# Patient Record
Sex: Male | Born: 1960 | Race: Black or African American | Hispanic: No | Marital: Single | Smoking: Never smoker
Health system: Southern US, Community
[De-identification: ages and names within clinical notes are randomized; demographics above are authoritative.]

## PROBLEM LIST (undated history)

## (undated) DIAGNOSIS — F209 Schizophrenia, unspecified: Secondary | ICD-10-CM

## (undated) DIAGNOSIS — B20 Human immunodeficiency virus [HIV] disease: Secondary | ICD-10-CM

## (undated) DIAGNOSIS — D649 Anemia, unspecified: Secondary | ICD-10-CM

## (undated) DIAGNOSIS — F79 Unspecified intellectual disabilities: Secondary | ICD-10-CM

## (undated) DIAGNOSIS — R569 Unspecified convulsions: Secondary | ICD-10-CM

## (undated) DIAGNOSIS — F259 Schizoaffective disorder, unspecified: Secondary | ICD-10-CM

## (undated) DIAGNOSIS — E870 Hyperosmolality and hypernatremia: Secondary | ICD-10-CM

## (undated) DIAGNOSIS — Z21 Asymptomatic human immunodeficiency virus [HIV] infection status: Secondary | ICD-10-CM

## (undated) DIAGNOSIS — N19 Unspecified kidney failure: Secondary | ICD-10-CM

## (undated) DIAGNOSIS — R739 Hyperglycemia, unspecified: Secondary | ICD-10-CM

## (undated) DIAGNOSIS — F329 Major depressive disorder, single episode, unspecified: Secondary | ICD-10-CM

## (undated) DIAGNOSIS — F319 Bipolar disorder, unspecified: Secondary | ICD-10-CM

## (undated) DIAGNOSIS — T56891A Toxic effect of other metals, accidental (unintentional), initial encounter: Secondary | ICD-10-CM

## (undated) DIAGNOSIS — F419 Anxiety disorder, unspecified: Secondary | ICD-10-CM

## (undated) HISTORY — PX: SKIN BIOPSY: SHX1

## (undated) HISTORY — DX: Hyperglycemia, unspecified: R73.9

## (undated) HISTORY — DX: Unspecified convulsions: R56.9

## (undated) HISTORY — DX: Schizoaffective disorder, unspecified: F25.9

---
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
## 2005-09-16 ENCOUNTER — Ambulatory Visit: Payer: Self-pay | Admitting: Family Medicine

## 2007-11-17 ENCOUNTER — Emergency Department: Payer: Self-pay | Admitting: Emergency Medicine

## 2007-11-17 ENCOUNTER — Other Ambulatory Visit: Payer: Self-pay

## 2008-04-06 ENCOUNTER — Emergency Department: Payer: Self-pay | Admitting: Emergency Medicine

## 2009-05-04 ENCOUNTER — Inpatient Hospital Stay (HOSPITAL_COMMUNITY): Admission: EM | Admit: 2009-05-04 | Discharge: 2009-05-11 | Payer: Self-pay | Admitting: Emergency Medicine

## 2009-05-27 ENCOUNTER — Emergency Department (HOSPITAL_COMMUNITY): Admission: EM | Admit: 2009-05-27 | Discharge: 2009-05-27 | Payer: Self-pay | Admitting: Emergency Medicine

## 2009-07-04 ENCOUNTER — Ambulatory Visit (HOSPITAL_COMMUNITY): Admission: RE | Admit: 2009-07-04 | Discharge: 2009-07-04 | Payer: Self-pay | Admitting: Family Medicine

## 2009-07-23 ENCOUNTER — Emergency Department (HOSPITAL_COMMUNITY): Admission: EM | Admit: 2009-07-23 | Discharge: 2009-07-23 | Payer: Self-pay | Admitting: Emergency Medicine

## 2010-06-25 LAB — DIFFERENTIAL
Basophils Absolute: 0 10*3/uL (ref 0.0–0.1)
Basophils Relative: 0 % (ref 0–1)
Lymphocytes Relative: 16 % (ref 12–46)
Lymphs Abs: 1.2 10*3/uL (ref 0.7–4.0)
Monocytes Relative: 9 % (ref 3–12)
Neutro Abs: 4.8 10*3/uL (ref 1.7–7.7)
Neutro Abs: 9.2 10*3/uL — ABNORMAL HIGH (ref 1.7–7.7)
Neutrophils Relative %: 69 % (ref 43–77)

## 2010-06-25 LAB — T-HELPER CELLS (CD4) COUNT (NOT AT ARMC)
CD4 % Helper T Cell: 26 % — ABNORMAL LOW (ref 33–55)
CD4 T Cell Abs: 530 uL (ref 400–2700)

## 2010-06-25 LAB — BASIC METABOLIC PANEL
Calcium: 8.8 mg/dL (ref 8.4–10.5)
GFR calc Af Amer: >60 mL/min (ref >60)
GFR calc non Af Amer: 48 mL/min — ABNORMAL LOW (ref >60)
GFR calc non Af Amer: >60 mL/min (ref >60)
GFR calc non Af Amer: >60 mL/min (ref >60)
Glucose, Bld: 167 mg/dL — ABNORMAL HIGH (ref 70–99)
Glucose, Bld: 225 mg/dL — ABNORMAL HIGH (ref 70–99)
Potassium: 3.7 mEq/L (ref 3.5–5.1)
Potassium: 3.8 mEq/L (ref 3.5–5.1)
Sodium: 135 mEq/L (ref 135–145)
Sodium: 135 mEq/L (ref 135–145)
Sodium: 136 mEq/L (ref 135–145)

## 2010-06-25 LAB — HEPATIC FUNCTION PANEL
Bilirubin, Direct: 0.2 mg/dL (ref 0.0–0.3)
Indirect Bilirubin: 1.1 mg/dL — ABNORMAL HIGH (ref 0.3–0.9)

## 2010-06-25 LAB — CBC
Hemoglobin: 13 g/dL (ref 13.0–17.0)
MCHC: 33.2 g/dL (ref 30.0–36.0)
Platelets: 140 10*3/uL — ABNORMAL LOW (ref 150–400)
RBC: 3.9 MIL/uL — ABNORMAL LOW (ref 4.22–5.81)
RDW: 13.7 % (ref 11.5–15.5)
WBC: 7 10*3/uL (ref 4.0–10.5)

## 2010-06-25 LAB — LACTATE DEHYDROGENASE: LDH: 147 U/L (ref 94–250)

## 2010-06-25 LAB — PSA: PSA: 0.59 ng/mL (ref 0.10–4.00)

## 2010-06-26 LAB — COMPREHENSIVE METABOLIC PANEL
ALT: 19 U/L (ref 0–53)
Alkaline Phosphatase: 68 U/L (ref 39–117)
BUN: 7 mg/dL (ref 6–23)
CO2: 28 mEq/L (ref 19–32)
Calcium: 8.9 mg/dL (ref 8.4–10.5)
Chloride: 103 mEq/L (ref 96–112)
GFR calc Af Amer: >60 mL/min (ref >60)
GFR calc non Af Amer: 58 mL/min — ABNORMAL LOW (ref >60)
Glucose, Bld: 120 mg/dL — ABNORMAL HIGH (ref 70–99)
Potassium: 3.8 mEq/L (ref 3.5–5.1)
Total Bilirubin: 1.3 mg/dL — ABNORMAL HIGH (ref 0.3–1.2)
Total Protein: 8 g/dL (ref 6.0–8.3)

## 2010-06-26 LAB — DIFFERENTIAL
Basophils Absolute: 0 10*3/uL (ref 0.0–0.1)
Basophils Relative: 0 % (ref 0–1)
Eosinophils Absolute: 0.3 10*3/uL (ref 0.0–0.7)
Lymphocytes Relative: 17 % (ref 12–46)
Monocytes Relative: 10 % (ref 3–12)
Neutro Abs: 5.4 10*3/uL (ref 1.7–7.7)
Neutrophils Relative %: 69 % (ref 43–77)

## 2010-06-26 LAB — CBC
HCT: 37 % — ABNORMAL LOW (ref 39.0–52.0)
Hemoglobin: 12.7 g/dL — ABNORMAL LOW (ref 13.0–17.0)
RBC: 4.07 MIL/uL — ABNORMAL LOW (ref 4.22–5.81)
RDW: 13.7 % (ref 11.5–15.5)

## 2010-06-28 LAB — DIFFERENTIAL
Basophils Absolute: 0 10*3/uL (ref 0.0–0.1)
Basophils Relative: 0 % (ref 0–1)
Lymphocytes Relative: 20 % (ref 12–46)
Monocytes Absolute: 0.8 10*3/uL (ref 0.1–1.0)
Monocytes Relative: 10 % (ref 3–12)
Neutro Abs: 5.3 10*3/uL (ref 1.7–7.7)
Neutrophils Relative %: 66 % (ref 43–77)

## 2010-06-28 LAB — BASIC METABOLIC PANEL
Calcium: 9.2 mg/dL (ref 8.4–10.5)
Creatinine, Ser: 1.2 mg/dL (ref 0.4–1.5)
GFR calc Af Amer: >60 mL/min (ref >60)
GFR calc non Af Amer: >60 mL/min (ref >60)
Sodium: 136 mEq/L (ref 135–145)

## 2010-06-28 LAB — CBC
Hemoglobin: 12.4 g/dL — ABNORMAL LOW (ref 13.0–17.0)
MCHC: 33.2 g/dL (ref 30.0–36.0)
RBC: 4.08 MIL/uL — ABNORMAL LOW (ref 4.22–5.81)
RDW: 13.4 % (ref 11.5–15.5)

## 2011-04-30 ENCOUNTER — Other Ambulatory Visit (HOSPITAL_COMMUNITY): Payer: Self-pay | Admitting: Family Medicine

## 2011-04-30 DIAGNOSIS — I639 Cerebral infarction, unspecified: Secondary | ICD-10-CM

## 2011-05-01 ENCOUNTER — Ambulatory Visit (HOSPITAL_COMMUNITY)
Admission: RE | Admit: 2011-05-01 | Discharge: 2011-05-01 | Disposition: A | Discharge status: Home / Self Care | Payer: PRIVATE HEALTH INSURANCE | Source: Ambulatory Visit | Attending: Family Medicine | Admitting: Family Medicine

## 2011-05-01 DIAGNOSIS — I6350 Cerebral infarction due to unspecified occlusion or stenosis of unspecified cerebral artery: Secondary | ICD-10-CM | POA: Insufficient documentation

## 2011-05-01 DIAGNOSIS — I639 Cerebral infarction, unspecified: Secondary | ICD-10-CM

## 2011-05-05 ENCOUNTER — Emergency Department (HOSPITAL_COMMUNITY): Payer: Medicare Other

## 2011-05-05 ENCOUNTER — Other Ambulatory Visit: Payer: Self-pay

## 2011-05-05 ENCOUNTER — Encounter (HOSPITAL_COMMUNITY): Payer: Self-pay | Admitting: *Deleted

## 2011-05-05 ENCOUNTER — Inpatient Hospital Stay (HOSPITAL_COMMUNITY)
Admission: EM | Admit: 2011-05-05 | Discharge: 2011-05-15 | DRG: 683 | Disposition: A | Discharge status: Nursing Home (Includes ICF) | Payer: Medicare Other | Attending: Family Medicine | Admitting: Family Medicine

## 2011-05-05 DIAGNOSIS — N179 Acute kidney failure, unspecified: Principal | ICD-10-CM | POA: Diagnosis present

## 2011-05-05 DIAGNOSIS — E875 Hyperkalemia: Secondary | ICD-10-CM | POA: Diagnosis present

## 2011-05-05 DIAGNOSIS — R270 Ataxia, unspecified: Secondary | ICD-10-CM

## 2011-05-05 DIAGNOSIS — E870 Hyperosmolality and hypernatremia: Secondary | ICD-10-CM | POA: Diagnosis not present

## 2011-05-05 DIAGNOSIS — N251 Nephrogenic diabetes insipidus: Secondary | ICD-10-CM | POA: Diagnosis not present

## 2011-05-05 DIAGNOSIS — Z21 Asymptomatic human immunodeficiency virus [HIV] infection status: Secondary | ICD-10-CM | POA: Diagnosis present

## 2011-05-05 DIAGNOSIS — F329 Major depressive disorder, single episode, unspecified: Secondary | ICD-10-CM | POA: Diagnosis present

## 2011-05-05 DIAGNOSIS — Z79899 Other long term (current) drug therapy: Secondary | ICD-10-CM

## 2011-05-05 DIAGNOSIS — R4182 Altered mental status, unspecified: Secondary | ICD-10-CM

## 2011-05-05 DIAGNOSIS — E1129 Type 2 diabetes mellitus with other diabetic kidney complication: Secondary | ICD-10-CM | POA: Diagnosis present

## 2011-05-05 DIAGNOSIS — N2581 Secondary hyperparathyroidism of renal origin: Secondary | ICD-10-CM | POA: Diagnosis not present

## 2011-05-05 DIAGNOSIS — F79 Unspecified intellectual disabilities: Secondary | ICD-10-CM | POA: Diagnosis present

## 2011-05-05 DIAGNOSIS — T438X5A Adverse effect of other psychotropic drugs, initial encounter: Secondary | ICD-10-CM | POA: Diagnosis present

## 2011-05-05 DIAGNOSIS — F209 Schizophrenia, unspecified: Secondary | ICD-10-CM | POA: Diagnosis present

## 2011-05-05 DIAGNOSIS — I129 Hypertensive chronic kidney disease with stage 1 through stage 4 chronic kidney disease, or unspecified chronic kidney disease: Secondary | ICD-10-CM | POA: Diagnosis present

## 2011-05-05 DIAGNOSIS — D638 Anemia in other chronic diseases classified elsewhere: Secondary | ICD-10-CM | POA: Diagnosis present

## 2011-05-05 DIAGNOSIS — N19 Unspecified kidney failure: Secondary | ICD-10-CM

## 2011-05-05 DIAGNOSIS — N183 Chronic kidney disease, stage 3 unspecified: Secondary | ICD-10-CM | POA: Diagnosis present

## 2011-05-05 DIAGNOSIS — F319 Bipolar disorder, unspecified: Secondary | ICD-10-CM | POA: Diagnosis present

## 2011-05-05 DIAGNOSIS — I639 Cerebral infarction, unspecified: Secondary | ICD-10-CM

## 2011-05-05 HISTORY — DX: Anxiety disorder, unspecified: F41.9

## 2011-05-05 HISTORY — DX: Anemia, unspecified: D64.9

## 2011-05-05 HISTORY — DX: Asymptomatic human immunodeficiency virus (hiv) infection status: Z21

## 2011-05-05 HISTORY — DX: Bipolar disorder, unspecified: F31.9

## 2011-05-05 HISTORY — DX: Unspecified intellectual disabilities: F79

## 2011-05-05 HISTORY — DX: Schizophrenia, unspecified: F20.9

## 2011-05-05 HISTORY — DX: Major depressive disorder, single episode, unspecified: F32.9

## 2011-05-05 HISTORY — DX: Human immunodeficiency virus (HIV) disease: B20

## 2011-05-05 LAB — URINALYSIS, ROUTINE W REFLEX MICROSCOPIC
Bilirubin Urine: NEGATIVE
Glucose, UA: NEGATIVE mg/dL
Ketones, ur: NEGATIVE mg/dL
Protein, ur: 30 mg/dL — AB
pH: 7 (ref 5.0–8.0)

## 2011-05-05 LAB — URINE MICROSCOPIC-ADD ON

## 2011-05-05 LAB — DIFFERENTIAL
Basophils Relative: 0 % (ref 0–1)
Eosinophils Absolute: 0.3 10*3/uL (ref 0.0–0.7)
Eosinophils Relative: 2 % (ref 0–5)
Lymphs Abs: 1.5 10*3/uL (ref 0.7–4.0)
Monocytes Relative: 8 % (ref 3–12)
Neutrophils Relative %: 77 % (ref 43–77)

## 2011-05-05 LAB — HEPATIC FUNCTION PANEL
ALT: 10 U/L (ref 0–53)
AST: 10 U/L (ref 0–37)
Albumin: 3.4 g/dL — ABNORMAL LOW (ref 3.5–5.2)
Alkaline Phosphatase: 83 U/L (ref 39–117)
Bilirubin, Direct: 0.1 mg/dL (ref 0.0–0.3)
Indirect Bilirubin: 0.3 mg/dL (ref 0.3–0.9)
Total Bilirubin: 0.4 mg/dL (ref 0.3–1.2)
Total Protein: 8.2 g/dL (ref 6.0–8.3)

## 2011-05-05 LAB — CBC
MCH: 30.3 pg (ref 26.0–34.0)
MCHC: 31.7 g/dL (ref 30.0–36.0)
MCV: 95.8 fL (ref 78.0–100.0)
Platelets: 197 10*3/uL (ref 150–400)
RBC: 3.33 MIL/uL — ABNORMAL LOW (ref 4.22–5.81)

## 2011-05-05 LAB — BASIC METABOLIC PANEL
BUN: 58 mg/dL — ABNORMAL HIGH (ref 6–23)
Calcium: 8.8 mg/dL (ref 8.4–10.5)
GFR calc Af Amer: 9 mL/min — ABNORMAL LOW (ref >90)
GFR calc non Af Amer: 8 mL/min — ABNORMAL LOW (ref >90)
Glucose, Bld: 91 mg/dL (ref 70–99)
Potassium: 5.1 mEq/L (ref 3.5–5.1)
Sodium: 137 mEq/L (ref 135–145)

## 2011-05-05 LAB — PHOSPHORUS: Phosphorus: 4.6 mg/dL (ref 2.3–4.6)

## 2011-05-05 MED ORDER — ENOXAPARIN SODIUM 30 MG/0.3ML ~~LOC~~ SOLN
30.0000 mg | SUBCUTANEOUS | Status: DC
  Administered 2011-05-05 – 2011-05-11 (×7): 30 mg via SUBCUTANEOUS
  Filled 2011-05-05 (×7): qty 0.3

## 2011-05-05 MED ORDER — SODIUM CHLORIDE 0.9 % IV SOLN
INTRAVENOUS | Status: DC
  Administered 2011-05-05 – 2011-05-06 (×2): via INTRAVENOUS

## 2011-05-05 MED ORDER — EMTRICITABINE-TENOFOVIR DF 200-300 MG PO TABS
1.0000 | ORAL_TABLET | Freq: Every day | ORAL | Status: DC
  Administered 2011-05-05 – 2011-05-15 (×11): 1 via ORAL
  Filled 2011-05-05 (×13): qty 1

## 2011-05-05 MED ORDER — DIVALPROEX SODIUM ER 500 MG PO TB24
1000.0000 mg | ORAL_TABLET | Freq: Every day | ORAL | Status: DC
  Administered 2011-05-05: 1000 mg via ORAL
  Filled 2011-05-05 (×2): qty 2

## 2011-05-05 MED ORDER — OLANZAPINE 5 MG PO TABS
20.0000 mg | ORAL_TABLET | Freq: Every day | ORAL | Status: DC
  Administered 2011-05-05 – 2011-05-14 (×10): 20 mg via ORAL
  Filled 2011-05-05 (×7): qty 4
  Filled 2011-05-05: qty 2
  Filled 2011-05-05 (×2): qty 4
  Filled 2011-05-05: qty 2

## 2011-05-05 MED ORDER — ACETAMINOPHEN 325 MG PO TABS: 650.0000 mg | ORAL_TABLET | Freq: Four times a day (QID) | ORAL | Status: DC | PRN

## 2011-05-05 MED ORDER — SODIUM CHLORIDE 0.9 % IV SOLN
Freq: Once | INTRAVENOUS | Status: AC
  Administered 2011-05-05: 2000 mL via INTRAVENOUS

## 2011-05-05 MED ORDER — LITHIUM CARBONATE ER 450 MG PO TBCR: 450.0000 mg | EXTENDED_RELEASE_TABLET | Freq: Two times a day (BID) | ORAL | Status: DC

## 2011-05-05 MED ORDER — FAMOTIDINE 20 MG PO TABS
20.0000 mg | ORAL_TABLET | Freq: Every day | ORAL | Status: DC
  Administered 2011-05-05 – 2011-05-15 (×11): 20 mg via ORAL
  Filled 2011-05-05 (×11): qty 1

## 2011-05-05 MED ORDER — CLONAZEPAM 0.5 MG PO TABS
2.0000 mg | ORAL_TABLET | Freq: Every day | ORAL | Status: DC
  Administered 2011-05-05 – 2011-05-14 (×10): 2 mg via ORAL
  Filled 2011-05-05 (×10): qty 4

## 2011-05-05 MED ORDER — ONDANSETRON HCL 4 MG PO TABS: 4.0000 mg | ORAL_TABLET | Freq: Four times a day (QID) | ORAL | Status: DC | PRN

## 2011-05-05 MED ORDER — ONDANSETRON HCL 4 MG/2ML IJ SOLN: 4.0000 mg | Freq: Four times a day (QID) | INTRAMUSCULAR | Status: DC | PRN

## 2011-05-05 MED ORDER — ACETAMINOPHEN 650 MG RE SUPP: 650.0000 mg | Freq: Four times a day (QID) | RECTAL | Status: DC | PRN

## 2011-05-05 MED ORDER — DIVALPROEX SODIUM ER 500 MG PO TB24
500.0000 mg | ORAL_TABLET | Freq: Every day | ORAL | Status: DC
  Administered 2011-05-05 – 2011-05-15 (×11): 500 mg via ORAL
  Filled 2011-05-05 (×13): qty 1

## 2011-05-05 MED ORDER — SODIUM CHLORIDE 0.9 % IV SOLN
Freq: Once | INTRAVENOUS | Status: AC
  Administered 2011-05-05: 10:00:00 via INTRAVENOUS

## 2011-05-05 MED ORDER — ALUM & MAG HYDROXIDE-SIMETH 200-200-20 MG/5ML PO SUSP: 30.0000 mL | Freq: Four times a day (QID) | ORAL | Status: DC | PRN

## 2011-05-05 MED ORDER — TAMSULOSIN HCL 0.4 MG PO CAPS
0.4000 mg | ORAL_CAPSULE | Freq: Every day | ORAL | Status: DC
  Administered 2011-05-05 – 2011-05-15 (×11): 0.4 mg via ORAL
  Filled 2011-05-05 (×11): qty 1

## 2011-05-05 MED ORDER — RITONAVIR 100 MG PO TABS
100.0000 mg | ORAL_TABLET | Freq: Every day | ORAL | Status: DC
  Administered 2011-05-06 – 2011-05-15 (×10): 100 mg via ORAL
  Filled 2011-05-05 (×12): qty 1

## 2011-05-05 MED ORDER — ATAZANAVIR SULFATE 150 MG PO CAPS
300.0000 mg | ORAL_CAPSULE | Freq: Every day | ORAL | Status: DC
  Administered 2011-05-06 – 2011-05-15 (×10): 300 mg via ORAL
  Filled 2011-05-05 (×14): qty 2

## 2011-05-05 MED ORDER — DIVALPROEX SODIUM ER 500 MG PO TB24: 500.0000 mg | ORAL_TABLET | Freq: Two times a day (BID) | ORAL | Status: DC

## 2011-05-05 NOTE — H&P (Signed)
[name], [name]             ACCOUNT NO.:  [id number]  MEDICAL RECORD NO.:  [id number]  LOCATION:  A331                          FACILITY:  [facility]  PHYSICIAN:  Kingsley Callander. Ouida Sills, MD       DATE OF BIRTH:  04-27-1960  DATE OF ADMISSION:  05/05/2011 DATE OF DISCHARGE:  LH                             HISTORY & PHYSICAL   HISTORY OF PRESENT ILLNESS:  This patient is a 51 year old, African American male, who presented to the emergency room after he had developed increased difficulty speaking and increased difficulty walking over the past 2 days.  He had reportedly had a right facial droop.  He was initially evaluated in the emergency room and underwent a CT scan of the brain which revealed no evidence of acute infarct.  The patient had difficulty providing any meaningful history.  He has a history of HIV as well as mental retardation, and 3 listed other psychiatric diagnoses here in the chart of schizophrenia, bipolar disorder, and depression. He was found to be on lithium and Depakote.  Levels were obtained revealing a toxic lithium level of 2.47 and a Depakote level of 49.5. He was initially hypotensive with systolic pressures in the 80s, but has responded with IV hydration with normal saline to a systolic pressure in the 130 range.  He was also found to be in kidney failure with a BUN and creatinine of 58 and 7.36, compared to a BUN and creatinine of 7 and 1.3 last April.  He had undergone Foley catheterization at which time, 450 mL of urine was drained.  PAST MEDICAL HISTORY: 1. HIV. 2. Mental retardation. 3. Anemia. 4. Schizophrenia, depression, and bipolar disorder listed.  MEDICATIONS: 1. Reyataz 300 mg daily. 2. Klonopin 2 mg at bedtime. 3. Depakote ER 500 mg q.a.m. and 1000 mg at bedtime. 4. Truvada 200-300 mg tablet daily. 5. Ferrous sulfate 325 mg t.i.d. 6. Gabapentin 600 mg b.i.d. 7. Lisinopril 10 mg daily. 8. Lithium carbonate 450 mg b.i.d. 9. Zyprexa 20 mg at  bedtime. 10.Ranitidine 150 mg b.i.d. 11.Norvir 100 mg daily. 12.Tamsulosin 0.4 mg daily.  ALLERGIES:  None known.  SOCIAL HISTORY:  Tobacco, alcohol, and drug use denied.  FAMILY HISTORY:  Unknown.  REVIEW OF SYSTEMS:  Unobtainable.  PHYSICAL EXAMINATION:  VITAL SIGNS:  Temperature 98.6, pulse 84, respirations 25, blood pressure 134/59, after first blood pressure of 81/42, oxygen saturation 100% on room air. GENERAL:  Sluggish male in no distress. HEENT:  Eyes, nose, and pharynx are unremarkable.  Mucous membranes are moist. NECK:  No JVD or thyromegaly. LUNGS:  Clear. HEART:  Regular with no murmurs. ABDOMEN:  Soft, nontender with no hepatosplenomegaly. EXTREMITIES:  No cyanosis, clubbing, or edema. NEUROLOGIC:  He is able to move all 4 extremities and has no focal weakness.  His face is symmetric.  His speech is slowed, although his baseline status is not known. SKIN:  Warm and dry. LYMPH NODES:  No cervical or supraclavicular enlargement.  LABORATORY DATA:  Sodium 137, potassium 5.1, bicarb 25, BUN 58, creatinine 7.36, calcium 8.8, glucose 91, phosphorus 4.6, magnesium 3.3, albumin 3.4, AST 10, ALT 10, ammonia 38, lactic acid 1.1.  White count 11.9, hemoglobin 10.1,  platelets 197,000.  Lithium level 2.47.  Valproic acid level 49.5.  Urinalysis reveals a pH of 1.010 with 30 mg/dL of protein.  Chest x-ray reveals bilateral hazy lung opacities.  CT scan of the head reveals no acute abnormality with left frontal sinus opacification and comparison is available from May 01, 2011.  No changes noted.  IMPRESSION AND PLAN: 1. Lithium toxicity.  He will be hospitalized in a monitored setting.     Lithium will be held.  A repeat lithium level will be obtained     tomorrow. 2. Kidney failure.  A renal ultrasound will be obtained.  Lisinopril     will be held. 3. Normocytic anemia. 4. Human immunodeficiency virus. 5. Mental retardation. 6. Schizophrenia versus bipolar  disorder. 7. Abnormal chest x-ray.  He is not dyspneic or short of breath.  We     will follow closely with hydration. 8. We will also hold ferrous sulfate and gabapentin along with the     lisinopril for now.  A Foley catheter is in place to monitor his     urine output, which has thus far been good.  Holding his lithium     dose, he will have improvement in his neurological status, and in     his renal function.  Further evaluation tomorrow with his personal     physician, Dr. Janna Arch.     Kingsley Callander. Ouida Sills, MD     ROF/MEDQ  D:  05/05/2011  T:  05/05/2011  Job:  [id number]

## 2011-05-05 NOTE — ED Notes (Signed)
Report given to [facility] RN unit 300 ready to receive patient.

## 2011-05-05 NOTE — ED Provider Notes (Signed)
History     CSN: [id number]  Arrival date & time 05/05/11  5956   First MD Initiated Contact with Patient 05/05/11 [phone]      Chief Complaint  Patient presents with  . possible stroke     (Consider location/radiation/quality/duration/timing/severity/associated sxs/prior treatment) HPI Comments: Per the LPN caregiver that accompanied from the assisted livingcenter where he lives, he is having a difficult time speaking and walking.  Staff attempted getting him to the ED yest without success.  No head trauma.  He has multiple psych disorders.  He was reportedly last seen normal yest AM or as long ago as 6 days ago.  PCP= dr Janna Arch  The history is provided by a caregiver. No language interpreter was used.    Past Medical History  Diagnosis Date  . HIV (human immunodeficiency virus infection)   . Schizophrenia   . MR (mental retardation)   . Anemia   . Anxiety   . Bipolar 1 disorder   . Depression, major     History reviewed. No pertinent past surgical history.  History reviewed. No pertinent family history.  History  Substance Use Topics  . Smoking status: Former Games developer  . Smokeless tobacco: Not on file  . Alcohol Use: No      Review of Systems  Neurological: Positive for speech difficulty. Negative for seizures and headaches.       Has difficulty ambulating.    Allergies  Review of patient's allergies indicates no known allergies.  Home Medications  No current outpatient prescriptions on file.  BP 92/52  Pulse 72  Temp(Src) 98.6 F (37 C) (Oral)  Resp 22  SpO2 97%  Physical Exam  Nursing note and vitals reviewed. Constitutional: He is oriented to person, place, and time. He appears well-developed and well-nourished.  HENT:  Head: Normocephalic and atraumatic.    Right Ear: External ear normal.  Left Ear: External ear normal.  Nose: Nose normal.  Mouth/Throat: No oropharyngeal exudate.  Eyes: EOM are normal. Pupils are equal, round, and reactive  to light. Right eye exhibits no discharge. Left eye exhibits no discharge. No scleral icterus.  Neck: Normal range of motion.  Cardiovascular: Normal rate, regular rhythm, normal heart sounds and intact distal pulses.  PMI is not displaced.   Pulmonary/Chest: Effort normal and breath sounds normal. No accessory muscle usage. Not tachypneic. No respiratory distress. He has no decreased breath sounds. He has no wheezes. He has no rhonchi. He has no rales.  Abdominal: Soft. Normal appearance and bowel sounds are normal. He exhibits no distension. There is no tenderness. There is no CVA tenderness.  Musculoskeletal: Normal range of motion.  Neurological: He is alert and oriented to person, place, and time. He has normal strength. Coordination abnormal. GCS eye subscore is 3. GCS verbal subscore is 4. GCS motor subscore is 6.  Reflex Scores:      Bicep reflexes are 2+ on the right side and 2+ on the left side.      Brachioradialis reflexes are 2+ on the right side and 2+ on the left side.      Patellar reflexes are 2+ on the right side and 2+ on the left side.      Achilles reflexes are 2+ on the right side and 2+ on the left side. Skin: Skin is warm and dry.  Psychiatric: He has a normal mood and affect. Judgment normal.    ED Course  Procedures (including critical care time)  Labs Reviewed - No data to  display No results found.   No diagnosis found.    MDM    Date: 05/05/2011  Rate: 68  Rhythm: normal sinus rhythm  QRS Axis: normal  Intervals: PR prolonged  ST/T Wave abnormalities: normal  Conduction Disutrbances:first-degree A-V block   Narrative Interpretation:   Old EKG Reviewed: none available       1220-spoke with dr. Ouida Sills.  Will come  See admit.  Worthy Rancher, PA 05/05/11 1230

## 2011-05-05 NOTE — ED Notes (Signed)
Patient given water and coke per request. Passed swallow screen. Dr Ouida Sills currently at bedside for patient evaluation.

## 2011-05-05 NOTE — ED Notes (Signed)
Pts care giver states pt has been slurring his speech and walking unsteady since Wednesday. Pts right side lip drooping. Caregiver states mental status not normal.

## 2011-05-05 NOTE — ED Notes (Signed)
Patient lying in bed with eyes closed. Respirations even and unlabored. Patient arouses to voice.

## 2011-05-05 NOTE — ED Notes (Signed)
CRITICAL VALUE ALERT  Critical value received:  Lithium level 2.47  Date of notification: 05/05/2011  Time of notification:  0438  Critical value read back:yes  Nurse who received alert:  Tarri Glenn RN  MD notified (1st page):  Dr Ouida Sills  Time of first page:  1443  MD notified (2nd page):  Time of second page:  Responding MD:  Dr Ouida Sills  Time MD responded:  [phone]

## 2011-05-06 ENCOUNTER — Inpatient Hospital Stay (HOSPITAL_COMMUNITY): Payer: Medicare Other

## 2011-05-06 ENCOUNTER — Other Ambulatory Visit (HOSPITAL_COMMUNITY): Payer: Self-pay

## 2011-05-06 LAB — RETICULOCYTES
RBC.: 3.19 MIL/uL — ABNORMAL LOW (ref 4.22–5.81)
Retic Count, Absolute: 41.5 10*3/uL (ref 19.0–186.0)
Retic Ct Pct: 1.3 % (ref 0.4–3.1)

## 2011-05-06 LAB — COMPREHENSIVE METABOLIC PANEL
BUN: 45 mg/dL — ABNORMAL HIGH (ref 6–23)
CO2: 22 mEq/L (ref 19–32)
Calcium: 8.4 mg/dL (ref 8.4–10.5)
Chloride: 118 mEq/L — ABNORMAL HIGH (ref 96–112)
Creatinine, Ser: 6.02 mg/dL — ABNORMAL HIGH (ref 0.50–1.35)
GFR calc Af Amer: 11 mL/min — ABNORMAL LOW (ref >90)
GFR calc non Af Amer: 10 mL/min — ABNORMAL LOW (ref >90)
Total Bilirubin: 0.3 mg/dL (ref 0.3–1.2)

## 2011-05-06 LAB — AMMONIA: Ammonia: 56 umol/L (ref 11–60)

## 2011-05-06 LAB — GLUCOSE, CAPILLARY
Glucose-Capillary: 84 mg/dL (ref 70–99)
Glucose-Capillary: 107 mg/dL — ABNORMAL HIGH (ref 70–99)

## 2011-05-06 MED ORDER — SODIUM CHLORIDE 0.9 % IJ SOLN
INTRAMUSCULAR | Status: AC
  Filled 2011-05-06: qty 3

## 2011-05-06 MED ORDER — SODIUM CHLORIDE 0.9 % IV SOLN: INTRAVENOUS | Status: DC

## 2011-05-06 MED ORDER — SODIUM CHLORIDE 0.45 % IV SOLN
INTRAVENOUS | Status: DC
  Administered 2011-05-06: 18:00:00 via INTRAVENOUS
  Filled 2011-05-06 (×4): qty 50

## 2011-05-06 NOTE — Progress Notes (Signed)
CRITICAL VALUE ALERT  Critical value received:  Lithium 2.22  Date of notification:  05/06/11  Time of notification:  0625  Critical value read back: yes  Nurse who received alert:  Foye Deer RN  MD notified (1st page):  Dr. Ouida Sills  Time of first page:  0626  MD notified (2nd page):  Time of second page:  Responding MD:  Dr. Ouida Sills  Time MD responded:  [phone]  Lab value going down - no new orders

## 2011-05-06 NOTE — Consult Note (Signed)
Reason for Consult: Acute kidney injury Referring Physician: Dr. Hillery Aldo Nathan [name] is an 51 y.o. male.  HPI: Patient with  history of schizophrenia, bipolar disorder major depression and HIV positive presently had came to the hospital because of  change in mental status. At this moment patient is awake he doesn't offer no complaints. presently very difficult to assess patient because the no family member is available and not sure what his baseline mental status is. But from the documentation patient also seems to have some sort of mental retardation. Hence this could be possibly would multifactorial  Past Medical History  Diagnosis Date  . HIV (human immunodeficiency virus infection)   . Schizophrenia   . MR (mental retardation)   . Anemia   . Anxiety   . Bipolar 1 disorder   . Depression, major     History reviewed. No pertinent past surgical history.  History reviewed. No pertinent family history.  Social History:  reports that he has quit smoking. He does not have any smokeless tobacco history on file. He reports that he does not drink alcohol or use illicit drugs.  Allergies: No Known Allergies  Medications: I have reviewed the patient's current medications.  Results for orders placed during the hospital encounter of 05/05/11 (from the past 48 hour(s))  CBC     Status: Abnormal   Collection Time   05/05/11  9:54 AM      Component Value Range Comment   WBC 11.9 (*) 4.0 - 10.5 (K/uL)    RBC 3.33 (*) 4.22 - 5.81 (MIL/uL)    Hemoglobin 10.1 (*) 13.0 - 17.0 (g/dL)    HCT 08.6 (*) 57.8 - 52.0 (%)    MCV 95.8  78.0 - 100.0 (fL)    MCH 30.3  26.0 - 34.0 (pg)    MCHC 31.7  30.0 - 36.0 (g/dL)    RDW 46.9  62.9 - 52.8 (%)    Platelets 197  150 - 400 (K/uL)   DIFFERENTIAL     Status: Abnormal   Collection Time   05/05/11  9:54 AM      Component Value Range Comment   Neutrophils Relative 77  43 - 77 (%)    Neutro Abs 9.2 (*) 1.7 - 7.7 (K/uL)    Lymphocytes Relative 13  12  - 46 (%)    Lymphs Abs 1.5  0.7 - 4.0 (K/uL)    Monocytes Relative 8  3 - 12 (%)    Monocytes Absolute 0.9  0.1 - 1.0 (K/uL)    Eosinophils Relative 2  0 - 5 (%)    Eosinophils Absolute 0.3  0.0 - 0.7 (K/uL)    Basophils Relative 0  0 - 1 (%)    Basophils Absolute 0.0  0.0 - 0.1 (K/uL)   BASIC METABOLIC PANEL     Status: Abnormal   Collection Time   05/05/11  9:54 AM      Component Value Range Comment   Sodium 137  135 - 145 (mEq/L)    Potassium 5.1  3.5 - 5.1 (mEq/L)    Chloride 107  96 - 112 (mEq/L)    CO2 25  19 - 32 (mEq/L)    Glucose, Bld 91  70 - 99 (mg/dL)    BUN 58 (*) 6 - 23 (mg/dL)    Creatinine, Ser 4.13 (*) 0.50 - 1.35 (mg/dL)    Calcium 8.8  8.4 - 10.5 (mg/dL)    GFR calc non Af Amer 8 (*) >90 (mL/min)  GFR calc Af Amer 9 (*) >90 (mL/min)   PHOSPHORUS     Status: Normal   Collection Time   05/05/11  9:54 AM      Component Value Range Comment   Phosphorus 4.6  2.3 - 4.6 (mg/dL)   MAGNESIUM     Status: Abnormal   Collection Time   05/05/11  9:54 AM      Component Value Range Comment   Magnesium 3.3 (*) 1.5 - 2.5 (mg/dL)   VALPROIC ACID LEVEL     Status: Abnormal   Collection Time   05/05/11  9:54 AM      Component Value Range Comment   Valproic Acid Lvl 49.5 (*) 50.0 - 100.0 (ug/mL)   HEPATIC FUNCTION PANEL     Status: Abnormal   Collection Time   05/05/11  9:54 AM      Component Value Range Comment   Total Protein 8.2  6.0 - 8.3 (g/dL)    Albumin 3.4 (*) 3.5 - 5.2 (g/dL)    AST 10  0 - 37 (U/L)    ALT 10  0 - 53 (U/L)    Alkaline Phosphatase 83  39 - 117 (U/L)    Total Bilirubin 0.4  0.3 - 1.2 (mg/dL)    Bilirubin, Direct 0.1  0.0 - 0.3 (mg/dL) REPEATED TO VERIFY   Indirect Bilirubin 0.3  0.3 - 0.9 (mg/dL)   LACTIC ACID, PLASMA     Status: Normal   Collection Time   05/05/11 10:00 AM      Component Value Range Comment   Lactic Acid, Venous 1.1  0.5 - 2.2 (mmol/L)   AMMONIA     Status: Normal   Collection Time   05/05/11 10:05 AM      Component Value  Range Comment   Ammonia 38  11 - 60 (umol/L)   URINALYSIS, ROUTINE W REFLEX MICROSCOPIC     Status: Abnormal   Collection Time   05/05/11  1:09 PM      Component Value Range Comment   Color, Urine STRAW (*) YELLOW     APPearance CLEAR  CLEAR     Specific Gravity, Urine 1.010  1.005 - 1.030     pH 7.0  5.0 - 8.0     Glucose, UA NEGATIVE  NEGATIVE (mg/dL)    Hgb urine dipstick SMALL (*) NEGATIVE     Bilirubin Urine NEGATIVE  NEGATIVE     Ketones, ur NEGATIVE  NEGATIVE (mg/dL)    Protein, ur 30 (*) NEGATIVE (mg/dL)    Urobilinogen, UA 0.2  0.0 - 1.0 (mg/dL)    Nitrite NEGATIVE  NEGATIVE     Leukocytes, UA NEGATIVE  NEGATIVE    URINE MICROSCOPIC-ADD ON     Status: Normal   Collection Time   05/05/11  1:09 PM      Component Value Range Comment   WBC, UA 0-2  <3 (WBC/hpf)    RBC / HPF 0-2  <3 (RBC/hpf)   LITHIUM LEVEL     Status: Abnormal   Collection Time   05/05/11  1:48 PM      Component Value Range Comment   Lithium Lvl 2.47 (*) 0.80 - 1.40 (mEq/L)   MRSA PCR SCREENING     Status: Normal   Collection Time   05/05/11  5:00 PM      Component Value Range Comment   MRSA by PCR NEGATIVE  NEGATIVE    LITHIUM LEVEL     Status: Abnormal   Collection Time  05/06/11  5:23 AM      Component Value Range Comment   Lithium Lvl 2.22 (*) 0.80 - 1.40 (mEq/L)   COMPREHENSIVE METABOLIC PANEL     Status: Abnormal   Collection Time   05/06/11  9:31 AM      Component Value Range Comment   Sodium 144  135 - 145 (mEq/L) DELTA CHECK NOTED   Potassium 5.4 (*) 3.5 - 5.1 (mEq/L)    Chloride 118 (*) 96 - 112 (mEq/L)    CO2 22  19 - 32 (mEq/L)    Glucose, Bld 90  70 - 99 (mg/dL)    BUN 45 (*) 6 - 23 (mg/dL)    Creatinine, Ser 4.54 (*) 0.50 - 1.35 (mg/dL)    Calcium 8.4  8.4 - 10.5 (mg/dL)    Total Protein 7.0  6.0 - 8.3 (g/dL)    Albumin 2.8 (*) 3.5 - 5.2 (g/dL)    AST 9  0 - 37 (U/L)    ALT 8  0 - 53 (U/L)    Alkaline Phosphatase 75  39 - 117 (U/L)    Total Bilirubin 0.3  0.3 - 1.2 (mg/dL)     GFR calc non Af Amer 10 (*) >90 (mL/min)    GFR calc Af Amer 11 (*) >90 (mL/min)   AMMONIA     Status: Normal   Collection Time   05/06/11  2:27 PM      Component Value Range Comment   Ammonia 56  11 - 60 (umol/L)   RETICULOCYTES     Status: Abnormal   Collection Time   05/06/11  2:27 PM      Component Value Range Comment   Retic Ct Pct 1.3  0.4 - 3.1 (%)    RBC. 3.19 (*) 4.22 - 5.81 (MIL/uL)    Retic Count, Manual 41.5  19.0 - 186.0 (K/uL)     X-ray Chest Pa And Lateral   05/05/2011  *RADIOLOGY REPORT*  Clinical Data: Renal failure  CHEST - 2 VIEW  Comparison: 07/04/2009  Findings: Heart size is normal.  There is no pleural effusion identified.  Bilateral hazy lung opacities are identified.  The visualized osseous structures appear unremarkable.  IMPRESSION:  1.  Bilateral hazy lung opacities.  In the setting of renal failure this likely represents edema.  Original Report Authenticated By: Rosealee Albee, M.D.   Ct Head Wo Contrast  05/05/2011  *RADIOLOGY REPORT*  Clinical Data: Altered level of consciousness, dysphagia,  CT HEAD WITHOUT CONTRAST  Technique:  Contiguous axial images were obtained from the base of the skull through the vertex without contrast.  Comparison: 05/01/2011  Findings:  The gray-white differentiation is maintained.  No CT evidence of acute large territory infarct.  No intraparenchymal or extra-axial mass or hemorrhage.  Unchanged size and configuration of the ventricles and basilar cisterns.  There is unchanged calcification about the posterior midline falx.  There is minimal mucosal thickening within the right maxillary sinus. Unchanged opacification of the left frontal sinus.  The remaining paranasal sinuses and mastoid air cells are patent.  Regional soft tissues are normal.  Normal appearance of the orbits.  IMPRESSION: 1.  No acute intracranial disease.  2.  Left frontal sinus opacification.  Original Report Authenticated By: Waynard Reeds, M.D.   US  Renal  05/06/2011  *RADIOLOGY REPORT*  Clinical Data: 51 year old male with renal failure.  RENAL/URINARY TRACT ULTRASOUND COMPLETE  Comparison:  None  Findings:  Right Kidney:  The right kidney is  upper limits of normal in echogenicity measuring 11.7 cm.  There is no evidence of hydronephrosis, solid renal mass or definite renal calculi.  Left Kidney:  The left kidney is upper limits of normal in echogenicity measuring 10.3 cm.  There is no evidence of hydronephrosis, solid renal mass or definite renal calculi.  Bladder:  The bladder is collapsed and contains a Foley catheter.  IMPRESSION: Upper limits of normal renal echogenicity which may reflect medical renal disease.  No evidence of hydronephrosis.  Foley catheter within bladder.  Original Report Authenticated By: Rosendo Gros, M.D.    Review of Systems  Unable to perform ROS: psychiatric disorder   Blood pressure 104/69, pulse 71, temperature 98.6 F (37 C), temperature source Oral, resp. rate 18, height 5\' 9"  (1.753 m), weight 92.4 kg (203 lb 11.3 oz), SpO2 96.00%. Physical Exam  Eyes: No scleral icterus.  Neck: No JVD present.  Cardiovascular: Normal rate and regular rhythm.   No murmur heard. Respiratory: No stridor. He has no wheezes. He has no rales.  GI: There is no tenderness. There is no rebound.  Musculoskeletal: He exhibits no edema.    Assessment/Plan: Problem #1 acute kidney injury at this moment seems to be multifactorial including prerenal syndrome , secondary to his HIV medication and also her lithium level. Presently patient is none oliguric and pending creatinine seems to be improving. Problem #2 lithium  toxicity level seems to be improving presently is 2.2. At this moment is very difficult to see if patient has any sign of her lithium toxicity such as nystagmus. Problem #3 history of schizophrenia Problem #4 history of HIV positive is on antiviral medication Problem #5 hyperkalemia Problem #6 history of mental  retardation Problem #7 history of bipolar disorder Problem #8 history of depression major. Problem #9 anemia. Plan: We'll change his IV fluid to normal saline was sodium bicarbonate and will contact 135 cc per/h            We'll use Lasix to improve his urine output and also help in controlling his potassium if his urine output declines.             We'll check basic metabolic panel and lithium level in the morning.             We'll check phosphorus and also iron studies in the morning.  Nathan [name] S 05/06/2011, 4:24 PM

## 2011-05-06 NOTE — Progress Notes (Signed)
[id number] 

## 2011-05-06 NOTE — Progress Notes (Signed)
UR Chart Review Completed  

## 2011-05-06 NOTE — ED Provider Notes (Addendum)
Medical screening examination/treatment/procedure(s) were performed by non-physician practitioner and as a shared encounter.  I have seen and evaluated the patient.  This 51 year old male with multiple medical problems, including HIV, mental retardation, bipolar disease now presents with altered mental status.  It is unclear when the patient was last seen normal, possibly yesterday, or as long ago as last week.  On exam the patient is in no distress but he has new neurologic deficits.  Soon after arrival the patient was also noted to be hypotensive.  Given these findings, there was concern for acute stroke.  The patient's radiographically to suggest acute stroke in his initial labs were notable for new renal failure.  During resuscitation with IV fluids patient was also noted to have a toxic lithium level.  The patient's blood pressure remained marginal, though stable during his ED stay.  Given the patient's hypotension, renal failure, lithium toxicity he was admitted for further evaluation and management.  Gerhard Munch, MD 05/06/11 0719  XR, CT reviewed by me.  (c/w renal dysfunction) - abnormal, though no acute cva.   Date: 05/06/2011  Rate: 68  Rhythm: normal sinus rhythm  QRS Axis: normal  Intervals: PR prolonged  ST/T Wave abnormalities: nonspecific T wave changes  Conduction Disutrbances:first-degree A-V block   Narrative Interpretation:   Old EKG Reviewed: none available ABNORMAL  Pulse Ox 95% 2l [place], abnormal Cardiac: 70 sr, normal  CRITICAL CARE Performed by: Gerhard Munch   Total critical care time: 35  Critical care time was exclusive of separately billable procedures and treating other patients.  Critical care was necessary to treat or prevent imminent or life-threatening deterioration.  Critical care was time spent personally by me on the following activities: development of treatment plan with patient and/or surrogate as well as nursing, discussions with  consultants, evaluation of patient's response to treatment, examination of patient, obtaining history from patient or surrogate, ordering and performing treatments and interventions, ordering and review of laboratory studies, ordering and review of radiographic studies, pulse oximetry and re-evaluation of patient's condition.    Gerhard Munch, MD 05/06/11 0730

## 2011-05-06 NOTE — Progress Notes (Signed)
Nathan [name], Nathan [name]             ACCOUNT NO.:  [id number]  MEDICAL RECORD NO.:  [id number]  LOCATION:  A331                          FACILITY:  [facility]  PHYSICIAN:  [name], MDDATE OF BIRTH:  [birth year]  DATE OF PROCEDURE: DATE OF DISCHARGE:                                PROGRESS NOTE   The patient is a 51 year old black male with HIV, cognitive dysfunction with some lithium toxicity secondary to acute renal failure since the last year, presumably due to some new onset diabetes likewise with the administration of metformin, which was stopped for 3 or 4 days prior to admission.  The patient is admitted.  His lithium has been held, and renal workup has been ensued.  His creatinine has improved from 7.3 to 6.02.  Potassium is 5.4.  Renal ultrasound reveals upper limits of normal echogenicity.  CT of the head with, contrast shows no evidence of CVA.  PHYSICAL EXAMINATION:  VITAL SIGNS:  Blood pressure at present is 95/56, temperature is 98.2, pulse is 67 and regular, respiratory rate is 18. NECK:  No JVD. LUNGS:  Diminished breath sounds at bases.  No rales, wheeze, or rhonchi appreciable. HEART:  Regular rhythm.  No murmurs, gallops, or rubs.  WBC is 11.9, hemoglobin 10.1, BUN 45, creatinine 6.0, potassium 5.4.  The plan right now is to repeat valproic acid level in a.m., give aggressive IV hydration in the form of normal saline.  Monitor renal function daily, obtain renal consult.  We will do a.c. and h.s. glucoses for glycemic control with NovoLog sliding scale.  If elevated, reinstitute lithium and/or ferrous sulfate.  If the clinical situation improves, continue with cessation of metformin as well as lisinopril. Hopefully, part of this renal function is reversible with aggressive fluid hydration.  We will defer to renal expertise in this area.     [name], MD     RMD/MEDQ  D:  05/06/2011  T:  05/06/2011  Job:  [id number]

## 2011-05-06 NOTE — Progress Notes (Signed)
CSW assessed pt as he is admitted from [facility].  Plan is for pt to return there at d/c.  Pt's uncle's contact information added to chart.  CSW to continue to follow.  Full assessment on shadow chart.  Karn Cassis

## 2011-05-07 LAB — PHOSPHORUS: Phosphorus: 4.2 mg/dL (ref 2.3–4.6)

## 2011-05-07 LAB — HEPATIC FUNCTION PANEL
Albumin: 3.2 g/dL — ABNORMAL LOW (ref 3.5–5.2)
Alkaline Phosphatase: 88 U/L (ref 39–117)
Total Protein: 7.8 g/dL (ref 6.0–8.3)

## 2011-05-07 LAB — GLUCOSE, CAPILLARY: Glucose-Capillary: 122 mg/dL — ABNORMAL HIGH (ref 70–99)

## 2011-05-07 LAB — FERRITIN: Ferritin: 1085 ng/mL — ABNORMAL HIGH (ref 22–322)

## 2011-05-07 LAB — BASIC METABOLIC PANEL
CO2: 23 mEq/L (ref 19–32)
Calcium: 9.1 mg/dL (ref 8.4–10.5)
Chloride: 123 mEq/L — ABNORMAL HIGH (ref 96–112)
Potassium: 5.9 mEq/L — ABNORMAL HIGH (ref 3.5–5.1)
Sodium: 151 mEq/L — ABNORMAL HIGH (ref 135–145)

## 2011-05-07 LAB — TSH: TSH: 0.534 u[IU]/mL (ref 0.350–4.500)

## 2011-05-07 LAB — VALPROIC ACID LEVEL: Valproic Acid Lvl: 41.7 ug/mL — ABNORMAL LOW (ref 50.0–100.0)

## 2011-05-07 LAB — VITAMIN B12: Vitamin B-12: 380 pg/mL (ref 211–911)

## 2011-05-07 MED ORDER — STERILE WATER FOR INJECTION IV SOLN
INTRAVENOUS | Status: DC
  Administered 2011-05-07 – 2011-05-08 (×6): via INTRAVENOUS
  Filled 2011-05-07 (×9): qty 9.7

## 2011-05-07 MED ORDER — SODIUM POLYSTYRENE SULFONATE 15 GM/60ML PO SUSP
30.0000 g | ORAL | Status: AC
  Administered 2011-05-07 (×2): 30 g via ORAL
  Filled 2011-05-07 (×2): qty 120

## 2011-05-07 NOTE — Progress Notes (Signed)
Subjective: Interval History: has no complaint of nausea or vomiting. Patient is a awake and alert but does not comunicate  Objective: Vital signs in last 24 hours: Temp:  [98.4 F (36.9 C)-99.4 F (37.4 C)] 99.4 F (37.4 C) (01/29 0636) Pulse Rate:  [71-79] 71  (01/29 0636) Resp:  [18-20] 18  (01/29 0636) BP: (101-111)/(65-69) 101/65 mmHg (01/29 0636) SpO2:  [90 %-96 %] 90 % (01/29 0636) Weight:  [83 kg (182 lb 15.7 oz)] 83 kg (182 lb 15.7 oz) (01/29 0500) Weight change: -8.3 kg (-18 lb 4.8 oz)  Intake/Output from previous day: 01/28 0701 - 01/29 0700 In: 5040.1 [P.O.:1220; I.V.:3820.1] Out: 6050 [Urine:6050] Intake/Output this shift:    General appearance: alert and no distress Resp: clear to auscultation bilaterally Cardio: regular rate and rhythm, S1, S2 normal, no murmur, click, rub or gallop GI: soft, non-tender; bowel sounds normal; no masses,  no organomegaly Extremities: extremities normal, atraumatic, no cyanosis or edema  Lab Results:  [facility] 05/05/11 0954  WBC 11.9*  HGB 10.1*  HCT 31.9*  PLT 197   BMET:  Basename 05/07/11 0512 05/06/11 0931  NA 151* 144  K 5.9* 5.4*  CL 123* 118*  CO2 23 22  GLUCOSE 101* 90  BUN 38* 45*  CREATININE 5.15* 6.02*  CALCIUM 9.1 8.4   No results found for this basename: PTH:2 in the last 72 hours Iron Studies:  Basename 05/06/11 1427  IRON 95  TIBC 197*  TRANSFERRIN --  FERRITIN 1085*    Studies/Results: X-ray Chest Pa And Lateral   05/05/2011  *RADIOLOGY REPORT*  Clinical Data: Renal failure  CHEST - 2 VIEW  Comparison: 07/04/2009  Findings: Heart size is normal.  There is no pleural effusion identified.  Bilateral hazy lung opacities are identified.  The visualized osseous structures appear unremarkable.  IMPRESSION:  1.  Bilateral hazy lung opacities.  In the setting of renal failure this likely represents edema.  Original Report Authenticated By: Rosealee Albee, M.D.   Ct Head Wo Contrast  05/05/2011   *RADIOLOGY REPORT*  Clinical Data: Altered level of consciousness, dysphagia,  CT HEAD WITHOUT CONTRAST  Technique:  Contiguous axial images were obtained from the base of the skull through the vertex without contrast.  Comparison: 05/01/2011  Findings:  The gray-white differentiation is maintained.  No CT evidence of acute large territory infarct.  No intraparenchymal or extra-axial mass or hemorrhage.  Unchanged size and configuration of the ventricles and basilar cisterns.  There is unchanged calcification about the posterior midline falx.  There is minimal mucosal thickening within the right maxillary sinus. Unchanged opacification of the left frontal sinus.  The remaining paranasal sinuses and mastoid air cells are patent.  Regional soft tissues are normal.  Normal appearance of the orbits.  IMPRESSION: 1.  No acute intracranial disease.  2.  Left frontal sinus opacification.  Original Report Authenticated By: Waynard Reeds, M.D.   US Renal  05/06/2011  *RADIOLOGY REPORT*  Clinical Data: 51 year old male with renal failure.  RENAL/URINARY TRACT ULTRASOUND COMPLETE  Comparison:  None  Findings:  Right Kidney:  The right kidney is upper limits of normal in echogenicity measuring 11.7 cm.  There is no evidence of hydronephrosis, solid renal mass or definite renal calculi.  Left Kidney:  The left kidney is upper limits of normal in echogenicity measuring 10.3 cm.  There is no evidence of hydronephrosis, solid renal mass or definite renal calculi.  Bladder:  The bladder is collapsed and contains a Foley  catheter.  IMPRESSION: Upper limits of normal renal echogenicity which may reflect medical renal disease.  No evidence of hydronephrosis.  Foley catheter within bladder.  Original Report Authenticated By: Rosendo Gros, M.D.    I have reviewed the patient's current medications.  Assessment/Plan: problem #1 acute kidney injury at this could be secondary to prerenal syndrome her versus her secondary to her  lithium toxicity and the interstitial nephritis. His pending creatinine this moment seems to be improving. Patient is none oliguric. Problem #2 hyperkalemia possibly related to his renal insufficiency potassium seems to be increasing. Problem #3 history of her HIV positive Problem #4 history of her lithium toxicity lithium level has seems to be her declining. Problem #5 history of schizophrenia Problem #6 history of bipolar disorder Problem #7 history of depression major Problem #8 history of hyponatremia possibly secondary to abnormal cyanosis sodium bicarbonate.  Plan: We'll change his IV fluid to D5 water with a sodium bicarbonate and continue to present rate Will give him kyoxalate 30 g x2 doses We'll check his basic metabolic panel and CBC in the morning. We'll check also his lithium level.     LOS: 2 days   Alok Minshall S 05/07/2011,9:22 AM

## 2011-05-07 NOTE — Progress Notes (Signed)
Nathan [name], [name]             ACCOUNT NO.:  [id number]  MEDICAL RECORD NO.:  [id number]  LOCATION:  A331                          FACILITY:  [facility]  PHYSICIAN:  Nathan [name], MDDATE OF BIRTH:  [birth year]  DATE OF PROCEDURE: DATE OF DISCHARGE:                                PROGRESS NOTE   Problems are listed as follows: 1. Acute renal failure, possibly secondary to prerenal syndrome versus     lithium toxicity or interstitial nephritis. 2. Hyperkalemia. 3. Human immunodeficiency virus positivity. 4. Lithium toxicity, which is improving. 5. Schizophrenia with delusions. 6. Bipolar disorder. 7. Depressive disorder. 8. Hypernatremia, sodium of 151.  PHYSICAL EXAMINATION:  VITAL SIGNS:  Blood pressure today is 101/65, temperature is 99.4, pulse is 71 and regular, respiratory rate is 18, O2 sat is 90%. NECK:  Shows no JVD. LUNGS:  Clear to A and P.  No rales, wheeze, or rhonchi appreciable. HEART:  Regular rhythm.  No S3, S4, gallops appreciable. ABDOMEN:  Essentially benign.  BMET, sodium 151, potassium 5.9, chloride 123, BUN 38, creatinine improving from 7.3 to 5.1 with normal saline.  Our plan right now is to give Kayexalate 30 g x2, check BMET and CBC in the morning, change IV fluids to D5 with some sodium bicarb and continue the current rate.  Check his lithium level in the morning.  His Depakote has been decreased to 500 mg daily with valproic acid level.     Nathan Novas, MD     RMD/MEDQ  D:  05/07/2011  T:  05/07/2011  Job:  [id number]

## 2011-05-08 LAB — CBC
HCT: 33.6 % — ABNORMAL LOW (ref 39.0–52.0)
Hemoglobin: 10.3 g/dL — ABNORMAL LOW (ref 13.0–17.0)
MCH: 29.5 pg (ref 26.0–34.0)
MCV: 96.3 fL (ref 78.0–100.0)
RBC: 3.49 MIL/uL — ABNORMAL LOW (ref 4.22–5.81)

## 2011-05-08 LAB — BASIC METABOLIC PANEL
CO2: 25 mEq/L (ref 19–32)
Calcium: 8.9 mg/dL (ref 8.4–10.5)
Glucose, Bld: 101 mg/dL — ABNORMAL HIGH (ref 70–99)
Potassium: 4.7 mEq/L (ref 3.5–5.1)
Sodium: 151 mEq/L — ABNORMAL HIGH (ref 135–145)

## 2011-05-08 LAB — HEPATIC FUNCTION PANEL
Albumin: 3.5 g/dL (ref 3.5–5.2)
Alkaline Phosphatase: 97 U/L (ref 39–117)
Total Protein: 8.6 g/dL — ABNORMAL HIGH (ref 6.0–8.3)

## 2011-05-08 LAB — GLUCOSE, CAPILLARY: Glucose-Capillary: 99 mg/dL (ref 70–99)

## 2011-05-08 LAB — LITHIUM LEVEL: Lithium Lvl: 0.97 mEq/L (ref 0.80–1.40)

## 2011-05-08 NOTE — Progress Notes (Signed)
[id number] 

## 2011-05-08 NOTE — Progress Notes (Signed)
Nathan [name]  MRN: [id number]  DOB/AGE: 1961/03/07 51 y.o.  Primary Care Physician:DONDIEGO,RICHARD M, MD, MD  Admit date: 05/05/2011  Chief Complaint:  Chief Complaint  Patient presents with  . possible stroke     S-Pt presented on  05/05/2011 with  Chief Complaint  Patient presents with  . possible stroke   . Pt offers No complaints.   Meds     . atazanavir  300 mg Oral Q breakfast  . clonazePAM  2 mg Oral QHS  . divalproex  500 mg Oral Daily  . emtricitabine-tenofovir  1 tablet Oral Daily  . enoxaparin  30 mg Subcutaneous Q24H  . famotidine  20 mg Oral Daily  . OLANZapine  20 mg Oral QHS  . ritonavir  100 mg Oral Q breakfast  . Tamsulosin HCl  0.4 mg Oral Daily         WUJ:WJXBJ from the symptoms mentioned above,there are no other symptoms referable to all systems reviewed.  Physical Exam: Vital signs in last 24 hours: Temp:  [98.2 F (36.8 C)-99.3 F (37.4 C)] 98.2 F (36.8 C) (01/30 0512) Pulse Rate:  [73-79] 74  (01/30 0952) Resp:  [20] 20  (01/30 0512) BP: (123-125)/(67-73) 125/67 mmHg (01/30 0512) SpO2:  [92 %-97 %] 97 % (01/30 0952) Weight:  [185 lb 13.6 oz (84.3 kg)] 185 lb 13.6 oz (84.3 kg) (01/30 0512) Weight change: 2 lb 13.9 oz (1.3 kg) Last BM Date: 05/07/11  Intake/Output from previous day: 01/29 0701 - 01/30 0700 In: 240 [P.O.:240] Out: 6025 [Urine:6025] Total I/O In: -  Out: 2000 [Urine:2000]   Physical Exam: General appearance: alert and no distress  Resp: clear to auscultation bilaterally  Cardio: regular rate and rhythm, S1, S2 normal, no murmur, click, rub or gallop  GI: soft, non-tender; bowel sounds normal; no masses, no organomegaly  Extremities: extremities normal, atraumatic, no cyanosis or edema   Lab Results: CBC  Basename 05/08/11 0508  WBC 10.6*  HGB 10.3*  HCT 33.6*  PLT 232    BMET  Basename 05/08/11 0508 05/07/11 0512  NA 151* 151*  K 4.7 5.9*  CL 119* 123*  CO2 25 23  GLUCOSE 101* 101*  BUN  25* 38*  CREATININE 3.97* 5.15*  CALCIUM 8.9 9.1    Trend  Na 144==>151==>151 Creat 6.02==>5.15==>3.97 K  5.4==>5.9==>4.7  MICRO Recent Results (from the past 240 hour(s))  MRSA PCR SCREENING     Status: Normal   Collection Time   05/05/11  5:00 PM      Component Value Range Status Comment   MRSA by PCR NEGATIVE  NEGATIVE  Final       Lab Results  Component Value Date   CALCIUM 8.9 05/08/2011   PHOS 4.2 05/07/2011      Lithium Level 2.47=>0.72      Impression: 1)Renal  AKI secondary to 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     2)HTN   BP at goal Target Organ damage  CKD    3)Anemia HGb at goal (9--11)   4)CKD Mineral-Bone Disorder PTH not avail Secondary Hyperparathyroidism w/u pending  Phosphorus at goal.   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 Will ask For PTH-I will follow BMet Need to follow i/o strictly         Anvith Mauriello S 05/08/2011, 3:52 PM

## 2011-05-08 NOTE — Progress Notes (Signed)
Nathan [name], Nathan [name]             ACCOUNT NO.:  [id number]  MEDICAL RECORD NO.:  [id number]  LOCATION:                                 FACILITY:  PHYSICIAN:  [name], MDDATE OF BIRTH:  [birth year]  DATE OF PROCEDURE: DATE OF DISCHARGE:                                PROGRESS NOTE   PROBLEMS: 1. Acute renal failure, presumably due to long-standing diabetes,     human immunodeficiency virus, possible nephropathy, prerenal     azotemia, multifactorial. 2. Lithium toxicity improving. 3. Hyperkalemia, now corrected with Kayexalate. 4. Human immunodeficiency virus positivity. 5. Schizophrenia. 6. Bipolar. 7. Depressive disorder. 8. Hypernatremia with sodium of 151.  Blood pressure 125/67, temperature 98.2, pulse is 79 and regular, respiratory rate is 20, O2 sat 92%.  WBC is increased to 10.6, hemoglobin 10.3.  Sodium remained 151, potassium normalized to 4.7, and chloride number is 119, creatinine improved from 6 to 5 to 3.9 today.  Renal ultrasound revealed a mildly increased renal parenchymal density consistent with medical renal disease.  Fluids have been switched to D5 and we will monitor the BMET daily and Depakote decreased to 500 p.o. daily, lithium still on hold. Awaiting lithium results this morning.  Neck shows no JVD.  Lungs are clear to A and P.  No rales, wheeze, rhonchi appreciable.  Heart, regular rhythm.  No S3, S4.  The patient denies dyspnea, orthopnea, cough, sputum, or hemoptysis.  Await Depakote and lithium levels this morning, and monitor renal function as per Renal.     [name], MD     RMD/MEDQ  D:  05/08/2011  T:  05/08/2011  Job:  [id number]

## 2011-05-09 LAB — BASIC METABOLIC PANEL
BUN: 22 mg/dL (ref 6–23)
Creatinine, Ser: 3.31 mg/dL — ABNORMAL HIGH (ref 0.50–1.35)
GFR calc Af Amer: 23 mL/min — ABNORMAL LOW (ref >90)
GFR calc non Af Amer: 20 mL/min — ABNORMAL LOW (ref >90)
Glucose, Bld: 108 mg/dL — ABNORMAL HIGH (ref 70–99)

## 2011-05-09 LAB — GLUCOSE, CAPILLARY
Glucose-Capillary: 95 mg/dL (ref 70–99)
Glucose-Capillary: 165 mg/dL — ABNORMAL HIGH (ref 70–99)

## 2011-05-09 LAB — HEPATIC FUNCTION PANEL
ALT: 11 U/L (ref 0–53)
AST: 13 U/L (ref 0–37)
Bilirubin, Direct: 0.1 mg/dL (ref 0.0–0.3)
Total Bilirubin: 0.6 mg/dL (ref 0.3–1.2)

## 2011-05-09 MED ORDER — LORAZEPAM 2 MG/ML IJ SOLN
INTRAMUSCULAR | Status: AC
  Filled 2011-05-09: qty 1

## 2011-05-09 MED ORDER — SODIUM CHLORIDE 0.9 % IJ SOLN
INTRAMUSCULAR | Status: AC
  Administered 2011-05-09: 10 mL
  Filled 2011-05-09: qty 3

## 2011-05-09 MED ORDER — SODIUM CHLORIDE 0.45 % IV SOLN
INTRAVENOUS | Status: DC
  Administered 2011-05-09 – 2011-05-10 (×2): via INTRAVENOUS
  Administered 2011-05-10: 150 mL/h via INTRAVENOUS
  Administered 2011-05-11 (×2): via INTRAVENOUS
  Administered 2011-05-12: 1000 mL via INTRAVENOUS
  Administered 2011-05-13: 03:00:00 via INTRAVENOUS

## 2011-05-09 MED ORDER — LORAZEPAM 2 MG/ML IJ SOLN
1.0000 mg | Freq: Once | INTRAMUSCULAR | Status: AC
  Administered 2011-05-09: 1 mg via INTRAVENOUS

## 2011-05-09 NOTE — Progress Notes (Signed)
Subjective: Interval History: none.  Objective: Vital signs in last 24 hours: Temp:  [97.7 F (36.5 C)-99.5 F (37.5 C)] 97.7 F (36.5 C) (01/31 0622) Pulse Rate:  [62-74] 62  (01/31 0622) Resp:  [20] 20  (01/31 0622) BP: (96-109)/(62-63) 109/63 mmHg (01/31 0622) SpO2:  [95 %-97 %] 95 % (01/31 0622) Weight:  [84.5 kg (186 lb 4.6 oz)] 84.5 kg (186 lb 4.6 oz) (01/31 0418) Weight change: 0.2 kg (7.1 oz)  Intake/Output from previous day: 01/30 0701 - 01/31 0700 In: 6100 [P.O.:240; I.V.:5860] Out: 6300 [Urine:6300] Intake/Output this shift:    Patient is very somnolent but arousable and that he's not complicating. Chest clear to auscultation he'll have any rales rhonchi or egophony Heart exam revealed regular rate and rhythm no murmur no S3 Abdomen soft positive bowel sound Extremities he doesn't have any edema.  Lab Results:  [facility] 05/08/11 0508  WBC 10.6*  HGB 10.3*  HCT 33.6*  PLT 232   BMET:  Basename 05/09/11 0614 05/08/11 0508  NA 150* 151*  K 4.7 4.7  CL 120* 119*  CO2 24 25  GLUCOSE 108* 101*  BUN 22 25*  CREATININE 3.31* 3.97*  CALCIUM 9.6 8.9   No results found for this basename: PTH:2 in the last 72 hours Iron Studies:  Basename 05/06/11 1427  IRON 95  TIBC 197*  TRANSFERRIN --  FERRITIN 1085*    Studies/Results: No results found.  I have reviewed the patient's current medications.  Assessment/Plan: Problem #1 acute kidney injury BUN is 22 creatinine is 3.31 progressively improving with potassium of 4.7. 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 Problem #3 history of HIV positive Problem #4 history of schizophrenia Problem #5 history of bipolar disorder Problem #6 history of depression Problem #7 history of anemia possibly secondary to chronic disease as patient ferritin seems to be. Problem #8 lithium toxicity lithium level has   declined Plan: We'll DC sodium bicarbonate We'll start him on half normal saline at 135 cc per hour We'll check his basic metabolic panel in the morning.    LOS: 4 days   Nathan [name] S 05/09/2011,8:18 AM

## 2011-05-09 NOTE — Progress Notes (Signed)
NAMEJOACHIM, CARTON             ACCOUNT NO.:  [id number]  MEDICAL RECORD NO.:  [id number]  LOCATION:  A331                          FACILITY:  [facility]  PHYSICIAN:  [name], MDDATE OF BIRTH:  Jul 05, 1960  DATE OF PROCEDURE: DATE OF DISCHARGE:                                PROGRESS NOTE   The patient is a 51 year old black male with chronic schizophrenia, bipolar disorder, depression, HIV positivity who was admitted with acute renal failure, progressive, presumably since the last year or so when it was recorded as normal.  He also has some lithium toxicity with the renal failure, some hyperkalemia which was corrected with Kayexalate, and some hypernatremia with a sodium of 150 currently.  He is much more alert today, receiving aggressive fluid therapy in the form of D5. Today's creatinine is down to 3.31 from admission of 7.  Sodium is 150, potassium 4.7, chloride 120.  His valproic acid level still elevated as of 36 hours ago at 41.  Lithium has been discontinued.  The lungs are clear.  No rales, wheezes, or rhonchi.  Heart, regular rhythm.  No S3, S4 gallops, heaves, thrills, or rubs.  Blood pressure 109/63.  The plan right now, is to continue with aggressive fluid management, monitor renal function, hold valproic acid to 500 mg down from 1500 mg per day for seizure disorder and ambulate the patient.     [name], MD     RMD/MEDQ  D:  05/09/2011  T:  05/09/2011  Job:  [id number]

## 2011-05-09 NOTE — Progress Notes (Signed)
[id number] 

## 2011-05-10 LAB — BASIC METABOLIC PANEL
BUN: 22 mg/dL (ref 6–23)
CO2: 20 mEq/L (ref 19–32)
Calcium: 9.4 mg/dL (ref 8.4–10.5)
Chloride: 119 mEq/L — ABNORMAL HIGH (ref 96–112)
Creatinine, Ser: 3.16 mg/dL — ABNORMAL HIGH (ref 0.50–1.35)
GFR calc Af Amer: 24 mL/min — ABNORMAL LOW (ref >90)
GFR calc non Af Amer: 21 mL/min — ABNORMAL LOW (ref >90)
Glucose, Bld: 146 mg/dL — ABNORMAL HIGH (ref 70–99)
Potassium: 4.6 mEq/L (ref 3.5–5.1)
Sodium: 146 mEq/L — ABNORMAL HIGH (ref 135–145)

## 2011-05-10 LAB — PTH, INTACT AND CALCIUM
Calcium, Total (PTH): 8.6 mg/dL (ref 8.4–10.5)
PTH: 242.7 pg/mL — ABNORMAL HIGH (ref 14.0–72.0)

## 2011-05-10 LAB — GLUCOSE, CAPILLARY
Glucose-Capillary: 104 mg/dL — ABNORMAL HIGH (ref 70–99)
Glucose-Capillary: 113 mg/dL — ABNORMAL HIGH (ref 70–99)

## 2011-05-10 NOTE — Plan of Care (Signed)
Problem: Phase III Progression Outcomes Goal: Pain controlled on oral analgesia Outcome: Progressing Pt has not complained of any pain.

## 2011-05-10 NOTE — Progress Notes (Signed)
[id number] 

## 2011-05-10 NOTE — Plan of Care (Signed)
Problem: Phase III Progression Outcomes Goal: Tolerating diet Outcome: Progressing Pt has a hard time with some foods. He at times need assistance with his meals.  Set up and so forth.

## 2011-05-10 NOTE — Progress Notes (Signed)
NAMEFINNICK, OROSZ             ACCOUNT NO.:  [id number]  MEDICAL RECORD NO.:  [id number]  LOCATION:                                 FACILITY:  PHYSICIAN:  [name], MDDATE OF BIRTH:  26-Jun-1960  DATE OF PROCEDURE: DATE OF DISCHARGE:                                PROGRESS NOTE   Since the patient has acute renal failure which is improving with aggressive IV fluid hydration, his hypernatremia now down from 151, sodium to 146.  Creatinine has dropped to 3.16, diminished rate of improvement over previous several days.  PHYSICAL EXAMINATION:  VITAL SIGNS:  Blood pressure 105/64, temperature 98.7, pulse is 90 and regular, respiratory rate is 20, O2 sat is 90%. GENERAL:  Patient eating more, alert. LUNGS:  Clear.  No rales, wheeze, or rhonchi. HEART:  Regular rhythm.  No murmurs, gallops, or rubs. ABDOMEN:  Benign.  Plan right now is continue D5 infusion, check BMET and CBC in a.m. and continue IV hydration for 2-3 days.  We will check valproic acid level.     [name], MD     RMD/MEDQ  D:  05/10/2011  T:  05/10/2011  Job:  [id number]

## 2011-05-10 NOTE — Progress Notes (Signed)
Subjective: Interval History: none.  Objective: Vital signs in last 24 hours: Temp:  [97.8 F (36.6 C)-97.9 F (36.6 C)] 97.9 F (36.6 C) (02/01 0655) Pulse Rate:  [70-90] 90  (02/01 0655) Resp:  [20] 20  (02/01 0655) BP: (105-117)/(64-75) 105/64 mmHg (02/01 0655) SpO2:  [90 %-96 %] 90 % (02/01 0655) Weight:  [84.4 kg (186 lb 1.1 oz)] 84.4 kg (186 lb 1.1 oz) (02/01 0258) Weight change: -0.1 kg (-3.5 oz)  Intake/Output from previous day: 01/31 0701 - 02/01 0700 In: 960 [P.O.:960] Out: 4900 [Urine:4900] Intake/Output this shift:    General appearance: Patient is somnolent and barely arousable. Resp: clear to auscultation bilaterally Cardio: regular rate and rhythm, S1, S2 normal, no murmur, click, rub or gallop GI: soft, non-tender; bowel sounds normal; no masses,  no organomegaly Extremities: extremities normal, atraumatic, no cyanosis or edema  Lab Results:  [facility] 05/08/11 0508  WBC 10.6*  HGB 10.3*  HCT 33.6*  PLT 232   BMET:  Basename 05/10/11 0514 05/09/11 0614  NA 146* 150*  K 4.6 4.7  CL 119* 120*  CO2 20 24  GLUCOSE 146* 108*  BUN 22 22  CREATININE 3.16* 3.31*  CALCIUM 9.4 9.6   No results found for this basename: PTH:2 in the last 72 hours Iron Studies: No results found for this basename: IRON,TIBC,TRANSFERRIN,FERRITIN in the last 72 hours  Studies/Results: No results found.  I have reviewed the patient's current medications.  Assessment/Plan: Problem #1 renal failure at this moment acute his BUN is 22 his creatinine is 3.16 renal function seems to be improving. Problem #2 hypernatremia sodium is 146 improving possibly her secondary to lithium. Problem #3 history of lithium toxicity Problem #4 history of anemia mild his hemoglobin is 10.3 hematocrit 30.6. Problem #5 history of schizophrenia Problem #6 history of bipolar disorder Problem #7 history of depression. Problem #8 history of HIV-positive. Plan: We'll continue his hydration. At this  moment patient seems to be polyuric possibly secondary to nephrogenic DI. We'll check his basic metabolic panel and CBC. If is hypernatremia doesn't improve and remained polyuric probably will try to use amiloride.    LOS: 5 days   Brave Dack S 05/10/2011,8:27 AM

## 2011-05-11 LAB — PHOSPHORUS: Phosphorus: 3.7 mg/dL (ref 2.3–4.6)

## 2011-05-11 LAB — GLUCOSE, CAPILLARY
Glucose-Capillary: 167 mg/dL — ABNORMAL HIGH (ref 70–99)
Glucose-Capillary: 151 mg/dL — ABNORMAL HIGH (ref 70–99)
Glucose-Capillary: 132 mg/dL — ABNORMAL HIGH (ref 70–99)

## 2011-05-11 LAB — CBC
HCT: 30.7 % — ABNORMAL LOW (ref 39.0–52.0)
MCH: 30.2 pg (ref 26.0–34.0)
MCV: 96.5 fL (ref 78.0–100.0)
RDW: 14.3 % (ref 11.5–15.5)
WBC: 8.7 10*3/uL (ref 4.0–10.5)

## 2011-05-11 LAB — BASIC METABOLIC PANEL
BUN: 21 mg/dL (ref 6–23)
CO2: 21 mEq/L (ref 19–32)
Calcium: 9 mg/dL (ref 8.4–10.5)
Chloride: 116 mEq/L — ABNORMAL HIGH (ref 96–112)
Creatinine, Ser: 2.83 mg/dL — ABNORMAL HIGH (ref 0.50–1.35)
GFR calc Af Amer: 28 mL/min — ABNORMAL LOW (ref >90)
GFR calc non Af Amer: 24 mL/min — ABNORMAL LOW (ref >90)
Glucose, Bld: 118 mg/dL — ABNORMAL HIGH (ref 70–99)
Potassium: 4.4 mEq/L (ref 3.5–5.1)
Sodium: 142 mEq/L (ref 135–145)

## 2011-05-11 LAB — FOLATE: Folate: 3.4 ng/mL

## 2011-05-11 LAB — IRON AND TIBC
Iron: 74 ug/dL (ref 42–135)
Saturation Ratios: 37 % (ref 20–55)

## 2011-05-11 LAB — RETICULOCYTES
RBC.: 3.17 MIL/uL — ABNORMAL LOW (ref 4.22–5.81)
Retic Count, Absolute: 38 10*3/uL (ref 19.0–186.0)

## 2011-05-11 LAB — VITAMIN B12: Vitamin B-12: 461 pg/mL (ref 211–911)

## 2011-05-11 MED ORDER — CALCITRIOL 0.25 MCG PO CAPS
0.2500 ug | ORAL_CAPSULE | Freq: Every day | ORAL | Status: DC
  Administered 2011-05-11 – 2011-05-15 (×5): 0.25 ug via ORAL
  Filled 2011-05-11 (×8): qty 1

## 2011-05-11 NOTE — Progress Notes (Signed)
[id number] 

## 2011-05-11 NOTE — Progress Notes (Signed)
Subjective: Interval History: none. Patient her remains very confused and somnolent.  Objective: Vital signs in last 24 hours: Temp:  [98.1 F (36.7 C)-99.1 F (37.3 C)] 98.7 F (37.1 C) (02/02 0707) Pulse Rate:  [64-86] 64  (02/02 0707) Resp:  [20] 20  (02/02 0707) BP: (107-135)/(71-77) 107/71 mmHg (02/02 0707) SpO2:  [94 %-99 %] 94 % (02/02 0707) Weight:  [87.5 kg (192 lb 14.4 oz)] 87.5 kg (192 lb 14.4 oz) (02/02 0444) Weight change: 3.1 kg (6 lb 13.4 oz)  Intake/Output from previous day: 02/01 0701 - 02/02 0700 In: 2080 [P.O.:360; I.V.:1720] Out: 3200 [Urine:3200] Intake/Output this shift:    General appearance: no distress and Very sleepy but arousable  Lab Results:  [facility] 05/11/11 0408  WBC 8.7  HGB 9.6*  HCT 30.7*  PLT 212   BMET:  Basename 05/11/11 0408 05/10/11 0514  NA 142 146*  K 4.4 4.6  CL 116* 119*  CO2 21 20  GLUCOSE 118* 146*  BUN 21 22  CREATININE 2.83* 3.16*  CALCIUM 9.0 9.4    Basename 05/09/11 0614  PTH 242.7*   Iron Studies: No results found for this basename: IRON,TIBC,TRANSFERRIN,FERRITIN in the last 72 hours  Studies/Results: No results found.  I have reviewed the patient's current medications.  Assessment/Plan: Problem #1 renal failure at this moment seems to be acute on chronic his BUN is 21 and creatinine is 2.8 progressively improving. Patient is none oliguric. Problem #2 hypernatremia sodium is 142 has corrected. Problem #3 anemia his hemoglobin is 9.6 hematocrit is 30.7 stable Problem #4 secondary hyperparathyroidism PTH is 232 and possibly secondary to chronic renal failure. Problem #4 history of HIV positive Problem #5 history of schizophrenia Problem #6 history of bipolar disorder Problem #7 history of depression. Plan: We'll start him on calcitriol 0.25 mcg once a day orally We'll continue his hydration and check his basic metabolic panel in the morning. We'll check iron studies.  LOS: 6 days   Mahki Spikes  S 05/11/2011,12:45 PM

## 2011-05-11 NOTE — Progress Notes (Signed)
CSW reviewed MD notes.  Renal functioning appears to be improving.  D/C back to [facility] when medically stable.  CSW will continue to follow.  Karn Cassis

## 2011-05-11 NOTE — Progress Notes (Signed)
Nathan [name], Nathan [name]             ACCOUNT NO.:  [id number]  MEDICAL RECORD NO.:  [id number]  LOCATION:  A331                          FACILITY:  [facility]  PHYSICIAN:  [name], MDDATE OF BIRTH:  [birth year]  DATE OF PROCEDURE: DATE OF DISCHARGE:                                PROGRESS NOTE   The patient has HIV positivity, schizophrenia, delusions, bipolar disorder, depression, anemia of chronic disease, acute renal failure, recent within the last year onset of diabetes, and hypernatremia now resolving.  VITAL SIGNS:  Blood pressure is 107/71, temperature 98.7, pulse 64 and regular, respiratory rate is 20, O2 sat is 94%. NECK:  No JVD. GENERAL:  The patient is alert.  He is eating. LUNGS:  Clear to A and P.  No rales, wheezes, or rhonchi. HEART:  Regular rate and rhythm.  No murmurs, gallops, or rubs. ABDOMEN:  Essentially benign.  With aggressive fluid hydration, hemoglobin dropped to 9.6, sodium dropped to 142, and creatinine dropped to 2.83, significant improvement.  PLAN:  Right now is to continue aggressive fluid hydration, monitor renal function, get stools for occult blood, anemia profile and monitor hemoglobin also.     [name], MD     RMD/MEDQ  D:  05/11/2011  T:  05/11/2011  Job:  [id number]

## 2011-05-12 LAB — BASIC METABOLIC PANEL
BUN: 20 mg/dL (ref 6–23)
CO2: 21 mEq/L (ref 19–32)
Calcium: 9 mg/dL (ref 8.4–10.5)
Chloride: 117 mEq/L — ABNORMAL HIGH (ref 96–112)
Creatinine, Ser: 2.54 mg/dL — ABNORMAL HIGH (ref 0.50–1.35)
GFR calc Af Amer: 32 mL/min — ABNORMAL LOW (ref >90)
GFR calc non Af Amer: 27 mL/min — ABNORMAL LOW (ref >90)
Glucose, Bld: 145 mg/dL — ABNORMAL HIGH (ref 70–99)
Potassium: 4.7 mEq/L (ref 3.5–5.1)
Sodium: 143 mEq/L (ref 135–145)

## 2011-05-12 LAB — CBC
HCT: 30.3 % — ABNORMAL LOW (ref 39.0–52.0)
Hemoglobin: 9.5 g/dL — ABNORMAL LOW (ref 13.0–17.0)
MCH: 30.1 pg (ref 26.0–34.0)
MCHC: 31.4 g/dL (ref 30.0–36.0)
MCV: 95.9 fL (ref 78.0–100.0)
Platelets: 220 10*3/uL (ref 150–400)
RBC: 3.16 MIL/uL — ABNORMAL LOW (ref 4.22–5.81)
RDW: 14.5 % (ref 11.5–15.5)
WBC: 9.4 10*3/uL (ref 4.0–10.5)

## 2011-05-12 LAB — IRON AND TIBC
Saturation Ratios: 38 % (ref 20–55)
UIBC: 125 ug/dL (ref 125–400)

## 2011-05-12 LAB — GLUCOSE, CAPILLARY: Glucose-Capillary: 120 mg/dL — ABNORMAL HIGH (ref 70–99)

## 2011-05-12 MED ORDER — ENOXAPARIN SODIUM 40 MG/0.4ML ~~LOC~~ SOLN
40.0000 mg | SUBCUTANEOUS | Status: DC
  Administered 2011-05-12 – 2011-05-14 (×3): 40 mg via SUBCUTANEOUS
  Filled 2011-05-12 (×3): qty 0.4

## 2011-05-12 NOTE — Progress Notes (Signed)
Subjective: Interval History: has no complaint of nausea or vomiting or difficulty increasing..  Objective: Vital signs in last 24 hours: Temp:  [97.2 F (36.2 C)-98.6 F (37 C)] 97.2 F (36.2 C) (02/03 0615) Pulse Rate:  [68-76] 68  (02/03 0615) Resp:  [16-20] 20  (02/03 0615) BP: (102-112)/(68-73) 102/68 mmHg (02/03 0615) SpO2:  [92 %-97 %] 94 % (02/03 0615) Weight:  [87.6 kg (193 lb 2 oz)] 87.6 kg (193 lb 2 oz) (02/03 0500) Weight change: 0.1 kg (3.5 oz)  Intake/Output from previous day: 02/02 0701 - 02/03 0700 In: 3540 [P.O.:240; I.V.:3300] Out: 8400 [Urine:8400] Intake/Output this shift:    General appearance: alert and no distress Resp: clear to auscultation bilaterally Cardio: regular rate and rhythm, S1, S2 normal, no murmur, click, rub or gallop GI: soft, non-tender; bowel sounds normal; no masses,  no organomegaly Extremities: extremities normal, atraumatic, no cyanosis or edema  Lab Results:  Basename 05/12/11 0448 05/11/11 0408  WBC 9.4 8.7  HGB 9.5* 9.6*  HCT 30.3* 30.7*  PLT 220 212   BMET:  Basename 05/12/11 0448 05/11/11 0408  NA 143 142  K 4.7 4.4  CL 117* 116*  CO2 21 21  GLUCOSE 145* 118*  BUN 20 21  CREATININE 2.54* 2.83*  CALCIUM 9.0 9.0   No results found for this basename: PTH:2 in the last 72 hours Iron Studies:  Basename 05/11/11 0500  IRON 74  TIBC 201*  TRANSFERRIN --  FERRITIN 1151*    Studies/Results: No results found.  I have reviewed the patient's current medications.  Assessment/Plan: Problem #1 acute kidney injury superimposed on chronic BUN is 20 creatinine is 2.54 renal function seems to be improving progressively. Problem #2 hypernatremia sodium is 143 has improved Problem #3 history of anemia at this moment ferritin is very high possibly anemia of chronic disease Problem #4 history of schizophrenia Problem #5 history of bipolar disorder Problem #6 secondary hyperparathyroidism is on Rocaltrol Problem #7 history  of HIV positive. Problem #8 history of depression. Plan: We'll decrease his IV fluid to 100 cc per hour I will follow his blood work and continue his other treatment as before.    LOS: 7 days   Nathan [name] S 05/12/2011,9:35 AM

## 2011-05-12 NOTE — Progress Notes (Signed)
Nathan [name], Nathan [name]             ACCOUNT NO.:  [id number]  MEDICAL RECORD NO.:  [id number]  LOCATION:  A331                          FACILITY:  [facility]  PHYSICIAN:  [name], MDDATE OF BIRTH:  12-14-1960  DATE OF PROCEDURE: DATE OF DISCHARGE:                                PROGRESS NOTE   PROBLEM LIST:  Human immunodeficiency virus positivity, schizophrenia with delusions, bipolar disorder, depression, acute renal failure presumed secondary to recent onset diabetes in the last year, on metformin, which has been removed and prerenal component.  The patient denies angina, orthopnea, PND.  Eating better, more alert. Blood pressure 102/68, temperature 97.2, pulse 68 and regular, respiratory rate is 20, O2 sat 94.  Hemoglobin 9.5, stable.  Renal function again improved.  Creatinine down to 2.53 with aggressive hydration.  Potassium 4.7, chloride 117.  PLAN:  Continue aggressive fluid for rehydration, monitor renal function, and consider discharge in a day or two.  We will await more definitive instructions from the Nephrology.     [name], MD     RMD/MEDQ  D:  05/12/2011  T:  05/12/2011  Job:  [id number]

## 2011-05-12 NOTE — Progress Notes (Signed)
[id number] 

## 2011-05-13 LAB — BASIC METABOLIC PANEL
BUN: 26 mg/dL — ABNORMAL HIGH (ref 6–23)
Creatinine, Ser: 2.53 mg/dL — ABNORMAL HIGH (ref 0.50–1.35)
GFR calc non Af Amer: 28 mL/min — ABNORMAL LOW (ref >90)
Glucose, Bld: 141 mg/dL — ABNORMAL HIGH (ref 70–99)
Potassium: 4.5 mEq/L (ref 3.5–5.1)

## 2011-05-13 LAB — NA AND K (SODIUM & POTASSIUM), RAND UR: Sodium, Ur: 55 mEq/L

## 2011-05-13 LAB — GLUCOSE, CAPILLARY: Glucose-Capillary: 150 mg/dL — ABNORMAL HIGH (ref 70–99)

## 2011-05-13 LAB — FERRITIN: Ferritin: 828 ng/mL — ABNORMAL HIGH (ref 22–322)

## 2011-05-13 LAB — OSMOLALITY: Osmolality: 313 mOsm/kg — ABNORMAL HIGH (ref 275–300)

## 2011-05-13 MED ORDER — SODIUM CHLORIDE 0.9 % IJ SOLN
INTRAMUSCULAR | Status: AC
  Administered 2011-05-13: 3 mL
  Filled 2011-05-13: qty 3

## 2011-05-13 MED ORDER — DEXTROSE 5 % IV SOLN
INTRAVENOUS | Status: DC
  Administered 2011-05-13: 100 mL via INTRAVENOUS
  Administered 2011-05-13: via INTRAVENOUS
  Administered 2011-05-14: 1000 mL via INTRAVENOUS

## 2011-05-13 MED ORDER — AMILORIDE HCL 5 MG PO TABS
5.0000 mg | ORAL_TABLET | Freq: Every day | ORAL | Status: DC
  Administered 2011-05-13 – 2011-05-14 (×2): 5 mg via ORAL
  Filled 2011-05-13 (×3): qty 1

## 2011-05-13 MED ORDER — NYSTATIN 100000 UNIT/ML MT SUSP
5.0000 mL | Freq: Four times a day (QID) | OROMUCOSAL | Status: DC
  Administered 2011-05-13 – 2011-05-15 (×5): 500000 [IU] via ORAL
  Filled 2011-05-13 (×5): qty 5

## 2011-05-13 NOTE — Progress Notes (Signed)
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  This patient has schizophrenia, bipolar disorder, HIV positive, depression, and hypertension. Currently, started on Norvasc 5 mg p.o. daily, the patient doing well. Neck shows no JVD.  No carotid bruits.  No thyromegaly.  No thyroid bruits.  Lungs show no rales, wheeze, or rhonchi appreciable.  Heart, regular rhythm.  No murmurs, gallops, heaves, thrills, or rubs. Hemoglobin 9.5, stable.  BUN and creatinine 26 and 2.53 with sodium of 146.  Plan right now per Renal is to check his serum osmolality and urine osmolality, and Norvasc 5 mg p.o. daily was added today.  We will make further recommendations as the database expands.     [name], MD     RMD/MEDQ  D:  05/13/2011  T:  05/13/2011  Job:  [id number]

## 2011-05-13 NOTE — Progress Notes (Signed)
[id number] 

## 2011-05-13 NOTE — Progress Notes (Signed)
Subjective: Interval History: has no complaint of difficulty breathing.  Objective: Vital signs in last 24 hours: Temp:  [97.5 F (36.4 C)-98.5 F (36.9 C)] 98.4 F (36.9 C) (02/04 0500) Pulse Rate:  [69-76] 69  (02/04 0500) Resp:  [18-20] 18  (02/04 0500) BP: (100-122)/(65-79) 100/65 mmHg (02/04 0500) SpO2:  [96 %-97 %] 97 % (02/04 0500) Weight:  [84.8 kg (186 lb 15.2 oz)] 84.8 kg (186 lb 15.2 oz) (02/04 0500) Weight change: -2.8 kg (-6 lb 2.8 oz)  Intake/Output from previous day: 02/03 0701 - 02/04 0700 In: 4208 [P.O.:240; I.V.:3968] Out: 16109 [Urine:10050] Intake/Output this shift: Total I/O In: 660 [P.O.:240; I.V.:420] Out: -   Patient somolent but arousable Chest is clear to auscultation His heart exam revealed regular rate and rhythm no murmur Abdomen soft positive bowel sound Extremities he doesn't have any edema.  Lab Results:  Basename 05/12/11 0448 05/11/11 0408  WBC 9.4 8.7  HGB 9.5* 9.6*  HCT 30.3* 30.7*  PLT 220 212   BMET:  Basename 05/13/11 0520 05/12/11 0448  NA 146* 143  K 4.5 4.7  CL 120* 117*  CO2 21 21  GLUCOSE 141* 145*  BUN 26* 20  CREATININE 2.53* 2.54*  CALCIUM 9.7 9.0   No results found for this basename: PTH:2 in the last 72 hours Iron Studies:  Basename 05/12/11 0448  IRON 76  TIBC 201*  TRANSFERRIN --  FERRITIN 828*    Studies/Results: No results found.  I have reviewed the patient's current medications.  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 Presently his renal function seems to be improving.   Problem #2 hypernatremia just a seems to be possibly secondary to nephrogenic DI associated with lithium he is on half normal saline sodium slightly up. Problem #3 history of schizophrenia Problem #4 history of bipolar disorder Problem #5 history of HIV-positive Problem #6 history of depression Problem #7 history of  hyper-parathyroidism is on Rocaltrol. Plan: We'll change his IV fluid to D5 water at present rate We'll start patient on amiloride 5 mg by mouth once a day We'll check his basic metabolic panel, urine sodium, urine osmolality and serum osmolality.   LOS: 8 days   Raylene Carmickle S 05/13/2011,11:34 AM

## 2011-05-14 LAB — GLUCOSE, CAPILLARY
Glucose-Capillary: 135 mg/dL — ABNORMAL HIGH (ref 70–99)
Glucose-Capillary: 189 mg/dL — ABNORMAL HIGH (ref 70–99)

## 2011-05-14 LAB — BASIC METABOLIC PANEL
BUN: 28 mg/dL — ABNORMAL HIGH (ref 6–23)
Calcium: 9.6 mg/dL (ref 8.4–10.5)
GFR calc non Af Amer: 29 mL/min — ABNORMAL LOW (ref >90)
Glucose, Bld: 178 mg/dL — ABNORMAL HIGH (ref 70–99)
Sodium: 143 mEq/L (ref 135–145)

## 2011-05-14 LAB — OSMOLALITY, URINE: Osmolality, Ur: 269 mOsm/kg — ABNORMAL LOW (ref 390–1090)

## 2011-05-14 MED ORDER — SODIUM CHLORIDE 0.9 % IJ SOLN
INTRAMUSCULAR | Status: AC
  Administered 2011-05-14
  Filled 2011-05-14: qty 3

## 2011-05-14 MED ORDER — CALCITRIOL 0.25 MCG PO CAPS: 0.2500 ug | ORAL_CAPSULE | Freq: Every day | ORAL | Status: DC

## 2011-05-14 MED ORDER — DIVALPROEX SODIUM ER 500 MG PO TB24: 500.0000 mg | ORAL_TABLET | Freq: Every day | ORAL | Status: DC

## 2011-05-14 NOTE — Discharge Summary (Deleted)
NAME:  Nathan [name], Nathan [name]             ACCOUNT NO.:  [id number]  MEDICAL RECORD NO.:  [id number]  LOCATION:                                FACILITY:  [facility]  PHYSICIAN:  Dwight Burdo Michael Michole Lecuyer, MDDATE OF BIRTH:  10/17/1960  DATE OF ADMISSION: DATE OF DISCHARGE:  LH                              DISCHARGE SUMMARY   The patient is a 51-year-old black male with known schizophrenia, bipolar disorder, depression, HIV positive with history of recent onset 1 year, non-insulin diabetes for which he was placed on metformin.  The patient came in with increasing lethargy over 1 week.  He was found to have a creatinine of 7.8 upon admission with acute onset of renal failure with that being normal within the last 9 months.  He was admitted, given aggressive fluid hydration.  His lithium level was grossly elevated, as was his Depakote level.  Both of these were stopped over an 8-9 day period.  He was seen in consultation by Dr. Befekadu, nephrologist.  With aggressive fluid repletion, hydration, his creatinine drifted down over this period of time to a discharge creatinine of 2.44.  He was hemodynamically stable throughout the entire hospital stay.  He became increasingly alert and communicative along with eating better as his creatinine normalized.  His lithium was discontinued as this may have affected some of his renal functioning as well as his metformin was discontinued 4 days prior to admission.  The patient was subsequently discharged on the following medications: 1. Rocaltrol 0.25 mcg p.o. daily. 2. Depakote ER 500 mg p.o. daily.  This is reduced dosage. 3. Reyataz 150 mg p.o. daily. 4. Klonopin 2 mg p.o. at bedtime. 5. Truvada 200/300 mg p.o. daily. 6. Ferrous sulfate 325 mg p.o. daily. 7. Neurontin 600 mg p.o. t.i.d. p.r.n. 8. Zyprexa 20 mg p.o. daily. 9. Zantac 150 mg p.o. daily. 10.Norvir 100 mg p.o. daily. 11.Flomax 0.4 mg p.o. daily. 12.He is told to stop taking Depakote ER 1000  mg at night, but to     continue 500 mg during the day. 13.He is told to stop taking lisinopril 10 mg p.o. daily. 14.He is told to stop taking lithium carbonate 450 mg CR p.o. b.i.d.  The patient follow up in the office in 4-5 days time for assessment of renal function and clinical status.     Jarone Ostergaard Michael Jaymeson Mengel, MD     RMD/MEDQ  D:  05/14/2011  T:  05/14/2011  Job:  [id number] 

## 2011-05-14 NOTE — Progress Notes (Signed)
Brief Nutrition Note:  LOS noted.   Unable to obtain nutrition hx due to pt' being very sleepy and mental retardation.  Pt tolerating 2 gm sodium diet well. PO intake 75-100% per RN. Chart reviewed. No nutritional intervention indicated at this time.  Karenann Cai Pager # [phone] Royann Shivers Pager [phone]

## 2011-05-14 NOTE — Discharge Summary (Signed)
Nathan [name], Nathan [name]             ACCOUNT NO.:  [id number]  MEDICAL RECORD NO.:  [id number]  LOCATION:                                FACILITY:  [facility]  PHYSICIAN:  [name], MDDATE OF BIRTH:  May 18, 1960  DATE OF ADMISSION: DATE OF DISCHARGE:  LH                              DISCHARGE SUMMARY   The patient is a 51 year old black male with known schizophrenia, bipolar disorder, depression, HIV positive with history of recent onset 1 year, non-insulin diabetes for which he was placed on metformin.  The patient came in with increasing lethargy over 1 week.  He was found to have a creatinine of 7.8 upon admission with acute onset of renal failure with that being normal within the last 9 months.  He was admitted, given aggressive fluid hydration.  His lithium level was grossly elevated, as was his Depakote level.  Both of these were stopped over an 8-9 day period.  He was seen in consultation by Dr. Kristian Covey, nephrologist.  With aggressive fluid repletion, hydration, his creatinine drifted down over this period of time to a discharge creatinine of 2.44.  He was hemodynamically stable throughout the entire hospital stay.  He became increasingly alert and communicative along with eating better as his creatinine normalized.  His lithium was discontinued as this may have affected some of his renal functioning as well as his metformin was discontinued 4 days prior to admission.  The patient was subsequently discharged on the following medications: 1. Rocaltrol 0.25 mcg p.o. daily. 2. Depakote ER 500 mg p.o. daily.  This is reduced dosage. 3. Reyataz 150 mg p.o. daily. 4. Klonopin 2 mg p.o. at bedtime. 5. Truvada 200/300 mg p.o. daily. 6. Ferrous sulfate 325 mg p.o. daily. 7. Neurontin 600 mg p.o. t.i.d. p.r.n. 8. Zyprexa 20 mg p.o. daily. 9. Zantac 150 mg p.o. daily. 10.Norvir 100 mg p.o. daily. 11.Flomax 0.4 mg p.o. daily. 12.He is told to stop taking Depakote ER 1000  mg at night, but to     continue 500 mg during the day. 13.He is told to stop taking lisinopril 10 mg p.o. daily. 14.He is told to stop taking lithium carbonate 450 mg CR p.o. b.i.d.  The patient follow up in the office in 4-5 days time for assessment of renal function and clinical status.     [name], MD     RMD/MEDQ  D:  05/14/2011  T:  05/14/2011  Job:  [id number]

## 2011-05-14 NOTE — Progress Notes (Signed)
Subjective: Interval History: none. Patient very sleepy barely arousable.  Objective: Vital signs in last 24 hours: Temp:  [97.6 F (36.4 C)-97.9 F (36.6 C)] 97.6 F (36.4 C) (02/05 0556) Pulse Rate:  [65-81] 65  (02/05 0556) Resp:  [18-20] 18  (02/05 0556) BP: (113-121)/(63-73) 113/73 mmHg (02/05 0556) SpO2:  [94 %-100 %] 100 % (02/05 0556) Weight:  [85.5 kg (188 lb 7.9 oz)] 85.5 kg (188 lb 7.9 oz) (02/05 0556) Weight change: 0.7 kg (1 lb 8.7 oz)  Intake/Output from previous day: 02/04 0701 - 02/05 0700 In: 3230 [P.O.:720; I.V.:2510] Out: 4775 [Urine:4775] Intake/Output this shift:    General appearance: no distress Resp: clear to auscultation bilaterally Cardio: regular rate and rhythm, S1, S2 normal, no murmur, click, rub or gallop GI: soft, non-tender; bowel sounds normal; no masses,  no organomegaly Extremities: extremities normal, atraumatic, no cyanosis or edema  Lab Results:  Basename 05/12/11 0448  WBC 9.4  HGB 9.5*  HCT 30.3*  PLT 220   BMET:  Basename 05/14/11 0507 05/13/11 0520  NA 143 146*  K 4.4 4.5  CL 113* 120*  CO2 22 21  GLUCOSE 178* 141*  BUN 28* 26*  CREATININE 2.44* 2.53*  CALCIUM 9.6 9.7   No results found for this basename: PTH:2 in the last 72 hours Iron Studies:  Basename 05/12/11 0448  IRON 76  TIBC 201*  TRANSFERRIN --  FERRITIN 828*    Studies/Results: No results found.  I have reviewed the patient's current medications.  Assessment/Plan: Problem #1 acute kidney injury superimposed on chronic BUN is 28 creatinine is 2.44 and renal function seems to be improving progressively. 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 Presently urine lites are pending. Problem #3 lithium toxicity that seems to have resolved Problem #4 history of schizophrenia Problem #5 history of bipolar disorder Problem  #6 history of HIV positive patient is on antiviral medications Problem #7 history of depression Problem #8 history of altered mental status patient is also the time very sleepy and accommodative. Plan: Continue his present management Basic metabolic panel in the morning.   LOS: 9 days   Nihira Puello S 05/14/2011,8:04 AM

## 2011-05-15 LAB — BASIC METABOLIC PANEL
BUN: 26 mg/dL — ABNORMAL HIGH (ref 6–23)
CO2: 23 mEq/L (ref 19–32)
Calcium: 9.2 mg/dL (ref 8.4–10.5)
Creatinine, Ser: 2.34 mg/dL — ABNORMAL HIGH (ref 0.50–1.35)
Glucose, Bld: 169 mg/dL — ABNORMAL HIGH (ref 70–99)

## 2011-05-15 MED ORDER — AMILORIDE HCL 5 MG PO TABS
10.0000 mg | ORAL_TABLET | Freq: Every day | ORAL | Status: DC
  Administered 2011-05-15: 10 mg via ORAL
  Filled 2011-05-15 (×3): qty 2

## 2011-05-15 NOTE — Progress Notes (Signed)
Pt stable and alert, packet given to person transporting back to [facility]. IV's removed.

## 2011-05-15 NOTE — Progress Notes (Signed)
Pt d/c today by MD back to [facility].  Pt's uncle and facility aware and agreeable.  D/C summary and FL2 faxed to [facility] and facility will transport pt.  Quita Skye, RN, reviewed medications on [facility] for accuracy.  MD order to follow d/c summary list and not AVS.  No other needs reported.    Karn Cassis

## 2011-05-15 NOTE — Progress Notes (Signed)
Medications from DC summary read back to MD, Medications from DC summary are correct, do not use med rec medications. Per telephone order from Dr Janna Arch.

## 2011-05-15 NOTE — Progress Notes (Signed)
Subjective: Interval History: has no complaint of nausea or vomiting. Patient doesn't have any difficulty in breathing..  Objective: Vital signs in last 24 hours: Temp:  [97.8 F (36.6 C)-98.3 F (36.8 C)] 97.8 F (36.6 C) (02/06 0607) Pulse Rate:  [72-76] 76  (02/06 0607) Resp:  [18-20] 18  (02/06 0607) BP: (106-112)/(67-74) 106/67 mmHg (02/06 0607) SpO2:  [92 %-97 %] 92 % (02/06 0607) Weight:  [87.3 kg (192 lb 7.4 oz)] 87.3 kg (192 lb 7.4 oz) (02/06 0607) Weight change: 1.8 kg (3 lb 15.5 oz)  Intake/Output from previous day: 02/05 0701 - 02/06 0700 In: 480 [P.O.:480] Out: 4050 [Urine:4050] Intake/Output this shift:    General appearance: alert and no distress Resp: clear to auscultation bilaterally Cardio: regular rate and rhythm, S1, S2 normal, no murmur, click, rub or gallop GI: soft, non-tender; bowel sounds normal; no masses,  no organomegaly Extremities: extremities normal, atraumatic, no cyanosis or edema  Lab Results: No results found for this basename: WBC:2,HGB:2,HCT:2,PLT:2 in the last 72 hours BMET:  [facility] 05/15/11 0530 05/14/11 0507  NA 136 143  K 3.8 4.4  CL 106 113*  CO2 23 22  GLUCOSE 169* 178*  BUN 26* 28*  CREATININE 2.34* 2.44*  CALCIUM 9.2 9.6   No results found for this basename: PTH:2 in the last 72 hours Iron Studies: No results found for this basename: IRON,TIBC,TRANSFERRIN,FERRITIN in the last 72 hours  Studies/Results: No results found.  I have reviewed the patient's current medications.  Assessment/Plan: Problem #1 renal failure this is acute injury superimposed on chronic BUN is 26 creatinine is 2.34 progressively improving. 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 Problem #4 history of schizophrenia Problem #6 history of bipolar disorder Problem #7 history of depression Problem #8 history of HIV  positive. Plan: Decrease his IV fluid to 55 cc per hour and the third be reduced or stopped when patient is going to be discharged. We'll check his basic metabolic panel tomorrow.    LOS: 10 days   Kaysa Roulhac S 05/15/2011,7:34 AM

## 2011-10-10 ENCOUNTER — Encounter (HOSPITAL_COMMUNITY): Payer: Self-pay | Admitting: Emergency Medicine

## 2011-10-10 ENCOUNTER — Emergency Department (HOSPITAL_COMMUNITY)
Admission: EM | Admit: 2011-10-10 | Discharge: 2011-10-10 | Disposition: A | Discharge status: Home / Self Care | Payer: Medicare Other | Attending: Emergency Medicine | Admitting: Emergency Medicine

## 2011-10-10 DIAGNOSIS — F209 Schizophrenia, unspecified: Secondary | ICD-10-CM | POA: Insufficient documentation

## 2011-10-10 DIAGNOSIS — Z87891 Personal history of nicotine dependence: Secondary | ICD-10-CM | POA: Insufficient documentation

## 2011-10-10 DIAGNOSIS — Z21 Asymptomatic human immunodeficiency virus [HIV] infection status: Secondary | ICD-10-CM | POA: Insufficient documentation

## 2011-10-10 DIAGNOSIS — F319 Bipolar disorder, unspecified: Secondary | ICD-10-CM | POA: Insufficient documentation

## 2011-10-10 DIAGNOSIS — D649 Anemia, unspecified: Secondary | ICD-10-CM | POA: Insufficient documentation

## 2011-10-10 DIAGNOSIS — R339 Retention of urine, unspecified: Secondary | ICD-10-CM | POA: Insufficient documentation

## 2011-10-10 HISTORY — DX: Toxic effect of other metals, accidental (unintentional), initial encounter: T56.891A

## 2011-10-10 HISTORY — DX: Hyperosmolality and hypernatremia: E87.0

## 2011-10-10 HISTORY — DX: Unspecified kidney failure: N19

## 2011-10-10 LAB — URINALYSIS, ROUTINE W REFLEX MICROSCOPIC
Glucose, UA: NEGATIVE mg/dL
Specific Gravity, Urine: <1.005 — ABNORMAL LOW (ref 1.005–1.030)
Urobilinogen, UA: 0.2 mg/dL (ref 0.0–1.0)

## 2011-10-10 LAB — URINE MICROSCOPIC-ADD ON

## 2011-10-10 MED ORDER — SULFAMETHOXAZOLE-TMP DS 800-160 MG PO TABS
1.0000 | ORAL_TABLET | Freq: Once | ORAL | Status: AC
  Administered 2011-10-10: 1 via ORAL
  Filled 2011-10-10: qty 1

## 2011-10-10 MED ORDER — SULFAMETHOXAZOLE-TRIMETHOPRIM 800-160 MG PO TABS: 1.0000 | ORAL_TABLET | Freq: Two times a day (BID) | ORAL | Status: AC

## 2011-10-10 NOTE — ED Notes (Signed)
Bladder scan shows 

## 2011-10-10 NOTE — ED Notes (Signed)
Patient complaining of abdominal pain x 3 months. States "I had trouble peeing this morning at 5:00. I could get some out but it was very hard to."

## 2011-10-10 NOTE — ED Provider Notes (Signed)
History    This chart was scribed for Ward Givens, MD, MD by Smitty Pluck. The patient was seen in room APA09 and the patient's care was started at 7:14AM.   CSN: [id number]  Arrival date & time 10/10/11  0605   First MD Initiated Contact with Patient 10/10/11 0701      Chief Complaint  Patient presents with  . Abdominal Pain  . Dysuria    (Consider location/radiation/quality/duration/timing/severity/associated sxs/prior treatment) The history is provided by the patient.  Pt is level 5 caveat due to MR Nathan [name] is a 51 y.o. male who presents to the Emergency Department complaining of moderate dysuria and abdominal pain around bladder onset 3 months ago. Pt reports having trouble passing his urine. He denies hematuria. Symptoms have been constant since onset. Denies nausea and trouble having bowel movements.   Pt denies smoking cigarettes and drinking alcohol. Pt lives in [facility] assisted living facility. Denies seeing urologist in the past for symptoms. Pt reports having medication changed recently.   PCP is Dondiego   Past Medical History  Diagnosis Date  . HIV (human immunodeficiency virus infection)   . Schizophrenia   . MR (mental retardation)   . Anemia   . Anxiety   . Bipolar 1 disorder   . Depression, major   . Lithium toxicity   . Kidney failure   . Hypernatremia     History reviewed. No pertinent past surgical history.  History reviewed. No pertinent family history.  History  Substance Use Topics  . Smoking status: Former Games developer  . Smokeless tobacco: Not on file  . Alcohol Use: No  lives in assisted living    Review of Systems  Unable to perform ROS: Other     Allergies  Review of patient's allergies indicates no known allergies.  Home Medications   Current Outpatient Rx  Name Route Sig Dispense Refill  . ATAZANAVIR SULFATE 150 MG PO CAPS Oral Take 300 mg by mouth daily with breakfast.    . CALCITRIOL 0.25 MCG PO CAPS Oral Take 1  capsule (0.25 mcg total) by mouth daily. 30 capsule 3  . CLONAZEPAM 2 MG PO TABS Oral Take 2 mg by mouth at bedtime.    Marland Kitchen DIVALPROEX SODIUM ER 500 MG PO TB24 Oral Take 1 tablet (500 mg total) by mouth daily. 30 tablet 2  . EMTRICITABINE-TENOFOVIR 200-300 MG PO TABS Oral Take 1 tablet by mouth daily.    Marland Kitchen FERROUS SULFATE 325 (65 FE) MG PO TABS Oral Take 325 mg by mouth 3 (three) times daily with meals.    Marland Kitchen GABAPENTIN 600 MG PO TABS Oral Take 600 mg by mouth 2 (two) times daily.    Marland Kitchen OLANZAPINE 20 MG PO TABS Oral Take 20 mg by mouth at bedtime.    Marland Kitchen RANITIDINE HCL 150 MG PO TABS Oral Take 150 mg by mouth 2 (two) times daily.    Marland Kitchen RITONAVIR 100 MG PO TABS Oral Take 100 mg by mouth daily with breakfast.    . TAMSULOSIN HCL 0.4 MG PO CAPS Oral Take 0.4 mg by mouth daily.      BP 124/65  Pulse 60  Temp 98 F (36.7 C) (Oral)  Resp 16  Ht 5\' 9"  (1.753 m)  Wt 180 lb (81.647 kg)  BMI 26.58 kg/m2  SpO2 99%  Vital signs normal    Physical Exam  Nursing note and vitals reviewed. Constitutional: He is oriented to person, place, and time. He appears well-developed and  well-nourished. No distress.       Seems MR  HENT:  Head: Normocephalic and atraumatic.  Right Ear: External ear normal.  Left Ear: External ear normal.  Nose: Nose normal.       Missing multiple teeth Tongue is dry   Eyes: Conjunctivae and EOM are normal. Pupils are equal, round, and reactive to light.  Neck: Normal range of motion. Neck supple.  Cardiovascular: Normal rate, regular rhythm and normal heart sounds.   Pulmonary/Chest: Effort normal and breath sounds normal. No respiratory distress.  Abdominal: Soft. He exhibits no distension. There is tenderness (suprapubic ). There is no rebound and no guarding.       Very active bowel sounds, fullness in suprapubic area   Neurological: He is alert and oriented to person, place, and time.       Mild tremor   Skin: Skin is warm and dry.  Psychiatric: He has a normal mood  and affect. His behavior is normal.    ED Course  Procedures (including critical care time)   Medications  sulfamethoxazole-trimethoprim (BACTRIM DS) 800-160 MG per tablet 1 tablet (not administered)     Bladder scan shows 723 cc.  Foley catheter inserted. Pt has over 700 of clear urine.    DIAGNOSTIC STUDIES: Oxygen Saturation is 99% on room air, normal by my interpretation.    COORDINATION OF CARE: 7:22AM EDP discusses pt ED treatment with pt.  7:55AM EDP rechecks pt and discusses catheter placement with pt.      Results for orders placed during the hospital encounter of 10/10/11  URINALYSIS, ROUTINE W REFLEX MICROSCOPIC      Component Value Range   Color, Urine STRAW (*) YELLOW   APPearance CLEAR  CLEAR   Specific Gravity, Urine <1.005 (*) 1.005 - 1.030   pH 7.0  5.0 - 8.0   Glucose, UA NEGATIVE  NEGATIVE mg/dL   Hgb urine dipstick TRACE (*) NEGATIVE   Bilirubin Urine NEGATIVE  NEGATIVE   Ketones, ur NEGATIVE  NEGATIVE mg/dL   Protein, ur NEGATIVE  NEGATIVE mg/dL   Urobilinogen, UA 0.2  0.0 - 1.0 mg/dL   Nitrite NEGATIVE  NEGATIVE   Leukocytes, UA NEGATIVE  NEGATIVE  URINE MICROSCOPIC-ADD ON      Component Value Range   Squamous Epithelial / LPF RARE  RARE   WBC, UA 0-2  <3 WBC/hpf   RBC / HPF 0-2  <3 RBC/hpf   Bacteria, UA RARE  RARE   Laboratory interpretation all normal      1. Urinary retention    New Prescriptions   SULFAMETHOXAZOLE-TRIMETHOPRIM (SEPTRA DS) 800-160 MG PER TABLET    Take 1 tablet by mouth 2 (two) times daily.    Plan discharge  Devoria Albe, MD, FACEP    MDM   I personally performed the services described in this documentation, which was scribed in my presence. The recorded information has been reviewed and considered. Devoria Albe, MD, Armando Gang   Ward Givens, MD 10/10/11 [phone]

## 2011-10-10 NOTE — ED Notes (Signed)
States that he is having trouble passing urine this morning, states he is able to pass urine, but it is difficult for him to do so.

## 2011-10-10 NOTE — ED Notes (Signed)
Patient attempted to provide urine sample, unable to urinate at present.  Patient states that he does not feel like he has to urinate right now.

## 2011-10-12 ENCOUNTER — Encounter (HOSPITAL_COMMUNITY): Payer: Self-pay | Admitting: *Deleted

## 2011-10-12 ENCOUNTER — Emergency Department (HOSPITAL_COMMUNITY)
Admission: EM | Admit: 2011-10-12 | Discharge: 2011-10-12 | Disposition: A | Discharge status: Home / Self Care | Payer: Medicare Other | Attending: Emergency Medicine | Admitting: Emergency Medicine

## 2011-10-12 DIAGNOSIS — Y846 Urinary catheterization as the cause of abnormal reaction of the patient, or of later complication, without mention of misadventure at the time of the procedure: Secondary | ICD-10-CM | POA: Insufficient documentation

## 2011-10-12 DIAGNOSIS — Z21 Asymptomatic human immunodeficiency virus [HIV] infection status: Secondary | ICD-10-CM | POA: Insufficient documentation

## 2011-10-12 DIAGNOSIS — F411 Generalized anxiety disorder: Secondary | ICD-10-CM | POA: Insufficient documentation

## 2011-10-12 DIAGNOSIS — T83091A Other mechanical complication of indwelling urethral catheter, initial encounter: Secondary | ICD-10-CM | POA: Insufficient documentation

## 2011-10-12 DIAGNOSIS — R339 Retention of urine, unspecified: Secondary | ICD-10-CM | POA: Insufficient documentation

## 2011-10-12 DIAGNOSIS — F319 Bipolar disorder, unspecified: Secondary | ICD-10-CM | POA: Insufficient documentation

## 2011-10-12 DIAGNOSIS — F209 Schizophrenia, unspecified: Secondary | ICD-10-CM | POA: Insufficient documentation

## 2011-10-12 DIAGNOSIS — T839XXA Unspecified complication of genitourinary prosthetic device, implant and graft, initial encounter: Secondary | ICD-10-CM

## 2011-10-12 NOTE — ED Provider Notes (Signed)
History  This chart was scribed for Nathan Gaskins, MD by Erskine Emery. This patient was seen in room APA01/APA01 and the patient's care was started at 9:55AM.  CSN: [id number]  Arrival date & time 10/12/11  0944   First MD Initiated Contact with Patient 10/12/11 [phone]      Chief Complaint  Patient presents with  . foley leaking     The history is provided by the patient. No language interpreter was used.    Nathan [name] is a 51 y.o. male who presents to the Emergency Department complaining of his catheter is leaking everywhere he walks, and pain when he sits down to have a bowel movement that he noticed this morning. Pt is unable to follow up with urologist due to holiday weekend. Pt denies fever, emesis, and back and abdominal pain.  No new weakness is reported Past Medical History  Diagnosis Date  . HIV (human immunodeficiency virus infection)   . Schizophrenia   . MR (mental retardation)   . Anemia   . Anxiety   . Bipolar 1 disorder   . Depression, major   . Lithium toxicity   . Kidney failure   . Hypernatremia     History reviewed. No pertinent past surgical history.  No family history on file.  History  Substance Use Topics  . Smoking status: Former Games developer  . Smokeless tobacco: Not on file  . Alcohol Use: No      Review of Systems  Constitutional: Negative for fever and chills.  Gastrointestinal: Negative for nausea and vomiting.  Genitourinary: Negative for penile swelling and penile pain.       Foley leaking  All other systems reviewed and are negative.    Allergies  Review of patient's allergies indicates no known allergies.  Home Medications   Current Outpatient Rx  Name Route Sig Dispense Refill  . ATAZANAVIR SULFATE 150 MG PO CAPS Oral Take 300 mg by mouth daily with breakfast.    . CLONAZEPAM 2 MG PO TABS Oral Take 2 mg by mouth at bedtime.    Marland Kitchen DIVALPROEX SODIUM ER 500 MG PO TB24 Oral Take 500 mg by mouth at bedtime.    Marland Kitchen  EMTRICITABINE-TENOFOVIR 200-300 MG PO TABS Oral Take 1 tablet by mouth daily.    Marland Kitchen FERROUS SULFATE 325 (65 FE) MG PO TABS Oral Take 325 mg by mouth daily.     Marland Kitchen GABAPENTIN 600 MG PO TABS Oral Take 600 mg by mouth 3 (three) times daily.     Marland Kitchen LITHIUM CARBONATE ER 450 MG PO TBCR Oral Take 450 mg by mouth 2 (two) times daily.    Marland Kitchen OLANZAPINE 20 MG PO TABS Oral Take 20 mg by mouth at bedtime.    Marland Kitchen RANITIDINE HCL 150 MG PO TABS Oral Take 150 mg by mouth daily.     Marland Kitchen RITONAVIR 100 MG PO TABS Oral Take 100 mg by mouth daily with breakfast.    . SULFAMETHOXAZOLE-TRIMETHOPRIM 800-160 MG PO TABS Oral Take 1 tablet by mouth 2 (two) times daily. 30 tablet 0  . TAMSULOSIN HCL 0.4 MG PO CAPS Oral Take 0.4 mg by mouth daily.      Triage Vitals: BP 112/80  Pulse 76  Temp 98.2 F (36.8 C) (Oral)  Resp 16  SpO2 99%  Physical Exam  Nursing note and vitals reviewed.   CONSTITUTIONAL: Well developed/well nourished HEAD AND FACE: Normocephalic/atraumatic EYES: EOMI/PERRL ENMT: Mucous membranes moist, poor dentition NECK: supple no meningeal signs SPINE:entire spine  nontender CV: S1/S2 noted, no murmurs/rubs/gallops noted LUNGS: Lungs are clear to auscultation bilaterally, no apparent distress ABDOMEN: soft, nontender, no rebound or guarding GU:no cva tenderness, catheter in place and flowing yellow urine, no signs of trauma to penis.  Scrotum nontender.  Chaperone present.  It is not leaking NEURO: Pt is awake/alert, moves all extremitiesx4, no focal motor deficits noted EXTREMITIES: pulses normal, full ROM SKIN: warm, color normal PSYCH: no abnormalities of mood noted    ED Course  Procedures  DIAGNOSTIC STUDIES: Oxygen Saturation is 99% on room air, normal by my interpretation.    COORDINATION OF CARE:  10:06AM-I discussed treatment plan of changing the catheter bag with the pt and the pt agreed. I informed pt to follow up with the urologist.      MDM  Nursing notes including past  medical history and social history reviewed and considered in documentation Previous records reviewed and considered - recent ED visit for urinary retention apparently has h/o this in the past.    I personally performed the services described in this documentation, which was scribed in my presence. The recorded information has been reviewed and considered.          Nathan Gaskins, MD 10/12/11 [phone]

## 2011-10-12 NOTE — ED Notes (Signed)
Pt states that his leg bag is leaking. Woke up this morning with wet bed and also state catheter is "pulling" when he sits down. NAD

## 2011-10-21 ENCOUNTER — Other Ambulatory Visit (HOSPITAL_COMMUNITY): Payer: Self-pay | Admitting: Urology

## 2011-10-21 DIAGNOSIS — R338 Other retention of urine: Secondary | ICD-10-CM

## 2011-10-23 ENCOUNTER — Ambulatory Visit (HOSPITAL_COMMUNITY)
Admission: RE | Admit: 2011-10-23 | Discharge: 2011-10-23 | Disposition: A | Discharge status: Home / Self Care | Payer: PRIVATE HEALTH INSURANCE | Source: Ambulatory Visit | Attending: Urology | Admitting: Urology

## 2011-10-23 DIAGNOSIS — N400 Enlarged prostate without lower urinary tract symptoms: Secondary | ICD-10-CM | POA: Insufficient documentation

## 2011-10-23 DIAGNOSIS — R338 Other retention of urine: Secondary | ICD-10-CM | POA: Insufficient documentation

## 2012-02-19 ENCOUNTER — Emergency Department (HOSPITAL_COMMUNITY)
Admission: EM | Admit: 2012-02-19 | Discharge: 2012-02-19 | Disposition: A | Discharge status: Home / Self Care | Payer: PRIVATE HEALTH INSURANCE | Attending: Emergency Medicine | Admitting: Emergency Medicine

## 2012-02-19 ENCOUNTER — Encounter (HOSPITAL_COMMUNITY): Payer: Self-pay | Admitting: *Deleted

## 2012-02-19 DIAGNOSIS — Z79899 Other long term (current) drug therapy: Secondary | ICD-10-CM | POA: Insufficient documentation

## 2012-02-19 DIAGNOSIS — M79609 Pain in unspecified limb: Secondary | ICD-10-CM | POA: Insufficient documentation

## 2012-02-19 DIAGNOSIS — F319 Bipolar disorder, unspecified: Secondary | ICD-10-CM | POA: Insufficient documentation

## 2012-02-19 DIAGNOSIS — D649 Anemia, unspecified: Secondary | ICD-10-CM | POA: Insufficient documentation

## 2012-02-19 DIAGNOSIS — F3289 Other specified depressive episodes: Secondary | ICD-10-CM | POA: Insufficient documentation

## 2012-02-19 DIAGNOSIS — F209 Schizophrenia, unspecified: Secondary | ICD-10-CM | POA: Insufficient documentation

## 2012-02-19 DIAGNOSIS — F79 Unspecified intellectual disabilities: Secondary | ICD-10-CM | POA: Insufficient documentation

## 2012-02-19 DIAGNOSIS — F411 Generalized anxiety disorder: Secondary | ICD-10-CM | POA: Insufficient documentation

## 2012-02-19 DIAGNOSIS — F329 Major depressive disorder, single episode, unspecified: Secondary | ICD-10-CM | POA: Insufficient documentation

## 2012-02-19 DIAGNOSIS — Z87448 Personal history of other diseases of urinary system: Secondary | ICD-10-CM | POA: Insufficient documentation

## 2012-02-19 DIAGNOSIS — M79601 Pain in right arm: Secondary | ICD-10-CM

## 2012-02-19 DIAGNOSIS — B20 Human immunodeficiency virus [HIV] disease: Secondary | ICD-10-CM | POA: Insufficient documentation

## 2012-02-19 NOTE — ED Notes (Addendum)
Pain rt upper arm, no injury, no visible trauma, Good radial pulse, "feels like it will blow up"  Pt is from [facility]. And has a care taker with him.  No pain at present.

## 2012-02-19 NOTE — ED Provider Notes (Signed)
History   This chart was scribed for Donnetta Hutching, MD by Charolett Bumpers, ER Scribe. The patient was seen in room APA04/APA04. Patient's care was started at 12:20.   CSN: [id number]  Arrival date & time 02/19/12  1122  First MD Initiated Contact with Patient 02/19/12 12:20    Chief Complaint  Patient presents with  . Arm Pain    The history is provided by the patient and a caregiver. No language interpreter was used.   Nathan [name] is a 51 y.o. male who presents to the Emergency Department complaining of intermittent, moderate right upper arm pain with associated swelling for the past couple of days. He denies any known injuries or trauma. Pt is from [facility] and is here with a caretaker. She states he last complained of pain this morning. Pt states that he is not currently experiencing pain.   Past Medical History  Diagnosis Date  . HIV (human immunodeficiency virus infection)   . Schizophrenia   . MR (mental retardation)   . Anemia   . Anxiety   . Bipolar 1 disorder   . Depression, major   . Lithium toxicity   . Hypernatremia   . Kidney failure   . Anemia     History reviewed. No pertinent past surgical history.  History reviewed. No pertinent family history.  History  Substance Use Topics  . Smoking status: Former Games developer  . Smokeless tobacco: Not on file  . Alcohol Use: No      Review of Systems A complete 10 system review of systems was obtained and all systems are negative except as noted in the HPI and PMH.   Allergies  Review of patient's allergies indicates no known allergies.  Home Medications   Current Outpatient Rx  Name  Route  Sig  Dispense  Refill  . ATAZANAVIR SULFATE 150 MG PO CAPS   Oral   Take 300 mg by mouth daily with breakfast.         . CALCITRIOL 0.5 MCG PO CAPS   Oral   Take 0.25 mcg by mouth daily.         Marland Kitchen DIVALPROEX SODIUM ER 500 MG PO TB24   Oral   Take 500 mg by mouth at bedtime.         Marland Kitchen  EMTRICITABINE-TENOFOVIR 200-300 MG PO TABS   Oral   Take 1 tablet by mouth daily.         Marland Kitchen FERROUS SULFATE 325 (65 FE) MG PO TABS   Oral   Take 325 mg by mouth daily.          Marland Kitchen GABAPENTIN 600 MG PO TABS   Oral   Take 600 mg by mouth 3 (three) times daily.          Marland Kitchen HYDROCODONE-ACETAMINOPHEN 5-325 MG PO TABS   Oral   Take 1 tablet by mouth every 4 (four) hours as needed. pain         . LITHIUM CARBONATE ER 450 MG PO TBCR   Oral   Take 450 mg by mouth 2 (two) times daily.         Marland Kitchen OLANZAPINE 20 MG PO TABS   Oral   Take 20 mg by mouth at bedtime.         Marland Kitchen RANITIDINE HCL 150 MG PO TABS   Oral   Take 150 mg by mouth daily.          Marland Kitchen RITONAVIR 100 MG PO TABS  Oral   Take 100 mg by mouth daily with breakfast.         . TAMSULOSIN HCL 0.4 MG PO CAPS   Oral   Take 0.4 mg by mouth daily.         Marland Kitchen CLONAZEPAM 2 MG PO TABS   Oral   Take 2 mg by mouth at bedtime.           BP 113/58  Pulse 62  Temp 97.8 F (36.6 C) (Oral)  Resp 20  Ht 5\' 9"  (1.753 m)  Wt 191 lb 5 oz (86.779 kg)  BMI 28.25 kg/m2  SpO2 99%  Physical Exam  Nursing note and vitals reviewed. Constitutional: He is oriented to person, place, and time. He appears well-developed and well-nourished.  HENT:  Head: Normocephalic and atraumatic.  Eyes: Conjunctivae normal and EOM are normal. Pupils are equal, round, and reactive to light.  Neck: Normal range of motion. Neck supple.  Cardiovascular: Normal rate, regular rhythm and normal heart sounds.   Pulmonary/Chest: Effort normal and breath sounds normal.  Abdominal: Soft. Bowel sounds are normal.  Musculoskeletal: Normal range of motion.       Full ROM of right arm, non-edematous.   Neurological: He is alert and oriented to person, place, and time.  Skin: Skin is warm and dry.  Psychiatric: He has a normal mood and affect.    ED Course  Procedures (including critical care time)  DIAGNOSTIC STUDIES: Oxygen Saturation is 99%  on room air, normal by my interpretation.    COORDINATION OF CARE:  12:28-Discussed planned course of treatment with the patient including d/c back to facility, who is agreeable at this time.   Labs Reviewed - No data to display No results found.   No diagnosis found.    MDM  Normal exam of right upper extremity. Full range of motion of arm. Neurovascular intact. No edema.  No tenderness    I personally performed the services described in this documentation, which was scribed in my presence. The recorded information has been reviewed and is accurate.      Donnetta Hutching, MD 02/19/12 1336

## 2012-06-10 ENCOUNTER — Telehealth: Payer: Self-pay

## 2012-06-10 NOTE — Telephone Encounter (Signed)
Pt resides with [facility] and is not able to come for intake and OV.  Pt was given appointment with physician and labs can be done same day.   Pt is currently on AART.   Laurell Josephs, RN

## 2012-06-19 ENCOUNTER — Encounter: Payer: Self-pay | Admitting: Infectious Disease

## 2012-06-19 ENCOUNTER — Ambulatory Visit (INDEPENDENT_AMBULATORY_CARE_PROVIDER_SITE_OTHER): Payer: PRIVATE HEALTH INSURANCE | Admitting: Infectious Disease

## 2012-06-19 VITALS — BP 114/76 | HR 76 | Temp 97.8°F | Ht 69.0 in | Wt 198.0 lb

## 2012-06-19 DIAGNOSIS — B20 Human immunodeficiency virus [HIV] disease: Secondary | ICD-10-CM | POA: Insufficient documentation

## 2012-06-19 DIAGNOSIS — F79 Unspecified intellectual disabilities: Secondary | ICD-10-CM | POA: Insufficient documentation

## 2012-06-19 DIAGNOSIS — F3162 Bipolar disorder, current episode mixed, moderate: Secondary | ICD-10-CM | POA: Insufficient documentation

## 2012-06-19 DIAGNOSIS — E871 Hypo-osmolality and hyponatremia: Secondary | ICD-10-CM

## 2012-06-19 LAB — COMPLETE METABOLIC PANEL WITH GFR
AST: 23 U/L (ref 0–37)
BUN: 17 mg/dL (ref 6–23)
Calcium: 9.9 mg/dL (ref 8.4–10.5)
Chloride: 109 mEq/L (ref 96–112)
Creat: 2.45 mg/dL — ABNORMAL HIGH (ref 0.50–1.35)
GFR, Est African American: 33 mL/min — ABNORMAL LOW
GFR, Est Non African American: 29 mL/min — ABNORMAL LOW

## 2012-06-19 LAB — T-HELPER CELL (CD4) - (RCID CLINIC ONLY)
CD4 % Helper T Cell: 33 % (ref 33–55)
CD4 T Cell Abs: 530 uL (ref 400–2700)

## 2012-06-19 LAB — CBC WITH DIFFERENTIAL/PLATELET
Lymphocytes Relative: 16 % (ref 12–46)
Lymphs Abs: 1.6 10*3/uL (ref 0.7–4.0)
MCV: 84.3 fL (ref 78.0–100.0)
Neutro Abs: 7.1 10*3/uL (ref 1.7–7.7)
Neutrophils Relative %: 73 % (ref 43–77)
Platelets: 153 10*3/uL (ref 150–400)
RBC: 5.03 MIL/uL (ref 4.22–5.81)
WBC: 9.8 10*3/uL (ref 4.0–10.5)

## 2012-06-19 MED ORDER — ATAZANAVIR SULFATE 300 MG PO CAPS: 300.0000 mg | ORAL_CAPSULE | Freq: Every day | ORAL | Status: DC

## 2012-06-19 NOTE — Patient Instructions (Addendum)
I would like to get records form [facility]  As well as your vaccination records incluidng your time at [facility]  I am changing your reyataz to 300mg  capsule for pill convenience

## 2012-06-19 NOTE — Progress Notes (Signed)
Subjective:    Patient ID: Nathan [name], male    DOB: 1961/03/28, 52 y.o.   MRN: [id number]  HPI  52 year old [place] American man with bipolar disorder mental retardation who is Dx with HIV in the 1990s. Was in care at [facility]. Pt moved to [place] in 2010 when he moved into [place]. He not been seeing an infectious disease specialist since then but has been seeing a primary care physician . He is also seeing a psychiatrist and a dentist. He is referred to our clinic and it has not yet had labs. He has been on a regimen of on reyataz, norvir and truvada, but I do not have labs or recent vaccination data.  The patient self feels relatively well and has no specific complaints today.  I spent greater than 60 minutes with the patient including greater than 50% of time in face to face counsel of the patient and in coordination of their care including obtaining records.    Review of Systems  Constitutional: Negative for fever, chills, diaphoresis, activity change, appetite change, fatigue and unexpected weight change.  HENT: Negative for congestion, sore throat, rhinorrhea, sneezing, trouble swallowing and sinus pressure.   Eyes: Negative for photophobia and visual disturbance.  Respiratory: Negative for cough, chest tightness, shortness of breath, wheezing and stridor.   Cardiovascular: Negative for chest pain, palpitations and leg swelling.  Gastrointestinal: Negative for nausea, vomiting, abdominal pain, diarrhea, constipation, blood in stool, abdominal distention and anal bleeding.  Genitourinary: Negative for dysuria, hematuria, flank pain and difficulty urinating.  Musculoskeletal: Negative for myalgias, back pain, joint swelling, arthralgias and gait problem.  Skin: Negative for color change, pallor, rash and wound.  Neurological: Negative for dizziness, tremors, weakness and light-headedness.  Hematological: Negative for adenopathy. Does not bruise/bleed easily.   Psychiatric/Behavioral: Negative for behavioral problems, confusion, sleep disturbance, dysphoric mood, decreased concentration and agitation.       Objective:   Physical Exam  Constitutional: He is oriented to person, place, and time. He appears well-developed and well-nourished. No distress.  HENT:  Head: Normocephalic and atraumatic.  Mouth/Throat: Oropharynx is clear and moist. No oropharyngeal exudate.  Eyes: Conjunctivae and EOM are normal. Pupils are equal, round, and reactive to light. No scleral icterus.  Neck: Normal range of motion. Neck supple. No JVD present.  Cardiovascular: Normal rate, regular rhythm and normal heart sounds.  Exam reveals no gallop and no friction rub.   No murmur heard. Pulmonary/Chest: Effort normal and breath sounds normal. No respiratory distress. He has no wheezes. He has no rales. He exhibits no tenderness.  Abdominal: He exhibits no distension and no mass. There is no tenderness. There is no rebound and no guarding.  Musculoskeletal: He exhibits no edema and no tenderness.  Lymphadenopathy:    He has no cervical adenopathy.  Neurological: He is alert and oriented to person, place, and time. He has normal reflexes. He exhibits normal muscle tone. Coordination normal.  Skin: Skin is warm and dry. He is not diaphoretic. No erythema. No pallor.  Psychiatric: He has a normal mood and affect. Judgment and thought content normal. He is slowed. Cognition and memory are impaired.          Assessment & Plan:  HIV: Viral load CD4 count and basic labs. Continue Reyataz Norvir and Truvada but change Reyataz formulation to 300 mg. Bring back in one month's time. Get records from [facility]. Get vaccination records from [facility] and from his assisted living facility.  Bipolar disorder  currently on medications in stable. Did not see drug interactions with his antiretrovirals at present.  Mental retardation: appears that he is doing well in his assisted  living.

## 2012-06-22 LAB — HIV-1 RNA ULTRAQUANT REFLEX TO GENTYP+: HIV 1 RNA Quant: <20 copies/mL (ref <20)

## 2012-06-24 ENCOUNTER — Telehealth: Payer: Self-pay | Admitting: Infectious Disease

## 2012-06-24 NOTE — Telephone Encounter (Signed)
Tamika can we make sure that Mr Babington is taking the ranitidine tablets 12 hours AFTER his reyataz. The duodenum other antacids inhibit the absorption of his Reyataz.

## 2012-07-02 ENCOUNTER — Other Ambulatory Visit: Payer: Self-pay | Admitting: Infectious Disease

## 2012-07-03 NOTE — Telephone Encounter (Signed)
Yes I called the patient and he states he is taking it correctly.

## 2012-07-22 ENCOUNTER — Ambulatory Visit (INDEPENDENT_AMBULATORY_CARE_PROVIDER_SITE_OTHER): Payer: PRIVATE HEALTH INSURANCE | Admitting: Infectious Disease

## 2012-07-22 ENCOUNTER — Encounter: Payer: Self-pay | Admitting: Infectious Disease

## 2012-07-22 VITALS — BP 127/80 | HR 66 | Temp 97.9°F | Ht 69.0 in | Wt 202.0 lb

## 2012-07-22 DIAGNOSIS — B20 Human immunodeficiency virus [HIV] disease: Secondary | ICD-10-CM

## 2012-07-22 DIAGNOSIS — F79 Unspecified intellectual disabilities: Secondary | ICD-10-CM

## 2012-07-22 LAB — BASIC METABOLIC PANEL WITH GFR
CO2: 28 mEq/L (ref 19–32)
Calcium: 9.2 mg/dL (ref 8.4–10.5)
Chloride: 108 mEq/L (ref 96–112)
Creat: 2.17 mg/dL — ABNORMAL HIGH (ref 0.50–1.35)
Glucose, Bld: 89 mg/dL (ref 70–99)

## 2012-07-22 MED ORDER — ZIDOVUDINE 300 MG PO TABS: 300.0000 mg | ORAL_TABLET | Freq: Two times a day (BID) | ORAL | Status: DC

## 2012-07-22 MED ORDER — LAMIVUDINE 150 MG PO TABS: 150.0000 mg | ORAL_TABLET | Freq: Every day | ORAL | Status: DC

## 2012-07-22 NOTE — Patient Instructions (Signed)
YOur new regimen is:  Retrovir (AZT) 300mg  BID  Epivir (lamivudine) 150mg  daily  Reyataz 300mg  daily  Norvir 100mg  daily  We need to have you seen by a Kidney Doctor

## 2012-07-22 NOTE — Progress Notes (Signed)
Subjective:    Patient ID: Nathan [name], male    DOB: 1960/04/18, 52 y.o.   MRN: [id number]  HPI   52 year old [place] American man with bipolar disorder mental retardation who is Dx with HIV in the 1990s. Was in care at [facility]. Pt moved to [place] in 2010 when he moved into [facility]. He was not been seeing an infectious disease specialist since then until he saw me approx a month ago. He has been on reyataz, norvir and truvada  Reviewed the patient's labs this recently has indicated that he has been suffering from renal insufficiency and his GFR has dropped in the 30s. This apparently happened during an admission to the hospital in February was evident on labs and recheck here in clinic. Therefore need to change him off of the tenofovir-based regimen and will do so today.  I will change him over instead to twice daily Retrovir and once daily limited he been while continuing his Reyataz and Norvir.  I spent greater than 45 minutes with the patient including greater than 50% of time in face to face counsel of the patient and in coordination of their care including obtaining records.    Review of Systems  Constitutional: Negative for fever, chills, diaphoresis, activity change, appetite change, fatigue and unexpected weight change.  HENT: Negative for congestion, sore throat, rhinorrhea, sneezing, trouble swallowing and sinus pressure.   Eyes: Negative for photophobia and visual disturbance.  Respiratory: Negative for cough, chest tightness, shortness of breath, wheezing and stridor.   Cardiovascular: Negative for chest pain, palpitations and leg swelling.  Gastrointestinal: Negative for nausea, vomiting, abdominal pain, diarrhea, constipation, blood in stool, abdominal distention and anal bleeding.  Genitourinary: Negative for dysuria, hematuria, flank pain and difficulty urinating.  Musculoskeletal: Negative for myalgias, back pain, joint swelling, arthralgias and gait problem.   Skin: Negative for color change, pallor, rash and wound.  Neurological: Negative for dizziness, tremors, weakness and light-headedness.  Hematological: Negative for adenopathy. Does not bruise/bleed easily.  Psychiatric/Behavioral: Negative for behavioral problems, confusion, sleep disturbance, dysphoric mood, decreased concentration and agitation.       Objective:   Physical Exam  Constitutional: He is oriented to person, place, and time. He appears well-developed and well-nourished. No distress.  HENT:  Head: Normocephalic and atraumatic.  Mouth/Throat: Oropharynx is clear and moist. No oropharyngeal exudate.  Eyes: Conjunctivae and EOM are normal. Pupils are equal, round, and reactive to light. No scleral icterus.  Neck: Normal range of motion. Neck supple. No JVD present.  Cardiovascular: Normal rate, regular rhythm and normal heart sounds.  Exam reveals no gallop and no friction rub.   No murmur heard. Pulmonary/Chest: Effort normal and breath sounds normal. No respiratory distress. He has no wheezes. He has no rales. He exhibits no tenderness.  Abdominal: He exhibits no distension and no mass. There is no tenderness. There is no rebound and no guarding.  Musculoskeletal: He exhibits no edema and no tenderness.  Lymphadenopathy:    He has no cervical adenopathy.  Neurological: He is alert and oriented to person, place, and time. He has normal reflexes. He exhibits normal muscle tone. Coordination normal.  Skin: Skin is warm and dry. He is not diaphoretic. No erythema. No pallor.  Psychiatric: He has a normal mood and affect. Judgment and thought content normal. He is slowed. Cognition and memory are impaired.          Assessment & Plan:  HIV: Suppressed but given drop in GFR, needs come off of  nonsevere Continue Reyataz Norvir, DC to not clear and Changed to twice daily AZT with Epivir once daily. Consider a block of year-based regimen once we have HLA tested him.  Chronic  kidney disease: Patient has a diminished GFR since February: He had renal ultrasound done last July the did not show any evidence for obstructive reason for renal insufficiency. Recheck urinalysis and labs today and phosphorus and referred to nephrology.  Mental retardation: appears that he is doing well in his assisted living.

## 2012-07-23 LAB — URINALYSIS
Bilirubin Urine: NEGATIVE
Glucose, UA: NEGATIVE mg/dL
Ketones, ur: NEGATIVE mg/dL
Protein, ur: NEGATIVE mg/dL
Urobilinogen, UA: 1 mg/dL (ref 0.0–1.0)

## 2012-07-23 LAB — MICROALBUMIN / CREATININE URINE RATIO
Creatinine, Urine: 25.1 mg/dL
Microalb, Ur: 2.24 mg/dL — ABNORMAL HIGH (ref 0.00–1.89)

## 2012-07-28 LAB — HLA B*5701: HLA-B*5701: NEGATIVE

## 2012-08-03 ENCOUNTER — Other Ambulatory Visit: Payer: Self-pay | Admitting: Infectious Disease

## 2012-09-02 ENCOUNTER — Ambulatory Visit: Payer: PRIVATE HEALTH INSURANCE | Admitting: Infectious Disease

## 2012-09-07 ENCOUNTER — Ambulatory Visit (INDEPENDENT_AMBULATORY_CARE_PROVIDER_SITE_OTHER): Payer: PRIVATE HEALTH INSURANCE | Admitting: Infectious Disease

## 2012-09-07 ENCOUNTER — Encounter: Payer: Self-pay | Admitting: Infectious Disease

## 2012-09-07 VITALS — BP 122/70 | HR 73 | Temp 98.2°F | Ht 69.0 in | Wt 193.0 lb

## 2012-09-07 DIAGNOSIS — B20 Human immunodeficiency virus [HIV] disease: Secondary | ICD-10-CM

## 2012-09-07 DIAGNOSIS — F79 Unspecified intellectual disabilities: Secondary | ICD-10-CM

## 2012-09-07 LAB — BASIC METABOLIC PANEL WITH GFR
BUN: 15 mg/dL (ref 6–23)
Calcium: 9.7 mg/dL (ref 8.4–10.5)
Creat: 2.06 mg/dL — ABNORMAL HIGH (ref 0.50–1.35)
GFR, Est African American: 41 mL/min — ABNORMAL LOW
GFR, Est Non African American: 36 mL/min — ABNORMAL LOW
Glucose, Bld: 139 mg/dL — ABNORMAL HIGH (ref 70–99)

## 2012-09-07 MED ORDER — ABACAVIR SULFATE 300 MG PO TABS: 600.0000 mg | ORAL_TABLET | Freq: Every day | ORAL | Status: DC

## 2012-09-07 NOTE — Progress Notes (Signed)
Subjective:    Patient ID: Nathan [name], male    DOB: 18-Dec-1960, 52 y.o.   MRN: [id number]  HPI   52 year old [place] American man with bipolar disorder mental retardation who is Dx with HIV in the 1990s. Was in care at [facility]. Pt moved to [place] in 2010 when he moved into [facility]. He was not been seeing an infectious disease specialist since then until he saw me this Spring. He had been on reyataz, norvir and truvada  Reviewed the patient's labs this recently has indicated that he has been suffering from renal insufficiency and his GFR has dropped in the 30s. THis happened with admission to hospital and an  Acute kidney injury.  We changed  him over instead to twice daily Retrovir and once daily Epivir while continuing his Reyataz and Norvir.   GFR has further improved and his HLA 5701 was negative. We will recheck labs today including viral load and CD4 count and chemistry. For now I'm 1 to change him over to a Ziagen 600 mg continue with Epivir 150 mg and wrote Norvir and Reyataz. If his GFR is improved sufficiently I'll simple fine further and combine the Epivir year and a block of year into Epzicom   Review of Systems  Constitutional: Negative for fever, chills, diaphoresis, activity change, appetite change, fatigue and unexpected weight change.  HENT: Negative for congestion, sore throat, rhinorrhea, sneezing, trouble swallowing and sinus pressure.   Eyes: Negative for photophobia and visual disturbance.  Respiratory: Negative for cough, chest tightness, shortness of breath, wheezing and stridor.   Cardiovascular: Negative for chest pain, palpitations and leg swelling.  Gastrointestinal: Negative for nausea, vomiting, abdominal pain, diarrhea, constipation, blood in stool, abdominal distention and anal bleeding.  Genitourinary: Negative for dysuria, hematuria, flank pain and difficulty urinating.  Musculoskeletal: Negative for myalgias, back pain, joint swelling, arthralgias  and gait problem.  Skin: Negative for color change, pallor, rash and wound.  Neurological: Negative for dizziness, tremors, weakness and light-headedness.  Hematological: Negative for adenopathy. Does not bruise/bleed easily.  Psychiatric/Behavioral: Negative for behavioral problems, confusion, sleep disturbance, dysphoric mood, decreased concentration and agitation.       Objective:   Physical Exam  Constitutional: He is oriented to person, place, and time. He appears well-developed and well-nourished. No distress.  HENT:  Head: Normocephalic and atraumatic.  Mouth/Throat: Oropharynx is clear and moist. No oropharyngeal exudate.  Eyes: Conjunctivae and EOM are normal. Pupils are equal, round, and reactive to light. No scleral icterus.  Neck: Normal range of motion. Neck supple. No JVD present.  Cardiovascular: Normal rate, regular rhythm and normal heart sounds.  Exam reveals no gallop and no friction rub.   No murmur heard. Pulmonary/Chest: Effort normal and breath sounds normal. No respiratory distress. He has no wheezes. He has no rales. He exhibits no tenderness.  Abdominal: He exhibits no distension and no mass. There is no tenderness. There is no rebound and no guarding.  Musculoskeletal: He exhibits no edema and no tenderness.  Lymphadenopathy:    He has no cervical adenopathy.  Neurological: He is alert and oriented to person, place, and time. He has normal reflexes. He exhibits normal muscle tone. Coordination normal.  Skin: Skin is warm and dry. He is not diaphoretic. No erythema. No pallor.  Psychiatric: He has a normal mood and affect. Judgment and thought content normal. He is slowed. Cognition and memory are impaired.          Assessment & Plan:  HIV: Change  to 600 mg of Ziagen once daily with 150 mg of Epivir once daily with Reyataz 300 mg of Norvir 100 mg. If his GFR is improved sufficiently we will simple 5 to Epzicom Reyataz and Norvir. Would like to get records  from [facility] as well.  Chronic kidney disease: Seems to be improving.  Mental retardation: appears that he is doing well in his assisted living.

## 2012-09-08 LAB — HIV-1 RNA QUANT-NO REFLEX-BLD: HIV 1 RNA Quant: <20 copies/mL (ref <20)

## 2012-09-08 LAB — T-HELPER CELL (CD4) - (RCID CLINIC ONLY): CD4 % Helper T Cell: 33 % (ref 33–55)

## 2012-10-31 ENCOUNTER — Other Ambulatory Visit: Payer: Self-pay | Admitting: Infectious Disease

## 2012-10-31 DIAGNOSIS — K219 Gastro-esophageal reflux disease without esophagitis: Secondary | ICD-10-CM

## 2012-11-11 ENCOUNTER — Other Ambulatory Visit: Payer: PRIVATE HEALTH INSURANCE

## 2012-11-11 DIAGNOSIS — F3162 Bipolar disorder, current episode mixed, moderate: Secondary | ICD-10-CM

## 2012-11-11 DIAGNOSIS — B20 Human immunodeficiency virus [HIV] disease: Secondary | ICD-10-CM

## 2012-11-11 DIAGNOSIS — E871 Hypo-osmolality and hyponatremia: Secondary | ICD-10-CM

## 2012-11-11 DIAGNOSIS — F79 Unspecified intellectual disabilities: Secondary | ICD-10-CM

## 2012-11-11 LAB — CBC WITH DIFFERENTIAL/PLATELET
Basophils Absolute: 0 10*3/uL (ref 0.0–0.1)
Eosinophils Relative: 4 % (ref 0–5)
HCT: 36.3 % — ABNORMAL LOW (ref 39.0–52.0)
Lymphocytes Relative: 23 % (ref 12–46)
MCV: 89 fL (ref 78.0–100.0)
Monocytes Absolute: 0.6 10*3/uL (ref 0.1–1.0)
RDW: 13.8 % (ref 11.5–15.5)
WBC: 7.3 10*3/uL (ref 4.0–10.5)

## 2012-11-11 LAB — COMPLETE METABOLIC PANEL WITH GFR
ALT: 20 U/L (ref 0–53)
AST: 17 U/L (ref 0–37)
Albumin: 4.4 g/dL (ref 3.5–5.2)
Alkaline Phosphatase: 72 U/L (ref 39–117)
BUN: 13 mg/dL (ref 6–23)
Potassium: 3.7 mEq/L (ref 3.5–5.3)
Sodium: 139 mEq/L (ref 135–145)
Total Protein: 7.8 g/dL (ref 6.0–8.3)

## 2012-11-12 LAB — T-HELPER CELL (CD4) - (RCID CLINIC ONLY): CD4 % Helper T Cell: 28 % — ABNORMAL LOW (ref 33–55)

## 2012-11-12 LAB — HIV-1 RNA QUANT-NO REFLEX-BLD
HIV 1 RNA Quant: <20 copies/mL (ref <20)
HIV-1 RNA Quant, Log: <1.3 {Log} (ref <1.30)

## 2012-11-18 ENCOUNTER — Telehealth: Payer: Self-pay | Admitting: Licensed Clinical Social Worker

## 2012-11-18 NOTE — Telephone Encounter (Signed)
Dr.Van Dam wanted requested a referral to a [facility], they called the patient's long term care facility and they informed them that he was already seeing a kidney specialist in [place].

## 2012-11-25 ENCOUNTER — Ambulatory Visit (INDEPENDENT_AMBULATORY_CARE_PROVIDER_SITE_OTHER): Payer: PRIVATE HEALTH INSURANCE | Admitting: Infectious Disease

## 2012-11-25 ENCOUNTER — Encounter: Payer: Self-pay | Admitting: Infectious Disease

## 2012-11-25 VITALS — BP 116/69 | HR 67 | Temp 97.7°F | Wt 197.0 lb

## 2012-11-25 DIAGNOSIS — N184 Chronic kidney disease, stage 4 (severe): Secondary | ICD-10-CM

## 2012-11-25 DIAGNOSIS — B20 Human immunodeficiency virus [HIV] disease: Secondary | ICD-10-CM

## 2012-11-25 DIAGNOSIS — F79 Unspecified intellectual disabilities: Secondary | ICD-10-CM

## 2012-11-25 DIAGNOSIS — N183 Chronic kidney disease, stage 3 unspecified: Secondary | ICD-10-CM

## 2012-11-25 NOTE — Progress Notes (Signed)
  Subjective:    Patient ID: Nathan [name], male    DOB: 1961-04-08, 52 y.o.   MRN: [id number]  HPI   52 year old [place] American man with bipolar disorder mental retardation who is Dx with HIV in the 1990s. Was in care at [facility]. Pt moved to [place] in 2010 when he moved into [facility]. He was not been seeing an infectious disease specialist since then until he saw me this Spring 2014. Marland Kitchen He had been on reyataz, norvir and truvada  He currently has been changed to renally dosed epivir, ziagen, reyataz and norvir due to his renal insufficiency. HE has not specific complaints and is in an assisted living.   Review of Systems  Constitutional: Negative for fever, chills, diaphoresis, activity change, appetite change, fatigue and unexpected weight change.  HENT: Negative for congestion, sore throat, rhinorrhea, sneezing, trouble swallowing and sinus pressure.   Eyes: Negative for photophobia and visual disturbance.  Respiratory: Negative for cough, chest tightness, shortness of breath, wheezing and stridor.   Cardiovascular: Negative for chest pain, palpitations and leg swelling.  Gastrointestinal: Negative for nausea, vomiting, abdominal pain, diarrhea, constipation, blood in stool, abdominal distention and anal bleeding.  Genitourinary: Negative for dysuria, hematuria, flank pain and difficulty urinating.  Musculoskeletal: Negative for myalgias, back pain, joint swelling, arthralgias and gait problem.  Skin: Negative for color change, pallor, rash and wound.  Neurological: Negative for dizziness, tremors, weakness and light-headedness.  Hematological: Negative for adenopathy. Does not bruise/bleed easily.  Psychiatric/Behavioral: Negative for behavioral problems, confusion, sleep disturbance, dysphoric mood, decreased concentration and agitation.       Objective:   Physical Exam  Constitutional: He is oriented to person, place, and time. He appears well-developed and well-nourished.  No distress.  HENT:  Head: Normocephalic and atraumatic.  Mouth/Throat: Oropharynx is clear and moist. No oropharyngeal exudate.  Eyes: Conjunctivae and EOM are normal. Pupils are equal, round, and reactive to light. No scleral icterus.  Neck: Normal range of motion. Neck supple. No JVD present.  Cardiovascular: Normal rate, regular rhythm and normal heart sounds.  Exam reveals no gallop and no friction rub.   No murmur heard. Pulmonary/Chest: Effort normal and breath sounds normal. No respiratory distress. He has no wheezes. He has no rales. He exhibits no tenderness.  Abdominal: He exhibits no distension and no mass. There is no tenderness. There is no rebound and no guarding.  Musculoskeletal: He exhibits no edema and no tenderness.  Lymphadenopathy:    He has no cervical adenopathy.  Neurological: He is alert and oriented to person, place, and time. He has normal reflexes. He exhibits normal muscle tone. Coordination normal.  Skin: Skin is warm and dry. He is not diaphoretic. No erythema. No pallor.  Psychiatric: He has a normal mood and affect. Judgment and thought content normal. He is slowed. Cognition and memory are impaired.          Assessment & Plan:  HIV: continue Ziagen 600mg  once daily with 150 mg of Epivir once daily with Reyataz 300 mg of Norvir 100 mg.   Would like to get records from [facility] as well.  Chronic kidney disease: Seems to be  stable  Mental retardation: appears that he is doing well in his assisted living. NOt sure what his baseline cognition was pre-HIV or if he has a component of HIV Neurocogntive disorder

## 2013-01-28 ENCOUNTER — Encounter: Payer: Self-pay | Admitting: *Deleted

## 2013-05-26 ENCOUNTER — Other Ambulatory Visit: Payer: Self-pay | Admitting: Infectious Disease

## 2013-05-26 ENCOUNTER — Other Ambulatory Visit: Payer: PRIVATE HEALTH INSURANCE

## 2013-05-26 ENCOUNTER — Telehealth: Payer: Self-pay | Admitting: *Deleted

## 2013-05-26 LAB — CBC WITH DIFFERENTIAL/PLATELET
BASOS ABS: 0 10*3/uL (ref 0.0–0.1)
Basophils Relative: 0 % (ref 0–1)
EOS PCT: 2 % (ref 0–5)
Eosinophils Absolute: 0.2 10*3/uL (ref 0.0–0.7)
HEMATOCRIT: 39.8 % (ref 39.0–52.0)
HEMOGLOBIN: 13.4 g/dL (ref 13.0–17.0)
LYMPHS PCT: 24 % (ref 12–46)
Lymphs Abs: 2.2 10*3/uL (ref 0.7–4.0)
MCH: 29.3 pg (ref 26.0–34.0)
MCHC: 33.7 g/dL (ref 30.0–36.0)
MCV: 87.1 fL (ref 78.0–100.0)
MONO ABS: 0.7 10*3/uL (ref 0.1–1.0)
MONOS PCT: 8 % (ref 3–12)
Neutro Abs: 6 10*3/uL (ref 1.7–7.7)
Neutrophils Relative %: 66 % (ref 43–77)
Platelets: 185 10*3/uL (ref 150–400)
RBC: 4.57 MIL/uL (ref 4.22–5.81)
RDW: 14.1 % (ref 11.5–15.5)
WBC: 9.1 10*3/uL (ref 4.0–10.5)

## 2013-05-26 LAB — COMPREHENSIVE METABOLIC PANEL
ALT: 41 U/L (ref 0–53)
AST: 27 U/L (ref 0–37)
Albumin: 4.7 g/dL (ref 3.5–5.2)
Alkaline Phosphatase: 65 U/L (ref 39–117)
BUN: 13 mg/dL (ref 6–23)
CO2: 27 meq/L (ref 19–32)
Calcium: 9.6 mg/dL (ref 8.4–10.5)
Chloride: 104 mEq/L (ref 96–112)
Creat: 1.68 mg/dL — ABNORMAL HIGH (ref 0.50–1.35)
Glucose, Bld: 141 mg/dL — ABNORMAL HIGH (ref 70–99)
Potassium: 3.6 mEq/L (ref 3.5–5.3)
SODIUM: 139 meq/L (ref 135–145)
TOTAL PROTEIN: 8 g/dL (ref 6.0–8.3)
Total Bilirubin: 1.2 mg/dL (ref 0.2–1.2)

## 2013-05-26 LAB — LIPID PANEL
CHOLESTEROL: 223 mg/dL — AB (ref 0–200)
HDL: 33 mg/dL — ABNORMAL LOW (ref >39)
LDL Cholesterol: 114 mg/dL — ABNORMAL HIGH (ref 0–99)
Total CHOL/HDL Ratio: 6.8 Ratio
Triglycerides: 382 mg/dL — ABNORMAL HIGH (ref <150)
VLDL: 76 mg/dL — AB (ref 0–40)

## 2013-05-26 LAB — RPR

## 2013-05-26 NOTE — Telephone Encounter (Signed)
Wrote lab test orders on paper prescription for Dr. Tommy Medal to sign.  Faxed signed orders to [facility].  [place] to let them know that pt can be taken to [facility].

## 2013-05-27 LAB — URINALYSIS, ROUTINE W REFLEX MICROSCOPIC
BILIRUBIN URINE: NEGATIVE
Glucose, UA: NEGATIVE mg/dL
HGB URINE DIPSTICK: NEGATIVE
KETONES UR: NEGATIVE mg/dL
Leukocytes, UA: NEGATIVE
Nitrite: NEGATIVE
PROTEIN: NEGATIVE mg/dL
Specific Gravity, Urine: <1.005 — ABNORMAL LOW (ref 1.005–1.030)
Urobilinogen, UA: 0.2 mg/dL (ref 0.0–1.0)
pH: 7.5 (ref 5.0–8.0)

## 2013-05-27 LAB — T-HELPER CELLS (CD4) COUNT (NOT AT ARMC)
ABSOLUTE CD4: 612 /uL (ref 381–1469)
CD4 T Helper %: 28 % — ABNORMAL LOW (ref 32–62)
Total Lymphocyte: 24 % (ref 12–46)
Total lymphocyte count: 2184 /uL (ref 700–3300)
WBC, lymph enumeration: 9.1 10*3/uL (ref 4.0–10.5)

## 2013-05-27 LAB — HIV-1 RNA QUANT-NO REFLEX-BLD
HIV 1 RNA Quant: <20 copies/mL (ref <20)
HIV-1 RNA Quant, Log: <1.3 {Log} (ref <1.30)

## 2013-05-27 LAB — MICROALBUMIN / CREATININE URINE RATIO
CREATININE, URINE: 29.3 mg/dL
MICROALB UR: 1.72 mg/dL (ref 0.00–1.89)
MICROALB/CREAT RATIO: 58.7 mg/g — AB (ref 0.0–30.0)

## 2013-05-27 LAB — PROTEIN / CREATININE RATIO, URINE
CREATININE, URINE: 29.3 mg/dL
Protein Creatinine Ratio: 0.31 — ABNORMAL HIGH (ref <0.15)
TOTAL PROTEIN, URINE: 9 mg/dL

## 2013-06-03 ENCOUNTER — Other Ambulatory Visit: Payer: Self-pay | Admitting: Infectious Disease

## 2013-06-09 ENCOUNTER — Ambulatory Visit: Payer: PRIVATE HEALTH INSURANCE | Admitting: Infectious Disease

## 2013-06-16 ENCOUNTER — Ambulatory Visit (INDEPENDENT_AMBULATORY_CARE_PROVIDER_SITE_OTHER): Payer: PRIVATE HEALTH INSURANCE | Admitting: Infectious Disease

## 2013-06-16 ENCOUNTER — Encounter: Payer: Self-pay | Admitting: Infectious Disease

## 2013-06-16 VITALS — BP 108/72 | HR 69 | Temp 98.3°F | Wt 203.0 lb

## 2013-06-16 DIAGNOSIS — B20 Human immunodeficiency virus [HIV] disease: Secondary | ICD-10-CM

## 2013-06-16 DIAGNOSIS — N183 Chronic kidney disease, stage 3 unspecified: Secondary | ICD-10-CM

## 2013-06-16 DIAGNOSIS — F0280 Dementia in other diseases classified elsewhere without behavioral disturbance: Secondary | ICD-10-CM

## 2013-06-16 NOTE — Progress Notes (Signed)
Subjective:    Patient ID: Nathan [name], male    DOB: [birth year], 53 y.o.   MRN: [id number]  HPI   Subjective:    Patient ID: Nathan [name], male    DOB: [birth year], 53 y.o.   MRN: [id number]  HPI   53year old [place] American man with bipolar disorder mental retardation who is Dx with HIV in the 1990s. Was in care at [facility]. Pt moved to [place] in 2010 when he moved into [facility]. He was not been seeing an infectious disease specialist since then until he saw me this Spring 2014. Marland Kitchen He had been on reyataz, norvir and truvada  He currently has been changed to renally dosed epivir, ziagen, reyataz and norvir due to his renal insufficiency. HE has not specific complaints and is in an assisted living.         Review of Systems  Constitutional: Negative for fever, chills, diaphoresis, activity change, appetite change, fatigue and unexpected weight change.  HENT: Negative for congestion, rhinorrhea, sinus pressure, sneezing, sore throat and trouble swallowing.   Eyes: Negative for photophobia and visual disturbance.  Respiratory: Negative for cough, chest tightness, shortness of breath, wheezing and stridor.   Cardiovascular: Negative for chest pain, palpitations and leg swelling.  Gastrointestinal: Negative for nausea, vomiting, abdominal pain, diarrhea, constipation, blood in stool, abdominal distention and anal bleeding.  Genitourinary: Negative for dysuria, hematuria, flank pain and difficulty urinating.  Musculoskeletal: Negative for arthralgias, back pain, gait problem, joint swelling and myalgias.  Skin: Negative for color change, pallor, rash and wound.  Neurological: Negative for dizziness, tremors, weakness and light-headedness.  Hematological: Negative for adenopathy. Does not bruise/bleed easily.  Psychiatric/Behavioral: Positive for confusion. Negative for behavioral problems, sleep disturbance, dysphoric mood, decreased concentration and agitation.         Objective:   Physical Exam  Constitutional: He is oriented to person, place, and time. He appears well-developed and well-nourished. No distress.  HENT:  Head: Normocephalic and atraumatic.  Mouth/Throat: Oropharynx is clear and moist. No oropharyngeal exudate.  Eyes: Conjunctivae and EOM are normal. Pupils are equal, round, and reactive to light. No scleral icterus.  Neck: Normal range of motion. Neck supple. No JVD present.  Cardiovascular: Normal rate, regular rhythm and normal heart sounds.  Exam reveals no gallop and no friction rub.   No murmur heard. Pulmonary/Chest: Effort normal and breath sounds normal. No respiratory distress. He has no wheezes. He has no rales. He exhibits no tenderness.  Abdominal: He exhibits no distension and no mass. There is no tenderness. There is no rebound and no guarding.  Musculoskeletal: He exhibits no edema and no tenderness.  Lymphadenopathy:    He has no cervical adenopathy.  Neurological: He is alert and oriented to person, place, and time. He has normal reflexes. He exhibits normal muscle tone. Coordination normal.  Skin: Skin is warm and dry. He is not diaphoretic. No erythema. No pallor.  Psychiatric: Judgment and thought content normal. His affect is blunt. His speech is delayed. He is slowed.          Assessment & Plan:   Assessment & Plan:  HIV: continue Ziagen 600mg  once daily with 150 mg of Epivir once daily with Reyataz 300 mg of Norvir 100 mg.  Use monogram GENOsURE ARchive test I spent greater than 25 minutes with the patient including greater than 50% of time in face to face counsel of the patient and in coordination of their care.  Would like to get records from [facility]  as well.  Chronic kidney disease: Seems to be  stable  Mental retardation: appears that he is doing well in his assisted living. NOt sure what his baseline cognition was pre-HIV or if he has a component of HIV Neurocogntive disorder

## 2013-12-08 ENCOUNTER — Other Ambulatory Visit: Payer: PRIVATE HEALTH INSURANCE

## 2013-12-08 DIAGNOSIS — B20 Human immunodeficiency virus [HIV] disease: Secondary | ICD-10-CM

## 2013-12-08 DIAGNOSIS — Z113 Encounter for screening for infections with a predominantly sexual mode of transmission: Secondary | ICD-10-CM

## 2013-12-08 DIAGNOSIS — Z79899 Other long term (current) drug therapy: Secondary | ICD-10-CM

## 2013-12-08 LAB — COMPLETE METABOLIC PANEL WITH GFR
ALK PHOS: 60 U/L (ref 39–117)
ALT: 26 U/L (ref 0–53)
AST: 19 U/L (ref 0–37)
Albumin: 4.3 g/dL (ref 3.5–5.2)
BUN: 16 mg/dL (ref 6–23)
CO2: 27 mEq/L (ref 19–32)
Calcium: 9.4 mg/dL (ref 8.4–10.5)
Chloride: 104 mEq/L (ref 96–112)
Creat: 1.88 mg/dL — ABNORMAL HIGH (ref 0.50–1.35)
GFR, Est African American: 45 mL/min — ABNORMAL LOW
GFR, Est Non African American: 39 mL/min — ABNORMAL LOW
Glucose, Bld: 104 mg/dL — ABNORMAL HIGH (ref 70–99)
Potassium: 3.8 mEq/L (ref 3.5–5.3)
SODIUM: 138 meq/L (ref 135–145)
Total Bilirubin: 0.7 mg/dL (ref 0.2–1.2)
Total Protein: 7.6 g/dL (ref 6.0–8.3)

## 2013-12-08 LAB — CBC WITH DIFFERENTIAL/PLATELET
BASOS ABS: 0 10*3/uL (ref 0.0–0.1)
BASOS PCT: 0 % (ref 0–1)
Eosinophils Absolute: 0.2 10*3/uL (ref 0.0–0.7)
Eosinophils Relative: 3 % (ref 0–5)
HCT: 37.7 % — ABNORMAL LOW (ref 39.0–52.0)
Hemoglobin: 12.5 g/dL — ABNORMAL LOW (ref 13.0–17.0)
LYMPHS PCT: 20 % (ref 12–46)
Lymphs Abs: 1.4 10*3/uL (ref 0.7–4.0)
MCH: 28.9 pg (ref 26.0–34.0)
MCHC: 33.2 g/dL (ref 30.0–36.0)
MCV: 87.1 fL (ref 78.0–100.0)
Monocytes Absolute: 0.6 10*3/uL (ref 0.1–1.0)
Monocytes Relative: 8 % (ref 3–12)
NEUTROS ABS: 5 10*3/uL (ref 1.7–7.7)
NEUTROS PCT: 69 % (ref 43–77)
PLATELETS: 161 10*3/uL (ref 150–400)
RBC: 4.33 MIL/uL (ref 4.22–5.81)
RDW: 14.5 % (ref 11.5–15.5)
WBC: 7.2 10*3/uL (ref 4.0–10.5)

## 2013-12-08 LAB — LIPID PANEL
CHOL/HDL RATIO: 7 ratio
CHOLESTEROL: 204 mg/dL — AB (ref 0–200)
HDL: 29 mg/dL — ABNORMAL LOW (ref >39)
LDL Cholesterol: 118 mg/dL — ABNORMAL HIGH (ref 0–99)
Triglycerides: 287 mg/dL — ABNORMAL HIGH (ref <150)
VLDL: 57 mg/dL — ABNORMAL HIGH (ref 0–40)

## 2013-12-08 LAB — RPR

## 2013-12-08 LAB — HEPATITIS C ANTIBODY: HCV AB: NEGATIVE

## 2013-12-09 LAB — T-HELPER CELL (CD4) - (RCID CLINIC ONLY)
CD4 % Helper T Cell: 31 % — ABNORMAL LOW (ref 33–55)
CD4 T Cell Abs: 460 /uL (ref 400–2700)

## 2013-12-10 LAB — HIV-1 RNA QUANT-NO REFLEX-BLD: HIV-1 RNA Quant, Log: <1.3 {Log} (ref <1.30)

## 2013-12-22 ENCOUNTER — Ambulatory Visit: Payer: PRIVATE HEALTH INSURANCE | Admitting: Infectious Disease

## 2014-01-04 ENCOUNTER — Telehealth: Payer: Self-pay

## 2014-01-04 NOTE — Telephone Encounter (Signed)
Please call kim at [facility] to schedule patient colonoscopy  [phone]

## 2014-01-11 NOTE — Telephone Encounter (Signed)
Nathan [name] called today to see if we had set him up for his TCS. I told them that we had mailed out a letter. They then said that he was in a group home.

## 2014-01-17 ENCOUNTER — Ambulatory Visit: Payer: PRIVATE HEALTH INSURANCE | Admitting: Infectious Disease

## 2014-01-27 ENCOUNTER — Ambulatory Visit: Payer: PRIVATE HEALTH INSURANCE | Admitting: Infectious Disease

## 2014-02-15 ENCOUNTER — Telehealth: Payer: Self-pay

## 2014-02-15 NOTE — Telephone Encounter (Signed)
I spoke to [place] at [facility] and got the triage info. She will fax over the med list and the insurance info and I will give her a call back with appt date and time.   She said the pt is capable of signing for for the procedure.

## 2014-02-18 ENCOUNTER — Other Ambulatory Visit: Payer: Self-pay

## 2014-02-18 DIAGNOSIS — Z1211 Encounter for screening for malignant neoplasm of colon: Secondary | ICD-10-CM

## 2014-02-18 NOTE — Telephone Encounter (Signed)
PT is scheduled an OV with Laban Emperor, NP on 03/25/2014 at 9:30 AM due to meds prior to having colonoscopy.

## 2014-02-18 NOTE — Telephone Encounter (Signed)
Taking off of the schedule for 12/14/ 2015. Pt will need an OV prior to TCS due to med list.  [facility] for [facility]oyle Sauer to cancel.

## 2014-02-18 NOTE — Telephone Encounter (Signed)
PT is scheduled for OV with Laban Emperor, NP on 03/25/2014 at 9:30 AM due to meds.  [name] at [facility] is aware.

## 2014-02-28 ENCOUNTER — Ambulatory Visit: Payer: PRIVATE HEALTH INSURANCE | Admitting: Infectious Disease

## 2014-03-01 ENCOUNTER — Encounter: Payer: Self-pay | Admitting: Infectious Disease

## 2014-03-01 ENCOUNTER — Ambulatory Visit (INDEPENDENT_AMBULATORY_CARE_PROVIDER_SITE_OTHER): Payer: PRIVATE HEALTH INSURANCE | Admitting: Infectious Disease

## 2014-03-01 VITALS — BP 117/74 | HR 75 | Temp 98.9°F | Wt 205.0 lb

## 2014-03-01 DIAGNOSIS — F0280 Dementia in other diseases classified elsewhere without behavioral disturbance: Secondary | ICD-10-CM

## 2014-03-01 DIAGNOSIS — B20 Human immunodeficiency virus [HIV] disease: Secondary | ICD-10-CM

## 2014-03-01 DIAGNOSIS — N183 Chronic kidney disease, stage 3 (moderate): Secondary | ICD-10-CM

## 2014-03-01 NOTE — Progress Notes (Signed)
   Subjective:    Patient ID: Nathan [name], male    DOB: [birth year], 53 y.o.   MRN: [id number]  HPI    Subjective:    Patient ID: Nathan [name], male    DOB: 10-Feb-1961, 53 y.o.   MRN: [id number]  HPI   53year old [place] American man with bipolar disorder mental retardation who is Dx with HIV in the 1990s. Was in care at [facility]. Pt moved to [place] in 2010 when he moved into [facility]. He was not been seeing an infectious disease specialist since then until he saw me this Spring 2014. Marland Kitchen He had been on reyataz, norvir and truvada  He currently has been changed to renally dosed epivir, ziagen, reyataz and norvir due to his renal insufficiency. HE has not specific complaints and is in an assisted living.         Review of Systems  Unable to perform ROS HENT: Negative for congestion, dental problem and trouble swallowing.   Respiratory: Negative for cough.   Cardiovascular: Negative for palpitations.  Gastrointestinal: Negative for nausea, vomiting and diarrhea.  Genitourinary: Negative for hematuria and flank pain.  Musculoskeletal: Negative for back pain and arthralgias.  Skin: Negative for color change, pallor, rash and wound.  Hematological: Does not bruise/bleed easily.  Psychiatric/Behavioral: Positive for confusion. Negative for behavioral problems, sleep disturbance, dysphoric mood, decreased concentration and agitation.       Objective:   Physical Exam  Constitutional: He is oriented to person, place, and time. He appears well-nourished. No distress.  HENT:  Head: Normocephalic and atraumatic.  Eyes: Conjunctivae and EOM are normal. No scleral icterus.  Neck: Neck supple. No thyromegaly present.  Cardiovascular: Normal rate.  Exam reveals no friction rub.   Pulmonary/Chest: No respiratory distress. He has no wheezes.  Abdominal: He exhibits no distension. There is no tenderness.  Musculoskeletal: Normal range of motion. He exhibits no edema.    Neurological: He is alert and oriented to person, place, and time.  Skin: He is not diaphoretic.  Psychiatric: Cognition and memory are impaired. He exhibits abnormal recent memory and abnormal remote memory.          Assessment & Plan:   Assessment & Plan:  HIV: continue Ziagen 600mg  once daily with 150 mg of Epivir once daily with Reyataz 300 mg of Norvir 100 mg.    I spent greater than 25 minutes with the patient including greater than 50% of time in face to face counsel of the patient and in coordination of their care.    Chronic kidney disease: Seems to be  stable  Mental retardation: appears that he is doing well in his assisted living. NOt sure what his baseline cognition was pre-HIV or if he has a component of HIV Neurocogntive disorder

## 2014-03-21 ENCOUNTER — Ambulatory Visit: Admit: 2014-03-21 | Payer: Self-pay | Admitting: Gastroenterology

## 2014-03-21 SURGERY — COLONOSCOPY
Anesthesia: Moderate Sedation

## 2014-03-25 ENCOUNTER — Ambulatory Visit (INDEPENDENT_AMBULATORY_CARE_PROVIDER_SITE_OTHER): Payer: PRIVATE HEALTH INSURANCE | Admitting: Gastroenterology

## 2014-03-25 ENCOUNTER — Other Ambulatory Visit: Payer: Self-pay

## 2014-03-25 ENCOUNTER — Encounter: Payer: Self-pay | Admitting: Gastroenterology

## 2014-03-25 VITALS — BP 133/66 | HR 80 | Temp 98.2°F | Ht 69.0 in | Wt 207.8 lb

## 2014-03-25 DIAGNOSIS — Z1211 Encounter for screening for malignant neoplasm of colon: Secondary | ICD-10-CM

## 2014-03-25 DIAGNOSIS — R1314 Dysphagia, pharyngoesophageal phase: Secondary | ICD-10-CM | POA: Insufficient documentation

## 2014-03-25 MED ORDER — PEG-KCL-NACL-NASULF-NA ASC-C 100 G PO SOLR: 1.0000 | Freq: Once | ORAL | Status: DC

## 2014-03-25 NOTE — Assessment & Plan Note (Signed)
53 year old male with need for initial screening colonoscopy; no concerning lower GI symptoms or family history of colon cancer. Mild mental retardation, but per care home, he is able to sign his own consents. Due to intermittent constipation, start Miralax once each evening as needed.   Proceed with colonoscopy with Dr. Oneida Alar in the near future. The risks, benefits, and alternatives have been discussed in detail with the patient. They state understanding and desire to proceed.  PROPOFOL due to polypharmacy

## 2014-03-25 NOTE — Progress Notes (Signed)
Primary Care Physician:  Shawn Stall, NP Primary Gastroenterologist:  Dr. Oneida Alar  Chief Complaint  Patient presents with  . Colonoscopy  . Dysphagia    HPI:   Nathan [name] is a 53 year old male who presents today at the request of Dominica Severin, NP, secondary to need for colonoscopy. He has a history of HIV, schizophrenia, mental retardation. He lives at [facility]. He does not have a healthcare power of attorney, and per Maudie Mercury at [facility], he signs his own consents. Sometimes constipation. No rectal bleeding. Notes intermittent GERD symptoms, takes Zantac. If he doesn't chew well, it "gets scary because of my teeth". Sometimes food will go down "hard". Will drink water and then vomits. No abdominal pain. No N/V.   Past Medical History  Diagnosis Date  . HIV (human immunodeficiency virus infection)   . Schizophrenia   . MR (mental retardation)   . Anemia   . Anxiety   . Bipolar 1 disorder   . Depression, major   . Lithium toxicity   . Hypernatremia   . Kidney failure   . Anemia     Past Surgical History  Procedure Laterality Date  . None      Current Outpatient Prescriptions  Medication Sig Dispense Refill  . abacavir (ZIAGEN) 300 MG tablet Take 2 tablets (600 mg total) by mouth daily. 60 tablet 11  . calcitRIOL (ROCALTROL) 0.5 MCG capsule Take 0.25 mcg by mouth daily.    . clonazePAM (KLONOPIN) 2 MG tablet Take 2 mg by mouth at bedtime.    . divalproex (DEPAKOTE) 250 MG DR tablet Take 250 mg by mouth 3 (three) times daily. Take two tablets at bedtime    . ergocalciferol (VITAMIN D2) 50000 UNITS capsule Take 50,000 Units by mouth once a week.    . ferrous sulfate (FERROUSUL) 325 (65 FE) MG tablet Take 325 mg by mouth daily.     Marland Kitchen gabapentin (NEURONTIN) 600 MG tablet Take 600 mg by mouth 3 (three) times daily.     Marland Kitchen lamiVUDine (EPIVIR) 150 MG tablet Take 1 tablet (150 mg total) by mouth daily. 30 tablet 11  . lithium carbonate (ESKALITH) 450 MG CR  tablet Take 450 mg by mouth 2 (two) times daily.    Marland Kitchen OLANZapine (ZYPREXA) 20 MG tablet Take 20 mg by mouth at bedtime.    . ranitidine (ZANTAC) 150 MG tablet TAKE 1 TABLET BY MOUTH 12 HOURS AFTER TAKING REYATAZ. 30 tablet 3  . REYATAZ 300 MG capsule TAKE (1) CAPSULE BY MOUTH ONCE DAILY WITH BREAKFAST. 30 capsule 3  . ritonavir (NORVIR) 100 MG TABS Take 100 mg by mouth daily with breakfast.    . Tamsulosin HCl (FLOMAX) 0.4 MG CAPS Take 0.4 mg by mouth daily.    . traZODone (DESYREL) 100 MG tablet Take 100 mg by mouth at bedtime.    . divalproex (DEPAKOTE ER) 500 MG 24 hr tablet Take 500 mg by mouth at bedtime.    . peg 3350 powder (MOVIPREP) 100 G SOLR Take 1 kit (200 g total) by mouth once. 1 kit 0   No current facility-administered medications for this visit.    Allergies as of 03/25/2014  . (No Known Allergies)    Family History  Problem Relation Age of Onset  . Colon cancer Neg Hx     History   Social History  . Marital Status: Single    Spouse Name: N/A    Number of Children: N/A  .  Years of Education: N/A   Occupational History  . Not on file.   Social History Main Topics  . Smoking status: Never Smoker   . Smokeless tobacco: Not on file  . Alcohol Use: No  . Drug Use: No  . Sexual Activity: No   Other Topics Concern  . Not on file   Social History Narrative    Review of Systems: Gen: Denies any fever, chills, fatigue, weight loss, lack of appetite.  CV: Denies chest pain, heart palpitations, peripheral edema, syncope.  Resp: Denies shortness of breath at rest or with exertion. Denies wheezing or cough.  GI: see HPI GU : Denies urinary burning, urinary frequency, urinary hesitancy MS: Denies joint pain, muscle weakness, cramps, or limitation of movement.  Derm: Denies rash, itching, dry skin Psych: hears voices, a boy and a girl.  Heme: Denies bruising, bleeding, and enlarged lymph nodes.  Physical Exam: BP 133/66 mmHg  Pulse 80  Temp(Src) 98.2 F  (36.8 C) (Oral)  Ht $R'5\' 9"'vD$  (1.753 m)  Wt 207 lb 12.8 oz (94.257 kg)  BMI 30.67 kg/m2 General:   Alert and oriented. Pleasant and cooperative. Noted hearing voices towards end of visit.  Head:  Normocephalic and atraumatic. Eyes:  Without icterus, sclera clear and conjunctiva pink.  Ears:  Normal auditory acuity. Nose:  No deformity, discharge,  or lesions. Mouth:  Poor dentition.  Lungs:  Clear to auscultation bilaterally. No wheezes, rales, or rhonchi. No distress.  Heart:  S1, S2 present without murmurs appreciated.  Abdomen:  +BS, soft, non-tender and non-distended. No HSM noted. No guarding or rebound. No masses appreciated.  Rectal:  Deferred  Msk:  Symmetrical without gross deformities. Shuffling gait Extremities:  Without  edema. Neurologic:  Alert and  oriented x4;  grossly normal neurologically. Skin:  Intact without significant lesions or rashes. Psych:  Alert and cooperative. Flat affect overall

## 2014-03-25 NOTE — Assessment & Plan Note (Addendum)
53 year old male with intermittent solid food dysphagia likely multifactorial in the setting of poor dentition. However, he does have occasional GERD symptoms; unable to rule out esophagitis, occult web, ring, or stricture. On Zantac currently. PPI interacts with Reyataz, affecting levels (absorption decreased at higher gastric pH). Would need to have PPI 12 hours before; will hold on PPI dosing until after EGD undertaken. Continue Zantac BID.   Proceed with upper endoscopy and dilation in the near future with Dr. Oneida Alar. The risks, benefits, and alternatives have been discussed in detail with patient. They have stated understanding and desire to proceed.  PROPOFOL due to polypharmacy.

## 2014-03-25 NOTE — Patient Instructions (Signed)
We have scheduled you for a colonoscopy, upper endoscopy, and dilation with Dr. Oneida Alar in the near future.  Continue Zantac twice a day.  I have recommended Miralax to take each evening AS NEEDED for constipation.

## 2014-04-04 ENCOUNTER — Encounter (HOSPITAL_COMMUNITY): Payer: Self-pay | Admitting: *Deleted

## 2014-04-04 ENCOUNTER — Emergency Department (HOSPITAL_COMMUNITY): Payer: PRIVATE HEALTH INSURANCE

## 2014-04-04 ENCOUNTER — Emergency Department (HOSPITAL_COMMUNITY)
Admission: EM | Admit: 2014-04-04 | Discharge: 2014-04-04 | Disposition: A | Discharge status: Home / Self Care | Payer: PRIVATE HEALTH INSURANCE | Attending: Emergency Medicine | Admitting: Emergency Medicine

## 2014-04-04 DIAGNOSIS — F419 Anxiety disorder, unspecified: Secondary | ICD-10-CM | POA: Diagnosis not present

## 2014-04-04 DIAGNOSIS — Y9289 Other specified places as the place of occurrence of the external cause: Secondary | ICD-10-CM | POA: Diagnosis not present

## 2014-04-04 DIAGNOSIS — S56911A Strain of unspecified muscles, fascia and tendons at forearm level, right arm, initial encounter: Secondary | ICD-10-CM | POA: Insufficient documentation

## 2014-04-04 DIAGNOSIS — Y998 Other external cause status: Secondary | ICD-10-CM | POA: Diagnosis not present

## 2014-04-04 DIAGNOSIS — D649 Anemia, unspecified: Secondary | ICD-10-CM | POA: Insufficient documentation

## 2014-04-04 DIAGNOSIS — X58XXXA Exposure to other specified factors, initial encounter: Secondary | ICD-10-CM | POA: Insufficient documentation

## 2014-04-04 DIAGNOSIS — Z87448 Personal history of other diseases of urinary system: Secondary | ICD-10-CM | POA: Diagnosis not present

## 2014-04-04 DIAGNOSIS — F209 Schizophrenia, unspecified: Secondary | ICD-10-CM | POA: Diagnosis not present

## 2014-04-04 DIAGNOSIS — Z79899 Other long term (current) drug therapy: Secondary | ICD-10-CM | POA: Insufficient documentation

## 2014-04-04 DIAGNOSIS — Z87828 Personal history of other (healed) physical injury and trauma: Secondary | ICD-10-CM | POA: Diagnosis not present

## 2014-04-04 DIAGNOSIS — Z21 Asymptomatic human immunodeficiency virus [HIV] infection status: Secondary | ICD-10-CM | POA: Diagnosis not present

## 2014-04-04 DIAGNOSIS — F319 Bipolar disorder, unspecified: Secondary | ICD-10-CM | POA: Diagnosis not present

## 2014-04-04 DIAGNOSIS — S59901A Unspecified injury of right elbow, initial encounter: Secondary | ICD-10-CM | POA: Diagnosis not present

## 2014-04-04 DIAGNOSIS — Y9389 Activity, other specified: Secondary | ICD-10-CM | POA: Insufficient documentation

## 2014-04-04 DIAGNOSIS — S4991XA Unspecified injury of right shoulder and upper arm, initial encounter: Secondary | ICD-10-CM | POA: Diagnosis present

## 2014-04-04 MED ORDER — IBUPROFEN 400 MG PO TABS: 600.0000 mg | ORAL_TABLET | Freq: Once | ORAL | Status: DC

## 2014-04-04 MED ORDER — ACETAMINOPHEN 500 MG PO TABS
1000.0000 mg | ORAL_TABLET | Freq: Once | ORAL | Status: AC
  Administered 2014-04-04: 1000 mg via ORAL
  Filled 2014-04-04: qty 2

## 2014-04-04 NOTE — ED Notes (Signed)
Pt arrived by EMS from a group home. Pt states he woke up this morning w/ his right forearm hurting. Denies any injury, pt is able to move arm says it hurts when he twist his arm.

## 2014-04-04 NOTE — ED Provider Notes (Signed)
CSN: [id number]     Arrival date & time 04/04/14  0227 History   First MD Initiated Contact with Patient 04/04/14 0359     Chief Complaint  Patient presents with  . Arm Injury     (Consider location/radiation/quality/duration/timing/severity/associated sxs/prior Treatment) HPI  53 year old male presents with right antecubital fossa pain since about 2 AM. States he woke up with the arm hurting. Has been having pain with moving his arm up near his shoulder and neck for the past couple weeks that is exacerbated by dancing. Denies a weakness or numbness. Denies any known injuries. Twisting his forearm makes the antecubital fossa pain worse.  Past Medical History  Diagnosis Date  . HIV (human immunodeficiency virus infection)   . Schizophrenia   . MR (mental retardation)   . Anemia   . Anxiety   . Bipolar 1 disorder   . Depression, major   . Lithium toxicity   . Hypernatremia   . Kidney failure   . Anemia    Past Surgical History  Procedure Laterality Date  . None     Family History  Problem Relation Age of Onset  . Colon cancer Neg Hx    History  Substance Use Topics  . Smoking status: Never Smoker   . Smokeless tobacco: Not on file  . Alcohol Use: No    Review of Systems    Allergies  Review of patient's allergies indicates no known allergies.  Home Medications   Prior to Admission medications   Medication Sig Start Date End Date Taking? Authorizing Provider  abacavir (ZIAGEN) 300 MG tablet Take 2 tablets (600 mg total) by mouth daily. 09/07/12   Truman Hayward, MD  calcitRIOL (ROCALTROL) 0.5 MCG capsule Take 0.25 mcg by mouth daily.    Historical Provider, MD  clonazePAM (KLONOPIN) 2 MG tablet Take 2 mg by mouth at bedtime.    Historical Provider, MD  divalproex (DEPAKOTE ER) 500 MG 24 hr tablet Take 500 mg by mouth at bedtime. 05/14/11 09/07/12  Maricela Curet, MD  divalproex (DEPAKOTE) 250 MG DR tablet Take 250 mg by mouth 3 (three) times daily. Take two  tablets at bedtime    Historical Provider, MD  ergocalciferol (VITAMIN D2) 50000 UNITS capsule Take 50,000 Units by mouth once a week.    Historical Provider, MD  ferrous sulfate (FERROUSUL) 325 (65 FE) MG tablet Take 325 mg by mouth daily.     Historical Provider, MD  gabapentin (NEURONTIN) 600 MG tablet Take 600 mg by mouth 3 (three) times daily.     Historical Provider, MD  lamiVUDine (EPIVIR) 150 MG tablet Take 1 tablet (150 mg total) by mouth daily. 07/22/12   Truman Hayward, MD  lithium carbonate (ESKALITH) 450 MG CR tablet Take 450 mg by mouth 2 (two) times daily.    Historical Provider, MD  OLANZapine (ZYPREXA) 20 MG tablet Take 20 mg by mouth at bedtime.    Historical Provider, MD  peg 3350 powder (MOVIPREP) 100 G SOLR Take 1 kit (200 g total) by mouth once. 03/25/14 04/24/14  Orvil Feil, NP  ranitidine (ZANTAC) 150 MG tablet TAKE 1 TABLET BY MOUTH 12 HOURS AFTER TAKING REYATAZ. 10/31/12   Truman Hayward, MD  REYATAZ 300 MG capsule TAKE (1) CAPSULE BY MOUTH ONCE DAILY WITH BREAKFAST.    Truman Hayward, MD  ritonavir (NORVIR) 100 MG TABS Take 100 mg by mouth daily with breakfast.    Historical Provider, MD  Tamsulosin HCl (FLOMAX) 0.4 MG CAPS Take 0.4 mg by mouth daily.    Historical Provider, MD  traZODone (DESYREL) 100 MG tablet Take 100 mg by mouth at bedtime.    Historical Provider, MD   BP 126/71 mmHg  Pulse 59  Temp(Src) 97.9 F (36.6 C) (Oral)  Resp 18  Ht _0  (1.753 m)  Wt 203 lb (92.08 kg)  BMI 29.96 kg/m2  SpO2 99% Physical Exam  Constitutional: He is oriented to person, place, and time. He appears well-developed and well-nourished. No distress.  HENT:  Head: Normocephalic and atraumatic.  Right Ear: External ear normal.  Left Ear: External ear normal.  Nose: Nose normal.  Eyes: Right eye exhibits no discharge. Left eye exhibits no discharge.  Neck: Neck supple.    Cardiovascular: Normal rate, regular rhythm, normal heart sounds and intact distal  pulses.   Pulses:      Radial pulses are 2+ on the right side, and 2+ on the left side.  2 +ulnar pulses  Pulmonary/Chest: Effort normal.  Musculoskeletal: He exhibits no edema.       Right shoulder: He exhibits decreased range of motion (mild). He exhibits no tenderness.       Right elbow: He exhibits normal range of motion and no swelling. Tenderness (anterior/lateral) found.  Normal strength and sensation of RUE.   Neurological: He is alert and oriented to person, place, and time.  Skin: Skin is warm and dry.  Nursing note and vitals reviewed.   ED Course  Procedures (including critical care time) Labs Review Labs Reviewed - No data to display  Imaging Review Dg Shoulder Right  04/04/2014   CLINICAL DATA:  Generalized right shoulder pain.  No known injury.  EXAM: RIGHT SHOULDER - 2+ VIEW  COMPARISON:  None.  FINDINGS: Glenohumeral osteoarthritis with joint narrowing and marginal spurring. There is a chronic ossified body in the subcoracoid region measuring 11 mm. On scapular Y and axillary views there is amorphous mineralization in the region of the anterior rotator cuff compatible with calcific tendinitis.  IMPRESSION: 1. 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 2. Glenohumeral osteoarthritis with subcoracoid ossified body.   Electronically Signed   By: Jorje Guild M.D.   On: 04/04/2014 05:31   Dg Elbow Complete Right  04/04/2014   CLINICAL DATA:  Generalized right shoulder pain. Arm injury. Initial encounter.  EXAM: RIGHT ELBOW - COMPLETE 3+ VIEW  COMPARISON:  None.  FINDINGS: There is no evidence of fracture, dislocation, or joint effusion. Negative soft tissues.  IMPRESSION: Negative.   Electronically Signed   By: Jorje Guild M.D.   On: 04/04/2014 05:29     EKG Interpretation None      MDM   Final diagnoses:  Forearm strain, right, initial encounter    Patient with a right forearm muscle pain is likely from a strain. He does have some decreased range of  motion of shoulder but no specific injury. This is likely from the osteoarthritis seen in the shoulder x-ray. He is neurovascularly intact and has normal pulses. Given Tylenol with good relief. Will discharge with referral to orthopedics.    Ephraim Hamburger, MD 04/04/14 [phone]

## 2014-04-04 NOTE — ED Notes (Signed)
Report called to Reggie @ [place] from [facility] will be here to pick him up at 0700.

## 2014-04-04 NOTE — ED Notes (Signed)
Patient given two grape juices at this time.

## 2014-04-06 NOTE — Patient Instructions (Signed)
Nathan GREWELL  04/06/2014   Your procedure is scheduled on:  04/12/2014  Report to [facility] at 7:15 AM.  Call this number if you have problems the morning of surgery: [phone]   Remember:   Do not eat food or drink liquids after midnight.   Take these medicines the morning of surgery with A SIP OF WATER: Ziagen, Epivir, Norvir, Reyataz, Klonopin, Depakote, Gabapentin, Lithium, Zyprexa, Zantac, Flomax   Do not wear jewelry, make-up or nail polish.  Do not wear lotions, powders, or perfumes. You may wear deodorant.  Do not shave 48 hours prior to surgery. Men may shave face and neck.  Do not bring valuables to the hospital.  [facility] is not responsible for any belongings or valuables.               Contacts, dentures or bridgework may not be worn into surgery.  Leave suitcase in the car. After surgery it may be brought to your room.  For patients admitted to the hospital, discharge time is determined by your treatment team.               Patients discharged the day of surgery will not be allowed to drive home.  Name and phone number of your driver:   Special Instructions: N/A   Please read over the following fact sheets that you were given: Anesthesia Fact Sheet   PATIENT INSTRUCTIONS POST-ANESTHESIA  IMMEDIATELY FOLLOWING SURGERY:  Do not drive or operate machinery for the first twenty four hours after surgery.  Do not make any important decisions for twenty four hours after surgery or while taking narcotic pain medications or sedatives.  If you develop intractable nausea and vomiting or a severe headache please notify your doctor immediately.  FOLLOW-UP:  Please make an appointment with your surgeon as instructed. You do not need to follow up with anesthesia unless specifically instructed to do so.  WOUND CARE INSTRUCTIONS (if applicable):  Keep a dry clean dressing on the anesthesia/puncture wound site if there is drainage.  Once the wound has quit draining you may  leave it open to air.  Generally you should leave the bandage intact for twenty four hours unless there is drainage.  If the epidural site drains for more than 36-48 hours please call the anesthesia department.  QUESTIONS?:  Please feel free to call your physician or the hospital operator if you have any questions, and they will be happy to assist you.      Esophageal Dilatation The esophagus is the long, narrow tube which carries food and liquid from the mouth to the stomach. Esophageal dilatation is the technique used to stretch a blocked or narrowed portion of the esophagus. This procedure is used when a part of the esophagus has become so narrow that it becomes difficult, painful or even impossible to swallow. This is generally an uncomplicated form of treatment. When this is not successful, chest surgery may be required. This is a much more extensive form of treatment with a longer recovery time. CAUSES  Some of the more common causes of blockage or strictures of the esophagus are:  Narrowing from longstanding inflammation (soreness and redness) of the lower esophagus. This comes from the constant exposure of the lower esophagus to the acid which bubbles up from the stomach. Over time this causes scarring and narrowing of the lower esophagus.  Hiatal hernia in which a small part of the stomach bulges (herniates) up through the diaphragm. This can cause a  gradual narrowing of the end of the esophagus.  Schatzki ring is a narrow ring of benign (non-cancerous) fibrous tissue which constricts the lower esophagus. The reason for this is not known.  Scleroderma is a connective tissue disorder that affects the esophagus and makes swallowing difficult.  Achalasia is an absence of nerves to the lower esophagus and to the esophageal sphincter. This is the circular muscle between the stomach and esophagus that relaxes to allow food into the stomach. After swallowing, it contracts to keep food in the  stomach. This absence of nerves may be congenital (present since birth). This can cause irregular spasms of the lower esophageal muscle. This spasm does not open up to allow food and fluid through. The result is a persistent blockage with subsequent slow trickling of the esophageal contents into the stomach.  Strictures may develop from swallowing materials which damage the esophagus. Some examples are strong acids or alkalis such as lye.  Growths such as benign (non-cancerous) and malignant (cancerous) tumors can block the esophagus.  Hereditary (present since birth) causes. DIAGNOSIS  Your caregiver often suspects this problem by taking a medical history. They will also do a physical exam. They can then prove their suspicions using X-rays and endoscopy. Endoscopy is an exam in which a tube like a small, flexible telescope is used to look at your esophagus.  TREATMENT There are different stretching (dilating) techniques that can be used. Simple bougie dilatation may be done in the office. This usually takes only a couple minutes. A numbing (anesthetic) spray of the throat is used. Endoscopy, when done, is done in an endoscopy suite under mild sedation. When fluoroscopy is used, the procedure is performed in X-ray. Other techniques require a little longer time. Recovery is usually quick. There is no waiting time to begin eating and drinking to test success of the treatment. Following are some of the methods used. Narrowing of the esophagus is treated by making it bigger. Commonly this is a mechanical problem which can be treated with stretching. This can be done in different ways. Your caregiver will discuss these with you. Some of the means used are:  A series of graduated (increasing thickness) flexible dilators can be used. These are weighted tubes passed through the esophagus into the stomach. The tubes used become progressively larger until the desired stretched size is reached. Graduated dilators  are a simple and quick way of opening the esophagus. No visualization is required.  Another method is the use of endoscopy to place a flexible wire across the stricture. The endoscope is removed and the wire left in place. A dilator with a hole through it from end to end is guided down the esophagus and across the stricture. One or more of these dilators are passed over the wire. At the end of the exam, the wire is removed. This type of treatment may be performed in the X-ray department under fluoroscopy. An advantage of this procedure is the examiner is visualizing the end opening in the esophagus.  Stretching of the esophagus may be done using balloons. Deflated balloons are placed through the endoscope and across the stricture. This type of balloon dilatation is often done at the time of endoscopy or fluoroscopy. Flexible endoscopy allows the examiner to directly view the stricture. A balloon is inserted in the deflated form into the area of narrowing. It is then inflated with air to a certain pressure that is preset for a given circumference. When inflated, it becomes sausage shaped, stretched, and  makes the stricture larger.  Achalasia requires a longer, larger balloon-type dilator. This is frequently done under X-ray control. In this situation, the spastic muscle fibers in the lower esophagus are stretched. All of the above procedures make the passage of food and water into the stomach easier. They also make it easier for stomach contents to reflux back into the esophagus. Special medications may be used following the procedure to help prevent further stricturing. Proton-pump inhibitor medications are good at decreasing the amount of acid in the stomach juice. When stomach juice refluxes into the esophagus, the juice is no longer as acidic and is less likely to burn or scar the esophagus. RISKS AND COMPLICATIONS Esophageal dilatation is usually performed effectively and without problems. Some  complications that can occur are:  A small amount of bleeding almost always happens where the stretching takes place. If this is too excessive it may require more aggressive treatment.  An uncommon complication is perforation (making a hole) of the esophagus. The esophagus is thin. It is easy to make a hole in it. If this happens, an operation may be necessary to repair this.  A small, undetected perforation could lead to an infection in the chest. This can be very serious. HOME CARE INSTRUCTIONS   If you received sedation for your procedure, do not drive, make important decisions, or perform any activities requiring your full coordination. Do not drink alcohol, take sedatives, or use any mind altering chemicals unless instructed by your caregiver.  You may use throat lozenges or warm salt water gargles if you have throat discomfort.  You can begin eating and drinking normally on return home unless instructed otherwise. Do not purposely try to force large chunks of food down to test the benefits of your procedure.  Mild discomfort can be eased with sips of ice water.  Medications for discomfort may or may not be needed. SEEK IMMEDIATE MEDICAL CARE IF:   You begin vomiting up blood.  You develop black, tarry stools.  You develop chills or an unexplained temperature of over 101F (38.3C)  You develop chest or abdominal pain.  You develop shortness of breath, or feel light-headed or faint.  Your swallowing is becoming more painful, difficult, or you are unable to swallow. MAKE SURE YOU:   Understand these instructions.  Will watch your condition.  Will get help right away if you are not doing well or get worse. Document Released: 05/16/2005 Document Revised: 08/09/2013 Document Reviewed: 07/03/2005 [facility] Patient Information 2015 [place], [place]. This information is not intended to replace advice given to you by your health care provider. Make sure you discuss any questions you  have with your health care provider. Esophagogastroduodenoscopy Esophagogastroduodenoscopy (EGD) is a procedure to examine the lining of the esophagus, stomach, and first part of the small intestine (duodenum). A long, flexible, lighted tube with a camera attached (endoscope) is inserted down the throat to view these organs. This procedure is done to detect problems or abnormalities, such as inflammation, bleeding, ulcers, or growths, in order to treat them. The procedure lasts about 5-20 minutes. It is usually an outpatient procedure, but it may need to be performed in emergency cases in the hospital. LET YOUR CAREGIVER KNOW ABOUT:   Allergies to food or medicine.  All medicines you are taking, including vitamins, herbs, eyedrops, and over-the-counter medicines and creams.  Use of steroids (by mouth or creams).  Previous problems you or members of your family have had with the use of anesthetics.  Any blood  disorders you have.  Previous surgeries you have had.  Other health problems you have.  Possibility of pregnancy, if this applies. RISKS AND COMPLICATIONS  Generally, EGD is a safe procedure. However, as with any procedure, complications can occur. Possible complications include:  Infection.  Bleeding.  Tearing (perforation) of the esophagus, stomach, or duodenum.  Difficulty breathing or not being able to breath.  Excessive sweating.  Spasms of the larynx.  Slowed heartbeat.  Low blood pressure. BEFORE THE PROCEDURE  Do not eat or drink anything for 6-8 hours before the procedure or as directed by your caregiver.  Ask your caregiver about changing or stopping your regular medicines.  If you wear dentures, be prepared to remove them before the procedure.  Arrange for someone to drive you home after the procedure. PROCEDURE   A vein will be accessed to give medicines and fluids. A medicine to relax you (sedative) and a pain reliever will be given through that  access into the vein.  A numbing medicine (local anesthetic) may be sprayed on your throat for comfort and to stop you from gagging or coughing.  A mouth guard may be placed in your mouth to protect your teeth and to keep you from biting on the endoscope.  You will be asked to lie on your left side.  The endoscope is inserted down your throat and into the esophagus, stomach, and duodenum.  Air is put through the endoscope to allow your caregiver to view the lining of your esophagus clearly.  The esophagus, stomach, and duodenum is then examined. During the exam, your caregiver may:  Remove tissue to be examined under a microscope (biopsy) for inflammation, infection, or other medical problems.  Remove growths.  Remove objects (foreign bodies) that are stuck.  Treat any bleeding with medicines or other devices that stop tissues from bleeding (hot cautery, clipping devices).  Widen (dilate) or stretch narrowed areas of the esophagus and stomach.  The endoscope will then be withdrawn. AFTER THE PROCEDURE  You will be taken to a recovery area to be monitored. You will be able to go home once you are stable and alert.  Do not eat or drink anything until the local anesthetic and numbing medicines have worn off. You may choke.  It is normal to feel bloated, have pain with swallowing, or have a sore throat for a short time. This will wear off.  Your caregiver should be able to discuss his or her findings with you. It will take longer to discuss the test results if any biopsies were taken. Document Released: 07/26/2004 Document Revised: 08/09/2013 Document Reviewed: 02/26/2012 [facility] Patient Information 2015 [place], [place]. This information is not intended to replace advice given to you by your health care provider. Make sure you discuss any questions you have with your health care provider. Colonoscopy A colonoscopy is an exam to look at the entire large intestine (colon). This exam  can help find problems such as tumors, polyps, inflammation, and areas of bleeding. The exam takes about 1 hour.  LET [facility] CARE PROVIDER KNOW ABOUT:   Any allergies you have.  All medicines you are taking, including vitamins, herbs, eye drops, creams, and over-the-counter medicines.  Previous problems you or members of your family have had with the use of anesthetics.  Any blood disorders you have.  Previous surgeries you have had.  Medical conditions you have. RISKS AND COMPLICATIONS  Generally, this is a safe procedure. However, as with any procedure, complications can occur.  Possible complications include:  Bleeding.  Tearing or rupture of the colon wall.  Reaction to medicines given during the exam.  Infection (rare). BEFORE THE PROCEDURE   Ask your health care provider about changing or stopping your regular medicines.  You may be prescribed an oral bowel prep. This involves drinking a large amount of medicated liquid, starting the day before your procedure. The liquid will cause you to have multiple loose stools until your stool is almost clear or light green. This cleans out your colon in preparation for the procedure.  Do not eat or drink anything else once you have started the bowel prep, unless your health care provider tells you it is safe to do so.  Arrange for someone to drive you home after the procedure. PROCEDURE   You will be given medicine to help you relax (sedative).  You will lie on your side with your knees bent.  A long, flexible tube with a light and camera on the end (colonoscope) will be inserted through the rectum and into the colon. The camera sends video back to a computer screen as it moves through the colon. The colonoscope also releases carbon dioxide gas to inflate the colon. This helps your health care provider see the area better.  During the exam, your health care provider may take a small tissue sample (biopsy) to be examined under  a microscope if any abnormalities are found.  The exam is finished when the entire colon has been viewed. AFTER THE PROCEDURE   Do not drive for 24 hours after the exam.  You may have a small amount of blood in your stool.  You may pass moderate amounts of gas and have mild abdominal cramping or bloating. This is caused by the gas used to inflate your colon during the exam.  Ask when your test results will be ready and how you will get your results. Make sure you get your test results. Document Released: 03/22/2000 Document Revised: 01/13/2013 Document Reviewed: 11/30/2012 [facility] Patient Information 2015 [place], [place]. This information is not intended to replace advice given to you by your health care provider. Make sure you discuss any questions you have with your health care provider.

## 2014-04-07 ENCOUNTER — Encounter (HOSPITAL_COMMUNITY): Payer: Self-pay

## 2014-04-07 ENCOUNTER — Encounter (HOSPITAL_COMMUNITY)
Admission: RE | Admit: 2014-04-07 | Discharge: 2014-04-07 | Disposition: A | Discharge status: Home / Self Care | Payer: PRIVATE HEALTH INSURANCE | Source: Ambulatory Visit | Attending: Gastroenterology | Admitting: Gastroenterology

## 2014-04-07 DIAGNOSIS — Z01812 Encounter for preprocedural laboratory examination: Secondary | ICD-10-CM | POA: Insufficient documentation

## 2014-04-07 LAB — BASIC METABOLIC PANEL
Anion gap: 7 (ref 5–15)
BUN: 16 mg/dL (ref 6–23)
CALCIUM: 9.5 mg/dL (ref 8.4–10.5)
CO2: 26 mmol/L (ref 19–32)
Chloride: 104 mEq/L (ref 96–112)
Creatinine, Ser: 1.78 mg/dL — ABNORMAL HIGH (ref 0.50–1.35)
GFR calc Af Amer: 48 mL/min — ABNORMAL LOW (ref >90)
GFR calc non Af Amer: 41 mL/min — ABNORMAL LOW (ref >90)
GLUCOSE: 216 mg/dL — AB (ref 70–99)
Potassium: 3.7 mmol/L (ref 3.5–5.1)
SODIUM: 137 mmol/L (ref 135–145)

## 2014-04-07 LAB — HEMOGLOBIN AND HEMATOCRIT, BLOOD
HCT: 37.9 % — ABNORMAL LOW (ref 39.0–52.0)
Hemoglobin: 12 g/dL — ABNORMAL LOW (ref 13.0–17.0)

## 2014-04-07 NOTE — Progress Notes (Signed)
cc'ed to pcp °

## 2014-04-07 NOTE — Progress Notes (Signed)
REVIEWED-NO ADDITIONAL RECOMMENDATIONS. 

## 2014-04-07 NOTE — Progress Notes (Signed)
Pt came in for pre-op interview today 04/07/14. Information regarding surgery given to pt and caregiver from [place]. Pre-op packet given to caregiver to take back to nursing staff at the [facility]. Called and spoke with Erlinda Hong, med tech concerning what medications to take morning of surgery. She also said that she has received instructions concerning pt's bowel prep from Dr. Nona Dell office.

## 2014-04-12 ENCOUNTER — Ambulatory Visit (HOSPITAL_COMMUNITY)
Admission: RE | Admit: 2014-04-12 | Discharge: 2014-04-12 | Disposition: A | Discharge status: Nursing Home (Includes ICF) | Payer: Medicare Other | Source: Ambulatory Visit | Attending: Gastroenterology | Admitting: Gastroenterology

## 2014-04-12 ENCOUNTER — Encounter (HOSPITAL_COMMUNITY): Payer: Self-pay | Admitting: *Deleted

## 2014-04-12 ENCOUNTER — Encounter (HOSPITAL_COMMUNITY)
Admission: RE | Disposition: A | Discharge status: Nursing Home (Includes ICF) | Payer: Self-pay | Source: Ambulatory Visit | Attending: Gastroenterology

## 2014-04-12 ENCOUNTER — Ambulatory Visit (HOSPITAL_COMMUNITY): Payer: Medicare Other | Admitting: Anesthesiology

## 2014-04-12 DIAGNOSIS — Z79899 Other long term (current) drug therapy: Secondary | ICD-10-CM | POA: Diagnosis not present

## 2014-04-12 DIAGNOSIS — N19 Unspecified kidney failure: Secondary | ICD-10-CM | POA: Insufficient documentation

## 2014-04-12 DIAGNOSIS — D120 Benign neoplasm of cecum: Secondary | ICD-10-CM | POA: Insufficient documentation

## 2014-04-12 DIAGNOSIS — Z808 Family history of malignant neoplasm of other organs or systems: Secondary | ICD-10-CM | POA: Insufficient documentation

## 2014-04-12 DIAGNOSIS — F319 Bipolar disorder, unspecified: Secondary | ICD-10-CM | POA: Insufficient documentation

## 2014-04-12 DIAGNOSIS — I440 Atrioventricular block, first degree: Secondary | ICD-10-CM | POA: Insufficient documentation

## 2014-04-12 DIAGNOSIS — F209 Schizophrenia, unspecified: Secondary | ICD-10-CM | POA: Insufficient documentation

## 2014-04-12 DIAGNOSIS — E119 Type 2 diabetes mellitus without complications: Secondary | ICD-10-CM | POA: Insufficient documentation

## 2014-04-12 DIAGNOSIS — K317 Polyp of stomach and duodenum: Secondary | ICD-10-CM | POA: Diagnosis not present

## 2014-04-12 DIAGNOSIS — K648 Other hemorrhoids: Secondary | ICD-10-CM | POA: Insufficient documentation

## 2014-04-12 DIAGNOSIS — R1310 Dysphagia, unspecified: Secondary | ICD-10-CM

## 2014-04-12 DIAGNOSIS — K635 Polyp of colon: Secondary | ICD-10-CM | POA: Diagnosis not present

## 2014-04-12 DIAGNOSIS — K222 Esophageal obstruction: Secondary | ICD-10-CM | POA: Insufficient documentation

## 2014-04-12 DIAGNOSIS — Z1211 Encounter for screening for malignant neoplasm of colon: Secondary | ICD-10-CM | POA: Diagnosis present

## 2014-04-12 DIAGNOSIS — Z862 Personal history of diseases of the blood and blood-forming organs and certain disorders involving the immune mechanism: Secondary | ICD-10-CM | POA: Diagnosis not present

## 2014-04-12 DIAGNOSIS — I517 Cardiomegaly: Secondary | ICD-10-CM | POA: Insufficient documentation

## 2014-04-12 DIAGNOSIS — K2950 Unspecified chronic gastritis without bleeding: Secondary | ICD-10-CM | POA: Insufficient documentation

## 2014-04-12 DIAGNOSIS — B20 Human immunodeficiency virus [HIV] disease: Secondary | ICD-10-CM | POA: Insufficient documentation

## 2014-04-12 HISTORY — PX: ESOPHAGOGASTRODUODENOSCOPY (EGD) WITH PROPOFOL: SHX5813

## 2014-04-12 HISTORY — PX: BIOPSY: SHX5522

## 2014-04-12 HISTORY — PX: SAVORY DILATION: SHX5439

## 2014-04-12 HISTORY — PX: COLONOSCOPY WITH PROPOFOL: SHX5780

## 2014-04-12 HISTORY — PX: POLYPECTOMY: SHX5525

## 2014-04-12 LAB — GLUCOSE, CAPILLARY
GLUCOSE-CAPILLARY: 127 mg/dL — AB (ref 70–99)
Glucose-Capillary: 103 mg/dL — ABNORMAL HIGH (ref 70–99)

## 2014-04-12 SURGERY — COLONOSCOPY WITH PROPOFOL
Anesthesia: Monitor Anesthesia Care

## 2014-04-12 MED ORDER — LIDOCAINE VISCOUS 2 % MT SOLN
3.0000 mL | Freq: Once | OROMUCOSAL | Status: AC
  Administered 2014-04-12: 3 mL via OROMUCOSAL

## 2014-04-12 MED ORDER — PROPOFOL 10 MG/ML IV BOLUS
INTRAVENOUS | Status: AC
  Filled 2014-04-12: qty 20

## 2014-04-12 MED ORDER — ONDANSETRON HCL 4 MG/2ML IJ SOLN
INTRAMUSCULAR | Status: AC
  Filled 2014-04-12: qty 2

## 2014-04-12 MED ORDER — GLYCOPYRROLATE 0.2 MG/ML IJ SOLN
INTRAMUSCULAR | Status: AC
  Filled 2014-04-12: qty 1

## 2014-04-12 MED ORDER — MIDAZOLAM HCL 2 MG/2ML IJ SOLN
1.0000 mg | INTRAMUSCULAR | Status: DC | PRN
  Administered 2014-04-12: 2 mg via INTRAVENOUS

## 2014-04-12 MED ORDER — LIDOCAINE VISCOUS 2 % MT SOLN
3.0000 mL | Freq: Once | OROMUCOSAL | Status: AC
  Administered 2014-04-12: 3 mL via OROMUCOSAL
  Filled 2014-04-12: qty 15

## 2014-04-12 MED ORDER — ONDANSETRON HCL 4 MG/2ML IJ SOLN: 4.0000 mg | Freq: Once | INTRAMUSCULAR | Status: DC | PRN

## 2014-04-12 MED ORDER — GLYCOPYRROLATE 0.2 MG/ML IJ SOLN
0.2000 mg | Freq: Once | INTRAMUSCULAR | Status: AC
  Administered 2014-04-12: 0.2 mg via INTRAVENOUS

## 2014-04-12 MED ORDER — FENTANYL CITRATE 0.05 MG/ML IJ SOLN: 25.0000 ug | INTRAMUSCULAR | Status: DC | PRN

## 2014-04-12 MED ORDER — PROPOFOL INFUSION 10 MG/ML OPTIME
INTRAVENOUS | Status: DC | PRN
  Administered 2014-04-12: 09:00:00 via INTRAVENOUS
  Administered 2014-04-12: 100 ug/kg/min via INTRAVENOUS
  Administered 2014-04-12: 10:00:00 via INTRAVENOUS

## 2014-04-12 MED ORDER — STERILE WATER FOR IRRIGATION IR SOLN
Status: DC | PRN
  Administered 2014-04-12: 1000 mL

## 2014-04-12 MED ORDER — FENTANYL CITRATE 0.05 MG/ML IJ SOLN
25.0000 ug | INTRAMUSCULAR | Status: AC
  Administered 2014-04-12 (×2): 25 ug via INTRAVENOUS

## 2014-04-12 MED ORDER — LIDOCAINE HCL 2 % IJ SOLN: 0.1000 mL | Freq: Once | INTRAMUSCULAR | Status: DC

## 2014-04-12 MED ORDER — LACTATED RINGERS IV SOLN
INTRAVENOUS | Status: DC
Start: 2014-04-12 — End: 2014-04-12
  Administered 2014-04-12: 08:00:00 via INTRAVENOUS

## 2014-04-12 MED ORDER — MINERAL OIL PO OIL
TOPICAL_OIL | ORAL | Status: AC
  Filled 2014-04-12: qty 60

## 2014-04-12 MED ORDER — MIDAZOLAM HCL 2 MG/2ML IJ SOLN
INTRAMUSCULAR | Status: AC
  Filled 2014-04-12: qty 2

## 2014-04-12 MED ORDER — ONDANSETRON HCL 4 MG/2ML IJ SOLN
4.0000 mg | Freq: Once | INTRAMUSCULAR | Status: AC
  Administered 2014-04-12: 4 mg via INTRAVENOUS

## 2014-04-12 MED ORDER — FENTANYL CITRATE 0.05 MG/ML IJ SOLN
INTRAMUSCULAR | Status: AC
  Filled 2014-04-12: qty 2

## 2014-04-12 MED ORDER — PROPOFOL 10 MG/ML IV BOLUS
INTRAVENOUS | Status: DC | PRN
  Administered 2014-04-12 (×2): 10 mg via INTRAVENOUS

## 2014-04-12 MED ORDER — LIDOCAINE HCL 2 % EX GEL
CUTANEOUS | Status: DC | PRN
  Administered 2014-04-12: 1

## 2014-04-12 MED ORDER — WATER FOR IRRIGATION, STERILE IR SOLN
Status: DC | PRN
  Administered 2014-04-12: 1000 mL

## 2014-04-12 SURGICAL SUPPLY — 27 items
BLOCK BITE 60FR ADLT L/F BLUE (MISCELLANEOUS) ×3 IMPLANT
ELECT REM PT RETURN 9FT ADLT (ELECTROSURGICAL)
ELECTRODE REM PT RTRN 9FT ADLT (ELECTROSURGICAL) IMPLANT
FCP BXJMBJMB 240X2.8X (CUTTING FORCEPS)
FLOOR PAD 36X40 (MISCELLANEOUS) ×3
FORCEP RJ3 GP 1.8X160 W-NEEDLE (CUTTING FORCEPS) IMPLANT
FORCEPS BIOP RAD 4 LRG CAP 4 (CUTTING FORCEPS) ×6 IMPLANT
FORCEPS BIOP RJ4 240 W/NDL (CUTTING FORCEPS)
FORCEPS BXJMBJMB 240X2.8X (CUTTING FORCEPS) IMPLANT
FORMALIN 10 PREFIL 20ML (MISCELLANEOUS) ×3 IMPLANT
INJECTOR/SNARE I SNARE (MISCELLANEOUS) IMPLANT
KIT CLEAN ENDO COMPLIANCE (KITS) ×3 IMPLANT
LUBRICANT JELLY 4.5OZ STERILE (MISCELLANEOUS) ×3 IMPLANT
MANIFOLD NEPTUNE II (INSTRUMENTS) ×3 IMPLANT
NEEDLE SCLEROTHERAPY 25GX240 (NEEDLE) IMPLANT
PAD FLOOR 36X40 (MISCELLANEOUS) ×1 IMPLANT
PROBE APC STR FIRE (PROBE) IMPLANT
PROBE INJECTION GOLD (MISCELLANEOUS)
PROBE INJECTION GOLD 7FR (MISCELLANEOUS) IMPLANT
SNARE ROTATE MED OVAL 20MM (MISCELLANEOUS) IMPLANT
SNARE SHORT THROW 13M SML OVAL (MISCELLANEOUS) IMPLANT
SYR 50ML LL SCALE MARK (SYRINGE) ×3 IMPLANT
SYR INFLATION 60ML (SYRINGE) ×3 IMPLANT
TRAP SPECIMEN MUCOUS 40CC (MISCELLANEOUS) ×3 IMPLANT
TUBING ENDO SMARTCAP PENTAX (MISCELLANEOUS) ×3 IMPLANT
TUBING IRRIGATION ENDOGATOR (MISCELLANEOUS) ×3 IMPLANT
WATER STERILE IRR 1000ML POUR (IV SOLUTION) ×3 IMPLANT

## 2014-04-12 NOTE — H&P (Signed)
Primary Care Physician:  Shawn Stall, NP Primary Gastroenterologist:  Dr. Oneida Alar  Pre-Procedure History & Physical: HPI:  Nathan [name] is a 54 y.o. male here for COLON CANCER SCREENING/dysphagia.  Past Medical History  Diagnosis Date  . HIV (human immunodeficiency virus infection)   . Schizophrenia   . MR (mental retardation)   . Anemia   . Anxiety   . Bipolar 1 disorder   . Depression, major   . Lithium toxicity   . Hypernatremia   . Kidney failure   . Anemia     Past Surgical History  Procedure Laterality Date  . Nose surgery  1968    unsure what type of surgery. something to help him breathe better    Prior to Admission medications   Medication Sig Start Date End Date Taking? Authorizing Provider  abacavir (ZIAGEN) 300 MG tablet Take 2 tablets (600 mg total) by mouth daily. 09/07/12  Yes Truman Hayward, MD  calcitRIOL (ROCALTROL) 0.25 MCG capsule Take 0.25 mcg by mouth daily.   Yes Historical Provider, MD  clonazePAM (KLONOPIN) 2 MG tablet Take 2 mg by mouth at bedtime.   Yes Historical Provider, MD  divalproex (DEPAKOTE) 250 MG DR tablet Take 500 mg by mouth at bedtime.    Yes Historical Provider, MD  ergocalciferol (VITAMIN D2) 50000 UNITS capsule Take 50,000 Units by mouth once a week.   Yes Historical Provider, MD  ferrous sulfate (FERROUSUL) 325 (65 FE) MG tablet Take 325 mg by mouth daily.    Yes Historical Provider, MD  gabapentin (NEURONTIN) 600 MG tablet Take 600 mg by mouth 3 (three) times daily.    Yes Historical Provider, MD  lamiVUDine (EPIVIR) 150 MG tablet Take 1 tablet (150 mg total) by mouth daily. 07/22/12  Yes Truman Hayward, MD  lithium carbonate (ESKALITH) 450 MG CR tablet Take 450 mg by mouth 2 (two) times daily.   Yes Historical Provider, MD  OLANZapine (ZYPREXA) 20 MG tablet Take 20 mg by mouth at bedtime.   Yes Historical Provider, MD  peg 3350 powder (MOVIPREP) 100 G SOLR Take 1 kit (200 g total) by mouth once. 03/25/14 04/24/14  Yes Orvil Feil, NP  ranitidine (ZANTAC) 150 MG tablet TAKE 1 TABLET BY MOUTH 12 HOURS AFTER TAKING REYATAZ. 10/31/12  Yes Truman Hayward, MD  REYATAZ 300 MG capsule TAKE (1) CAPSULE BY MOUTH ONCE DAILY WITH BREAKFAST.   Yes Truman Hayward, MD  ritonavir (NORVIR) 100 MG TABS Take 100 mg by mouth daily with breakfast.   Yes Historical Provider, MD  Tamsulosin HCl (FLOMAX) 0.4 MG CAPS Take 0.4 mg by mouth daily.   Yes Historical Provider, MD  traZODone (DESYREL) 100 MG tablet Take 100 mg by mouth at bedtime.   Yes Historical Provider, MD  divalproex (DEPAKOTE ER) 500 MG 24 hr tablet Take 500 mg by mouth at bedtime. 05/14/11 09/07/12  Maricela Curet, MD    Allergies as of 03/25/2014  . (No Known Allergies)    Family History  Problem Relation Age of Onset  . Colon cancer Neg Hx     History   Social History  . Marital Status: Single    Spouse Name: N/A    Number of Children: N/A  . Years of Education: N/A   Occupational History  . Not on file.   Social History Main Topics  . Smoking status: Never Smoker   . Smokeless tobacco: Not on file  . Alcohol  Use: No  . Drug Use: No  . Sexual Activity: No   Other Topics Concern  . Not on file   Social History Narrative    Review of Systems: See HPI, otherwise negative ROS   Physical Exam: BP 118/66 mmHg  Pulse 61  Temp(Src) 98.2 F (36.8 C) (Oral)  Resp 18  Ht _0  (1.753 m)  Wt 208 lb (94.348 kg)  BMI 30.70 kg/m2  SpO2 100% General:   Alert,  pleasant and cooperative in NAD Head:  Normocephalic and atraumatic. Neck:  Supple; Lungs:  Clear throughout to auscultation.    Heart:  Regular rate and rhythm. Abdomen:  Soft, nontender and nondistended. Normal bowel sounds, without guarding, and without rebound.   Neurologic:  Alert and  oriented x4;  grossly normal neurologically.  Impression/Plan:    SCREENING: COLON CA/dysphagia  Plan:  1. Tcs/egd/dil today

## 2014-04-12 NOTE — Discharge Instructions (Signed)
You had 1 SMALL polyp removed. You have A FLOPPY COLON AND SMALL internal hemorrhoids. 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  I biopsied your stomach.     CONTINUE ZANTAC.   AVOID ITEMS THAT TRIGGER GASTRITIS. SEE INFO BELOW.  FOLLOW A HIGH FIBER/LOW FAT DIET. AVOID ITEMS THAT CAUSE BLOATING. SEE INFO BELOW.  YOUR BIOPSY WILL BE BACK IN 14 DAYS OR YOU CAN LOOK THEM UP ON MY CHART AFTER JAN 8.  Next colonoscopy in 5-10 years.    ENDOSCOPY Care After Read the instructions outlined below and refer to this sheet in the next week. These discharge instructions provide you with general information on caring for yourself after you leave the hospital. While your treatment has been planned according to the most current medical practices available, unavoidable complications occasionally occur. If you have any problems or questions after discharge, call DR. Chadley Dziedzic, [phone].  ACTIVITY  You may resume your regular activity, but move at a slower pace for the next 24 hours.   Take frequent rest periods for the next 24 hours.   Walking will help get rid of the air and reduce the bloated feeling in your belly (abdomen).   No driving for 24 hours (because of the medicine (anesthesia) used during the test).   You may shower.   Do not sign any important legal documents or operate any machinery for 24 hours (because of the anesthesia used during the test).    NUTRITION  Drink plenty of fluids.   You may resume your normal diet as instructed by your doctor.   Begin with a light meal and progress to your normal diet. Heavy or fried foods are harder to digest and may make you feel sick to your stomach (nauseated).   Avoid alcoholic beverages for 24 hours or as instructed.    MEDICATIONS  You may resume your normal medications.   WHAT YOU CAN EXPECT TODAY  Some feelings of bloating in the abdomen.   Passage of more gas than usual.   Spotting of blood in your stool  or on the toilet paper  .  IF YOU HAD POLYPS REMOVED DURING THE ENDOSCOPY:  Eat a soft diet IF YOU HAVE NAUSEA, BLOATING, ABDOMINAL PAIN, OR VOMITING.    FINDING OUT THE RESULTS OF YOUR TEST Not all test results are available during your visit. DR. Oneida Alar WILL CALL YOU WITHIN 14 DAYS OF YOUR PROCEDUE WITH YOUR RESULTS. Do not assume everything is normal if you have not heard from DR. Arayla Kruschke, CALL HER OFFICE AT [phone].  SEEK IMMEDIATE MEDICAL ATTENTION AND CALL THE OFFICE: [phone] IF:  You have more than a spotting of blood in your stool.   Your belly is swollen (abdominal distention).   You are nauseated or vomiting.   You have a temperature over 101F.   You have abdominal pain or discomfort that is severe or gets worse throughout the day.   Gastritis  Gastritis is an inflammation (the body's way of reacting to injury and/or infection) of the stomach. It is often caused by viral or bacterial (germ) infections. It can also be caused BY ASPIRIN, BC/GOODY POWDER'S, (IBUPROFEN) MOTRIN, OR ALEVE (NAPROXEN), chemicals (including alcohol), SPICY FOODS, and medications. This illness may be associated with generalized malaise (feeling tired, not well), UPPER ABDOMINAL STOMACH cramps, and fever. One common bacterial cause of gastritis is an organism known as H. Pylori. This can be treated with antibiotics.   High-Fiber Diet A high-fiber diet changes your normal diet  to include more whole grains, legumes, fruits, and vegetables. Changes in the diet involve replacing refined carbohydrates with unrefined foods. The calorie level of the diet is essentially unchanged. The Dietary Reference Intake (recommended amount) for adult males is 38 grams per day. For adult females, it is 25 grams per day. Pregnant and lactating women should consume 28 grams of fiber per day. Fiber is the intact part of a plant that is not broken down during digestion. Functional fiber is fiber that has been isolated  from the plant to provide a beneficial effect in the body. PURPOSE  Increase stool bulk.   Ease and regulate bowel movements.   Lower cholesterol.  INDICATIONS THAT YOU NEED MORE FIBER  Constipation and hemorrhoids.   Uncomplicated diverticulosis (intestine condition) and irritable bowel syndrome.   Weight management.   As a protective measure against hardening of the arteries (atherosclerosis), diabetes, and cancer.   GUIDELINES FOR INCREASING FIBER IN THE DIET  Start adding fiber to the diet slowly. A gradual increase of about 5 more grams (2 slices of whole-wheat bread, 2 servings of most fruits or vegetables, or 1 bowl of high-fiber cereal) per day is best. Too rapid an increase in fiber may result in constipation, flatulence, and bloating.   Drink enough water and fluids to keep your urine clear or pale yellow. Water, juice, or caffeine-free drinks are recommended. Not drinking enough fluid may cause constipation.   Eat a variety of high-fiber foods rather than one type of fiber.   Try to increase your intake of fiber through using high-fiber foods rather than fiber pills or supplements that contain small amounts of fiber.   The goal is to change the types of food eaten. Do not supplement your present diet with high-fiber foods, but replace foods in your present diet.  INCLUDE A VARIETY OF FIBER SOURCES  Replace refined and processed grains with whole grains, canned fruits with fresh fruits, and incorporate other fiber sources. White rice, white breads, and most bakery goods contain little or no fiber.   Brown whole-grain rice, buckwheat oats, and many fruits and vegetables are all good sources of fiber. These include: broccoli, Brussels sprouts, cabbage, cauliflower, beets, sweet potatoes, white potatoes (skin on), carrots, tomatoes, eggplant, squash, berries, fresh fruits, and dried fruits.   Cereals appear to be the richest source of fiber. Cereal fiber is found in whole  grains and bran. Bran is the fiber-rich outer coat of cereal grain, which is largely removed in refining. In whole-grain cereals, the bran remains. In breakfast cereals, the largest amount of fiber is found in those with "bran" in their names. The fiber content is sometimes indicated on the label.   You may need to include additional fruits and vegetables each day.   In baking, for 1 cup white flour, you may use the following substitutions:   1 cup whole-wheat flour minus 2 tablespoons.   1/2 cup white flour plus 1/2 cup whole-wheat flour.   Low-Fat Diet BREADS, CEREALS, PASTA, RICE, DRIED PEAS, AND BEANS These products are high in carbohydrates and most are low in fat. Therefore, they can be increased in the diet as substitutes for fatty foods. They too, however, contain calories and should not be eaten in excess. Cereals can be eaten for snacks as well as for breakfast.  Include foods that contain fiber (fruits, vegetables, whole grains, and legumes). Research shows that fiber may lower blood cholesterol levels, especially the water-soluble fiber found in fruits, vegetables, oat products, and  legumes. FRUITS AND VEGETABLES It is good to eat fruits and vegetables. Besides being sources of fiber, both are rich in vitamins and some minerals. They help you get the daily allowances of these nutrients. Fruits and vegetables can be used for snacks and desserts. MEATS Limit lean meat, chicken, [place], and fish to no more than 6 ounces per day. Beef, Pork, and Lamb Use lean cuts of beef, pork, and lamb. Lean cuts include:  Extra-lean ground beef.  Arm roast.  Sirloin tip.  Center-cut ham.  Round steak.  Loin chops.  Rump roast.  Tenderloin.  Trim all fat off the outside of meats before cooking. It is not necessary to severely decrease the intake of red meat, but lean choices should be made. Lean meat is rich in protein and contains a highly absorbable form of iron. Premenopausal women, in  particular, should avoid reducing lean red meat because this could increase the risk for low red blood cells (iron-deficiency anemia). The organ meats, such as liver, sweetbreads, kidneys, and brain are very rich in cholesterol. They should be limited. Chicken and [place] These are good sources of protein. The fat of poultry can be reduced by removing the skin and underlying fat layers before cooking. Chicken and [place] can be substituted for lean red meat in the diet. Poultry should not be fried or covered with high-fat sauces. Fish and Shellfish Fish is a good source of protein. Shellfish contain cholesterol, but they usually are low in saturated fatty acids. The preparation of fish is important. Like chicken and [place], they should not be fried or covered with high-fat sauces. EGGS Egg whites contain no fat or cholesterol. They can be eaten often. Try 1 to 2 egg whites instead of whole eggs in recipes or use egg substitutes that do not contain yolk. MILK AND DAIRY PRODUCTS Use skim or 1% milk instead of 2% or whole milk. Decrease whole milk, natural, and processed cheeses. Use nonfat or low-fat (2%) cottage cheese or low-fat cheeses made from vegetable oils. Choose nonfat or low-fat (1 to 2%) yogurt. Experiment with evaporated skim milk in recipes that call for heavy cream. Substitute low-fat yogurt or low-fat cottage cheese for sour cream in dips and salad dressings. Have at least 2 servings of low-fat dairy products, such as 2 glasses of skim (or 1%) milk each day to help get your daily calcium intake.  FATS AND OILS Reduce the total intake of fats, especially saturated fat. Butterfat, lard, and beef fats are high in saturated fat and cholesterol. These should be avoided as much as possible. Vegetable fats do not contain cholesterol, but certain vegetable fats, such as coconut oil, palm oil, and palm kernel oil are very high in saturated fats. These should be limited. These fats are often used in  bakery goods, processed foods, popcorn, oils, and nondairy creamers. Vegetable shortenings and some peanut butters contain hydrogenated oils, which are also saturated fats. Read the labels on these foods and check for saturated vegetable oils. Unsaturated vegetable oils and fats do not raise blood cholesterol. However, they should be limited because they are fats and are high in calories. Total fat should still be limited to 30% of your daily caloric intake. Desirable liquid vegetable oils are corn oil, cottonseed oil, olive oil, canola oil, safflower oil, soybean oil, and sunflower oil. Peanut oil is not as good, but small amounts are acceptable. Buy a heart-healthy tub margarine that has no partially hydrogenated oils in the ingredients. Mayonnaise and salad dressings  often are made from unsaturated fats, but they should also be limited because of their high calorie and fat content. Seeds, nuts, peanut butter, olives, and avocados are high in fat, but the fat is mainly the unsaturated type. These foods should be limited mainly to avoid excess calories and fat. OTHER EATING TIPS Snacks  Most sweets should be limited as snacks. They tend to be rich in calories and fats, and their caloric content outweighs their nutritional value. Some good choices in snacks are graham crackers, melba toast, soda crackers, bagels (no egg), English muffins, fruits, and vegetables. These snacks are preferable to snack crackers, [place] fries, and chips. Popcorn should be air-popped or cooked in small amounts of liquid vegetable oil. Desserts Eat fruit, low-fat yogurt, and fruit ices. AVOID pastries, cake, and cookies. Sherbet, angel food cake, gelatin dessert, frozen low-fat yogurt, or other frozen products that do not contain saturated fat (pure fruit juice bars, frozen ice pops) are also acceptable.  COOKING METHODS Choose those methods that use little or no fat. They include: Poaching.  Braising.  Steaming.  Grilling.    Baking.  Stir-frying.  Broiling.  Microwaving.  Foods can be cooked in a nonstick pan without added fat, or use a nonfat cooking spray in regular cookware. Limit fried foods and avoid frying in saturated fat. Add moisture to lean meats by using water, broth, cooking wines, and other nonfat or low-fat sauces along with the cooking methods mentioned above. Soups and stews should be chilled after cooking. The fat that forms on top after a few hours in the refrigerator should be skimmed off. When preparing meals, avoid using excess salt. Salt can contribute to raising blood pressure in some people. EATING AWAY FROM HOME Order entres, potatoes, and vegetables without sauces or butter. When meat exceeds the size of a deck of cards (3 to 4 ounces), the rest can be taken home for another meal. Choose vegetable or fruit salads and ask for low-calorie salad dressings to be served on the side. Use dressings sparingly. Limit high-fat toppings, such as bacon, crumbled eggs, cheese, sunflower seeds, and olives. Ask for heart-healthy tub margarine instead of butter.  Polyps, Colon  A polyp is extra tissue that grows inside your body. Colon polyps grow in the large intestine. The large intestine, also called the colon, is part of your digestive system. It is a long, hollow tube at the end of your digestive tract where your body makes and stores stool. Most polyps are not dangerous. They are benign. This means they are not cancerous. But over time, some types of polyps can turn into cancer. Polyps that are smaller than a pea are usually not harmful. But larger polyps could someday become or may already be cancerous. To be safe, doctors remove all polyps and test them.   WHO GETS POLYPS? Anyone can get polyps, but certain people are more likely than others. You may have a greater chance of getting polyps if: You are over 50.  You have had polyps before.  Someone in your family has had polyps.  Someone in your  family has had cancer of the large intestine.  Find out if someone in your family has had polyps. You may also be more likely to get polyps if you:  Eat a lot of fatty foods  Smoke  Drink alcohol  Do not exercise Eat too much   TREATMENT The caregiver will remove the polyp during sigmoidoscopy or colonoscopy.  PREVENTION There is not one sure  way to prevent polyps. You might be able to lower your risk of getting them if you: Eat more fruits and vegetables and less fatty food.  Do not smoke.  Avoid alcohol.  Exercise every day.  Lose weight if you are overweight.  Eating more calcium and folate can also lower your risk of getting polyps. Some foods that are rich in calcium are milk, cheese, and broccoli. Some foods that are rich in folate are chickpeas, kidney beans, and spinach.

## 2014-04-12 NOTE — Anesthesia Postprocedure Evaluation (Signed)
  Anesthesia Post-op Note  Patient: Nathan [name]  Procedure(s) Performed: Procedure(s) with comments: COLONOSCOPY WITH PROPOFOL (N/A) - Cecum time in  0925     time out    0954    total time 29 minutes ESOPHAGOGASTRODUODENOSCOPY (EGD) WITH PROPOFOL (N/A) SAVORY DILATION (N/A) - 12.8/ 14/15/16 POLYPECTOMY (N/A) - Cecal BIOPSY (N/A) - Gastric  Patient Location: PACU  Anesthesia Type:MAC  Level of Consciousness: awake, alert  and oriented  Airway and Oxygen Therapy: Patient Spontanous Breathing  Post-op Pain: none  Post-op Assessment: Post-op Vital signs reviewed, Patient's Cardiovascular Status Stable, Respiratory Function Stable, Patent Airway and No signs of Nausea or vomiting  Post-op Vital Signs: Reviewed and stable  Last Vitals:  Filed Vitals:   04/12/14 0855  BP: 116/67  Pulse:   Temp:   Resp: 13    Complications: No apparent anesthesia complications

## 2014-04-12 NOTE — Anesthesia Preprocedure Evaluation (Signed)
Anesthesia Evaluation  Patient identified by MRN, date of birth, ID band Patient awake    Reviewed: Allergy & Precautions, NPO status , Patient's Chart, lab work & pertinent test results, reviewed documented beta blocker date and time   Airway Mallampati: II  TM Distance: >3 FB Neck ROM: Full   Comment: Incomplete cleft palate Dental  (+) Poor Dentition, Missing,  Distorted dentition due to partial cleft palalet.:   Pulmonary neg pulmonary ROS,  breath sounds clear to auscultation        Cardiovascular negative cardio ROS  Rate:Normal     Neuro/Psych PSYCHIATRIC DISORDERS Anxiety Depression Bipolar Disorder Schizophrenia    GI/Hepatic   Endo/Other  diabetes (elevated glucose)  Renal/GU Renal InsufficiencyRenal disease     Musculoskeletal   Abdominal   Peds  Hematology  (+) anemia ,   Anesthesia Other Findings HIV pos  Reproductive/Obstetrics                             Anesthesia Physical Anesthesia Plan  ASA: III  Anesthesia Plan: MAC   Post-op Pain Management:    Induction: Intravenous  Airway Management Planned: Simple Face Mask  Additional Equipment:   Intra-op Plan:   Post-operative Plan:   Informed Consent: I have reviewed the patients History and Physical, chart, labs and discussed the procedure including the risks, benefits and alternatives for the proposed anesthesia with the patient or authorized representative who has indicated his/her understanding and acceptance.   Dental advisory given  Plan Discussed with:   Anesthesia Plan Comments:         Anesthesia Quick Evaluation

## 2014-04-12 NOTE — Transfer of Care (Signed)
Immediate Anesthesia Transfer of Care Note  Patient: Nathan [name]  Procedure(s) Performed: Procedure(s) with comments: COLONOSCOPY WITH PROPOFOL (N/A) - Cecum time in  0925     time out    0954    total time 29 minutes ESOPHAGOGASTRODUODENOSCOPY (EGD) WITH PROPOFOL (N/A) SAVORY DILATION (N/A) - 12.8/ 14/15/16 POLYPECTOMY (N/A) - Cecal BIOPSY (N/A) - Gastric  Patient Location: PACU  Anesthesia Type:MAC  Level of Consciousness: awake  Airway & Oxygen Therapy: Patient Spontanous Breathing and Patient connected to nasal cannula oxygen  Post-op Assessment: Report given to PACU RN  Post vital signs: Reviewed  Complications: No apparent anesthesia complications

## 2014-04-13 NOTE — Op Note (Signed)
[place], [address]   COLONOSCOPY PROCEDURE REPORT  PATIENT: Nathan [name], Nathan [name]  MR#: [id number] BIRTHDATE: Jul 31, 1960 , 64  yrs. old GENDER: male ENDOSCOPIST: Barney Drain, MD REFERRED FY:[id number] [place], [facility] PROCEDURE DATE:  04/12/2014 PROCEDURE:   Colonoscopy with cold biopsy polypectomy INDICATIONS:average risk for colon cancer. MEDICATIONS: Monitored anesthesia care  DESCRIPTION OF PROCEDURE:    Physical exam was performed.  Informed consent was obtained from the patient after explaining the benefits, risks, and alternatives to procedure.  The patient was connected to monitor and placed in left lateral position. Continuous oxygen was provided by nasal cannula and IV medicine administered through an indwelling cannula.  After administration of sedation and rectal exam, the patients rectum was intubated and the     colonoscope was advanced under direct visualization to the cecum.  The scope was removed slowly by carefully examining the color, texture, anatomy, and integrity mucosa on the way out.  The patient was recovered in endoscopy and discharged home in satisfactory condition.    COLON FINDINGS: A sessile polyp measuring 4 mm in size was found.  A polypectomy was performed with cold forceps.  , The colon was redundant.  Manual abdominal counter-pressure was used to reach the cecum.  The patient was moved on to their back to reach the cecum, and Small internal hemorrhoids were found.  PREP QUALITY: good.CECAL W/D TIME: 22 MINS       COMPLICATIONS: None  ENDOSCOPIC IMPRESSION: 1.   ONE COLON polyp REMOVED 2.   The LEFT colon IS EXTREMELY redundant 3.   Small internal hemorrhoids  RECOMMENDATIONS: CONTINUE ZANTAC. AVOID ITEMS THAT TRIGGER GASTRITIS. FOLLOW A HIGH FIBER/LOW FAT DIET.  AVOID ITEMS THAT CAUSE BLOATING.  AWAIT BIOPSY. Next colonoscopy in 5 YEARS IF SIMPLE ADENOMA AND 10 years IF HYPERPLASTIC POLYP WITH  MAC/OVERTUBE      _______________________________ eSignedBarney Drain, MD 20-Apr-2014 1:25 PM    CPT CODES: ICD CODES:  The ICD and CPT codes recommended by this software are interpretations from the data that the clinical staff has captured with the software.  The verification of the translation of this report to the ICD and CPT codes and modifiers is the sole responsibility of the health care institution and practicing physician where this report was generated.  [employer]. will not be held responsible for the validity of the ICD and CPT codes included on this report.  AMA assumes no liability for data contained or not contained herein. CPT is a Designer, television/film set of the [employer].

## 2014-04-13 NOTE — Op Note (Signed)
[facility] [address] [place], [address]   ENDOSCOPY PROCEDURE REPORT  PATIENT: Nathan, [name]  MR#: [id number] BIRTHDATE: 07/21/1960 , 40  yrs. old GENDER: male  ENDOSCOPIST: Barney Drain, MD REFFERED NT:[name], [facility] PROCEDURE DATE:  04/12/2014 PROCEDURE:   EGD with biopsy and EGD with dilatation over guidewire   INDICATIONS:1.  dysphagia. MEDICATIONS: Monitored anesthesia care TOPICAL ANESTHETIC: Viscous Xylocaine  DESCRIPTION OF PROCEDURE:   After the risks benefits and alternatives of the procedure were thoroughly explained, informed consent was obtained.  The     endoscope was introduced through the mouth and advanced to the second portion of the duodenum. The instrument was slowly withdrawn as the mucosa was carefully examined.  Prior to withdrawal of the scope, the guidwire was placed.  The esophagus was dilated successfully.  The patient was recovered in endoscopy and discharged home in satisfactory condition.   ESOPHAGUS: A stricture was found at the gastroesophageal junction. The stenosis was traversable with the endoscope.   STOMACH: A small polyp was found in the gastric body.  Multiple biopsies was performed using cold forceps.   Mild non-erosive gastritis (inflammation) was found in the gastric antrum.  Multiple biopsies were performed using cold forceps.   Dilation was then performed at the gastroesphageal junction Dilator: Savary over guidewire Size(s): 12.8-16 mm Resistance: minimal Heme: yes  COMPLICATIONS: There were no immediate complications.  ENDOSCOPIC IMPRESSION: 1.   Stricture at the gastroesophageal junction 2.   Small polyp n the gastric body 3.   MILD Non-erosive gastritis  RECOMMENDATIONS: CONTINUE ZANTAC UNLESS PT HAS H PYLORI GASTRITIS.  IF HAS H PYLORI, THEN WILL NEED PPI.  WILL NEED TO DISCUSS WITH ID DUE TO NEED FOR REYATAZ. AVOID ITEMS THAT TRIGGER GASTRITIS. FOLLOW A HIGH FIBER/LOW FAT DIET.  AVOID  ITEMS THAT CAUSE BLOATING.  AWAIT BIOPSY Next colonoscopy in 5-10 years.   _______________________________ Lorrin MaisBarney Drain, MD 2014-05-02 1:31 PM   CPT CODES: ICD CODES:  The ICD and CPT codes recommended by this software are interpretations from the data that the clinical staff has captured with the software.  The verification of the translation of this report to the ICD and CPT codes and modifiers is the sole responsibility of the health care institution and practicing physician where this report was generated.  [employer]. will not be held responsible for the validity of the ICD and CPT codes included on this report.  AMA assumes no liability for data contained or not contained herein. CPT is a Designer, television/film set of the [employer].

## 2014-04-14 ENCOUNTER — Encounter (HOSPITAL_COMMUNITY): Payer: Self-pay | Admitting: Gastroenterology

## 2014-05-09 ENCOUNTER — Telehealth: Payer: Self-pay | Admitting: Gastroenterology

## 2014-05-09 NOTE — Telephone Encounter (Signed)
Please call pt. HE had a polypoid lesion removed and it was benign.Please call pt. His stomach Bx shows gastritis.   CONTINUE ZANTAC.  AVOID ITEMS THAT TRIGGER GASTRITIS.  FOLLOW A HIGH FIBER/LOW FAT DIET. AVOID ITEMS THAT CAUSE BLOATING.   OPV IN 3 MOS E30 DYSPHAGIA, GASTRITIS Next colonoscopy in 10 years WITH AN OVERTUBE/MAC.

## 2014-05-10 ENCOUNTER — Encounter: Payer: Self-pay | Admitting: Gastroenterology

## 2014-05-10 NOTE — Telephone Encounter (Signed)
APPOINTMENT MADE AND LETTER SENT °

## 2014-05-11 NOTE — Telephone Encounter (Signed)
[name] @ [facility] is aware of results.

## 2014-06-18 ENCOUNTER — Encounter (HOSPITAL_COMMUNITY): Payer: Self-pay | Admitting: Emergency Medicine

## 2014-06-18 ENCOUNTER — Inpatient Hospital Stay (HOSPITAL_COMMUNITY)
Admission: EM | Admit: 2014-06-18 | Discharge: 2014-06-25 | DRG: 682 | Disposition: A | Discharge status: Nursing Home (Includes ICF) | Payer: Medicare Other | Attending: Family Medicine | Admitting: Family Medicine

## 2014-06-18 ENCOUNTER — Emergency Department (HOSPITAL_COMMUNITY): Payer: Medicare Other

## 2014-06-18 DIAGNOSIS — J1100 Influenza due to unidentified influenza virus with unspecified type of pneumonia: Secondary | ICD-10-CM | POA: Diagnosis present

## 2014-06-18 DIAGNOSIS — K219 Gastro-esophageal reflux disease without esophagitis: Secondary | ICD-10-CM | POA: Diagnosis present

## 2014-06-18 DIAGNOSIS — R509 Fever, unspecified: Secondary | ICD-10-CM

## 2014-06-18 DIAGNOSIS — T56891A Toxic effect of other metals, accidental (unintentional), initial encounter: Secondary | ICD-10-CM | POA: Diagnosis not present

## 2014-06-18 DIAGNOSIS — I129 Hypertensive chronic kidney disease with stage 1 through stage 4 chronic kidney disease, or unspecified chronic kidney disease: Secondary | ICD-10-CM | POA: Diagnosis present

## 2014-06-18 DIAGNOSIS — E870 Hyperosmolality and hypernatremia: Secondary | ICD-10-CM

## 2014-06-18 DIAGNOSIS — Z781 Physical restraint status: Secondary | ICD-10-CM | POA: Diagnosis not present

## 2014-06-18 DIAGNOSIS — D649 Anemia, unspecified: Secondary | ICD-10-CM | POA: Diagnosis present

## 2014-06-18 DIAGNOSIS — N184 Chronic kidney disease, stage 4 (severe): Secondary | ICD-10-CM | POA: Diagnosis present

## 2014-06-18 DIAGNOSIS — R4182 Altered mental status, unspecified: Secondary | ICD-10-CM

## 2014-06-18 DIAGNOSIS — N179 Acute kidney failure, unspecified: Secondary | ICD-10-CM | POA: Diagnosis present

## 2014-06-18 DIAGNOSIS — G9340 Encephalopathy, unspecified: Secondary | ICD-10-CM

## 2014-06-18 DIAGNOSIS — F79 Unspecified intellectual disabilities: Secondary | ICD-10-CM

## 2014-06-18 DIAGNOSIS — F3162 Bipolar disorder, current episode mixed, moderate: Secondary | ICD-10-CM | POA: Diagnosis present

## 2014-06-18 DIAGNOSIS — Z21 Asymptomatic human immunodeficiency virus [HIV] infection status: Secondary | ICD-10-CM | POA: Diagnosis present

## 2014-06-18 DIAGNOSIS — R739 Hyperglycemia, unspecified: Secondary | ICD-10-CM | POA: Diagnosis not present

## 2014-06-18 DIAGNOSIS — B20 Human immunodeficiency virus [HIV] disease: Secondary | ICD-10-CM | POA: Diagnosis present

## 2014-06-18 DIAGNOSIS — T43591A Poisoning by other antipsychotics and neuroleptics, accidental (unintentional), initial encounter: Secondary | ICD-10-CM | POA: Diagnosis present

## 2014-06-18 DIAGNOSIS — N183 Chronic kidney disease, stage 3 unspecified: Secondary | ICD-10-CM | POA: Diagnosis present

## 2014-06-18 DIAGNOSIS — J189 Pneumonia, unspecified organism: Secondary | ICD-10-CM

## 2014-06-18 DIAGNOSIS — E860 Dehydration: Secondary | ICD-10-CM | POA: Diagnosis present

## 2014-06-18 DIAGNOSIS — F209 Schizophrenia, unspecified: Secondary | ICD-10-CM | POA: Diagnosis present

## 2014-06-18 DIAGNOSIS — G92 Toxic encephalopathy: Secondary | ICD-10-CM | POA: Diagnosis present

## 2014-06-18 DIAGNOSIS — T56891D Toxic effect of other metals, accidental (unintentional), subsequent encounter: Secondary | ICD-10-CM | POA: Diagnosis not present

## 2014-06-18 DIAGNOSIS — R338 Other retention of urine: Secondary | ICD-10-CM

## 2014-06-18 DIAGNOSIS — R5383 Other fatigue: Secondary | ICD-10-CM | POA: Diagnosis present

## 2014-06-18 LAB — CBC WITH DIFFERENTIAL/PLATELET
BASOS PCT: 0 % (ref 0–1)
Basophils Absolute: 0 10*3/uL (ref 0.0–0.1)
EOS ABS: 0.3 10*3/uL (ref 0.0–0.7)
Eosinophils Relative: 3 % (ref 0–5)
HCT: 37.1 % — ABNORMAL LOW (ref 39.0–52.0)
HEMOGLOBIN: 11.7 g/dL — AB (ref 13.0–17.0)
LYMPHS ABS: 1.6 10*3/uL (ref 0.7–4.0)
Lymphocytes Relative: 19 % (ref 12–46)
MCH: 29.8 pg (ref 26.0–34.0)
MCHC: 31.5 g/dL (ref 30.0–36.0)
MCV: 94.4 fL (ref 78.0–100.0)
MONO ABS: 0.5 10*3/uL (ref 0.1–1.0)
MONOS PCT: 6 % (ref 3–12)
NEUTROS PCT: 72 % (ref 43–77)
Neutro Abs: 6 10*3/uL (ref 1.7–7.7)
PLATELETS: 183 10*3/uL (ref 150–400)
RBC: 3.93 MIL/uL — AB (ref 4.22–5.81)
RDW: 13.3 % (ref 11.5–15.5)
WBC: 8.4 10*3/uL (ref 4.0–10.5)

## 2014-06-18 LAB — URINALYSIS, ROUTINE W REFLEX MICROSCOPIC
BILIRUBIN URINE: NEGATIVE
GLUCOSE, UA: NEGATIVE mg/dL
Ketones, ur: NEGATIVE mg/dL
LEUKOCYTES UA: NEGATIVE
Nitrite: NEGATIVE
Protein, ur: NEGATIVE mg/dL
Urobilinogen, UA: 0.2 mg/dL (ref 0.0–1.0)
pH: 6.5 (ref 5.0–8.0)

## 2014-06-18 LAB — COMPREHENSIVE METABOLIC PANEL
ALBUMIN: 4.3 g/dL (ref 3.5–5.2)
ALT: 41 U/L (ref 0–53)
ANION GAP: 4 — AB (ref 5–15)
AST: 27 U/L (ref 0–37)
Alkaline Phosphatase: 53 U/L (ref 39–117)
BUN: 21 mg/dL (ref 6–23)
CHLORIDE: 108 mmol/L (ref 96–112)
CO2: 30 mmol/L (ref 19–32)
CREATININE: 2.23 mg/dL — AB (ref 0.50–1.35)
Calcium: 10 mg/dL (ref 8.4–10.5)
GFR calc Af Amer: 36 mL/min — ABNORMAL LOW (ref >90)
GFR, EST NON AFRICAN AMERICAN: 31 mL/min — AB (ref >90)
Glucose, Bld: 139 mg/dL — ABNORMAL HIGH (ref 70–99)
Potassium: 4.3 mmol/L (ref 3.5–5.1)
Sodium: 142 mmol/L (ref 135–145)
TOTAL PROTEIN: 8.8 g/dL — AB (ref 6.0–8.3)
Total Bilirubin: 1.4 mg/dL — ABNORMAL HIGH (ref 0.3–1.2)

## 2014-06-18 LAB — VALPROIC ACID LEVEL: VALPROIC ACID LVL: 20.5 ug/mL — AB (ref 50.0–100.0)

## 2014-06-18 LAB — URINE MICROSCOPIC-ADD ON

## 2014-06-18 LAB — LITHIUM LEVEL: Lithium Lvl: 1.76 mmol/L (ref 0.80–1.40)

## 2014-06-18 MED ORDER — CETYLPYRIDINIUM CHLORIDE 0.05 % MT LIQD
7.0000 mL | Freq: Two times a day (BID) | OROMUCOSAL | Status: DC
  Administered 2014-06-18 – 2014-06-25 (×10): 7 mL via OROMUCOSAL

## 2014-06-18 MED ORDER — FERROUS SULFATE 325 (65 FE) MG PO TABS
325.0000 mg | ORAL_TABLET | Freq: Every day | ORAL | Status: DC
  Administered 2014-06-23 – 2014-06-25 (×3): 325 mg via ORAL
  Filled 2014-06-18 (×6): qty 1

## 2014-06-18 MED ORDER — SENNOSIDES-DOCUSATE SODIUM 8.6-50 MG PO TABS: 1.0000 | ORAL_TABLET | Freq: Every evening | ORAL | Status: DC | PRN

## 2014-06-18 MED ORDER — LORAZEPAM 2 MG/ML IJ SOLN
INTRAMUSCULAR | Status: AC
  Filled 2014-06-18: qty 1

## 2014-06-18 MED ORDER — FAMOTIDINE 20 MG PO TABS
20.0000 mg | ORAL_TABLET | Freq: Every day | ORAL | Status: DC
Start: 2014-06-18 — End: 2014-06-18

## 2014-06-18 MED ORDER — LEVOFLOXACIN IN D5W 750 MG/150ML IV SOLN
750.0000 mg | Freq: Once | INTRAVENOUS | Status: AC
  Administered 2014-06-18: 750 mg via INTRAVENOUS
  Filled 2014-06-18: qty 150

## 2014-06-18 MED ORDER — SODIUM CHLORIDE 0.9 % IJ SOLN
3.0000 mL | Freq: Two times a day (BID) | INTRAMUSCULAR | Status: DC
  Administered 2014-06-18 – 2014-06-24 (×7): 3 mL via INTRAVENOUS

## 2014-06-18 MED ORDER — SODIUM CHLORIDE 0.9 % IV SOLN
INTRAVENOUS | Status: DC
Start: 2014-06-18 — End: 2014-06-20
  Administered 2014-06-18 – 2014-06-19 (×3): via INTRAVENOUS

## 2014-06-18 MED ORDER — VANCOMYCIN HCL 10 G IV SOLR
1250.0000 mg | Freq: Once | INTRAVENOUS | Status: DC
  Filled 2014-06-18: qty 1250

## 2014-06-18 MED ORDER — ONDANSETRON HCL 4 MG/2ML IJ SOLN: 4.0000 mg | Freq: Four times a day (QID) | INTRAMUSCULAR | Status: DC | PRN

## 2014-06-18 MED ORDER — HEPARIN SODIUM (PORCINE) 5000 UNIT/ML IJ SOLN
5000.0000 [IU] | Freq: Three times a day (TID) | INTRAMUSCULAR | Status: DC
  Administered 2014-06-18 – 2014-06-25 (×20): 5000 [IU] via SUBCUTANEOUS
  Filled 2014-06-18 (×19): qty 1

## 2014-06-18 MED ORDER — ATAZANAVIR SULFATE 150 MG PO CAPS
300.0000 mg | ORAL_CAPSULE | Freq: Every day | ORAL | Status: DC
  Administered 2014-06-22 – 2014-06-25 (×4): 300 mg via ORAL
  Filled 2014-06-18 (×9): qty 2

## 2014-06-18 MED ORDER — LAMIVUDINE 150 MG PO TABS
150.0000 mg | ORAL_TABLET | Freq: Every day | ORAL | Status: DC
  Administered 2014-06-23 – 2014-06-25 (×3): 150 mg via ORAL
  Filled 2014-06-18 (×10): qty 1

## 2014-06-18 MED ORDER — SODIUM CHLORIDE 0.9 % IV BOLUS (SEPSIS)
1000.0000 mL | Freq: Once | INTRAVENOUS | Status: AC
  Administered 2014-06-18: 1000 mL via INTRAVENOUS

## 2014-06-18 MED ORDER — CALCITRIOL 0.25 MCG PO CAPS
0.2500 ug | ORAL_CAPSULE | Freq: Every day | ORAL | Status: DC
  Administered 2014-06-23 – 2014-06-25 (×3): 0.25 ug via ORAL
  Filled 2014-06-18 (×6): qty 1

## 2014-06-18 MED ORDER — LORAZEPAM 2 MG/ML IJ SOLN
0.5000 mg | Freq: Once | INTRAMUSCULAR | Status: AC
  Administered 2014-06-18: 0.5 mg via INTRAVENOUS

## 2014-06-18 MED ORDER — DEXTROSE 5 % IV SOLN: 2.0000 g | Freq: Once | INTRAVENOUS | Status: DC

## 2014-06-18 MED ORDER — OLANZAPINE 5 MG PO TABS
20.0000 mg | ORAL_TABLET | Freq: Every day | ORAL | Status: DC
  Administered 2014-06-18: 20 mg via ORAL
  Filled 2014-06-18 (×2): qty 4

## 2014-06-18 MED ORDER — ACETAMINOPHEN 325 MG PO TABS
650.0000 mg | ORAL_TABLET | Freq: Four times a day (QID) | ORAL | Status: DC | PRN
Start: 2014-06-18 — End: 2014-06-25
  Administered 2014-06-18: 650 mg via ORAL
  Filled 2014-06-18: qty 2

## 2014-06-18 MED ORDER — GABAPENTIN 600 MG PO TABS
600.0000 mg | ORAL_TABLET | Freq: Two times a day (BID) | ORAL | Status: DC
  Filled 2014-06-18 (×4): qty 1

## 2014-06-18 MED ORDER — TAMSULOSIN HCL 0.4 MG PO CAPS
0.4000 mg | ORAL_CAPSULE | Freq: Every day | ORAL | Status: DC
  Administered 2014-06-18 – 2014-06-25 (×4): 0.4 mg via ORAL
  Filled 2014-06-18 (×7): qty 1

## 2014-06-18 MED ORDER — FAMOTIDINE 20 MG PO TABS
20.0000 mg | ORAL_TABLET | ORAL | Status: DC
  Administered 2014-06-22 – 2014-06-24 (×3): 20 mg via ORAL
  Filled 2014-06-18 (×4): qty 1

## 2014-06-18 MED ORDER — ONDANSETRON HCL 4 MG PO TABS: 4.0000 mg | ORAL_TABLET | Freq: Four times a day (QID) | ORAL | Status: DC | PRN

## 2014-06-18 MED ORDER — ACETAMINOPHEN 650 MG RE SUPP
650.0000 mg | Freq: Four times a day (QID) | RECTAL | Status: DC | PRN
  Administered 2014-06-20: 650 mg via RECTAL
  Filled 2014-06-18: qty 1

## 2014-06-18 MED ORDER — ABACAVIR SULFATE 300 MG PO TABS
600.0000 mg | ORAL_TABLET | Freq: Every day | ORAL | Status: DC
  Administered 2014-06-23 – 2014-06-25 (×3): 600 mg via ORAL
  Filled 2014-06-18 (×9): qty 2

## 2014-06-18 MED ORDER — CLONAZEPAM 0.5 MG PO TABS
2.0000 mg | ORAL_TABLET | Freq: Every day | ORAL | Status: DC
  Administered 2014-06-18 – 2014-06-24 (×3): 2 mg via ORAL
  Filled 2014-06-18 (×4): qty 4

## 2014-06-18 MED ORDER — RITONAVIR 100 MG PO TABS
100.0000 mg | ORAL_TABLET | Freq: Every day | ORAL | Status: DC
  Administered 2014-06-22 – 2014-06-25 (×4): 100 mg via ORAL
  Filled 2014-06-18 (×9): qty 1

## 2014-06-18 MED ORDER — OSELTAMIVIR PHOSPHATE 30 MG PO CAPS
30.0000 mg | ORAL_CAPSULE | Freq: Two times a day (BID) | ORAL | Status: AC
  Administered 2014-06-18: 30 mg via ORAL
  Filled 2014-06-18 (×2): qty 1

## 2014-06-18 MED ORDER — SODIUM CHLORIDE 0.9 % IV SOLN: INTRAVENOUS | Status: AC

## 2014-06-18 MED ORDER — TRAZODONE HCL 50 MG PO TABS
100.0000 mg | ORAL_TABLET | Freq: Every day | ORAL | Status: DC
  Administered 2014-06-18 – 2014-06-24 (×4): 100 mg via ORAL
  Filled 2014-06-18 (×5): qty 2

## 2014-06-18 MED ORDER — LEVOFLOXACIN IN D5W 750 MG/150ML IV SOLN
750.0000 mg | INTRAVENOUS | Status: DC
  Administered 2014-06-20 – 2014-06-22 (×2): 750 mg via INTRAVENOUS
  Filled 2014-06-18 (×2): qty 150

## 2014-06-18 MED ORDER — DIVALPROEX SODIUM 250 MG PO DR TAB
500.0000 mg | DELAYED_RELEASE_TABLET | Freq: Every day | ORAL | Status: DC
  Administered 2014-06-18 – 2014-06-24 (×4): 500 mg via ORAL
  Filled 2014-06-18 (×5): qty 2

## 2014-06-18 MED ORDER — OSELTAMIVIR PHOSPHATE 75 MG PO CAPS: 75.0000 mg | ORAL_CAPSULE | Freq: Two times a day (BID) | ORAL | Status: DC

## 2014-06-18 NOTE — Progress Notes (Signed)
ANTIBIOTIC CONSULT NOTE - FOLLOW UP  Pharmacy Consult for Levaquin Indication: pneumonia  No Known Allergies  Patient Measurements: Height: 5' 8.9" (175 cm) Weight: 207 lb 14.3 oz (94.3 kg) IBW/kg (Calculated) : 70.47   Vital Signs: Temp: 99.2 F (37.3 C) (03/12 1553) Temp Source: Oral (03/12 1553) BP: 122/51 mmHg (03/12 1553) Pulse Rate: 71 (03/12 1553) Intake/Output from previous day:   Intake/Output from this shift:    Labs:  Recent Labs  06/18/14 1121  WBC 8.4  HGB 11.7*  PLT 183  CREATININE 2.23*   Estimated Creatinine Clearance: 42.4 mL/min (by C-G formula based on Cr of 2.23). No results for input(s): VANCOTROUGH, VANCOPEAK, VANCORANDOM, GENTTROUGH, GENTPEAK, GENTRANDOM, TOBRATROUGH, TOBRAPEAK, TOBRARND, AMIKACINPEAK, AMIKACINTROU, AMIKACIN in the last 72 hours.   Microbiology: No results found for this or any previous visit (from the past 720 hour(s)).  Anti-infectives    Start     Dose/Rate Route Frequency Ordered Stop   06/19/14 1000  abacavir (ZIAGEN) tablet 600 mg     600 mg Oral Daily 06/18/14 1517     06/19/14 0800  atazanavir (REYATAZ) capsule 300 mg     300 mg Oral Daily with breakfast 06/18/14 1517     06/19/14 0800  ritonavir (NORVIR) tablet 100 mg     100 mg Oral Daily with breakfast 06/18/14 1517     06/18/14 1600  vancomycin (VANCOCIN) 1,250 mg in sodium chloride 0.9 % 250 mL IVPB  Status:  Discontinued     1,250 mg 166.7 mL/hr over 90 Minutes Intravenous  Once 06/18/14 1453 06/18/14 1517   06/18/14 1545  levofloxacin (LEVAQUIN) IVPB 750 mg     750 mg 100 mL/hr over 90 Minutes Intravenous  Once 06/18/14 1542     06/18/14 1530  oseltamivir (TAMIFLU) capsule 75 mg     75 mg Oral 2 times daily 06/18/14 1517 06/23/14 0959   06/18/14 1530  lamiVUDine (EPIVIR) tablet 150 mg     150 mg Oral Daily 06/18/14 1517     06/18/14 1500  ceFEPIme (MAXIPIME) 2 g in dextrose 5 % 50 mL IVPB  Status:  Discontinued     2 g 100 mL/hr over 30 Minutes  Intravenous  Once 06/18/14 1453 06/18/14 1517     Assessment: Okay for Protocol, Levaquin for CAP.  Poor renal function.  Goal of Therapy:  Eradicate infection.   Plan:  Levaquin 750mg  IV every 48 hours. Monitor labs, micro and vitals.  Pricilla Larsson 06/18/2014,4:38 PM

## 2014-06-18 NOTE — ED Notes (Addendum)
Patient from [facility], per staff patient not acting like himself. Patient has cough, nausea, vomiting, diarhhea, and fever. When patient is asked about abd pain he shakes his head yes but unable to show staff where. Per [facility] staff flu has been very prevalent at facility recently. Patient usual talkative, responds well, and energiactic. Patient not verbally responding-will shake his head and does follow commands, which staff says is highly unlike patient even when he feels bad. Patient's gait slightly unsteady.

## 2014-06-18 NOTE — ED Notes (Signed)
Patient is resting comfortably. 

## 2014-06-18 NOTE — Progress Notes (Signed)
Since arrival from ED, pt has been combative with staff, agitated, restless and continues to try to get out of the bed, which is not safe for the pt at this time. I spoke with Dr. Jerilee Hoh who ordered a one time dose of Ativan. Pt also had to be put into soft wrist restraints, which MD ordered if Ativan was not effective. Will continue to monitor pt and restraints to assess if they are still needed

## 2014-06-18 NOTE — Progress Notes (Signed)
Soft wrist restraint to right wrist removed. Pt calm and cooperative at this time. Will continue to monitor and assess need for other wrist restraint

## 2014-06-18 NOTE — Progress Notes (Signed)
Pharmacy Note:  Initial antibiotics for Vancomycin and Cefepime ordered by EDP for HCAP.  CrCl cannot be calculated (Unknown ideal weight.).   No Known Allergies  Filed Vitals:   06/18/14 1435  BP: 112/52  Pulse: 68  Resp: 16    Anti-infectives    None      Plan: Initial doses of Vancomycin 1250mg  and Cefepime 2gm X 1 ordered. F/U admission orders for further dosing if therapy continued.  Pricilla Larsson, [facility] 06/18/2014 2:51 PM

## 2014-06-18 NOTE — H&P (Addendum)
             [facility]          History and Physical    PCP:   Gusler, Christin Z, NP   Chief Complaint:  Lethargy, tremors  HPI: Patient is a 54-year-old man with history significant for AIDS, bipolar disorder on chronic therapy with lithium, schizophrenia, mental retardation and chronic kidney disease stage III who presents to the hospital with the above-mentioned complaint. Patient is too confused to provide any history. I have discussed patient with staff at [facility] assisted living facility. They state that at baseline he is interactive, performs all of his ADLs independently including walking, dressing himself and eating. He can talk without any issues. Upon my assessment of the patient today, he is lying in bed, restless with tremors, mumbles nonsensically. He has been in and out cathed 2 times with greater than 1000 mL postvoid residual. They're currently in the process of inserting a Foley catheter. In the emergency department he is found to have a creatinine of 2.23, a lithium level of 1.76 and has been referred to us for admission for further evaluation and management.  Allergies:  No Known Allergies    Past Medical History  Diagnosis Date  . HIV (human immunodeficiency virus infection)   . Schizophrenia   . MR (mental retardation)   . Anemia   . Anxiety   . Bipolar 1 disorder   . Depression, major   . Lithium toxicity   . Hypernatremia   . Kidney failure   . Anemia     Past Surgical History  Procedure Laterality Date  . Nose surgery  1968    unsure what type of surgery. something to help him breathe better  . Colonoscopy with propofol N/A 04/12/2014    Procedure: COLONOSCOPY WITH PROPOFOL;  Surgeon: Sandi L Fields, MD;  Location: [facility];  Service: Endoscopy;  Laterality: N/A;  [place]ecum time in  0925     time out    0954    total time 29 minutes  . Esophagogastroduodenoscopy (egd) with propofol N/A 04/12/2014    Procedure: ESOPHAGOGASTRODUODENOSCOPY (EGD)  WITH PROPOFOL;  Surgeon: Sandi L Fields, MD;  Location: [facility];  Service: Endoscopy;  Laterality: N/A;  . Savory dilation N/A 04/12/2014    Procedure: SAVORY DILATION;  Surgeon: Sandi L Fields, MD;  Location: [facility];  Service: Endoscopy;  Laterality: N/A;  12.8/ 14/15/16  . Polypectomy N/A 04/12/2014    Procedure: POLYPECTOMY;  Surgeon: Sandi L Fields, MD;  Location: [facility];  Service: Endoscopy;  Laterality: N/A;  Cecal  . Esophageal biopsy N/A 04/12/2014    Procedure: BIOPSY;  Surgeon: Sandi L Fields, MD;  Location: [facility];  Service: Endoscopy;  Laterality: N/A;  Gastric    Prior to Admission medications   Medication Sig Start Date End Date Taking? Authorizing Provider  abacavir (ZIAGEN) 300 MG tablet Take 2 tablets (600 mg total) by mouth daily. 09/07/12  Yes Cornelius N Van Dam, MD  calcitRIOL (ROCALTROL) 0.25 MCG capsule Take 0.25 mcg by mouth daily.   Yes Historical Provider, MD  clonazePAM (KLONOPIN) 2 MG tablet Take 2 mg by mouth at bedtime.   Yes Historical Provider, MD  divalproex (DEPAKOTE) 250 MG DR tablet Take 500 mg by mouth at bedtime.    Yes Historical Provider, MD  ergocalciferol (VITAMIN D2) 50000 UNITS capsule Take 50,000 Units by mouth once a week.   Yes Historical Provider, MD  ferrous sulfate (FERROUSUL) 325 (  65 FE) MG tablet Take 325 mg by mouth daily.    Yes Historical Provider, MD  gabapentin (NEURONTIN) 600 MG tablet Take 600 mg by mouth 3 (three) times daily.    Yes Historical Provider, MD  lamiVUDine (EPIVIR) 150 MG tablet Take 1 tablet (150 mg total) by mouth daily. 07/22/12  Yes Cornelius N Van Dam, MD  lisinopril (PRINIVIL,ZESTRIL) 5 MG tablet Take 5 mg by mouth daily.   Yes Historical Provider, MD  lithium carbonate (ESKALITH) 450 MG CR tablet Take 450 mg by mouth 2 (two) times daily.   Yes Historical Provider, MD  OLANZapine (ZYPREXA) 20 MG tablet Take 20 mg by mouth at bedtime.   Yes Historical Provider, MD  oseltamivir (TAMIFLU) 75 MG capsule Take 75 mg by mouth 2 (two)  times daily. For 5 days(started on 06/14/14 to 06/19/14 @@8pm)   Yes Historical Provider, MD  ranitidine (ZANTAC) 150 MG tablet TAKE 1 TABLET BY MOUTH 12 HOURS AFTER TAKING REYATAZ. 10/31/12  Yes Cornelius N Van Dam, MD  REYATAZ 300 MG capsule TAKE (1) CAPSULE BY MOUTH ONCE DAILY WITH BREAKFAST.   Yes Cornelius N Van Dam, MD  ritonavir (NORVIR) 100 MG TABS Take 100 mg by mouth daily with breakfast.   Yes Historical Provider, MD  Tamsulosin HCl (FLOMAX) 0.4 MG CAPS Take 0.4 mg by mouth daily.   Yes Historical Provider, MD  traZODone (DESYREL) 100 MG tablet Take 100 mg by mouth at bedtime.   Yes Historical Provider, MD    Social History:  reports that he has never smoked. He has never used smokeless tobacco. He reports that he does not drink alcohol or use illicit drugs.  Family History  Problem Relation Age of Onset  . Colon cancer Neg Hx     Review of Systems:  Unable to obtain given current mental state  Physical Exam: Blood pressure 112/52, pulse 68, resp. rate 16, SpO2 96 %. General: Awake, mumbles nonsensically, with tremors and restless is trying to get out of bed. HEENT: Normocephalic, atraumatic, pupils equal round and reactive to light, very dry mucous membranes with cracked lips and tongue. Neck: Supple, no JVD, no lymphadenopathy, no bruits, no goiter. Cardiovascular: Regular rate and rhythm, no murmurs, rubs or gallops. Lungs: Clear to auscultation bilaterally. Abdomen: Soft, nontender, nondistended, positive bowel sounds, no masses or organomegaly noted. Nissen extremities: No clubbing, cyanosis or edema, positive pedal pulses. Neurologic: Unable to fully assess given current mental state, however I have seen him ambulating and he moves all 4 spontaneously.  Labs on Admission:  Results for orders placed or performed during the hospital encounter of 06/18/14 (from the past 48 hour(s))  CBC with Differential/Platelet     Status: Abnormal   Collection Time: 06/18/14 11:21 AM    Result Value Ref Range   WBC 8.4 4.0 - 10.5 K/uL   RBC 3.93 (L) 4.22 - 5.81 MIL/uL   Hemoglobin 11.7 (L) 13.0 - 17.0 g/dL   HCT 37.1 (L) 39.0 - 52.0 %   MCV 94.4 78.0 - 100.0 fL   MCH 29.8 26.0 - 34.0 pg   MCHC 31.5 30.0 - 36.0 g/dL   RDW 13.3 11.5 - 15.5 %   Platelets 183 150 - 400 K/uL   Neutrophils Relative % 72 43 - 77 %   Neutro Abs 6.0 1.7 - 7.7 K/uL   Lymphocytes Relative 19 12 - 46 %   Lymphs Abs 1.6 0.7 - 4.0 K/uL   Monocytes Relative 6 3 - 12 %     Monocytes Absolute 0.5 0.1 - 1.0 K/uL   Eosinophils Relative 3 0 - 5 %   Eosinophils Absolute 0.3 0.0 - 0.7 K/uL   Basophils Relative 0 0 - 1 %   Basophils Absolute 0.0 0.0 - 0.1 K/uL  Comprehensive metabolic panel     Status: Abnormal   Collection Time: 06/18/14 11:21 AM  Result Value Ref Range   Sodium 142 135 - 145 mmol/L   Potassium 4.3 3.5 - 5.1 mmol/L   Chloride 108 96 - 112 mmol/L   CO2 30 19 - 32 mmol/L   Glucose, Bld 139 (H) 70 - 99 mg/dL   BUN 21 6 - 23 mg/dL   Creatinine, Ser 2.23 (H) 0.50 - 1.35 mg/dL   Calcium 10.0 8.4 - 10.5 mg/dL   Total Protein 8.8 (H) 6.0 - 8.3 g/dL   Albumin 4.3 3.5 - 5.2 g/dL   AST 27 0 - 37 U/L   ALT 41 0 - 53 U/L   Alkaline Phosphatase 53 39 - 117 U/L   Total Bilirubin 1.4 (H) 0.3 - 1.2 mg/dL   GFR calc non Af Amer 31 (L) >90 mL/min   GFR calc Af Amer 36 (L) >90 mL/min    Comment: (NOTE) The eGFR has been calculated using the CKD EPI equation. This calculation has not been validated in all clinical situations. eGFR's persistently <90 mL/min signify possible Chronic Kidney Disease.    Anion gap 4 (L) 5 - 15  Lithium level     Status: Abnormal   Collection Time: 06/18/14 11:21 AM  Result Value Ref Range   Lithium Lvl 1.76 (HH) 0.80 - 1.40 mmol/L    Comment: REPEATED TO VERIFY CRITICAL RESULT CALLED TO, READ BACK BY AND VERIFIED WITH: VOGLER,T AT 1239 ON 06/18/14 BY MOSLEY,J   Valproic acid level     Status: Abnormal   Collection Time: 06/18/14 11:21 AM  Result Value Ref  Range   Valproic Acid Lvl 20.5 (L) 50.0 - 100.0 ug/mL  Urinalysis, Routine w reflex microscopic     Status: Abnormal   Collection Time: 06/18/14 11:55 AM  Result Value Ref Range   Color, Urine YELLOW YELLOW   APPearance CLEAR CLEAR   Specific Gravity, Urine <1.005 (L) 1.005 - 1.030   pH 6.5 5.0 - 8.0   Glucose, UA NEGATIVE NEGATIVE mg/dL   Hgb urine dipstick TRACE (A) NEGATIVE   Bilirubin Urine NEGATIVE NEGATIVE   Ketones, ur NEGATIVE NEGATIVE mg/dL   Protein, ur NEGATIVE NEGATIVE mg/dL   Urobilinogen, UA 0.2 0.0 - 1.0 mg/dL   Nitrite NEGATIVE NEGATIVE   Leukocytes, UA NEGATIVE NEGATIVE  Urine microscopic-add on     Status: None   Collection Time: 06/18/14 11:55 AM  Result Value Ref Range   Squamous Epithelial / LPF RARE RARE   RBC / HPF 0-2 <3 RBC/hpf   Bacteria, UA RARE RARE    Radiological Exams on Admission: Dg Chest Port 1 View  06/18/2014   CLINICAL DATA:  54 year old male with a history of altered mental status. Abdominal pain.  EXAM: PORTABLE CHEST - 1 VIEW  COMPARISON:  05/05/2011  FINDINGS: Cardiomediastinal silhouette unchanged in size and contour.  Low lung volumes. Interstitial opacities. No confluent airspace disease. No pneumothorax or pleural effusion.  No displaced fracture.  IMPRESSION: Bilateral interstitial opacities may reflect bronchitis/ bronchopneumonia, or alternatively edema.  Signed,  Dulcy Fanny. Earleen Newport, DO  [facility]  [facility]   Electronically Signed   By: Corrie Mckusick D.O.  On: 06/18/2014 12:31    Assessment/Plan Principal Problem:   Lithium toxicity Active Problems:   AIDS   Bipolar 1 disorder, mixed, moderate   Mental retardation   CKD (chronic kidney disease) stage 3, GFR 30-59 ml/min   Dehydration   ARF (acute renal failure)   Acute urinary retention   CAP (community acquired pneumonia)   Lithium toxicity -Current lithium level is 1.76. -I suspect that his lithium toxicity is related to  acute on chronic kidney disease likely due to acute dehydration and decreased oral intake and the presence of flu and pneumonia. -His restlessness and tremors can certainly be explained by lithium toxicity. -See no need for urgent hemodialysis, treat supportively with IV fluids. -Recheck lithium level in the morning.  Acute on chronic kidney disease stage III -Baseline creatinine is around 1.6-1.8. -IV fluids. -Because of his acute urinary retention will also check a renal ultrasound.  Acute urinary retention -After 2 in and out caths we have decided to place Foley catheter. -Continue Flomax, check renal ultrasound.  Community-acquired pneumonia -See no need for HCAP Coverage given patient is from an assisted living facility. -Discontinue Vancomycin/cefepime started in the emergency department and start Levaquin.  Influenza  -Diagnosed at ALF. -Continue Tamiflu which is scheduled to stop on 3/13.  HIV/AIDS -Continue antiretroviral therapy.  DVT prophylaxis  -subcutaneous heparin  CODE STATUS -Presumed full code     Time Spent on Admission: 85 minutes  [facility] Pager: [phone] 06/18/2014, 3:27 PM

## 2014-06-18 NOTE — ED Notes (Signed)
Pt continues to sleep at this time.  Arouses easily to voice.

## 2014-06-18 NOTE — ED Notes (Signed)
CRITICAL VALUE ALERT  Critical value received:  Lithium 1.76  Date of notification:  06/18/14  Time of notification:  1610  Critical value read back:Yes.    Nurse who received alert:  t Senya Hinzman rn  MD notified (1st page):  Dr.Cook Time of first page:    MD notified (2nd page):  Time of second page:  Responding MD:    Time MD responded:

## 2014-06-18 NOTE — Assessment & Plan Note (Signed)
Baseline Cr 1.6-1.8

## 2014-06-18 NOTE — ED Provider Notes (Signed)
CSN: [id number]     Arrival date & time 06/18/14  1017 History  This chart was scribed for Nat Christen, MD by Peyton Bottoms, ED Scribe. This patient was seen in room APA05/APA05 and the patient's care was started at 10:57 AM.   Chief Complaint  Patient presents with  . Abdominal Pain   The history is provided by the nursing home. No language interpreter was used.    LEVEL 5 CAVEAT: NOT RESPONDING TO QUESTIONS.  HPI Comments: Nathan [name] is a 54 y.o. male brought in from [place], with a PMHx of HIV, schizophrenia, mental retardation, anemia, anxiety, bipolar 1 disorder, depression, lithium toxicity, hypernatremia, kidney failure and anemia, who presents to the Emergency Department complaining of moderate abdominal pain. Per nursing staff, patient presents with cough, nausea, vomiting, diarrhea and fever.   Past Medical History  Diagnosis Date  . HIV (human immunodeficiency virus infection)   . Schizophrenia   . MR (mental retardation)   . Anemia   . Anxiety   . Bipolar 1 disorder   . Depression, major   . Lithium toxicity   . Hypernatremia   . Kidney failure   . Anemia    Past Surgical History  Procedure Laterality Date  . Nose surgery  1968    unsure what type of surgery. something to help him breathe better  . Colonoscopy with propofol N/A 04/12/2014    Procedure: COLONOSCOPY WITH PROPOFOL;  Surgeon: Danie Binder, MD;  Location: [facility];  Service: Endoscopy;  Laterality: N/A;  [place] time in  0925     time out    0954    total time 29 minutes  . Esophagogastroduodenoscopy (egd) with propofol N/A 04/12/2014    Procedure: ESOPHAGOGASTRODUODENOSCOPY (EGD) WITH PROPOFOL;  Surgeon: Danie Binder, MD;  Location: [facility];  Service: Endoscopy;  Laterality: N/A;  . Savory dilation N/A 04/12/2014    Procedure: SAVORY DILATION;  Surgeon: Danie Binder, MD;  Location: [facility];  Service: Endoscopy;  Laterality: N/A;  12.8/ 14/15/16  . Polypectomy N/A 04/12/2014     Procedure: POLYPECTOMY;  Surgeon: Danie Binder, MD;  Location: [facility];  Service: Endoscopy;  Laterality: N/A;  Cecal  . Esophageal biopsy N/A 04/12/2014    Procedure: BIOPSY;  Surgeon: Danie Binder, MD;  Location: [facility];  Service: Endoscopy;  Laterality: N/A;  Gastric   Family History  Problem Relation Age of Onset  . Colon cancer Neg Hx    History  Substance Use Topics  . Smoking status: Never Smoker   . Smokeless tobacco: Never Used  . Alcohol Use: No   Review of Systems  Level 5 Caveat: Not responding to questions.  Allergies  Review of patient's allergies indicates no known allergies.  Home Medications   Prior to Admission medications   Medication Sig Start Date End Date Taking? Authorizing Provider  abacavir (ZIAGEN) 300 MG tablet Take 2 tablets (600 mg total) by mouth daily. 09/07/12  Yes Truman Hayward, MD  calcitRIOL (ROCALTROL) 0.25 MCG capsule Take 0.25 mcg by mouth daily.   Yes Historical Provider, MD  clonazePAM (KLONOPIN) 2 MG tablet Take 2 mg by mouth at bedtime.   Yes Historical Provider, MD  divalproex (DEPAKOTE) 250 MG DR tablet Take 500 mg by mouth at bedtime.    Yes Historical Provider, MD  ergocalciferol (VITAMIN D2) 50000 UNITS capsule Take 50,000 Units by mouth once a week.   Yes Historical Provider, MD  ferrous sulfate (  FERROUSUL) 325 (65 FE) MG tablet Take 325 mg by mouth daily.    Yes Historical Provider, MD  gabapentin (NEURONTIN) 600 MG tablet Take 600 mg by mouth 3 (three) times daily.    Yes Historical Provider, MD  lamiVUDine (EPIVIR) 150 MG tablet Take 1 tablet (150 mg total) by mouth daily. 07/22/12  Yes Truman Hayward, MD  lisinopril (PRINIVIL,ZESTRIL) 5 MG tablet Take 5 mg by mouth daily.   Yes Historical Provider, MD  lithium carbonate (ESKALITH) 450 MG CR tablet Take 450 mg by mouth 2 (two) times daily.   Yes Historical Provider, MD  OLANZapine (ZYPREXA) 20 MG tablet Take 20 mg by mouth at bedtime.   Yes Historical Provider, MD   oseltamivir (TAMIFLU) 75 MG capsule Take 75 mg by mouth 2 (two) times daily. For 5 days(started on 06/14/14 to 06/19/14 @@8pm )   Yes Historical Provider, MD  ranitidine (ZANTAC) 150 MG tablet TAKE 1 TABLET BY MOUTH 12 HOURS AFTER TAKING REYATAZ. 10/31/12  Yes Truman Hayward, MD  REYATAZ 300 MG capsule TAKE (1) CAPSULE BY MOUTH ONCE DAILY WITH BREAKFAST.   Yes Truman Hayward, MD  ritonavir (NORVIR) 100 MG TABS Take 100 mg by mouth daily with breakfast.   Yes Historical Provider, MD  Tamsulosin HCl (FLOMAX) 0.4 MG CAPS Take 0.4 mg by mouth daily.   Yes Historical Provider, MD  traZODone (DESYREL) 100 MG tablet Take 100 mg by mouth at bedtime.   Yes Historical Provider, MD   Triage Vitals: BP 113/66 mmHg  Pulse 59  Resp 11  SpO2 96%  Physical Exam  Constitutional: He appears well-developed and well-nourished.  Looks dehydrated.  HENT:  Head: Normocephalic and atraumatic.  Lips are dry. Not specifically answering questions. Mumbling   Eyes: Conjunctivae and EOM are normal. Pupils are equal, round, and reactive to light.  Neck: Normal range of motion. Neck supple.  Cardiovascular: Normal rate and regular rhythm.   Pulmonary/Chest: Effort normal and breath sounds normal.  Abdominal: Soft. Bowel sounds are normal.  Musculoskeletal: Normal range of motion.  Neurological:  Unable.  Skin: Skin is warm and dry.  Psychiatric: He has a normal mood and affect. His behavior is normal.  Nursing note and vitals reviewed.  ED Course  Procedures (including critical care time)  DIAGNOSTIC STUDIES: Oxygen Saturation is 96% on RA, normal by my interpretation.    COORDINATION OF CARE: 11:03 AM- Discussed plans to depakote and lithium levels. Ordered diagnostic lab work and EKG. Pt advised of plan for treatment and pt agrees.  Results for orders placed or performed during the hospital encounter of 06/18/14  CBC with Differential/Platelet  Result Value Ref Range   WBC 8.4 4.0 - 10.5 K/uL    RBC 3.93 (L) 4.22 - 5.81 MIL/uL   Hemoglobin 11.7 (L) 13.0 - 17.0 g/dL   HCT 37.1 (L) 39.0 - 52.0 %   MCV 94.4 78.0 - 100.0 fL   MCH 29.8 26.0 - 34.0 pg   MCHC 31.5 30.0 - 36.0 g/dL   RDW 13.3 11.5 - 15.5 %   Platelets 183 150 - 400 K/uL   Neutrophils Relative % 72 43 - 77 %   Neutro Abs 6.0 1.7 - 7.7 K/uL   Lymphocytes Relative 19 12 - 46 %   Lymphs Abs 1.6 0.7 - 4.0 K/uL   Monocytes Relative 6 3 - 12 %   Monocytes Absolute 0.5 0.1 - 1.0 K/uL   Eosinophils Relative 3 0 - 5 %  Eosinophils Absolute 0.3 0.0 - 0.7 K/uL   Basophils Relative 0 0 - 1 %   Basophils Absolute 0.0 0.0 - 0.1 K/uL  Comprehensive metabolic panel  Result Value Ref Range   Sodium 142 135 - 145 mmol/L   Potassium 4.3 3.5 - 5.1 mmol/L   Chloride 108 96 - 112 mmol/L   CO2 30 19 - 32 mmol/L   Glucose, Bld 139 (H) 70 - 99 mg/dL   BUN 21 6 - 23 mg/dL   Creatinine, Ser 2.23 (H) 0.50 - 1.35 mg/dL   Calcium 10.0 8.4 - 10.5 mg/dL   Total Protein 8.8 (H) 6.0 - 8.3 g/dL   Albumin 4.3 3.5 - 5.2 g/dL   AST 27 0 - 37 U/L   ALT 41 0 - 53 U/L   Alkaline Phosphatase 53 39 - 117 U/L   Total Bilirubin 1.4 (H) 0.3 - 1.2 mg/dL   GFR calc non Af Amer 31 (L) >90 mL/min   GFR calc Af Amer 36 (L) >90 mL/min   Anion gap 4 (L) 5 - 15  Urinalysis, Routine w reflex microscopic  Result Value Ref Range   Color, Urine YELLOW YELLOW   APPearance CLEAR CLEAR   Specific Gravity, Urine <1.005 (L) 1.005 - 1.030   pH 6.5 5.0 - 8.0   Glucose, UA NEGATIVE NEGATIVE mg/dL   Hgb urine dipstick TRACE (A) NEGATIVE   Bilirubin Urine NEGATIVE NEGATIVE   Ketones, ur NEGATIVE NEGATIVE mg/dL   Protein, ur NEGATIVE NEGATIVE mg/dL   Urobilinogen, UA 0.2 0.0 - 1.0 mg/dL   Nitrite NEGATIVE NEGATIVE   Leukocytes, UA NEGATIVE NEGATIVE  Lithium level  Result Value Ref Range   Lithium Lvl 1.76 (HH) 0.80 - 1.40 mmol/L  Valproic acid level  Result Value Ref Range   Valproic Acid Lvl 20.5 (L) 50.0 - 100.0 ug/mL  Urine microscopic-add on  Result Value  Ref Range   Squamous Epithelial / LPF RARE RARE   RBC / HPF 0-2 <3 RBC/hpf   Bacteria, UA RARE RARE   Dg Chest Port 1 View  06/18/2014   CLINICAL DATA:  54 year old male with a history of altered mental status. Abdominal pain.  EXAM: PORTABLE CHEST - 1 VIEW  COMPARISON:  05/05/2011  FINDINGS: Cardiomediastinal silhouette unchanged in size and contour.  Low lung volumes. Interstitial opacities. No confluent airspace disease. No pneumothorax or pleural effusion.  No displaced fracture.  IMPRESSION: Bilateral interstitial opacities may reflect bronchitis/ bronchopneumonia, or alternatively edema.  Signed,  Dulcy Fanny. Earleen Newport, DO  [facility]  [facility]   Electronically Signed   By: Corrie Mckusick D.O.   On: 06/18/2014 12:31   Labs Review Labs Reviewed  CBC WITH DIFFERENTIAL/PLATELET - Abnormal; Notable for the following:    RBC 3.93 (*)    Hemoglobin 11.7 (*)    HCT 37.1 (*)    All other components within normal limits  COMPREHENSIVE METABOLIC PANEL - Abnormal; Notable for the following:    Glucose, Bld 139 (*)    Creatinine, Ser 2.23 (*)    Total Protein 8.8 (*)    Total Bilirubin 1.4 (*)    GFR calc non Af Amer 31 (*)    GFR calc Af Amer 36 (*)    Anion gap 4 (*)    All other components within normal limits  URINALYSIS, ROUTINE W REFLEX MICROSCOPIC - Abnormal; Notable for the following:    Specific Gravity, Urine <1.005 (*)    Hgb urine dipstick TRACE (*)  All other components within normal limits  LITHIUM LEVEL - Abnormal; Notable for the following:    Lithium Lvl 1.76 (*)    All other components within normal limits  VALPROIC ACID LEVEL - Abnormal; Notable for the following:    Valproic Acid Lvl 20.5 (*)    All other components within normal limits  URINE MICROSCOPIC-ADD ON   Imaging Review Dg Chest Port 1 View  06/18/2014   CLINICAL DATA:  54 year old male with a history of altered mental status. Abdominal pain.  EXAM: PORTABLE  CHEST - 1 VIEW  COMPARISON:  05/05/2011  FINDINGS: Cardiomediastinal silhouette unchanged in size and contour.  Low lung volumes. Interstitial opacities. No confluent airspace disease. No pneumothorax or pleural effusion.  No displaced fracture.  IMPRESSION: Bilateral interstitial opacities may reflect bronchitis/ bronchopneumonia, or alternatively edema.  Signed,  Dulcy Fanny. Earleen Newport, DO  [facility]  [facility]   Electronically Signed   By: Corrie Mckusick D.O.   On: 06/18/2014 12:31    EKG Interpretation   Date/Time:  Saturday June 18 2014 10:39:49 EST Ventricular Rate:  57 PR Interval:  235 QRS Duration: 119 QT Interval:  401 QTC Calculation: 390 R Axis:   72 Text Interpretation:  Sinus rhythm Prolonged PR interval Incomplete left  bundle branch block Left ventricular hypertrophy Confirmed by Lacinda Axon  MD,  Herma Uballe ([id number]) on 06/18/2014 10:49:58 AM     CRITICAL CARE Performed by: Nat Christen  ?  Total critical care time: 30  Critical care time was exclusive of separately billable procedures and treating other patients.  Critical care was necessary to treat or prevent imminent or life-threatening deterioration.  Critical care was time spent personally by me on the following activities: development of treatment plan with patient and/or surrogate as well as nursing, discussions with consultants, evaluation of patient's response to treatment, examination of patient, obtaining history from patient or surrogate, ordering and performing treatments and interventions, ordering and review of laboratory studies, ordering and review of radiographic studies, pulse oximetry and re-evaluation of patient's condition.  MDM   Final diagnoses:  Altered mental state  Dehydration  Healthcare-associated pneumonia    Chest x-ray suggests bronchitis versus bronchopneumonia. Patient appears dehydrated. Lithium level elevated.  Will hydrate, IV vancomycin, IV  Maxipime. Admit  I personally performed the services described in this documentation, which was scribed in my presence. The recorded information has been reviewed and is accurate.   Nat Christen, MD 06/18/14 [phone]

## 2014-06-19 ENCOUNTER — Inpatient Hospital Stay (HOSPITAL_COMMUNITY): Payer: Medicare Other

## 2014-06-19 DIAGNOSIS — E860 Dehydration: Secondary | ICD-10-CM

## 2014-06-19 DIAGNOSIS — T56891D Toxic effect of other metals, accidental (unintentional), subsequent encounter: Secondary | ICD-10-CM

## 2014-06-19 LAB — CBC
HCT: 37.5 % — ABNORMAL LOW (ref 39.0–52.0)
HEMOGLOBIN: 11.5 g/dL — AB (ref 13.0–17.0)
MCH: 29.2 pg (ref 26.0–34.0)
MCHC: 30.7 g/dL (ref 30.0–36.0)
MCV: 95.2 fL (ref 78.0–100.0)
Platelets: 191 10*3/uL (ref 150–400)
RBC: 3.94 MIL/uL — ABNORMAL LOW (ref 4.22–5.81)
RDW: 13.6 % (ref 11.5–15.5)
WBC: 10.4 10*3/uL (ref 4.0–10.5)

## 2014-06-19 LAB — BASIC METABOLIC PANEL
ANION GAP: 5 (ref 5–15)
BUN: 19 mg/dL (ref 6–23)
CALCIUM: 9.7 mg/dL (ref 8.4–10.5)
CHLORIDE: 115 mmol/L — AB (ref 96–112)
CO2: 27 mmol/L (ref 19–32)
Creatinine, Ser: 1.99 mg/dL — ABNORMAL HIGH (ref 0.50–1.35)
GFR calc non Af Amer: 36 mL/min — ABNORMAL LOW (ref >90)
GFR, EST AFRICAN AMERICAN: 42 mL/min — AB (ref >90)
Glucose, Bld: 115 mg/dL — ABNORMAL HIGH (ref 70–99)
POTASSIUM: 4.9 mmol/L (ref 3.5–5.1)
Sodium: 147 mmol/L — ABNORMAL HIGH (ref 135–145)

## 2014-06-19 LAB — LITHIUM LEVEL: Lithium Lvl: 1.16 mmol/L (ref 0.80–1.40)

## 2014-06-19 MED ORDER — GABAPENTIN 300 MG PO CAPS
600.0000 mg | ORAL_CAPSULE | Freq: Two times a day (BID) | ORAL | Status: DC
  Administered 2014-06-22 – 2014-06-25 (×6): 600 mg via ORAL
  Filled 2014-06-19 (×5): qty 2
  Filled 2014-06-19: qty 6
  Filled 2014-06-19 (×4): qty 2

## 2014-06-19 NOTE — Progress Notes (Signed)
Patients restraint order renewed at 1600 today, patient still attempting to get OOB and pull at lines.  Patient remains non verbal. Has not eaten any meals.  Attempted to feed again this evening, did not turn head as he did earlier but when food placed in mouth, patient does not follow commands to chew and swallow, just hold food in his mouth.  Tried a few sips of tea, but patient began coughing and appeared to get strangled.  Bed alarms on for safety.  Will continue to monitor.

## 2014-06-19 NOTE — Progress Notes (Signed)
Patient resting well until approximately 10 AM this morning.  Patient now awake and seems that he can't get comfortable; turning in bed frequently. Attempted to reposition, but continues to turn over, etc.Patient mute, will not speak or follow commands at this time.  Attempted to feed and give PO medications, but patient turns head and will not open mouth.  Would only take a few sips of water to start.  Patient attempted to get OOB without assist while I stepped out of room to get heparin, returned to lying position with out combativeness and was able to get positioned in bed appropriately.  Patient still has L soft wrist restraint in place, restraint check WNL.  IV and foley remain in place.  Patients bed alarm on for safety.

## 2014-06-19 NOTE — Progress Notes (Signed)
[facility] PROGRESS NOTE  Nathan [name] TTS:[id number] DOB: 01/07/1961 DOA: 06/18/2014 PCP: Shawn Stall, NP  Assessment/Plan: Lithium Toxicity -Levels down to normal with hydration. -Suspect secondary to ARF with decreased PO intake given recent flu. -Continue IVF for now.  Acute on CKD Stage III -Baseline cr 1.6-1.8. -Renal US negative. -Cr down to 1.99 which is close to his baseline. -Continue IVF.  Acute Urinary Retention -Keep foley today. -Attempt voiding trial once more alert.  CAP -Continue levaquin.  Influenza -Completes 5 days of tamiflu 3/13.  HIV/AIDS -Continue HAART.  Acute Encephalopathy -Presumed 2/2 CAP/lithium toxicity on top of baseline psychiatric disorders. -Not quite sure of his baseline.  Code Status: Full Code Family Communication: Patient only  Disposition Plan: Back to ALF once ready   Consultants:  None   Antibiotics:  Levaquin   Subjective: Restless in bed, Left soft wrist restraint on.  Objective: Filed Vitals:   06/18/14 1430 06/18/14 1435 06/18/14 1553 06/19/14 0900  BP: 104/50 112/52 122/51 113/58  Pulse:  68 71 74  Temp:   99.2 F (37.3 C) 98.5 F (36.9 C)  TempSrc:   Oral Axillary  Resp:  16 20 18   Height:  5' 8.9" (1.75 m)    Weight:  94.3 kg (207 lb 14.3 oz)    SpO2:  96% 99% 95%    Intake/Output Summary (Last 24 hours) at 06/19/14 1400 Last data filed at 06/19/14 1245  Gross per 24 hour  Intake   3405 ml  Output   1625 ml  Net   1780 ml   Filed Weights   06/18/14 1435  Weight: 94.3 kg (207 lb 14.3 oz)    Exam:   General:  Awake, not oriented  Cardiovascular: RRR  Respiratory: CTA B  Abdomen: S/NT/ND/+BS  Extremities: no C/C/E   Neurologic:  Moves all 4 spontaneously  Data Reviewed: Basic Metabolic Panel:  Recent Labs Lab 06/18/14 1121 06/19/14 0601  NA 142 147*  K 4.3 4.9  CL 108 115*  CO2 30 27  GLUCOSE 139* 115*  BUN 21 19  CREATININE 2.23* 1.99*    CALCIUM 10.0 9.7   Liver Function Tests:  Recent Labs Lab 06/18/14 1121  AST 27  ALT 41  ALKPHOS 53  BILITOT 1.4*  PROT 8.8*  ALBUMIN 4.3   No results for input(s): LIPASE, AMYLASE in the last 168 hours. No results for input(s): AMMONIA in the last 168 hours. CBC:  Recent Labs Lab 06/18/14 1121 06/19/14 0601  WBC 8.4 10.4  NEUTROABS 6.0  --   HGB 11.7* 11.5*  HCT 37.1* 37.5*  MCV 94.4 95.2  PLT 183 191   Cardiac Enzymes: No results for input(s): CKTOTAL, CKMB, CKMBINDEX, TROPONINI in the last 168 hours. BNP (last 3 results) No results for input(s): BNP in the last 8760 hours.  ProBNP (last 3 results) No results for input(s): PROBNP in the last 8760 hours.  CBG: No results for input(s): GLUCAP in the last 168 hours.  No results found for this or any previous visit (from the past 240 hour(s)).   Studies: US Renal  06/19/2014   CLINICAL DATA:  Acute renal failure, stage III chronic kidney disease, urinary retention, history HIV  EXAM: RENAL/URINARY TRACT ULTRASOUND COMPLETE  COMPARISON:  10/23/2011  FINDINGS: Exam limited by patient condition and inability to cooperate.  Right Kidney:  Length: 11.5 cm. Normal morphology without mass or hydronephrosis. No shadowing calcifications.  Left Kidney:  Length: 11.3 cm. Normal morphology without mass  or hydronephrosis. No shadowing calcifications.  Bladder:  Decompressed by Foley catheter, not assessed.  IMPRESSION: No renal sonographic abnormalities identified.   Electronically Signed   By: Lavonia Dana M.D.   On: 06/19/2014 10:10   Dg Chest Port 1 View  06/18/2014   CLINICAL DATA:  54 year old male with a history of altered mental status. Abdominal pain.  EXAM: PORTABLE CHEST - 1 VIEW  COMPARISON:  05/05/2011  FINDINGS: Cardiomediastinal silhouette unchanged in size and contour.  Low lung volumes. Interstitial opacities. No confluent airspace disease. No pneumothorax or pleural effusion.  No displaced fracture.  IMPRESSION:  Bilateral interstitial opacities may reflect bronchitis/ bronchopneumonia, or alternatively edema.  Signed,  Dulcy Fanny. Earleen Newport, DO  [facility]  [facility]   Electronically Signed   By: Corrie Mckusick D.O.   On: 06/18/2014 12:31    Scheduled Meds: . abacavir  600 mg Oral Daily  . antiseptic oral rinse  7 mL Mouth Rinse BID  . atazanavir  300 mg Oral Q breakfast  . calcitRIOL  0.25 mcg Oral Daily  . clonazePAM  2 mg Oral QHS  . divalproex  500 mg Oral QHS  . famotidine  20 mg Oral Q24H  . ferrous sulfate  325 mg Oral Daily  . gabapentin  600 mg Oral BID  . heparin  5,000 Units Subcutaneous 3 times per day  . lamiVUDine  150 mg Oral Daily  . [START ON 06/20/2014] levofloxacin (LEVAQUIN) IV  750 mg Intravenous Q48H  . OLANZapine  20 mg Oral QHS  . oseltamivir  30 mg Oral BID  . ritonavir  100 mg Oral Q breakfast  . sodium chloride  3 mL Intravenous Q12H  . tamsulosin  0.4 mg Oral Daily  . traZODone  100 mg Oral QHS   Continuous Infusions: . sodium chloride 125 mL/hr at 06/19/14 3474    Principal Problem:   Lithium toxicity Active Problems:   AIDS   Bipolar 1 disorder, mixed, moderate   Mental retardation   CKD (chronic kidney disease) stage 3, GFR 30-59 ml/min   Dehydration   ARF (acute renal failure)   Acute urinary retention   CAP (community acquired pneumonia)    Time spent: 25 minutes. Greater than 50% of this time was spent in direct contact with the patient coordinating care.    Lelon Frohlich  [facility] Pager [phone]  If 7PM-7AM, please contact night-coverage at [email or web address], password [facility] 06/19/2014, 2:00 PM  LOS: 1 day

## 2014-06-20 ENCOUNTER — Inpatient Hospital Stay (HOSPITAL_COMMUNITY): Payer: Medicare Other

## 2014-06-20 DIAGNOSIS — E870 Hyperosmolality and hypernatremia: Secondary | ICD-10-CM

## 2014-06-20 DIAGNOSIS — F79 Unspecified intellectual disabilities: Secondary | ICD-10-CM

## 2014-06-20 LAB — BASIC METABOLIC PANEL
Anion gap: 6 (ref 5–15)
BUN: 18 mg/dL (ref 6–23)
CHLORIDE: 120 mmol/L — AB (ref 96–112)
CO2: 25 mmol/L (ref 19–32)
Calcium: 9.7 mg/dL (ref 8.4–10.5)
Creatinine, Ser: 1.97 mg/dL — ABNORMAL HIGH (ref 0.50–1.35)
GFR calc Af Amer: 42 mL/min — ABNORMAL LOW (ref >90)
GFR calc non Af Amer: 36 mL/min — ABNORMAL LOW (ref >90)
Glucose, Bld: 126 mg/dL — ABNORMAL HIGH (ref 70–99)
POTASSIUM: 4 mmol/L (ref 3.5–5.1)
Sodium: 151 mmol/L — ABNORMAL HIGH (ref 135–145)

## 2014-06-20 LAB — URINALYSIS, ROUTINE W REFLEX MICROSCOPIC
Bilirubin Urine: NEGATIVE
Glucose, UA: NEGATIVE mg/dL
KETONES UR: NEGATIVE mg/dL
NITRITE: NEGATIVE
PH: 6 (ref 5.0–8.0)
Protein, ur: 100 mg/dL — AB
Specific Gravity, Urine: 1.015 (ref 1.005–1.030)
Urobilinogen, UA: 0.2 mg/dL (ref 0.0–1.0)

## 2014-06-20 LAB — URINE MICROSCOPIC-ADD ON

## 2014-06-20 LAB — CBC
HCT: 38.2 % — ABNORMAL LOW (ref 39.0–52.0)
HEMOGLOBIN: 11.6 g/dL — AB (ref 13.0–17.0)
MCH: 29.1 pg (ref 26.0–34.0)
MCHC: 30.4 g/dL (ref 30.0–36.0)
MCV: 95.7 fL (ref 78.0–100.0)
PLATELETS: 223 10*3/uL (ref 150–400)
RBC: 3.99 MIL/uL — ABNORMAL LOW (ref 4.22–5.81)
RDW: 13.8 % (ref 11.5–15.5)
WBC: 10.9 10*3/uL — AB (ref 4.0–10.5)

## 2014-06-20 MED ORDER — DEXTROSE 5 % IV SOLN
INTRAVENOUS | Status: DC
  Administered 2014-06-20 – 2014-06-21 (×2): via INTRAVENOUS
  Administered 2014-06-21: 1 mL via INTRAVENOUS
  Administered 2014-06-22: 21:00:00 via INTRAVENOUS
  Administered 2014-06-22: 1 mL via INTRAVENOUS
  Administered 2014-06-23 (×2): via INTRAVENOUS
  Administered 2014-06-24: 500 mL via INTRAVENOUS
  Administered 2014-06-24: 11:00:00 via INTRAVENOUS

## 2014-06-20 NOTE — Progress Notes (Signed)
NT called this nurse to notify of axillary temp of 102.2*.  Notified Dr Jerilee Hoh, orders obtained for UA, Blood Culture and CXR.  Urine sent to lab, lab came to draw cultures, and CXR completed.  Tylenol suppository administered. Will continue to monitor.

## 2014-06-20 NOTE — Clinical Social Work Psychosocial (Signed)
Clinical Social Work Department BRIEF PSYCHOSOCIAL ASSESSMENT 06/20/2014  Patient:  Nathan [name], Nathan [name]     Account Number:  [id number]     [place] date:  06/18/2014  Clinical Social Worker:  Wyatt Haste  Date/Time:  06/20/2014 01:04 PM  Referred by:  CSW  Date Referred:  06/20/2014 Referred for  ALF Placement   Other Referral:   Interview type:  Family Other interview type:   uncle- Donnal Debar    PSYCHOSOCIAL DATA Living Status:  FACILITY Admitted from facility:  [facility] Level of care:  Assisted Living Primary support name:  Gwyndolyn Saxon Primary support relationship to patient:  FAMILY Degree of support available:   very limited    CURRENT CONCERNS Current Concerns  Post-Acute Placement   Other Concerns:    SOCIAL WORK ASSESSMENT / PLAN CSW spoke with Bethena Roys at [facility] regarding pt as he is very disoriented. Bethena Roys reports at baseline, pt is oriented to self and place at least. He generally makes his own decisions. They have an uncle, Donnal Debar ([phone]) listed as his contact. Pt tested positive for flu at [facility] and had been started on Tamiflu. Facility sent him to ED when he was not acting like himself. Admitted with lithium toxicity and community acquired pneumonia. Pt has been a resident at [facility] since October 19, 2008. He is generally fairly independent, and they assist minimally with bathing and dressing. Pt ambulates, but Bethena Roys reports that he drags his feet and looks like he will topple over. They frequently remind him to slow down. Bethena Roys indicates pt has diagnoses of MR, Bipolar, and Schizophrenia. He is regularly seen at [facility]. Bethena Roys is not aware of pt's IQ. Pt is generally very cooperative and communicates fairly well, but does have some slurred speech. Pt is okay to return at d/c. CSW spoke with Gwyndolyn Saxon who reports he has been very sick recently and is "very old" so has not seen pt in some time. Gwyndolyn Saxon said that pt's mother lives  somewhere in [place], but he does not have her number. CSW confirmed with [facility] that they do not have contact information for her either. Gwyndolyn Saxon is agreeable to return to [facility].   Assessment/plan status:  Psychosocial Support/Ongoing Assessment of Needs Other assessment/ plan:   Information/referral to community resources:   [facility]    PATIENT'S/FAMILY'S RESPONSE TO PLAN OF CARE: Pt is unable to participate in assessment due to mental status. Pt is not at baseline based on facility's report. MD notified.       Nathan [name], [place]

## 2014-06-20 NOTE — Care Management Note (Addendum)
    Page 1 of 1   06/24/2014     12:50:26 PM CARE MANAGEMENT NOTE 06/24/2014  Patient:  Nathan [name], Nathan [name]   Account Number:  [id number]  Date Initiated:  06/20/2014  Documentation initiated by:  Theophilus Kinds  Subjective/Objective Assessment:   Pt admitted from [facility] with lithium toxicity and flu. Anticipate discharge back to facility.     Action/Plan:   CSW is aware and will arrange discharge back to facility when medically stable.   Anticipated DC Date:  06/22/2014   Anticipated DC Plan:  ASSISTED LIVING / REST HOME  In-house referral  Clinical Social Worker      DC Planning Services  CM consult      Choice offered to / List presented to:             Status of service:  Completed, signed off Medicare Important Message given?  YES (If response is "NO", the following Medicare IM given date fields will be blank) Date Medicare IM given:  06/24/2014 Medicare IM given by:  Theophilus Kinds Date Additional Medicare IM given:   Additional Medicare IM given by:    Discharge Disposition:  ASSISTED LIVING  Per UR Regulation:    If discussed at Long Length of Stay Meetings, dates discussed:    Comments:  06/24/14 [place], RN BSN CM Anticipate discharge back to [facility] within 24 hours. CSW to arrange discharge once written.  06/20/14 [place], RN BSN CM

## 2014-06-20 NOTE — Progress Notes (Signed)
UR chart review completed.  

## 2014-06-20 NOTE — Progress Notes (Addendum)
[facility] PROGRESS NOTE  Nathan [name] TDH:[id number] DOB: 07-23-1960 DOA: 06/18/2014 PCP: Shawn Stall, NP  Assessment/Plan: Lithium Toxicity -Levels down to normal with hydration. -Suspect secondary to ARF with decreased PO intake given recent flu. -Continue IVF for now. -Will ask psychiatry to assist with alternate management of his bipolar disorder as I believe he should not be placed back on lithium given his CKD.  Acute on CKD Stage III -Baseline cr 1.6-1.8. -Renal US negative. -Cr down to 1.97 which is close to his baseline. -Continue IVF.  Hypernatremia -likely related to copious amounts of saline provided. -Hope is not developing DI. -Change IVF to D5 and follow sodium closely.  Acute Urinary Retention -Keep foley today. -Attempt voiding trial once more alert. -May need to DC with foley and have him f/u with GU as an OP.  ?CAP/UTI -Continue levaquin. -Spiked temp: blood cx/CXR (NAD) and repeat UA.  Influenza -Completed 5 days of tamiflu 3/13.  HIV/AIDS -Continue HAART.  Acute Encephalopathy -Presumed 2/2 CAP/UTI/hypernatremia/lithium toxicity on top of baseline psychiatric disorders. -Far off from his baseline according to ALF.   Code Status: Full Code Family Communication: Patient only  Disposition Plan: Back to ALF once ready   Consultants:  None   Antibiotics:  Levaquin   Subjective: Restless in bed, Wrist restraints on.  Objective: Filed Vitals:   06/19/14 2038 06/20/14 0628 06/20/14 1452 06/20/14 1635  BP: 155/68 126/71 129/73   Pulse: 89 79 96   Temp: 98.8 F (37.1 C) 99.1 F (37.3 C) 102.2 F (39 C) 100.9 F (38.3 C)  TempSrc: Axillary Axillary Axillary Axillary  Resp: 20 21 22    Height:      Weight:      SpO2: 94% 95% 93%     Intake/Output Summary (Last 24 hours) at 06/20/14 1749 Last data filed at 06/20/14 1300  Gross per 24 hour  Intake      3 ml  Output   3225 ml  Net  -3222 ml   Filed  Weights   06/18/14 1435  Weight: 94.3 kg (207 lb 14.3 oz)    Exam:   General:  Awake, not oriented  Cardiovascular: RRR  Respiratory: CTA B  Abdomen: S/NT/ND/+BS  Extremities: no C/C/E   Neurologic:  Moves all 4 spontaneously  Data Reviewed: Basic Metabolic Panel:  Recent Labs Lab 06/18/14 1121 06/19/14 0601 06/20/14 0620  NA 142 147* 151*  K 4.3 4.9 4.0  CL 108 115* 120*  CO2 30 27 25   GLUCOSE 139* 115* 126*  BUN 21 19 18   CREATININE 2.23* 1.99* 1.97*  CALCIUM 10.0 9.7 9.7   Liver Function Tests:  Recent Labs Lab 06/18/14 1121  AST 27  ALT 41  ALKPHOS 53  BILITOT 1.4*  PROT 8.8*  ALBUMIN 4.3   No results for input(s): LIPASE, AMYLASE in the last 168 hours. No results for input(s): AMMONIA in the last 168 hours. CBC:  Recent Labs Lab 06/18/14 1121 06/19/14 0601 06/20/14 0620  WBC 8.4 10.4 10.9*  NEUTROABS 6.0  --   --   HGB 11.7* 11.5* 11.6*  HCT 37.1* 37.5* 38.2*  MCV 94.4 95.2 95.7  PLT 183 191 223   Cardiac Enzymes: No results for input(s): CKTOTAL, CKMB, CKMBINDEX, TROPONINI in the last 168 hours. BNP (last 3 results) No results for input(s): BNP in the last 8760 hours.  ProBNP (last 3 results) No results for input(s): PROBNP in the last 8760 hours.  CBG: No results for input(s): GLUCAP  in the last 168 hours.  Recent Results (from the past 240 hour(s))  Culture, blood (single)     Status: None (Preliminary result)   Collection Time: 06/20/14  3:17 PM  Result Value Ref Range Status   Specimen Description BLOOD RIGHT ANTECUBITAL  Final   Special Requests BOTTLES DRAWN AEROBIC AND ANAEROBIC 8CC  Final   Culture PENDING  Incomplete   Report Status PENDING  Incomplete     Studies: US Renal  06/19/2014   CLINICAL DATA:  Acute renal failure, stage III chronic kidney disease, urinary retention, history HIV  EXAM: RENAL/URINARY TRACT ULTRASOUND COMPLETE  COMPARISON:  10/23/2011  FINDINGS: Exam limited by patient condition and  inability to cooperate.  Right Kidney:  Length: 11.5 cm. Normal morphology without mass or hydronephrosis. No shadowing calcifications.  Left Kidney:  Length: 11.3 cm. Normal morphology without mass or hydronephrosis. No shadowing calcifications.  Bladder:  Decompressed by Foley catheter, not assessed.  IMPRESSION: No renal sonographic abnormalities identified.   Electronically Signed   By: Lavonia Dana M.D.   On: 06/19/2014 10:10   Dg Chest Port 1 View  06/20/2014   CLINICAL DATA:  Fevers  EXAM: PORTABLE CHEST - 1 VIEW  COMPARISON:  06/18/2014  FINDINGS: Cardiac shadow is within normal limits. The lungs are well aerated bilaterally. No acute bony abnormality is noted.  IMPRESSION: No active disease.   Electronically Signed   By: Inez Catalina M.D.   On: 06/20/2014 15:32    Scheduled Meds: . abacavir  600 mg Oral Daily  . antiseptic oral rinse  7 mL Mouth Rinse BID  . atazanavir  300 mg Oral Q breakfast  . calcitRIOL  0.25 mcg Oral Daily  . clonazePAM  2 mg Oral QHS  . divalproex  500 mg Oral QHS  . famotidine  20 mg Oral Q24H  . ferrous sulfate  325 mg Oral Daily  . gabapentin  600 mg Oral BID  . heparin  5,000 Units Subcutaneous 3 times per day  . lamiVUDine  150 mg Oral Daily  . levofloxacin (LEVAQUIN) IV  750 mg Intravenous Q48H  . OLANZapine  20 mg Oral QHS  . ritonavir  100 mg Oral Q breakfast  . sodium chloride  3 mL Intravenous Q12H  . tamsulosin  0.4 mg Oral Daily  . traZODone  100 mg Oral QHS   Continuous Infusions: . sodium chloride 125 mL/hr at 06/19/14 1559    Principal Problem:   Lithium toxicity Active Problems:   AIDS   Bipolar 1 disorder, mixed, moderate   Mental retardation   CKD (chronic kidney disease) stage 3, GFR 30-59 ml/min   Dehydration   ARF (acute renal failure)   Acute urinary retention   CAP (community acquired pneumonia)    Time spent: 25 minutes. Greater than 50% of this time was spent in direct contact with the patient coordinating  care.    Lelon Frohlich  [facility] Pager [phone]  If 7PM-7AM, please contact night-coverage at [email or web address], password [facility] 06/20/2014, 5:49 PM  LOS: 2 days

## 2014-06-21 DIAGNOSIS — E870 Hyperosmolality and hypernatremia: Secondary | ICD-10-CM

## 2014-06-21 LAB — BASIC METABOLIC PANEL
Anion gap: 9 (ref 5–15)
BUN: 26 mg/dL — AB (ref 6–23)
CALCIUM: 9.9 mg/dL (ref 8.4–10.5)
CO2: 24 mmol/L (ref 19–32)
CREATININE: 2.53 mg/dL — AB (ref 0.50–1.35)
Chloride: 120 mmol/L — ABNORMAL HIGH (ref 96–112)
GFR, EST AFRICAN AMERICAN: 31 mL/min — AB (ref >90)
GFR, EST NON AFRICAN AMERICAN: 27 mL/min — AB (ref >90)
GLUCOSE: 152 mg/dL — AB (ref 70–99)
Potassium: 3.9 mmol/L (ref 3.5–5.1)
Sodium: 153 mmol/L — ABNORMAL HIGH (ref 135–145)

## 2014-06-21 LAB — URINE CULTURE
COLONY COUNT: NO GROWTH
CULTURE: NO GROWTH

## 2014-06-21 LAB — CBC
HCT: 43.2 % (ref 39.0–52.0)
Hemoglobin: 13.3 g/dL (ref 13.0–17.0)
MCH: 29.3 pg (ref 26.0–34.0)
MCHC: 30.8 g/dL (ref 30.0–36.0)
MCV: 95.2 fL (ref 78.0–100.0)
Platelets: 249 10*3/uL (ref 150–400)
RBC: 4.54 MIL/uL (ref 4.22–5.81)
RDW: 13.8 % (ref 11.5–15.5)
WBC: 13.5 10*3/uL — AB (ref 4.0–10.5)

## 2014-06-21 NOTE — Consult Note (Addendum)
Nathan [name] MRN: [id number] DOB/AGE: [birth year] 56 y.o. Primary Care Physician:Gusler, Christin Z, NP Admit date: 06/18/2014 Chief Complaint:  Chief Complaint  Patient presents with  . Abdominal Pain   HPI: Pt is 54 year old male with past medical hx of HIVm schizophrenia who was brought to ER with c/o AMS History from medical records HPI dates back to 06/18/14 when pt was brought to ER with c/o lethargy. Pt was found to urinary retention and AKI and was admitted. Pt was also found to have high lithium levels. Pt is shouting/ screaming on seeing me and offers no history  Past Medical History  Diagnosis Date  . HIV (human immunodeficiency virus infection)   . Schizophrenia   . MR (mental retardation)   . Anemia   . Anxiety   . Bipolar 1 disorder   . Depression, major   . Lithium toxicity   . Hypernatremia   . Kidney failure   . Anemia        Family History  Problem Relation Age of Onset  . Colon cancer Neg Hx     Social History:  reports that he has never smoked. He has never used smokeless tobacco. He reports that he does not drink alcohol or use illicit drugs.   Allergies: No Known Allergies  Medications Prior to Admission  Medication Sig Dispense Refill  . abacavir (ZIAGEN) 300 MG tablet Take 2 tablets (600 mg total) by mouth daily. 60 tablet 11  . calcitRIOL (ROCALTROL) 0.25 MCG capsule Take 0.25 mcg by mouth daily.    . clonazePAM (KLONOPIN) 2 MG tablet Take 2 mg by mouth at bedtime.    . divalproex (DEPAKOTE) 250 MG DR tablet Take 500 mg by mouth at bedtime.     . ergocalciferol (VITAMIN D2) 50000 UNITS capsule Take 50,000 Units by mouth once a week.    . ferrous sulfate (FERROUSUL) 325 (65 FE) MG tablet Take 325 mg by mouth daily.     Marland Kitchen gabapentin (NEURONTIN) 600 MG tablet Take 600 mg by mouth 3 (three) times daily.     Marland Kitchen lamiVUDine (EPIVIR) 150 MG tablet Take 1 tablet (150 mg total) by mouth daily. 30 tablet 11  . lisinopril (PRINIVIL,ZESTRIL) 5 MG  tablet Take 5 mg by mouth daily.    Marland Kitchen lithium carbonate (ESKALITH) 450 MG CR tablet Take 450 mg by mouth 2 (two) times daily.    Marland Kitchen OLANZapine (ZYPREXA) 20 MG tablet Take 20 mg by mouth at bedtime.    Marland Kitchen oseltamivir (TAMIFLU) 75 MG capsule Take 75 mg by mouth 2 (two) times daily. For 5 days(started on 06/14/14 to 06/19/14 @@8pm )    . ranitidine (ZANTAC) 150 MG tablet TAKE 1 TABLET BY MOUTH 12 HOURS AFTER TAKING REYATAZ. 30 tablet 3  . REYATAZ 300 MG capsule TAKE (1) CAPSULE BY MOUTH ONCE DAILY WITH BREAKFAST. 30 capsule 3  . ritonavir (NORVIR) 100 MG TABS Take 100 mg by mouth daily with breakfast.    . Tamsulosin HCl (FLOMAX) 0.4 MG CAPS Take 0.4 mg by mouth daily.    . traZODone (DESYREL) 100 MG tablet Take 100 mg by mouth at bedtime.         NWG:NFAOZH to review any ROS as pt not communicative    . abacavir  600 mg Oral Daily  . antiseptic oral rinse  7 mL Mouth Rinse BID  . atazanavir  300 mg Oral Q breakfast  . calcitRIOL  0.25 mcg Oral Daily  . clonazePAM  2 mg  Oral QHS  . divalproex  500 mg Oral QHS  . famotidine  20 mg Oral Q24H  . ferrous sulfate  325 mg Oral Daily  . gabapentin  600 mg Oral BID  . heparin  5,000 Units Subcutaneous 3 times per day  . lamiVUDine  150 mg Oral Daily  . levofloxacin (LEVAQUIN) IV  750 mg Intravenous Q48H  . OLANZapine  20 mg Oral QHS  . ritonavir  100 mg Oral Q breakfast  . sodium chloride  3 mL Intravenous Q12H  . tamsulosin  0.4 mg Oral Daily  . traZODone  100 mg Oral QHS      Physical Exam: Vital signs in last 24 hours: Temp:  [98.5 F (36.9 C)-99.8 F (37.7 C)] 99.8 F (37.7 C) (03/15 1451) Pulse Rate:  [86-99] 86 (03/15 1451) Resp:  [20] 20 (03/15 1451) BP: (126-157)/(67-73) 126/73 mmHg (03/15 1451) SpO2:  [94 %-98 %] 95 % (03/15 1451) Weight change:  Last BM Date:  (unsure, pt unable to verbalize )  Intake/Output from previous day: 03/14 0701 - 03/15 0700 In: 3 [I.V.:3] Out: 2100 [Urine:2100] Total I/O In: 0  Out: 750  [Urine:750]   Physical Exam: General- pt is screaming Resp- No acute REsp distress. CVS- does not allow me to touch GI does not allow me to touch EXT- NO LE Edema, no Cyanosis Psych-acutely psychotic CNS- moving all 4 ext, NO facial droop   Lab Results: CBC  Recent Labs  06/20/14 0620 06/21/14 0625  WBC 10.9* 13.5*  HGB 11.6* 13.3  HCT 38.2* 43.2  PLT 223 249    BMET  Recent Labs  06/20/14 0620 06/21/14 0625  NA 151* 153*  K 4.0 3.9  CL 120* 120*  CO2 25 24  GLUCOSE 126* 152*  BUN 18 26*  CREATININE 1.97* 2.53*  CALCIUM 9.7 9.9   Trend Sodium 2016 142=>151=>153 2013 137=>151=>142  Trend Creat 2016 2.2=>1.97=>2.53 2015  1.68-1.8 2014  2.0--2.4 2013 7.36=>2.34 (AKI )  Trend Lithium 2016 1.76=>1.16 2013 2.47=>1.31   MICRO Recent Results (from the past 240 hour(s))  Culture, blood (single)     Status: None (Preliminary result)   Collection Time: 06/20/14  3:17 PM  Result Value Ref Range Status   Specimen Description BLOOD RIGHT ANTECUBITAL  Final   Special Requests BOTTLES DRAWN AEROBIC AND ANAEROBIC 8CC  Final   Culture NO GROWTH 1 DAY  Final   Report Status PENDING  Incomplete      Lab Results  Component Value Date   PTH 242.7* 05/09/2011   CALCIUM 9.9 06/21/2014   PHOS 2.7 07/22/2012   Renal U/s  Right Kidney:Length: 11.5 cm Left Kidney:Length: 11.3 cm. No renal sonographic abnormalities identified.    CXr  NO active ds  Impression: 1)Renal  AKI secondary to Post renal/ ATN                   Post renal ( high post void residual)                AKi on CKD               CKD stage 3/4.               CKD since 2013               CKD secondary to HTN/ Ch lithium use/ AKI                Progression of CKD marked with  multiple AKI            2)HTN BP stable  3)Anemia HGb at goal (9--11)   4)CKD Mineral-Bone Disorder PTH elevated  Secondary Hyperparathyroidism present  Phosphorus at goal.   5)Hypernatremia         Nephrogenic DI vs IVF          Data against   Nephrogenic DI- Urine output is  not polyuria                                    Less than  2.5 liters on 3/14/ and 3/15           6)Psych- schizophrenia    7)Acid base Co2 at goal   8) ID- UTI      On levaquin           HIV- lon HAART  9) CNS- admitted with AMS        ? Sec to ID / lithium/Psych         Primary team following  Plan:  Agree with current tx and plan If Sodium better by am, will change to 1/2 Ns Will follow Bmet      Dupuyer S 06/21/2014, 5:18 PM

## 2014-06-21 NOTE — Progress Notes (Signed)
[facility] PROGRESS NOTE  Nathan [name] YHC:[id number] DOB: 10-31-1960 DOA: 06/18/2014 PCP: Shawn Stall, NP  Assessment/Plan: Lithium Toxicity -Levels down to normal with hydration. -Suspect secondary to ARF with decreased PO intake given recent flu. -Continue IVF for now. -Will ask psychiatry to assist with alternate management of his bipolar disorder as I believe he should not be placed back on lithium given his CKD.  Acute on CKD Stage III -Baseline cr 1.6-1.8. -Renal US negative. -Cr back up to 2.53. -Continue IVF. -Will request renal input.  Hypernatremia -likely related to copious amounts of saline provided. -Hope is not developing DI. -Change IVF to D5  3/14. -Na continues to increase today. -Continue D5 and request nephrology input into possibly nephrogenic DI from lithium.  Acute Urinary Retention -Keep foley today. -Attempt voiding trial once more alert. -May need to DC with foley and have him f/u with GU as an OP.  ?CAP/UTI -Continue levaquin. -Spiked temp: blood cx/CXR (NAD) and repeat UA. -All cx data remains negative to date.  Influenza -Completed 5 days of tamiflu 3/13.  HIV/AIDS -Continue HAART.  Acute Encephalopathy -Presumed 2/2 CAP/UTI/hypernatremia/lithium toxicity on top of baseline psychiatric disorders. -Far off from his baseline according to ALF.   Code Status: Full Code Family Communication: Patient only  Disposition Plan: Back to ALF once ready   Consultants:  None   Antibiotics:  Levaquin   Subjective: Restless in bed, Wrist restraints on.  Objective: Filed Vitals:   06/20/14 1816 06/20/14 1959 06/21/14 0608 06/21/14 1451  BP:  157/73 130/67 126/73  Pulse:  99 86 86  Temp: 98.5 F (36.9 C) 98.8 F (37.1 C) 98.5 F (36.9 C) 99.8 F (37.7 C)  TempSrc: Axillary Axillary Axillary Axillary  Resp:  20 20 20   Height:      Weight:      SpO2:  94% 98% 95%    Intake/Output Summary (Last 24 hours)  at 06/21/14 1740 Last data filed at 06/21/14 1506  Gross per 24 hour  Intake      0 ml  Output   1850 ml  Net  -1850 ml   Filed Weights   06/18/14 1435  Weight: 94.3 kg (207 lb 14.3 oz)    Exam:   General:  Awake, not oriented  Cardiovascular: RRR  Respiratory: CTA B  Abdomen: S/NT/ND/+BS  Extremities: no C/C/E   Neurologic:  Moves all 4 spontaneously  Data Reviewed: Basic Metabolic Panel:  Recent Labs Lab 06/18/14 1121 06/19/14 0601 06/20/14 0620 06/21/14 0625  NA 142 147* 151* 153*  K 4.3 4.9 4.0 3.9  CL 108 115* 120* 120*  CO2 30 27 25 24   GLUCOSE 139* 115* 126* 152*  BUN 21 19 18  26*  CREATININE 2.23* 1.99* 1.97* 2.53*  CALCIUM 10.0 9.7 9.7 9.9   Liver Function Tests:  Recent Labs Lab 06/18/14 1121  AST 27  ALT 41  ALKPHOS 53  BILITOT 1.4*  PROT 8.8*  ALBUMIN 4.3   No results for input(s): LIPASE, AMYLASE in the last 168 hours. No results for input(s): AMMONIA in the last 168 hours. CBC:  Recent Labs Lab 06/18/14 1121 06/19/14 0601 06/20/14 0620 06/21/14 0625  WBC 8.4 10.4 10.9* 13.5*  NEUTROABS 6.0  --   --   --   HGB 11.7* 11.5* 11.6* 13.3  HCT 37.1* 37.5* 38.2* 43.2  MCV 94.4 95.2 95.7 95.2  PLT 183 191 223 249   Cardiac Enzymes: No results for input(s): CKTOTAL, CKMB, CKMBINDEX, TROPONINI  in the last 168 hours. BNP (last 3 results) No results for input(s): BNP in the last 8760 hours.  ProBNP (last 3 results) No results for input(s): PROBNP in the last 8760 hours.  CBG: No results for input(s): GLUCAP in the last 168 hours.  Recent Results (from the past 240 hour(s))  Culture, blood (single)     Status: None (Preliminary result)   Collection Time: 06/20/14  3:17 PM  Result Value Ref Range Status   Specimen Description BLOOD RIGHT ANTECUBITAL  Final   Special Requests BOTTLES DRAWN AEROBIC AND ANAEROBIC 8CC  Final   Culture NO GROWTH 1 DAY  Final   Report Status PENDING  Incomplete     Studies: Dg Chest Port 1  View  06/20/2014   CLINICAL DATA:  Fevers  EXAM: PORTABLE CHEST - 1 VIEW  COMPARISON:  06/18/2014  FINDINGS: Cardiac shadow is within normal limits. The lungs are well aerated bilaterally. No acute bony abnormality is noted.  IMPRESSION: No active disease.   Electronically Signed   By: Inez Catalina M.D.   On: 06/20/2014 15:32    Scheduled Meds: . abacavir  600 mg Oral Daily  . antiseptic oral rinse  7 mL Mouth Rinse BID  . atazanavir  300 mg Oral Q breakfast  . calcitRIOL  0.25 mcg Oral Daily  . clonazePAM  2 mg Oral QHS  . divalproex  500 mg Oral QHS  . famotidine  20 mg Oral Q24H  . ferrous sulfate  325 mg Oral Daily  . gabapentin  600 mg Oral BID  . heparin  5,000 Units Subcutaneous 3 times per day  . lamiVUDine  150 mg Oral Daily  . levofloxacin (LEVAQUIN) IV  750 mg Intravenous Q48H  . OLANZapine  20 mg Oral QHS  . ritonavir  100 mg Oral Q breakfast  . sodium chloride  3 mL Intravenous Q12H  . tamsulosin  0.4 mg Oral Daily  . traZODone  100 mg Oral QHS   Continuous Infusions: . dextrose 1 mL (06/21/14 0544)    Principal Problem:   Lithium toxicity Active Problems:   AIDS   Bipolar 1 disorder, mixed, moderate   Mental retardation   CKD (chronic kidney disease) stage 3, GFR 30-59 ml/min   Dehydration   ARF (acute renal failure)   Acute urinary retention   CAP (community acquired pneumonia)   Hypernatremia    Time spent: 25 minutes. Greater than 50% of this time was spent in direct contact with the patient coordinating care.    Lelon Frohlich  [facility] Pager [phone]  If 7PM-7AM, please contact night-coverage at [email or web address], password [facility] 06/21/2014, 5:40 PM  LOS: 3 days

## 2014-06-21 NOTE — Progress Notes (Signed)
ANTIBIOTIC CONSULT NOTE - FOLLOW UP  Pharmacy Consult for Levaquin Indication: pneumonia  No Known Allergies  Patient Measurements: Height: 5' 8.9" (175 cm) Weight: 207 lb 14.3 oz (94.3 kg) IBW/kg (Calculated) : 70.47  Vital Signs: Temp: 98.5 F (36.9 C) (03/15 0608) Temp Source: Axillary (03/15 0608) BP: 130/67 mmHg (03/15 0608) Pulse Rate: 86 (03/15 0608) Intake/Output from previous day: 03/14 0701 - 03/15 0700 In: 3 [I.V.:3] Out: 2100 [Urine:2100] Intake/Output from this shift:    Labs:  Recent Labs  06/19/14 0601 06/20/14 0620 06/21/14 0625  WBC 10.4 10.9* 13.5*  HGB 11.5* 11.6* 13.3  PLT 191 223 249  CREATININE 1.99* 1.97* 2.53*   Estimated Creatinine Clearance: 37.3 mL/min (by C-G formula based on Cr of 2.53). No results for input(s): VANCOTROUGH, VANCOPEAK, VANCORANDOM, GENTTROUGH, GENTPEAK, GENTRANDOM, TOBRATROUGH, TOBRAPEAK, TOBRARND, AMIKACINPEAK, AMIKACINTROU, AMIKACIN in the last 72 hours.   Microbiology: Recent Results (from the past 720 hour(s))  Culture, blood (single)     Status: None (Preliminary result)   Collection Time: 06/20/14  3:17 PM  Result Value Ref Range Status   Specimen Description BLOOD RIGHT ANTECUBITAL  Final   Special Requests BOTTLES DRAWN AEROBIC AND ANAEROBIC 8CC  Final   Culture NO GROWTH 1 DAY  Final   Report Status PENDING  Incomplete    Anti-infectives    Start     Dose/Rate Route Frequency Ordered Stop   06/20/14 1200  levofloxacin (LEVAQUIN) IVPB 750 mg     750 mg 100 mL/hr over 90 Minutes Intravenous Every 48 hours 06/18/14 1640     06/19/14 1000  abacavir (ZIAGEN) tablet 600 mg     600 mg Oral Daily 06/18/14 1517     06/19/14 0800  atazanavir (REYATAZ) capsule 300 mg     300 mg Oral Daily with breakfast 06/18/14 1517     06/19/14 0800  ritonavir (NORVIR) tablet 100 mg     100 mg Oral Daily with breakfast 06/18/14 1517     06/18/14 2200  oseltamivir (TAMIFLU) capsule 30 mg     30 mg Oral 2 times daily 06/18/14  1640 06/20/14 0959   06/18/14 1600  vancomycin (VANCOCIN) 1,250 mg in sodium chloride 0.9 % 250 mL IVPB  Status:  Discontinued     1,250 mg 166.7 mL/hr over 90 Minutes Intravenous  Once 06/18/14 1453 06/18/14 1517   06/18/14 1545  levofloxacin (LEVAQUIN) IVPB 750 mg     750 mg 100 mL/hr over 90 Minutes Intravenous  Once 06/18/14 1542 06/18/14 1715   06/18/14 1530  oseltamivir (TAMIFLU) capsule 75 mg  Status:  Discontinued     75 mg Oral 2 times daily 06/18/14 1517 06/18/14 1638   06/18/14 1530  lamiVUDine (EPIVIR) tablet 150 mg     150 mg Oral Daily 06/18/14 1517     06/18/14 1500  ceFEPIme (MAXIPIME) 2 g in dextrose 5 % 50 mL IVPB  Status:  Discontinued     2 g 100 mL/hr over 30 Minutes Intravenous  Once 06/18/14 1453 06/18/14 1517     Assessment: Okay for Protocol, Levaquin for CAP.  Poor renal function.  Pt spiked a temp.  WBC worse today.  Goal of Therapy:  Eradicate infection.   Plan:  Continue Levaquin 750mg  IV every 48 hours. Monitor labs, micro and vitals.  Hart Robinsons A 06/21/2014,12:50 PM

## 2014-06-22 LAB — BASIC METABOLIC PANEL
Anion gap: 9 (ref 5–15)
BUN: 33 mg/dL — ABNORMAL HIGH (ref 6–23)
CHLORIDE: 117 mmol/L — AB (ref 96–112)
CO2: 26 mmol/L (ref 19–32)
Calcium: 9.8 mg/dL (ref 8.4–10.5)
Creatinine, Ser: 2.54 mg/dL — ABNORMAL HIGH (ref 0.50–1.35)
GFR calc Af Amer: 31 mL/min — ABNORMAL LOW (ref >90)
GFR, EST NON AFRICAN AMERICAN: 27 mL/min — AB (ref >90)
GLUCOSE: 166 mg/dL — AB (ref 70–99)
Potassium: 3.9 mmol/L (ref 3.5–5.1)
Sodium: 152 mmol/L — ABNORMAL HIGH (ref 135–145)

## 2014-06-22 MED ORDER — CLONAZEPAM 0.5 MG PO TABS
0.5000 mg | ORAL_TABLET | Freq: Two times a day (BID) | ORAL | Status: DC
  Administered 2014-06-22 – 2014-06-25 (×7): 0.5 mg via ORAL
  Filled 2014-06-22 (×7): qty 1

## 2014-06-22 MED ORDER — OLANZAPINE 5 MG PO TABS
5.0000 mg | ORAL_TABLET | Freq: Every day | ORAL | Status: DC
  Administered 2014-06-22 – 2014-06-24 (×3): 5 mg via ORAL
  Filled 2014-06-22 (×3): qty 1

## 2014-06-22 NOTE — Clinical Social Work Note (Signed)
CSW updated [facility] on pt. Staff have been visiting pt during hospital stay. Notified of some improvement. Per Aram Beecham Production designer, theatre/television/film) facility will accept pt at d/c.   Benay Pike, [place]

## 2014-06-22 NOTE — Progress Notes (Signed)
[facility] PROGRESS NOTE  Nathan [name] MLY:[id number] DOB: 1961/03/11 DOA: 06/18/2014 PCP: Shawn Stall, NP  Assessment/Plan: Lithium Toxicity -Levels down to normal with hydration. -Suspect secondary to ARF with decreased PO intake given recent flu. -Continue IVF for now.  Acute on CKD Stage III -Baseline cr 1.6-1.8. -Renal US negative. -Cr back up to 2.53. -Continue IVF. -Renal following. -?new baseline for him.  Hypernatremia -likely related to copious amounts of saline provided. -Hope is not developing DI related to lithium use. -Changed IVF to D5  3/14. -Na is stable to slightly decreased. -Renal following.  Acute Urinary Retention -Keep foley today. -Attempt voiding trial once more alert. -May need to DC with foley and have him f/u with GU as an OP.  ?CAP/UTI -Continue levaquin. -Spiked temp: blood cx/CXR (NAD) and repeat UA. -All cx data remains negative to date.  Influenza -Completed 5 days of tamiflu 3/13.  HIV/AIDS -Continue HAART.  Acute Encephalopathy -Presumed 2/2 CAP/UTI/hypernatremia/lithium toxicity on top of baseline psychiatric disorders. -Far off from his baseline according to ALF. -Seems slightly improved today .  Bipolar Disorder -Patient had tele-psych evaluation. -They are recommending Zyprexa 5 mg qHs as well as klonopin 0.5 mg BID as a substitution for lithium. -Will order.   Code Status: Full Code Family Communication: Patient only  Disposition Plan: Back to ALF once ready   Consultants:  None   Antibiotics:  Levaquin   Subjective: Restless in bed, Wrist restraints on. Was able to make eye contact with me and responded with "fine" when I asked how he was doing.   Objective: Filed Vitals:   06/21/14 0608 06/21/14 1451 06/21/14 2158 06/22/14 0519  BP: 130/67 126/73 122/70 156/82  Pulse: 86 86 69 69  Temp: 98.5 F (36.9 C) 99.8 F (37.7 C) 98.1 F (36.7 C) 98.7 F (37.1 C)  TempSrc: Axillary  Axillary Axillary Axillary  Resp: 20 20 20 20   Height:      Weight:      SpO2: 98% 95% 96% 98%    Intake/Output Summary (Last 24 hours) at 06/22/14 1218 Last data filed at 06/22/14 0948  Gross per 24 hour  Intake    240 ml  Output   1650 ml  Net  -1410 ml   Filed Weights   06/18/14 1435  Weight: 94.3 kg (207 lb 14.3 oz)    Exam:   General:  Awake, not oriented  Cardiovascular: RRR  Respiratory: CTA B  Abdomen: S/NT/ND/+BS  Extremities: no C/C/E   Neurologic:  Moves all 4 spontaneously  Data Reviewed: Basic Metabolic Panel:  Recent Labs Lab 06/18/14 1121 06/19/14 0601 06/20/14 0620 06/21/14 0625 06/22/14 0631  NA 142 147* 151* 153* 152*  K 4.3 4.9 4.0 3.9 3.9  CL 108 115* 120* 120* 117*  CO2 30 27 25 24 26   GLUCOSE 139* 115* 126* 152* 166*  BUN 21 19 18  26* 33*  CREATININE 2.23* 1.99* 1.97* 2.53* 2.54*  CALCIUM 10.0 9.7 9.7 9.9 9.8   Liver Function Tests:  Recent Labs Lab 06/18/14 1121  AST 27  ALT 41  ALKPHOS 53  BILITOT 1.4*  PROT 8.8*  ALBUMIN 4.3   No results for input(s): LIPASE, AMYLASE in the last 168 hours. No results for input(s): AMMONIA in the last 168 hours. CBC:  Recent Labs Lab 06/18/14 1121 06/19/14 0601 06/20/14 0620 06/21/14 0625  WBC 8.4 10.4 10.9* 13.5*  NEUTROABS 6.0  --   --   --   HGB 11.7* 11.5*  11.6* 13.3  HCT 37.1* 37.5* 38.2* 43.2  MCV 94.4 95.2 95.7 95.2  PLT 183 191 223 249   Cardiac Enzymes: No results for input(s): CKTOTAL, CKMB, CKMBINDEX, TROPONINI in the last 168 hours. BNP (last 3 results) No results for input(s): BNP in the last 8760 hours.  ProBNP (last 3 results) No results for input(s): PROBNP in the last 8760 hours.  CBG: No results for input(s): GLUCAP in the last 168 hours.  Recent Results (from the past 240 hour(s))  Culture, blood (single)     Status: None (Preliminary result)   Collection Time: 06/20/14  3:17 PM  Result Value Ref Range Status   Specimen Description BLOOD RIGHT  ANTECUBITAL  Final   Special Requests BOTTLES DRAWN AEROBIC AND ANAEROBIC 8CC  Final   Culture NO GROWTH 2 DAYS  Final   Report Status PENDING  Incomplete  Urine culture     Status: None   Collection Time: 06/20/14  6:23 PM  Result Value Ref Range Status   Specimen Description URINE, CATHETERIZED  Final   Special Requests NONE  Final   Colony Count NO GROWTH Performed at [facility]   Final   Culture NO GROWTH Performed at [facility]   Final   Report Status 06/21/2014 FINAL  Final     Studies: Dg Chest Port 1 View  06/20/2014   CLINICAL DATA:  Fevers  EXAM: PORTABLE CHEST - 1 VIEW  COMPARISON:  06/18/2014  FINDINGS: Cardiac shadow is within normal limits. The lungs are well aerated bilaterally. No acute bony abnormality is noted.  IMPRESSION: No active disease.   Electronically Signed   By: Inez Catalina M.D.   On: 06/20/2014 15:32    Scheduled Meds: . abacavir  600 mg Oral Daily  . antiseptic oral rinse  7 mL Mouth Rinse BID  . atazanavir  300 mg Oral Q breakfast  . calcitRIOL  0.25 mcg Oral Daily  . clonazePAM  2 mg Oral QHS  . divalproex  500 mg Oral QHS  . famotidine  20 mg Oral Q24H  . ferrous sulfate  325 mg Oral Daily  . gabapentin  600 mg Oral BID  . heparin  5,000 Units Subcutaneous 3 times per day  . lamiVUDine  150 mg Oral Daily  . levofloxacin (LEVAQUIN) IV  750 mg Intravenous Q48H  . OLANZapine  20 mg Oral QHS  . ritonavir  100 mg Oral Q breakfast  . sodium chloride  3 mL Intravenous Q12H  . tamsulosin  0.4 mg Oral Daily  . traZODone  100 mg Oral QHS   Continuous Infusions: . dextrose 1 mL (06/22/14 0510)    Principal Problem:   Lithium toxicity Active Problems:   AIDS   Bipolar 1 disorder, mixed, moderate   Mental retardation   CKD (chronic kidney disease) stage 3, GFR 30-59 ml/min   Dehydration   ARF (acute renal failure)   Acute urinary retention   CAP (community acquired pneumonia)   Hypernatremia    Time spent: 25  minutes. Greater than 50% of this time was spent in direct contact with the patient coordinating care.    Lelon Frohlich  [facility] Pager [phone]  If 7PM-7AM, please contact night-coverage at [email or web address], password [facility] 06/22/2014, 12:17 PM  LOS: 4 days

## 2014-06-22 NOTE — Progress Notes (Signed)
Nathan [name]  MRN: [id number]  DOB/AGE: Mar 31, 1961 54 y.o.  Primary Care Physician:Gusler, Christin Z, NP  Admit date: 06/18/2014  Chief Complaint:  Chief Complaint  Patient presents with  . Abdominal Pain    S-Pt presented on  06/18/2014 with  Chief Complaint  Patient presents with  . Abdominal Pain  .    Pt offers no complaints    Pt is not yelling today, resting comfortably.  Meds  . abacavir  600 mg Oral Daily  . antiseptic oral rinse  7 mL Mouth Rinse BID  . atazanavir  300 mg Oral Q breakfast  . calcitRIOL  0.25 mcg Oral Daily  . clonazePAM  0.5 mg Oral BID  . clonazePAM  2 mg Oral QHS  . divalproex  500 mg Oral QHS  . famotidine  20 mg Oral Q24H  . ferrous sulfate  325 mg Oral Daily  . gabapentin  600 mg Oral BID  . heparin  5,000 Units Subcutaneous 3 times per day  . lamiVUDine  150 mg Oral Daily  . levofloxacin (LEVAQUIN) IV  750 mg Intravenous Q48H  . OLANZapine  5 mg Oral QHS  . ritonavir  100 mg Oral Q breakfast  . sodium chloride  3 mL Intravenous Q12H  . tamsulosin  0.4 mg Oral Daily  . traZODone  100 mg Oral QHS      Physical Exam: Vital signs in last 24 hours: Temp:  [98.1 F (36.7 C)-98.7 F (37.1 C)] 98.2 F (36.8 C) (03/16 1529) Pulse Rate:  [69-91] 91 (03/16 1529) Resp:  [20] 20 (03/16 1529) BP: (110-156)/(70-82) 110/82 mmHg (03/16 1529) SpO2:  [96 %-98 %] 98 % (03/16 0519) Weight change:  Last BM Date:  (unsure, pt unable to verbalize )  Intake/Output from previous day: 03/15 0701 - 03/16 0700 In: 0  Out: 1650 [Urine:1650] Total I/O In: 480 [P.O.:480] Out: -    Physical Exam: General- opens eyes, minimally communicating Resp- No acute REsp distress.CT B/L CVS- S1S2 regular in rate and rhythm GI bs +, soft, NT EXT- NO LE Edema, no Cyanosis CNS- moving all 4 ext, NO facial droop   Lab Results: CBC  Recent Labs  06/20/14 0620 06/21/14 0625  WBC 10.9* 13.5*  HGB 11.6* 13.3  HCT 38.2* 43.2  PLT 223 249     BMET  Recent Labs  06/21/14 0625 06/22/14 0631  NA 153* 152*  K 3.9 3.9  CL 120* 117*  CO2 24 26  GLUCOSE 152* 166*  BUN 26* 33*  CREATININE 2.53* 2.54*  CALCIUM 9.9 9.8   Trend Sodium 2016 142=>151=>153=>152 2013 137=>151=>142  Trend Creat 2016 2.2=>1.97=>2.53--2.54 2015 1.68-1.8 2014 2.0--2.4 2013 7.36=>2.34 (AKI )  Trend Lithium 2016 1.76=>1.16 2013 2.47=>1.31     MICRO Recent Results (from the past 240 hour(s))  Culture, blood (single)     Status: None (Preliminary result)   Collection Time: 06/20/14  3:17 PM  Result Value Ref Range Status   Specimen Description BLOOD RIGHT ANTECUBITAL  Final   Special Requests BOTTLES DRAWN AEROBIC AND ANAEROBIC 8CC  Final   Culture NO GROWTH 2 DAYS  Final   Report Status PENDING  Incomplete  Urine culture     Status: None   Collection Time: 06/20/14  6:23 PM  Result Value Ref Range Status   Specimen Description URINE, CATHETERIZED  Final   Special Requests NONE  Final   Colony Count NO GROWTH Performed at [facility]   Final   Culture NO  GROWTH Performed at [facility]   Final   Report Status 06/21/2014 FINAL  Final      Lab Results  Component Value Date   PTH 242.7* 05/09/2011   CALCIUM 9.8 06/22/2014   PHOS 2.7 07/22/2012         Impression: 1)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  CKD since 2013  CKD secondary to HTN/ Ch lithium use/ AKI  Progression of CKD marked with multiple AKI   2)HTN BP stable  3)Anemia HGb at goal (9--11)   4)CKD Mineral-Bone Disorder PTH elevated  Secondary Hyperparathyroidism present  Phosphorus at goal.   5)Hypernatremia  Nephrogenic DI vs IVF  Data against Nephrogenic DI- Urine output is not polyuria  Less than 2.5  liters on 3/14, 3/15 and 3/16 9 so far)    6)Psych- schizophrenia   7)Acid base Co2 at goal   8) ID- UTI  On levaquin   HIV- on HAART  9) CNS- admitted with AMS  ? Sec to ID / lithium/Psych         Clinically better.  Primary team following  Plan:  Will continue current care      Churdan S 06/22/2014, 4:41 PM

## 2014-06-22 NOTE — Progress Notes (Signed)
[place] with [facility] regarding psychiatry consult with patient. Awaiting a call back at this time.

## 2014-06-23 DIAGNOSIS — G9340 Encephalopathy, unspecified: Secondary | ICD-10-CM

## 2014-06-23 LAB — BASIC METABOLIC PANEL
Anion gap: 7 (ref 5–15)
BUN: 40 mg/dL — AB (ref 6–23)
CO2: 27 mmol/L (ref 19–32)
Calcium: 9.5 mg/dL (ref 8.4–10.5)
Chloride: 115 mmol/L — ABNORMAL HIGH (ref 96–112)
Creatinine, Ser: 2.7 mg/dL — ABNORMAL HIGH (ref 0.50–1.35)
GFR calc Af Amer: 29 mL/min — ABNORMAL LOW (ref >90)
GFR calc non Af Amer: 25 mL/min — ABNORMAL LOW (ref >90)
Glucose, Bld: 194 mg/dL — ABNORMAL HIGH (ref 70–99)
Potassium: 4.2 mmol/L (ref 3.5–5.1)
Sodium: 149 mmol/L — ABNORMAL HIGH (ref 135–145)

## 2014-06-23 LAB — CBC
HEMATOCRIT: 43.9 % (ref 39.0–52.0)
Hemoglobin: 13.6 g/dL (ref 13.0–17.0)
MCH: 29.7 pg (ref 26.0–34.0)
MCHC: 31 g/dL (ref 30.0–36.0)
MCV: 95.9 fL (ref 78.0–100.0)
Platelets: 231 10*3/uL (ref 150–400)
RBC: 4.58 MIL/uL (ref 4.22–5.81)
RDW: 14 % (ref 11.5–15.5)
WBC: 13.8 10*3/uL — ABNORMAL HIGH (ref 4.0–10.5)

## 2014-06-23 MED ORDER — LEVOFLOXACIN 750 MG PO TABS
750.0000 mg | ORAL_TABLET | ORAL | Status: DC
  Administered 2014-06-24: 750 mg via ORAL
  Filled 2014-06-23: qty 1

## 2014-06-23 NOTE — Progress Notes (Signed)
PHARMACIST - PHYSICIAN COMMUNICATION DR:   [facility] CONCERNING: Antibiotic IV to Oral Route Change Policy  RECOMMENDATION: This patient is receiving Levaquin by the intravenous route.  Based on criteria approved by the Pharmacy and Therapeutics Committee, the antibiotic(s) is/are being converted to the equivalent oral dose form(s).   DESCRIPTION: These criteria include:  Patient being treated for a respiratory tract infection, urinary tract infection, cellulitis or clostridium difficile associated diarrhea if on metronidazole  The patient is not neutropenic and does not exhibit a GI malabsorption state  The patient is eating (either orally or via tube) and/or has been taking other orally administered medications for a least 24 hours  The patient is improving clinically and has a Tmax < 100.5  If you have questions about this conversion, please contact the Pharmacy Department  [x]   [phone] )  [facility] []   [phone] )  [facility]  []   [phone] )  [facility] []   [phone] )  [place] [place], [facility]  06/23/2014 12:21 PM

## 2014-06-23 NOTE — Progress Notes (Addendum)
Subjective: Interval History: none. and has no complaint of nausea or vomiting or difficulty in his breathng  Objective: Vital signs in last 24 hours: Temp:  [97.6 F (36.4 C)-98.4 F (36.9 C)] 97.6 F (36.4 C) (03/17 0500) Pulse Rate:  [75-91] 75 (03/17 0500) Resp:  [20-24] 22 (03/17 0500) BP: (110-112)/(67-90) 112/90 mmHg (03/17 0500) SpO2:  [100 %] 100 % (03/17 0500) Weight change:   Intake/Output from previous day: 03/16 0701 - 03/17 0700 In: 5191.3 [P.O.:720; I.V.:4471.3] Out: 1400 [Urine:1400] Intake/Output this shift:    General appearance: alert, cooperative and no distress Resp: clear to auscultation bilaterally Cardio: regular rate and rhythm, S1, S2 normal, no murmur, click, rub or gallop GI: soft, non-tender; bowel sounds normal; no masses,  no organomegaly Extremities: extremities normal, atraumatic, no cyanosis or edema  Lab Results:  Recent Labs  06/21/14 0625 06/23/14 0603  WBC 13.5* 13.8*  HGB 13.3 13.6  HCT 43.2 43.9  PLT 249 231   BMET:  Recent Labs  06/22/14 0631 06/23/14 0603  NA 152* 149*  K 3.9 4.2  CL 117* 115*  CO2 26 27  GLUCOSE 166* 194*  BUN 33* 40*  CREATININE 2.54* 2.70*  CALCIUM 9.8 9.5   No results for input(s): PTH in the last 72 hours. Iron Studies: No results for input(s): IRON, TIBC, TRANSFERRIN, FERRITIN in the last 72 hours.  Studies/Results: No results found.  I have reviewed the patient's current medications.  Assessment/Plan: Probelm #1 acute kidney injury superimposed on chronic. Presently his BUN and creatinine is stable. Patient does not have any nausea or vomiting. Patient is non-oliguric. Etiology for his acute kidney injury was thought to be secondary to prerenal syndrome/ATN/lithium toxicity Problem #2 chronic renal failure: Stage III possibly a combination of hypertension/interstitial nephritis from lithium/recurrent acute kidney injury/HIV. Patient with stage III chronic renal failure.  Problem #3  lithium toxicity: His his lithium level presently is within therapeutic range Problem #4 bipolar disorder Problem #5 mental retardation: Problem #6 hypernatremia: His sodium is progressively improving. Problem #7 history of AIDS  Plan: We'll increase his IV fluid to 100 mL per hour We check his basic metabolic panel in the morning.   LOS: 5 days   Byanca Kasper S 06/23/2014,8:36 AM   There is significant tenderness there.Marland Kitchen

## 2014-06-23 NOTE — Progress Notes (Signed)
[facility] PROGRESS NOTE  Nathan [name] ZWC:[id number] DOB: Apr 01, 1961 DOA: 06/18/2014 PCP: Shawn Stall, NP  Assessment/Plan: Lithium Toxicity -Levels down to normal with hydration. -Suspect secondary to ARF with decreased PO intake given recent flu. -Continue IVF for now.  Acute on CKD Stage III -Baseline cr 1.6-1.8. -Renal US negative. -Cr has increased again. -Continue IVF at increased rate. -Renal following.  Hypernatremia -likely related to copious amounts of saline provided. -Improving on D5. -Na is down to 149. -Renal following.  Acute Urinary Retention -Keep foley today. -Attempt voiding trial once more alert. -May need to DC with foley and have him f/u with GU as an OP.  ?CAP/UTI -Continue levaquin. -Spiked temp: blood cx/CXR (NAD) and repeat UA. -All cx data remains negative to date.  Influenza -Completed 5 days of tamiflu 3/13.  HIV/AIDS -Continue HAART.  Acute Encephalopathy -Presumed 2/2 CAP/UTI/hypernatremia/lithium toxicity on top of baseline psychiatric disorders. -Much improved today and close to baseline.  Bipolar Disorder -Patient had tele-psych evaluation. -They are recommending Zyprexa 5 mg qHs as well as klonopin 0.5 mg BID as a substitution for lithium.   Code Status: Full Code Family Communication: Patient only  Disposition Plan: Back to ALF once ready, likely 24-48 hours.   Consultants:  None   Antibiotics:  Levaquin   Subjective: Sleeping, but arouses to voice. Responsive. Still with difficult speech. "You are very nice"   Objective: Filed Vitals:   06/22/14 0519 06/22/14 1529 06/22/14 2053 06/23/14 0500  BP: 156/82 110/82 110/67 112/90  Pulse: 69 91 75 75  Temp: 98.7 F (37.1 C) 98.2 F (36.8 C) 98.4 F (36.9 C) 97.6 F (36.4 C)  TempSrc: Axillary Oral Axillary Axillary  Resp: 20 20 24 22   Height:      Weight:      SpO2: 98%  100% 100%    Intake/Output Summary (Last 24 hours) at  06/23/14 1521 Last data filed at 06/23/14 1505  Gross per 24 hour  Intake 5071.25 ml  Output   2200 ml  Net 2871.25 ml   Filed Weights   06/18/14 1435  Weight: 94.3 kg (207 lb 14.3 oz)    Exam:   General:  Sleepy but arousable  Cardiovascular: RRR  Respiratory: CTA B  Abdomen: S/NT/ND/+BS  Extremities: no C/C/E   Neurologic:  Moves all 4 spontaneously  Data Reviewed: Basic Metabolic Panel:  Recent Labs Lab 06/19/14 0601 06/20/14 0620 06/21/14 0625 06/22/14 0631 06/23/14 0603  NA 147* 151* 153* 152* 149*  K 4.9 4.0 3.9 3.9 4.2  CL 115* 120* 120* 117* 115*  CO2 27 25 24 26 27   GLUCOSE 115* 126* 152* 166* 194*  BUN 19 18 26* 33* 40*  CREATININE 1.99* 1.97* 2.53* 2.54* 2.70*  CALCIUM 9.7 9.7 9.9 9.8 9.5   Liver Function Tests:  Recent Labs Lab 06/18/14 1121  AST 27  ALT 41  ALKPHOS 53  BILITOT 1.4*  PROT 8.8*  ALBUMIN 4.3   No results for input(s): LIPASE, AMYLASE in the last 168 hours. No results for input(s): AMMONIA in the last 168 hours. CBC:  Recent Labs Lab 06/18/14 1121 06/19/14 0601 06/20/14 0620 06/21/14 0625 06/23/14 0603  WBC 8.4 10.4 10.9* 13.5* 13.8*  NEUTROABS 6.0  --   --   --   --   HGB 11.7* 11.5* 11.6* 13.3 13.6  HCT 37.1* 37.5* 38.2* 43.2 43.9  MCV 94.4 95.2 95.7 95.2 95.9  PLT 183 191 223 249 231   Cardiac Enzymes: No  results for input(s): CKTOTAL, CKMB, CKMBINDEX, TROPONINI in the last 168 hours. BNP (last 3 results) No results for input(s): BNP in the last 8760 hours.  ProBNP (last 3 results) No results for input(s): PROBNP in the last 8760 hours.  CBG: No results for input(s): GLUCAP in the last 168 hours.  Recent Results (from the past 240 hour(s))  Culture, blood (single)     Status: None (Preliminary result)   Collection Time: 06/20/14  3:17 PM  Result Value Ref Range Status   Specimen Description BLOOD RIGHT ANTECUBITAL  Final   Special Requests BOTTLES DRAWN AEROBIC AND ANAEROBIC 8CC  Final   Culture  NO GROWTH 2 DAYS  Final   Report Status PENDING  Incomplete  Urine culture     Status: None   Collection Time: 06/20/14  6:23 PM  Result Value Ref Range Status   Specimen Description URINE, CATHETERIZED  Final   Special Requests NONE  Final   Colony Count NO GROWTH Performed at [facility]   Final   Culture NO GROWTH Performed at [facility]   Final   Report Status 06/21/2014 FINAL  Final     Studies: No results found.  Scheduled Meds: . abacavir  600 mg Oral Daily  . antiseptic oral rinse  7 mL Mouth Rinse BID  . atazanavir  300 mg Oral Q breakfast  . calcitRIOL  0.25 mcg Oral Daily  . clonazePAM  0.5 mg Oral BID  . clonazePAM  2 mg Oral QHS  . divalproex  500 mg Oral QHS  . famotidine  20 mg Oral Q24H  . ferrous sulfate  325 mg Oral Daily  . gabapentin  600 mg Oral BID  . heparin  5,000 Units Subcutaneous 3 times per day  . lamiVUDine  150 mg Oral Daily  . [START ON 06/24/2014] levofloxacin  750 mg Oral Q48H  . OLANZapine  5 mg Oral QHS  . ritonavir  100 mg Oral Q breakfast  . sodium chloride  3 mL Intravenous Q12H  . tamsulosin  0.4 mg Oral Daily  . traZODone  100 mg Oral QHS   Continuous Infusions: . dextrose 100 mL/hr at 06/23/14 1007    Principal Problem:   Lithium toxicity Active Problems:   AIDS   Bipolar 1 disorder, mixed, moderate   Mental retardation   CKD (chronic kidney disease) stage 3, GFR 30-59 ml/min   Dehydration   ARF (acute renal failure)   Acute urinary retention   CAP (community acquired pneumonia)   Hypernatremia    Time spent: 25 minutes. Greater than 50% of this time was spent in direct contact with the patient coordinating care.    Lelon Frohlich  [facility] Pager [phone]  If 7PM-7AM, please contact night-coverage at [email or web address], password [facility] 06/23/2014, 3:21 PM  LOS: 5 days

## 2014-06-24 DIAGNOSIS — G9340 Encephalopathy, unspecified: Secondary | ICD-10-CM

## 2014-06-24 LAB — BASIC METABOLIC PANEL
ANION GAP: 10 (ref 5–15)
BUN: 34 mg/dL — ABNORMAL HIGH (ref 6–23)
CO2: 26 mmol/L (ref 19–32)
Calcium: 8.9 mg/dL (ref 8.4–10.5)
Chloride: 107 mmol/L (ref 96–112)
Creatinine, Ser: 2.39 mg/dL — ABNORMAL HIGH (ref 0.50–1.35)
GFR calc Af Amer: 33 mL/min — ABNORMAL LOW (ref >90)
GFR calc non Af Amer: 29 mL/min — ABNORMAL LOW (ref >90)
Glucose, Bld: 208 mg/dL — ABNORMAL HIGH (ref 70–99)
POTASSIUM: 3.5 mmol/L (ref 3.5–5.1)
SODIUM: 143 mmol/L (ref 135–145)

## 2014-06-24 NOTE — Progress Notes (Signed)
ANTIBIOTIC CONSULT NOTE  Pharmacy Consult for Levaquin Indication: pneumonia vs UTI  No Known Allergies  Patient Measurements: Height: 5' 8.9" (175 cm) Weight: 207 lb 14.3 oz (94.3 kg) IBW/kg (Calculated) : 70.47  Vital Signs: Temp: 98 F (36.7 C) (03/18 0500) Temp Source: Axillary (03/18 0500) BP: 114/71 mmHg (03/18 0500) Pulse Rate: 74 (03/18 0500) Intake/Output from previous day: 03/17 0701 - 03/18 0700 In: 2877.9 [P.O.:600; I.V.:2277.9] Out: 2650 [Urine:2650] Intake/Output from this shift:    Labs:  Recent Labs  06/22/14 0631 06/23/14 0603 06/24/14 0604  WBC  --  13.8*  --   HGB  --  13.6  --   PLT  --  231  --   CREATININE 2.54* 2.70* 2.39*   Estimated Creatinine Clearance: 39.5 mL/min (by C-G formula based on Cr of 2.39). No results for input(s): VANCOTROUGH, VANCOPEAK, VANCORANDOM, GENTTROUGH, GENTPEAK, GENTRANDOM, TOBRATROUGH, TOBRAPEAK, TOBRARND, AMIKACINPEAK, AMIKACINTROU, AMIKACIN in the last 72 hours.   Microbiology: Recent Results (from the past 720 hour(s))  Culture, blood (single)     Status: None (Preliminary result)   Collection Time: 06/20/14  3:17 PM  Result Value Ref Range Status   Specimen Description BLOOD RIGHT ANTECUBITAL  Final   Special Requests BOTTLES DRAWN AEROBIC AND ANAEROBIC 8CC  Final   Culture NO GROWTH 4 DAYS  Final   Report Status PENDING  Incomplete  Urine culture     Status: None   Collection Time: 06/20/14  6:23 PM  Result Value Ref Range Status   Specimen Description URINE, CATHETERIZED  Final   Special Requests NONE  Final   Colony Count NO GROWTH Performed at [facility]   Final   Culture NO GROWTH Performed at [facility]   Final   Report Status 06/21/2014 FINAL  Final    Anti-infectives    Start     Dose/Rate Route Frequency Ordered Stop   06/24/14 1200  levofloxacin (LEVAQUIN) tablet 750 mg     750 mg Oral Every 48 hours 06/23/14 1222     06/20/14 1200  levofloxacin (LEVAQUIN) IVPB 750 mg   Status:  Discontinued     750 mg 100 mL/hr over 90 Minutes Intravenous Every 48 hours 06/18/14 1640 06/23/14 1222   06/19/14 1000  abacavir (ZIAGEN) tablet 600 mg     600 mg Oral Daily 06/18/14 1517     06/19/14 0800  atazanavir (REYATAZ) capsule 300 mg     300 mg Oral Daily with breakfast 06/18/14 1517     06/19/14 0800  ritonavir (NORVIR) tablet 100 mg     100 mg Oral Daily with breakfast 06/18/14 1517     06/18/14 2200  oseltamivir (TAMIFLU) capsule 30 mg     30 mg Oral 2 times daily 06/18/14 1640 06/20/14 0959   06/18/14 1600  vancomycin (VANCOCIN) 1,250 mg in sodium chloride 0.9 % 250 mL IVPB  Status:  Discontinued     1,250 mg 166.7 mL/hr over 90 Minutes Intravenous  Once 06/18/14 1453 06/18/14 1517   06/18/14 1545  levofloxacin (LEVAQUIN) IVPB 750 mg     750 mg 100 mL/hr over 90 Minutes Intravenous  Once 06/18/14 1542 06/18/14 1715   06/18/14 1530  oseltamivir (TAMIFLU) capsule 75 mg  Status:  Discontinued     75 mg Oral 2 times daily 06/18/14 1517 06/18/14 1638   06/18/14 1530  lamiVUDine (EPIVIR) tablet 150 mg     150 mg Oral Daily 06/18/14 1517     06/18/14 1500  ceFEPIme (  MAXIPIME) 2 g in dextrose 5 % 50 mL IVPB  Status:  Discontinued     2 g 100 mL/hr over 30 Minutes Intravenous  Once 06/18/14 1453 06/18/14 1517     Assessment: 54 yo immunocompromised M admitted with lithium toxicity secondary to dehydration/flu.  He completed course of Tamiflu.  He is also currently on day#7 Levaquin for ?CAP vs UTI. Cx data negative.  CXR did not reveal PNA.  He is afebrile.  Acute on chronic renal insufficiency noted.  Estimated CrCl ~ 35-79ml/min.  Goal of Therapy:  Eradicate infection.   Plan:  Continue Levaquin 750mg  PO every 48 hours. Monitor renal function, cx data, patient progress Duration of therapy per MD- consider d/c antibiotics  Nathan [name], Nathan [name] 06/24/2014,8:55 AM

## 2014-06-24 NOTE — Clinical Social Work Note (Signed)
CSW updated [facility] on pt. They are agreeable to return over weekend.   Benay Pike, [place]

## 2014-06-24 NOTE — Progress Notes (Signed)
Inpatient Diabetes Program Recommendations  AACE/ADA: New Consensus Statement on Inpatient Glycemic Control (2013)  Target Ranges:  Prepandial:   less than 140 mg/dL      Peak postprandial:   less than 180 mg/dL (1-2 hours)      Critically ill patients:  140 - 180 mg/dL   Results for Nathan [name], Nathan [name] (MRN [id number]) as of 06/24/2014 08:34  Ref. Range 06/20/2014 06:20 06/21/2014 06:25 06/22/2014 06:31 06/23/2014 06:03 06/24/2014 06:04  Glucose Latest Range: 70-99 mg/dL 126 (H) 152 (H) 166 (H) 194 (H) 208 (H)    Diabetes history: No Outpatient Diabetes medications: NA Current orders for Inpatient glycemic control: None  Inpatient Diabetes Program Recommendations Correction (SSI): While inpatient, please consider ordering CBGs with Novolog correction scale. HgbA1C: Please consider ordering an A1C to evalute glycemic control over the past 2-3 months. Diet: May want to consider chaning diet from regular to Carb Modified.  Note: Patient has no documented history of diabetes. Fasting glucose on labs have been elevated over the past 4 days and this morning lab glucose is 208 mg/dl.  Thanks, Barnie Alderman, RN, MSN, CCRN, CDE Diabetes Coordinator Inpatient Diabetes Program [phone] (Team Pager from [place] to [place]) [phone] ([place] office) [phone] [facility] [place] office)

## 2014-06-24 NOTE — Progress Notes (Signed)
PROGRESS NOTE  Nathan [name] BTD:[id number] DOB: 10/28/1960 DOA: 06/18/2014 PCP: Shawn Stall, NP  Summary: 54 year old man PMH HIV/AIDS, schizophrenia on lithium presented with lethargy and tremors. Initial evaluation revealed supratherapeutic lithium level and acute kidney injury. He was admitted for further evaluation and treatment of suspected lithium toxicity, acute encephalopathy, pneumonia, acute kidney injury. Hospitalization was complicated by prolonged encephalopathy, uncooperativeness and combativeness. He was seen in consultation with psychiatry which recommended adding Zyprexa 5 mg at night and Klonopin 0.5 mg twice daily as substitution for lithium. Was seen by nephrology for acute kidney injury superimposed on chronic kidney disease.  Assessment/Plan: 1. Acute encephalopathy. Appears resolved. He follows commands. Multifactorial, secondary to lithium toxicity, pneumonia; superimposed on psychiatric disorders. 2. Lithium toxicity spontaneously improved with hydration and withholding of lithium. Thought secondary to acute renal failure, decreased oral intake secondary to recent influenza. 3. Pneumonia. Treated with empiric antibiotics. Afebrile, no hypoxia. Appears clinically resolved. 4. Acute kidney injury superimposed on chronic kidney disease stage III. Renal ultrasound was negative. Likely secondary to dehydration/prerenal, poor oral intake, lithium toxicity. Possible postobstructive component. Continues to improve. Adequate urine output. Adequate oral intake. 5. Hypernatremia. Resolved. Secondary to saline.  6. Acute urinary retention. Likely secondary to acute illness. 7. Nausea, vomiting, diarrhea. Present on admission, resolved. 8. Influenza diagnosed at ALF, treated with Tamiflu. 9. HIV/AIDS. Stable. Followed by Dr. Tommy Medal. 10. Schizophrenia, mental retardation, bipolar 1 disorder, depression on lithium as outpatient. Psychiatry consultation recommended changing to  Zyprexa and Klonopin which appears to have stabilized patient. Lithium discontinued secondary to above. 11. Hyperglycemia, long-standing. Impaired fasting glucose. No symptoms of diabetes at this time. Recommend outpatient follow-up, repeat testing once her acute illness to confirm/rule out diabetes. 12. At baseline is interactive, performs all ADLs independently. Appears to be at baseline at this point.   Overall appears much improved with resolution of encephalopathy, pneumonia and lithium toxicity.   Discussed with nephrology Dr. Lowanda Foster, feels the patient is stable for discharge, recommends repeating basic metabolic panel and 2-3 weeks, not sooner. Continued spontaneous improvement expected.   Discontinue Foley. Voiding trial.  Return to [facility] next 24 hours.  Murray Hodgkins, MD  [facility]  Pager [phone] If 7PM-7AM, please contact night-coverage at [email or web address], password [email or web address] 06/24/2014, 1:37 PM  LOS: 6 days   Consultants:  Nephrology  Procedures:    Antibiotics:  Levaquin 3/12 >> 3/18   HPI/Subjective: Seems to feel well. Denies complaints. "You are nice doctor".  Objective: Filed Vitals:   06/23/14 0500 06/23/14 1712 06/23/14 2135 06/24/14 0500  BP: 112/90 112/67 122/60 114/71  Pulse: 75 77 82 74  Temp: 97.6 F (36.4 C) 97.5 F (36.4 C) 98.1 F (36.7 C) 98 F (36.7 C)  TempSrc: Axillary Axillary Oral Axillary  Resp: 22 20 20 19   Height:      Weight:      SpO2: 100% 96% 98% 97%    Intake/Output Summary (Last 24 hours) at 06/24/14 1337 Last data filed at 06/24/14 0948  Gross per 24 hour  Intake 2637.92 ml  Output   3650 ml  Net -1012.08 ml     Filed Weights   06/18/14 1435  Weight: 94.3 kg (207 lb 14.3 oz)    Exam:     Afebrile, vital signs are stable. No hypoxia. General:  Appears calm and comfortable, eating lunch Cardiovascular: RRR, no m/r/g. No LE edema. Respiratory: CTA bilaterally, no w/r/r. Normal  respiratory effort. Musculoskeletal: grossly normal tone BUE/BLE. Moves all extremities  to command. Psychiatric: He is alert and cooperative. Neurologic: grossly non-focal.  Data Reviewed:  Creatinine slowly improving  Sodium has normalized  Urine culture no growth, final  Blood culture pending, no growth  Chest x-ray initially bronchopneumonia/bronchitis, repeat chest x-ray no acute disease  Renal ultrasound no acute abnormalities noted  EKG sinus rhythm, first-degree AV block, LVH with repolarization abnormality   Scheduled Meds: . abacavir  600 mg Oral Daily  . antiseptic oral rinse  7 mL Mouth Rinse BID  . atazanavir  300 mg Oral Q breakfast  . calcitRIOL  0.25 mcg Oral Daily  . clonazePAM  0.5 mg Oral BID  . clonazePAM  2 mg Oral QHS  . divalproex  500 mg Oral QHS  . famotidine  20 mg Oral Q24H  . ferrous sulfate  325 mg Oral Daily  . gabapentin  600 mg Oral BID  . heparin  5,000 Units Subcutaneous 3 times per day  . lamiVUDine  150 mg Oral Daily  . levofloxacin  750 mg Oral Q48H  . OLANZapine  5 mg Oral QHS  . ritonavir  100 mg Oral Q breakfast  . sodium chloride  3 mL Intravenous Q12H  . tamsulosin  0.4 mg Oral Daily  . traZODone  100 mg Oral QHS   Continuous Infusions: . dextrose 100 mL/hr at 06/24/14 1124    Principal Problem:   Lithium toxicity Active Problems:   AIDS   Bipolar 1 disorder, mixed, moderate   Mental retardation   CKD (chronic kidney disease) stage 3, GFR 30-59 ml/min   Dehydration   ARF (acute renal failure)   Acute urinary retention   CAP (community acquired pneumonia)   Hypernatremia   Acute encephalopathy

## 2014-06-24 NOTE — Progress Notes (Signed)
Subjective: Interval History: Patient denies any nausea or vomiting. Patient states he doesn't feel good butpeak complaints  Objective: Vital signs in last 24 hours: Temp:  [97.5 F (36.4 C)-98.1 F (36.7 C)] 98 F (36.7 C) (03/18 0500) Pulse Rate:  [74-82] 74 (03/18 0500) Resp:  [19-20] 19 (03/18 0500) BP: (112-122)/(60-71) 114/71 mmHg (03/18 0500) SpO2:  [96 %-98 %] 97 % (03/18 0500) Weight change:   Intake/Output from previous day: 03/17 0701 - 03/18 0700 In: 2877.9 [P.O.:600; I.V.:2277.9] Out: 2650 [Urine:2650] Intake/Output this shift:    Patient is alert and in no apparent distress. Chest is clear to auscultation Abdomen soft positive bowel sound Extremities no edema  Lab Results:  Recent Labs  06/23/14 0603  WBC 13.8*  HGB 13.6  HCT 43.9  PLT 231   BMET:   Recent Labs  06/23/14 0603 06/24/14 0604  NA 149* 143  K 4.2 3.5  CL 115* 107  CO2 27 26  GLUCOSE 194* 208*  BUN 40* 34*  CREATININE 2.70* 2.39*  CALCIUM 9.5 8.9   No results for input(s): PTH in the last 72 hours. Iron Studies: No results for input(s): IRON, TIBC, TRANSFERRIN, FERRITIN in the last 72 hours.  Studies/Results: No results found.  I have reviewed the patient's current medications.  Assessment/Plan: Probelm #1 acute kidney injury superimposed on chronic. Patient does not have any nausea or vomiting. Patient is non-oliguric. Etiology for his acute kidney injury was thought to be secondary to prerenal syndrome/ATN/lithium toxicity. Presently his renal function is improving. 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 Patient with stage III chronic renal failure.  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 His sodium has improved Problem #7 history of AIDS  Plan: Continue his present management Encourage patient by mouth fluid intake. We check his basic metabolic panel in the morning.   LOS: 6 days   Helmi Hechavarria S 06/24/2014,9:43 AM   There is significant tenderness there.Marland Kitchen

## 2014-06-25 LAB — BASIC METABOLIC PANEL
ANION GAP: 8 (ref 5–15)
BUN: 30 mg/dL — ABNORMAL HIGH (ref 6–23)
CO2: 26 mmol/L (ref 19–32)
Calcium: 9.5 mg/dL (ref 8.4–10.5)
Chloride: 111 mmol/L (ref 96–112)
Creatinine, Ser: 2.12 mg/dL — ABNORMAL HIGH (ref 0.50–1.35)
GFR, EST AFRICAN AMERICAN: 39 mL/min — AB (ref >90)
GFR, EST NON AFRICAN AMERICAN: 33 mL/min — AB (ref >90)
GLUCOSE: 221 mg/dL — AB (ref 70–99)
Potassium: 4.1 mmol/L (ref 3.5–5.1)
SODIUM: 145 mmol/L (ref 135–145)

## 2014-06-25 LAB — CULTURE, BLOOD (SINGLE): CULTURE: NO GROWTH

## 2014-06-25 MED ORDER — CLONAZEPAM 0.5 MG PO TABS: 0.5000 mg | ORAL_TABLET | Freq: Every day | ORAL | Status: DC

## 2014-06-25 MED ORDER — GABAPENTIN 600 MG PO TABS: 600.0000 mg | ORAL_TABLET | Freq: Two times a day (BID) | ORAL | Status: DC

## 2014-06-25 MED ORDER — OLANZAPINE 5 MG PO TABS: 5.0000 mg | ORAL_TABLET | Freq: Every day | ORAL | Status: DC

## 2014-06-25 NOTE — Progress Notes (Signed)
Updated FL2 faxed to nursing unit per their request.  Pt d/c back to ALF today.

## 2014-06-25 NOTE — Progress Notes (Signed)
PROGRESS NOTE  ZAHID CARNEIRO WIO:[id number] DOB: March 19, 1961 DOA: 06/18/2014 PCP: Shawn Stall, NP  Summary: 54 year old man PMH HIV/AIDS, schizophrenia on lithium presented with lethargy and tremors. Initial evaluation revealed supratherapeutic lithium level and acute kidney injury. He was admitted for further evaluation and treatment of suspected lithium toxicity, acute encephalopathy, pneumonia, acute kidney injury. Hospitalization was complicated by prolonged encephalopathy, uncooperativeness and combativeness. He was seen in consultation with psychiatry which recommended adding Zyprexa 5 mg at night and Klonopin 0.5 mg twice daily as substitution for lithium. Was seen by nephrology for acute kidney injury superimposed on chronic kidney disease.  Assessment/Plan: 1. Acute encephalopathy.resolved. Multifactorial, secondary to lithium toxicity, pneumonia; superimposed on psychiatric disorders. 2. Lithium toxicity resolved with hydration and withholding of lithium. Thought secondary to acute renal failure, decreased oral intake secondary to recent influenza. 3. Pneumonia. Resolved clinically. Completed treatment with empiric antibiotics. Afebrile, no hypoxia.  4. Acute kidney injury superimposed on chronic kidney disease stage III. Continues to improve. Renal ultrasound was negative. Likely secondary to dehydration/prerenal, poor oral intake, lithium toxicity. Possible postobstructive component. Excellent urine output.   5. Hypernatremia. Resolved. Secondary to saline.  6. Acute urinary retention. Resolved. Likely secondary to acute illness. 7. Nausea, vomiting, diarrhea. Present on admission, resolved. 8. Influenza diagnosed at ALF, treated with Tamiflu. 9. HIV/AIDS. Stable. Followed by Dr. Tommy Medal. 10. Schizophrenia, mental retardation, bipolar 1 disorder, depression on lithium as outpatient. Stable. Psychiatry consultation recommended changing to Zyprexa and Klonopin. Lithium  discontinued secondary to above. 11. Hyperglycemia, long-standing but quite labile. Impaired fasting glucose. No symptoms of diabetes at this time. Recommend close outpatient follow-up, repeat testing once acute illness resolved to confirm/rule out diabetes. 12. At baseline is interactive, performs all ADLs independently.    Overall improving. Pneumonia and encephalopathy appear resolved.   Renal function improving.   Per Dr. Lowanda Foster patient is stable for discharge, recommends repeating basic metabolic panel and 2-3 weeks, not sooner. Continued spontaneous improvement expected.   Recommend CBG checks, close outpatient f/u to confirm or rule-out DM.  Lisinopril on hold, consider restarting as renal function improves.  Return to [place] today  Murray Hodgkins, MD  [facility]  Pager [phone] If 7PM-7AM, please contact night-coverage at [email or web address], password [facility] 06/25/2014, 9:28 AM  LOS: 7 days   Consultants:  Nephrology  Procedures:    Antibiotics:  Levaquin 3/12 >> 3/18   HPI/Subjective: Doing fine, no complaints. Voiding. No issues per nursing.  Objective: Filed Vitals:   06/24/14 0500 06/24/14 1425 06/24/14 2159 06/25/14 0704  BP: 114/71 117/64 115/63 110/67  Pulse: 74 80 97 88  Temp: 98 F (36.7 C) 98.5 F (36.9 C) 98.1 F (36.7 C) 99 F (37.2 C)  TempSrc: Axillary Oral Oral Oral  Resp: 19 20 20 15   Height:      Weight:      SpO2: 97% 96% 97% 100%    Intake/Output Summary (Last 24 hours) at 06/25/14 0928 Last data filed at 06/24/14 1814  Gross per 24 hour  Intake    360 ml  Output   2800 ml  Net  -2440 ml     Filed Weights   06/18/14 1435  Weight: 94.3 kg (207 lb 14.3 oz)    Exam:     Afebrile, VSS. No hypoxia. Eating breakfast. General: Appears calm and comfortable Cardiovascular: RRR, no m/r/g. No LE edema. Respiratory: CTA bilaterally, no w/r/r. Normal respiratory effort. Musculoskeletal: grossly normal  tone BUE/BLE. Moves arms and legs to command.  Psychiatric: mood and affect appear stable, follows commands, responds appropriately.  Neurologic: grossly non-focal.  Data Reviewed:  UOP 2800, multiple voids  Creatinine slowly improving 2.39 >> 2.12; BUN trending down  Sodium 145  Urine culture no growth, final  Blood culture final, no growth  Chest x-ray initially bronchopneumonia/bronchitis, repeat chest x-ray no acute disease  Renal ultrasound no acute abnormalities noted  EKG sinus rhythm, first-degree AV block, LVH with repolarization abnormality   Scheduled Meds: . abacavir  600 mg Oral Daily  . antiseptic oral rinse  7 mL Mouth Rinse BID  . atazanavir  300 mg Oral Q breakfast  . calcitRIOL  0.25 mcg Oral Daily  . clonazePAM  0.5 mg Oral BID  . clonazePAM  2 mg Oral QHS  . divalproex  500 mg Oral QHS  . famotidine  20 mg Oral Q24H  . ferrous sulfate  325 mg Oral Daily  . gabapentin  600 mg Oral BID  . heparin  5,000 Units Subcutaneous 3 times per day  . lamiVUDine  150 mg Oral Daily  . OLANZapine  5 mg Oral QHS  . ritonavir  100 mg Oral Q breakfast  . sodium chloride  3 mL Intravenous Q12H  . tamsulosin  0.4 mg Oral Daily  . traZODone  100 mg Oral QHS   Continuous Infusions:    Principal Problem:   Lithium toxicity Active Problems:   AIDS   Bipolar 1 disorder, mixed, moderate   Mental retardation   CKD (chronic kidney disease) stage 3, GFR 30-59 ml/min   Dehydration   ARF (acute renal failure)   Acute urinary retention   CAP (community acquired pneumonia)   Hypernatremia   Acute encephalopathy

## 2014-06-25 NOTE — Progress Notes (Signed)
Subjective: Interval History: Patient presently offers no complaints. He doesn't have any nausea or vomiting.  Objective: Vital signs in last 24 hours: Temp:  [98.1 F (36.7 C)-99 F (37.2 C)] 99 F (37.2 C) (03/19 0704) Pulse Rate:  [80-97] 88 (03/19 0704) Resp:  [15-20] 15 (03/19 0704) BP: (110-117)/(63-67) 110/67 mmHg (03/19 0704) SpO2:  [96 %-100 %] 100 % (03/19 0704) Weight change:   Intake/Output from previous day: 03/18 0701 - 03/19 0700 In: 360 [P.O.:360] Out: 2800 [Urine:2800] Intake/Output this shift:    Patient is alert and in no apparent distress. Seems to be his baseline. Chest is clear to auscultation Abdomen soft positive bowel sound Extremities no edema  Lab Results:  Recent Labs  06/23/14 0603  WBC 13.8*  HGB 13.6  HCT 43.9  PLT 231   BMET:   Recent Labs  06/24/14 0604 06/25/14 0611  NA 143 145  K 3.5 4.1  CL 107 111  CO2 26 26  GLUCOSE 208* 221*  BUN 34* 30*  CREATININE 2.39* 2.12*  CALCIUM 8.9 9.5   No results for input(s): PTH in the last 72 hours. Iron Studies: No results for input(s): IRON, TIBC, TRANSFERRIN, FERRITIN in the last 72 hours.  Studies/Results: No results found.  I have reviewed the patient's current medications.  Assessment/Plan: Probelm #1 acute kidney injury superimposed on chronic. Patient does not have any nausea or vomiting. Patient is non-oliguric. His BUN and creatinine is progressively improving. Presently his creatinine still is above his baseline. 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 Patient with stage III chronic renal failure.  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 His sodium has  improved Problem #7 history of AIDS  Plan: Continue his present management Encourage patient by mouth fluid intake. We check his basic metabolic panel in the morning.   LOS: 7 days   Aleighya Mcanelly S 06/25/2014,8:55 AM   There is significant tenderness there.Marland Kitchen

## 2014-06-25 NOTE — Discharge Summary (Signed)
Physician Discharge Summary  Nathan [name] QPY:[id number] DOB: 09/12/1960 DOA: 06/18/2014  PCP: Shawn Stall, NP  Admit date: 06/18/2014 Discharge date: 06/25/2014  Recommendations for Outpatient Follow-up:  1. Recommend repeat basic metabolic panel in 2-3 weeks to follow-up kidney function. 2. Recommend CBG checks, close outpatient follow-up to confirm or rule out diabetes mellitus.  3. Lisinopril discontinued secondary to acute kidney injury. Consider restarting his renal function normalizes.  4. Lithium discontinued secondary to lithium toxicity. Klonopin low-dose added in the morning in addition to chronic outpatient nightly dose. Zyprexa dose decreased. Tolerating well. 5. Gabapentin dose decreased. 6. Home health RN   Follow-up Information    Follow up with Gusler, Christin Z, NP. Schedule an appointment as soon as possible for a visit in 1 week.   Specialty:  Nurse Practitioner   Contact information:   [place] [address] [place] [address] [phone]      Discharge Diagnoses:  1. Acute encephalopathy secondary to lithium toxicity, pneumonia 2. Lithium toxicity 3. Pneumonia 4. Acute kidney injury superimposed on chronic kidney disease stage III 5. Hypernatremia 6. Acute urinary retention 7. Nausea, vomiting, diarrhea 8. Influenza present prior to admission 9. Hyperglycemia 10. Schizophrenia, mental retardation, bipolar 1 disorder, depression 11. HIV/AIDS  Discharge Condition: Improved Disposition: Return to assisted living facility [facility] recommendation: regular  Filed Weights   06/18/14 1435  Weight: 94.3 kg (207 lb 14.3 oz)    History of present illness:  54 year old man PMH HIV/AIDS, schizophrenia on lithium presented with lethargy and tremors. Initial evaluation revealed supratherapeutic lithium level and acute kidney injury. He was admitted for further evaluation and treatment of suspected lithium toxicity,  acute encephalopathy, pneumonia, acute kidney injury.  Hospital Course:  Mr. Sawyers was treated with antibiotics for pneumonia as well as IV fluids for acute kidney injury. Lithium was withheld and lithium level quickly normalized. Hospitalization was complicated by prolonged encephalopathy, uncooperativeness and combativeness. He was seen in consultation with psychiatry which recommended Zyprexa 5 mg at night and Klonopin 0.5 mg daily as substitution for lithium. Chronic Klonopin dosing 2 mg at night was continued. Was seen by nephrology for acute kidney injury superimposed on chronic kidney disease. His condition gradually improved and he is stable for discharge.  1. Acute encephalopathy.resolved. Multifactorial, secondary to lithium toxicity, pneumonia; superimposed on psychiatric disorders. 2. Lithium toxicity resolved with hydration and withholding of lithium. Thought secondary to acute renal failure, decreased oral intake secondary to recent influenza. 3. Pneumonia. Resolved clinically. Completed treatment with empiric antibiotics. Afebrile, no hypoxia.  4. Acute kidney injury superimposed on chronic kidney disease stage III. Continues to improve. Renal ultrasound was negative. Likely secondary to dehydration/prerenal, poor oral intake, lithium toxicity. Possible postobstructive component. Excellent urine output.  5. Hypernatremia. Resolved. Secondary to saline.  6. Acute urinary retention. Resolved. Likely secondary to acute illness. 7. Nausea, vomiting, diarrhea. Present on admission, resolved. 8. Influenza diagnosed at ALF, treated with Tamiflu. 9. HIV/AIDS. Stable. Followed by Dr. Tommy Medal. 10. Schizophrenia, mental retardation, bipolar 1 disorder, depression on lithium as outpatient. Stable. Psychiatry consultation recommended changing to Zyprexa and Klonopin. Lithium discontinued secondary to above. 11. Hyperglycemia, long-standing but quite labile. Impaired fasting glucose. No  symptoms of diabetes at this time. Recommend close outpatient follow-up, repeat testing once acute illness resolved to confirm/rule out diabetes. 12. At baseline is interactive, performs all ADLs independently.  Discharge Instructions  Discharge Instructions    Activity as tolerated - No restrictions    Complete  by:  As directed      Diet general    Complete by:  As directed      Discharge instructions    Complete by:  As directed   Call your physician or seek immediate medical attention for confusion, fever, shortness of breath or worsening of condition.          Current Discharge Medication List    START taking these medications   Details  !! clonazePAM (KLONOPIN) 0.5 MG tablet Take 1 tablet (0.5 mg total) by mouth daily. Take in the morning. Qty: 30 tablet, Refills: 0     !! - Potential duplicate medications found. Please discuss with provider.    CONTINUE these medications which have CHANGED   Details  gabapentin (NEURONTIN) 600 MG tablet Take 1 tablet (600 mg total) by mouth 2 (two) times daily.    OLANZapine (ZYPREXA) 5 MG tablet Take 1 tablet (5 mg total) by mouth at bedtime. Qty: 30 tablet, Refills: 0      CONTINUE these medications which have NOT CHANGED   Details  abacavir (ZIAGEN) 300 MG tablet Take 2 tablets (600 mg total) by mouth daily. Qty: 60 tablet, Refills: 11    calcitRIOL (ROCALTROL) 0.25 MCG capsule Take 0.25 mcg by mouth daily.    !! clonazePAM (KLONOPIN) 2 MG tablet Take 2 mg by mouth at bedtime.    divalproex (DEPAKOTE) 250 MG DR tablet Take 500 mg by mouth at bedtime.     ergocalciferol (VITAMIN D2) 50000 UNITS capsule Take 50,000 Units by mouth once a week.    ferrous sulfate (FERROUSUL) 325 (65 FE) MG tablet Take 325 mg by mouth daily.     lamiVUDine (EPIVIR) 150 MG tablet Take 1 tablet (150 mg total) by mouth daily. Qty: 30 tablet, Refills: 11    ranitidine (ZANTAC) 150 MG tablet TAKE 1 TABLET BY MOUTH 12 HOURS AFTER TAKING  REYATAZ. Qty: 30 tablet, Refills: 3   Associated Diagnoses: GERD (gastroesophageal reflux disease)    REYATAZ 300 MG capsule TAKE (1) CAPSULE BY MOUTH ONCE DAILY WITH BREAKFAST. Qty: 30 capsule, Refills: 3    ritonavir (NORVIR) 100 MG TABS Take 100 mg by mouth daily with breakfast.    Tamsulosin HCl (FLOMAX) 0.4 MG CAPS Take 0.4 mg by mouth daily.    traZODone (DESYREL) 100 MG tablet Take 100 mg by mouth at bedtime.     !! - Potential duplicate medications found. Please discuss with provider.    STOP taking these medications     lisinopril (PRINIVIL,ZESTRIL) 5 MG tablet      lithium carbonate (ESKALITH) 450 MG CR tablet      oseltamivir (TAMIFLU) 75 MG capsule        No Known Allergies  The results of significant diagnostics from this hospitalization (including imaging, microbiology, ancillary and laboratory) are listed below for reference.    Significant Diagnostic Studies: US Renal  06/19/2014   CLINICAL DATA:  Acute renal failure, stage III chronic kidney disease, urinary retention, history HIV  EXAM: RENAL/URINARY TRACT ULTRASOUND COMPLETE  COMPARISON:  10/23/2011  FINDINGS: Exam limited by patient condition and inability to cooperate.  Right Kidney:  Length: 11.5 cm. Normal morphology without mass or hydronephrosis. No shadowing calcifications.  Left Kidney:  Length: 11.3 cm. Normal morphology without mass or hydronephrosis. No shadowing calcifications.  Bladder:  Decompressed by Foley catheter, not assessed.  IMPRESSION: No renal sonographic abnormalities identified.   Electronically Signed   By: Lavonia Dana M.D.   On: 06/19/2014  10:10   Dg Chest Port 1 View  06/20/2014   CLINICAL DATA:  Fevers  EXAM: PORTABLE CHEST - 1 VIEW  COMPARISON:  06/18/2014  FINDINGS: Cardiac shadow is within normal limits. The lungs are well aerated bilaterally. No acute bony abnormality is noted.  IMPRESSION: No active disease.   Electronically Signed   By: Inez Catalina M.D.   On: 06/20/2014 15:32    Dg Chest Port 1 View  06/18/2014   CLINICAL DATA:  54 year old male with a history of altered mental status. Abdominal pain.  EXAM: PORTABLE CHEST - 1 VIEW  COMPARISON:  05/05/2011  FINDINGS: Cardiomediastinal silhouette unchanged in size and contour.  Low lung volumes. Interstitial opacities. No confluent airspace disease. No pneumothorax or pleural effusion.  No displaced fracture.  IMPRESSION: Bilateral interstitial opacities may reflect bronchitis/ bronchopneumonia, or alternatively edema.  Signed,  Dulcy Fanny. Earleen Newport, DO  [facility]  [facility]   Electronically Signed   By: Corrie Mckusick D.O.   On: 06/18/2014 12:31    Microbiology: Recent Results (from the past 240 hour(s))  Culture, blood (single)     Status: None   Collection Time: 06/20/14  3:17 PM  Result Value Ref Range Status   Specimen Description BLOOD RIGHT ANTECUBITAL  Final   Special Requests BOTTLES DRAWN AEROBIC AND ANAEROBIC 8CC  Final   Culture NO GROWTH 5 DAYS  Final   Report Status 06/25/2014 FINAL  Final  Urine culture     Status: None   Collection Time: 06/20/14  6:23 PM  Result Value Ref Range Status   Specimen Description URINE, CATHETERIZED  Final   Special Requests NONE  Final   Colony Count NO GROWTH Performed at [facility]   Final   Culture NO GROWTH Performed at [facility]   Final   Report Status 06/21/2014 FINAL  Final     Labs: Basic Metabolic Panel:  Recent Labs Lab 06/21/14 0625 06/22/14 0631 06/23/14 0603 06/24/14 0604 06/25/14 0611  NA 153* 152* 149* 143 145  K 3.9 3.9 4.2 3.5 4.1  CL 120* 117* 115* 107 111  CO2 24 26 27 26 26   GLUCOSE 152* 166* 194* 208* 221*  BUN 26* 33* 40* 34* 30*  CREATININE 2.53* 2.54* 2.70* 2.39* 2.12*  CALCIUM 9.9 9.8 9.5 8.9 9.5   Liver Function Tests:  Recent Labs Lab 06/18/14 1121  AST 27  ALT 41  ALKPHOS 53  BILITOT 1.4*  PROT 8.8*  ALBUMIN 4.3   CBC:  Recent Labs Lab  06/18/14 1121 06/19/14 0601 06/20/14 0620 06/21/14 0625 06/23/14 0603  WBC 8.4 10.4 10.9* 13.5* 13.8*  NEUTROABS 6.0  --   --   --   --   HGB 11.7* 11.5* 11.6* 13.3 13.6  HCT 37.1* 37.5* 38.2* 43.2 43.9  MCV 94.4 95.2 95.7 95.2 95.9  PLT 183 191 223 249 231    Principal Problem:   Lithium toxicity Active Problems:   AIDS   Bipolar 1 disorder, mixed, moderate   Mental retardation   CKD (chronic kidney disease) stage 3, GFR 30-59 ml/min   Dehydration   ARF (acute renal failure)   Acute urinary retention   CAP (community acquired pneumonia)   Hypernatremia   Acute encephalopathy   Time coordinating discharge: 35 minutes  Signed:  Murray Hodgkins, MD [facility] 06/25/2014, 9:54 AM

## 2014-06-27 NOTE — Progress Notes (Signed)
UR chart review completed.  

## 2014-07-25 ENCOUNTER — Encounter: Payer: Self-pay | Admitting: Nurse Practitioner

## 2014-07-25 ENCOUNTER — Ambulatory Visit (INDEPENDENT_AMBULATORY_CARE_PROVIDER_SITE_OTHER): Payer: Medicare Other | Admitting: Nurse Practitioner

## 2014-07-25 VITALS — BP 120/71 | HR 70 | Temp 97.6°F | Ht 69.0 in | Wt 199.6 lb

## 2014-07-25 DIAGNOSIS — R1314 Dysphagia, pharyngoesophageal phase: Secondary | ICD-10-CM | POA: Diagnosis not present

## 2014-07-25 DIAGNOSIS — K219 Gastro-esophageal reflux disease without esophagitis: Secondary | ICD-10-CM | POA: Diagnosis not present

## 2014-07-25 NOTE — Assessment & Plan Note (Signed)
54 year old male with a medical history as per history of present illness. Underwent an endoscopy this year for dysphagia and GERD found to have gastritis on biopsy of the stomach. Recommended continue Zantac for symptoms. Today he states symptoms are essentially resolved with decreased heartburn symptoms, no nausea or vomiting, no regurgitation, no other red flag/warning signs or symptoms. This is corroborated by staff member who was present for the exam. Continue current medications and return to our office when necessary as needed for worsening symptoms or symptoms of increasing recurrence.

## 2014-07-25 NOTE — Progress Notes (Addendum)
Referring Provider: Shawn Stall, NP Primary Care Physician:  Shawn Stall, NP Primary GI:  Dr. Oneida Alar  Chief Complaint  Patient presents with  . Dysphagia    HPI:   54 year old male with history of HIV, schizophrenia, mental retardation who currently resides at [facility] home presents for follow-up on colonoscopy and EGD. Results of his EGD show he has gastritis per stomach biopsy. Recommended to continue Zantac, avoid items that trigger gastritis, and follow a high-fiber/low-fat diet. On colonoscopy at a single polypoid lesion removed that was benign on pathology with recommended repeat colonoscopy in 10 years with an overtube.  Today he states he doing good. When asked how his heartburn is doing, he states "I used to have a problem with that but its better. I take pills for that and it helps." Denies abdominal pain, N/V/D. Deneis hematochezia, melena. Deneis any other upper or lower GI symptoms.  Past Medical History  Diagnosis Date  . HIV (human immunodeficiency virus infection)   . Schizophrenia   . MR (mental retardation)   . Anemia   . Anxiety   . Bipolar 1 disorder   . Depression, major   . Lithium toxicity   . Hypernatremia   . Kidney failure   . Anemia     Past Surgical History  Procedure Laterality Date  . Nose surgery  1968    unsure what type of surgery. something to help him breathe better  . Colonoscopy with propofol N/A 04/12/2014    SLF:  1. one colon polyp removed 2. The left colon is extremely redundant 3. Small internal hemorrhoids  . Esophagogastroduodenoscopy (egd) with propofol N/A 04/12/2014    SLF: Stricture at the gastroesophageal junction 2. small polyp  in teh gastric body 3. Mild Non-erosive gastritis.   Azzie Almas dilation N/A 04/12/2014    Procedure: SAVORY DILATION;  Surgeon: Danie Binder, MD;  Location: [facility];  Service: Endoscopy;  Laterality: N/A;  12.8/ 14/15/16  . Polypectomy N/A 04/12/2014    Procedure: POLYPECTOMY;  Surgeon:  Danie Binder, MD;  Location: [facility];  Service: Endoscopy;  Laterality: N/A;  Cecal  . Esophageal biopsy N/A 04/12/2014    Procedure: BIOPSY;  Surgeon: Danie Binder, MD;  Location: [facility];  Service: Endoscopy;  Laterality: N/A;  Gastric    Current Outpatient Prescriptions  Medication Sig Dispense Refill  . abacavir (ZIAGEN) 300 MG tablet Take 2 tablets (600 mg total) by mouth daily. 60 tablet 11  . calcitRIOL (ROCALTROL) 0.25 MCG capsule Take 0.25 mcg by mouth daily.    . clonazePAM (KLONOPIN) 0.5 MG tablet Take 1 tablet (0.5 mg total) by mouth daily. Take in the morning. 30 tablet 0  . clonazePAM (KLONOPIN) 2 MG tablet Take 2 mg by mouth at bedtime.    . divalproex (DEPAKOTE) 250 MG DR tablet Take 500 mg by mouth at bedtime.     . ergocalciferol (VITAMIN D2) 50000 UNITS capsule Take 50,000 Units by mouth once a week.    . ferrous sulfate (FERROUSUL) 325 (65 FE) MG tablet Take 325 mg by mouth daily.     Marland Kitchen gabapentin (NEURONTIN) 600 MG tablet Take 1 tablet (600 mg total) by mouth 2 (two) times daily.    Marland Kitchen lamiVUDine (EPIVIR) 150 MG tablet Take 1 tablet (150 mg total) by mouth daily. 30 tablet 11  . OLANZapine (ZYPREXA) 5 MG tablet Take 1 tablet (5 mg total) by mouth at bedtime. 30 tablet 0  . polyethylene glycol (  MIRALAX / GLYCOLAX) packet Take 17 g by mouth daily.    . ranitidine (ZANTAC) 150 MG tablet TAKE 1 TABLET BY MOUTH 12 HOURS AFTER TAKING REYATAZ. 30 tablet 3  . REYATAZ 300 MG capsule TAKE (1) CAPSULE BY MOUTH ONCE DAILY WITH BREAKFAST. 30 capsule 3  . ritonavir (NORVIR) 100 MG TABS Take 100 mg by mouth daily with breakfast.    . Tamsulosin HCl (FLOMAX) 0.4 MG CAPS Take 0.4 mg by mouth daily.    . traZODone (DESYREL) 100 MG tablet Take 100 mg by mouth at bedtime.     No current facility-administered medications for this visit.    Allergies as of 07/25/2014  . (No Known Allergies)    Family History  Problem Relation Age of Onset  . Colon cancer Neg Hx     History    Social History  . Marital Status: Single    Spouse Name: N/A  . Number of Children: N/A  . Years of Education: N/A   Social History Main Topics  . Smoking status: Never Smoker   . Smokeless tobacco: Never Used  . Alcohol Use: No  . Drug Use: No  . Sexual Activity: No   Other Topics Concern  . None   Social History Narrative    Review of Systems: Limited by MR history, assisted by group home staff present for the office visit. General: Negative for weight loss, fever, chills, weakness. Eyes: Negative for vision changes.  ENT: Negative for hoarseness, difficulty swallowing. CV: Negative for chest pain, angina, palpitations, peripheral edema.  Respiratory: Negative for dyspnea at rest, cough, sputum, wheezing.  GI: See history of present illness. Derm: Negative for rash or itching.  Neuro: Negative for weakness, seizure.  Psych: Negative for anxiety, depression.  Endo: Negative for unusual weight change.  Heme: Negative for bruising or bleeding. Allergy: Negative for rash or hives.   Physical Exam: BP 120/71 mmHg  Pulse 70  Temp(Src) 97.6 F (36.4 C) (Oral)  Ht 5\' 9"  (1.753 m)  Wt 199 lb 9.6 oz (90.538 kg)  BMI 29.46 kg/m2 General:   Alert and oriented. No distress noted. Pleasant and cooperative.  Head:  Normocephalic and atraumatic. Eyes:  Conjuctiva clear without scleral icterus. Mouth:  Oral mucosa pink and moist. No lesions. No OP edema. Neck:  Supple, without mass or thyromegaly. Lungs:  Clear to auscultation bilaterally. No wheezes, rales, or rhonchi. No distress.  Heart:  S1, S2 present without murmurs, rubs, or gallops. Regular rate and rhythm. Abdomen:  +BS, round, soft, non-tender and non-distended. No rebound or guarding. No HSM or masses noted. Extremities:  Without edema. Neurologic:  Alert and  oriented;  Difficulty recalling some words, delayed speech consistent with MR history. Skin:  Intact without significant lesions or rashes. Psych:  Alert  and cooperative. Normal mood and affect.    07/25/2014 8:27 AM

## 2014-07-25 NOTE — Patient Instructions (Signed)
1. Continue taking your Zantac for heartburn 2. If you have a day where the heartburn feels worse, you can use TUMS 3. Avoid foods that make your heartburn worse (usually hot, spicy, acid foods) 4. Return to see [place] if your symptoms become worse or if they happen more and more frequently.

## 2014-07-25 NOTE — Progress Notes (Signed)
cc'ed to pcp °

## 2014-08-08 ENCOUNTER — Ambulatory Visit: Payer: Medicare Other | Admitting: Gastroenterology

## 2014-08-17 ENCOUNTER — Emergency Department (HOSPITAL_COMMUNITY)
Admission: EM | Admit: 2014-08-17 | Discharge: 2014-08-17 | Disposition: A | Discharge status: Home / Self Care | Payer: Medicare Other | Attending: Emergency Medicine | Admitting: Emergency Medicine

## 2014-08-17 ENCOUNTER — Encounter (HOSPITAL_COMMUNITY): Payer: Self-pay | Admitting: Emergency Medicine

## 2014-08-17 DIAGNOSIS — R4689 Other symptoms and signs involving appearance and behavior: Secondary | ICD-10-CM | POA: Diagnosis present

## 2014-08-17 DIAGNOSIS — F419 Anxiety disorder, unspecified: Secondary | ICD-10-CM | POA: Diagnosis not present

## 2014-08-17 DIAGNOSIS — Z046 Encounter for general psychiatric examination, requested by authority: Secondary | ICD-10-CM | POA: Diagnosis present

## 2014-08-17 DIAGNOSIS — F6089 Other specific personality disorders: Secondary | ICD-10-CM

## 2014-08-17 DIAGNOSIS — Z87828 Personal history of other (healed) physical injury and trauma: Secondary | ICD-10-CM | POA: Insufficient documentation

## 2014-08-17 DIAGNOSIS — Z8639 Personal history of other endocrine, nutritional and metabolic disease: Secondary | ICD-10-CM | POA: Diagnosis not present

## 2014-08-17 DIAGNOSIS — B20 Human immunodeficiency virus [HIV] disease: Secondary | ICD-10-CM | POA: Insufficient documentation

## 2014-08-17 DIAGNOSIS — F79 Unspecified intellectual disabilities: Secondary | ICD-10-CM

## 2014-08-17 DIAGNOSIS — Z79899 Other long term (current) drug therapy: Secondary | ICD-10-CM | POA: Insufficient documentation

## 2014-08-17 DIAGNOSIS — F209 Schizophrenia, unspecified: Secondary | ICD-10-CM | POA: Diagnosis not present

## 2014-08-17 DIAGNOSIS — D649 Anemia, unspecified: Secondary | ICD-10-CM | POA: Diagnosis not present

## 2014-08-17 DIAGNOSIS — F319 Bipolar disorder, unspecified: Secondary | ICD-10-CM | POA: Diagnosis not present

## 2014-08-17 DIAGNOSIS — Z87448 Personal history of other diseases of urinary system: Secondary | ICD-10-CM | POA: Diagnosis not present

## 2014-08-17 LAB — COMPREHENSIVE METABOLIC PANEL
ALT: 20 U/L (ref 17–63)
AST: 23 U/L (ref 15–41)
Albumin: 4.2 g/dL (ref 3.5–5.0)
Alkaline Phosphatase: 80 U/L (ref 38–126)
Anion gap: 5 (ref 5–15)
BUN: 17 mg/dL (ref 6–20)
CALCIUM: 9.2 mg/dL (ref 8.9–10.3)
CO2: 28 mmol/L (ref 22–32)
CREATININE: 1.65 mg/dL — AB (ref 0.61–1.24)
Chloride: 104 mmol/L (ref 101–111)
GFR, EST AFRICAN AMERICAN: 52 mL/min — AB (ref >60)
GFR, EST NON AFRICAN AMERICAN: 45 mL/min — AB (ref >60)
Glucose, Bld: 149 mg/dL — ABNORMAL HIGH (ref 70–99)
Potassium: 3.8 mmol/L (ref 3.5–5.1)
Sodium: 137 mmol/L (ref 135–145)
TOTAL PROTEIN: 8.2 g/dL — AB (ref 6.5–8.1)
Total Bilirubin: 0.7 mg/dL (ref 0.3–1.2)

## 2014-08-17 LAB — RAPID URINE DRUG SCREEN, HOSP PERFORMED
Amphetamines: NOT DETECTED
Barbiturates: NOT DETECTED
Benzodiazepines: NOT DETECTED
Cocaine: NOT DETECTED
OPIATES: NOT DETECTED
Tetrahydrocannabinol: NOT DETECTED

## 2014-08-17 LAB — CBC WITH DIFFERENTIAL/PLATELET
BASOS PCT: 0 % (ref 0–1)
Basophils Absolute: 0 10*3/uL (ref 0.0–0.1)
Eosinophils Absolute: 0.2 10*3/uL (ref 0.0–0.7)
Eosinophils Relative: 2 % (ref 0–5)
HCT: 37.3 % — ABNORMAL LOW (ref 39.0–52.0)
Hemoglobin: 11.9 g/dL — ABNORMAL LOW (ref 13.0–17.0)
Lymphocytes Relative: 22 % (ref 12–46)
Lymphs Abs: 1.7 10*3/uL (ref 0.7–4.0)
MCH: 28.9 pg (ref 26.0–34.0)
MCHC: 31.9 g/dL (ref 30.0–36.0)
MCV: 90.5 fL (ref 78.0–100.0)
Monocytes Absolute: 0.5 10*3/uL (ref 0.1–1.0)
Monocytes Relative: 7 % (ref 3–12)
NEUTROS ABS: 5.5 10*3/uL (ref 1.7–7.7)
Neutrophils Relative %: 69 % (ref 43–77)
PLATELETS: 187 10*3/uL (ref 150–400)
RBC: 4.12 MIL/uL — ABNORMAL LOW (ref 4.22–5.81)
RDW: 13.1 % (ref 11.5–15.5)
WBC: 7.8 10*3/uL (ref 4.0–10.5)

## 2014-08-17 LAB — ETHANOL

## 2014-08-17 LAB — VALPROIC ACID LEVEL: VALPROIC ACID LVL: 21 ug/mL — AB (ref 50.0–100.0)

## 2014-08-17 LAB — LITHIUM LEVEL: Lithium Lvl: <0.06 mmol/L — ABNORMAL LOW (ref 0.60–1.20)

## 2014-08-17 MED ORDER — TRAZODONE HCL 50 MG PO TABS: 100.0000 mg | ORAL_TABLET | Freq: Every day | ORAL | Status: DC

## 2014-08-17 MED ORDER — OLANZAPINE 5 MG PO TABS: 5.0000 mg | ORAL_TABLET | Freq: Every day | ORAL | Status: DC

## 2014-08-17 MED ORDER — LAMIVUDINE 150 MG PO TABS
150.0000 mg | ORAL_TABLET | Freq: Every day | ORAL | Status: DC
  Administered 2014-08-17: 150 mg via ORAL
  Filled 2014-08-17 (×2): qty 1

## 2014-08-17 MED ORDER — DIVALPROEX SODIUM 250 MG PO DR TAB: 500.0000 mg | DELAYED_RELEASE_TABLET | Freq: Every day | ORAL | Status: DC

## 2014-08-17 MED ORDER — TAMSULOSIN HCL 0.4 MG PO CAPS
0.4000 mg | ORAL_CAPSULE | Freq: Every day | ORAL | Status: DC
  Administered 2014-08-17: 0.4 mg via ORAL
  Filled 2014-08-17: qty 1

## 2014-08-17 MED ORDER — CLONAZEPAM 0.5 MG PO TABS: 2.0000 mg | ORAL_TABLET | Freq: Two times a day (BID) | ORAL | Status: DC

## 2014-08-17 MED ORDER — RITONAVIR 100 MG PO TABS
100.0000 mg | ORAL_TABLET | Freq: Every day | ORAL | Status: DC
  Administered 2014-08-17: 100 mg via ORAL
  Filled 2014-08-17: qty 1

## 2014-08-17 MED ORDER — GABAPENTIN 300 MG PO CAPS
600.0000 mg | ORAL_CAPSULE | Freq: Two times a day (BID) | ORAL | Status: DC
  Administered 2014-08-17: 600 mg via ORAL
  Filled 2014-08-17: qty 2

## 2014-08-17 MED ORDER — FAMOTIDINE 20 MG PO TABS
20.0000 mg | ORAL_TABLET | Freq: Every day | ORAL | Status: DC
  Administered 2014-08-17: 20 mg via ORAL
  Filled 2014-08-17: qty 1

## 2014-08-17 MED ORDER — ATAZANAVIR SULFATE 150 MG PO CAPS
300.0000 mg | ORAL_CAPSULE | Freq: Every day | ORAL | Status: DC
  Administered 2014-08-17: 300 mg via ORAL
  Filled 2014-08-17: qty 2

## 2014-08-17 MED ORDER — CLONAZEPAM 0.5 MG PO TABS
0.5000 mg | ORAL_TABLET | Freq: Every day | ORAL | Status: DC
  Administered 2014-08-17: 0.5 mg via ORAL
  Filled 2014-08-17: qty 1

## 2014-08-17 MED ORDER — ABACAVIR SULFATE 300 MG PO TABS
600.0000 mg | ORAL_TABLET | Freq: Every day | ORAL | Status: DC
  Administered 2014-08-17: 600 mg via ORAL
  Filled 2014-08-17 (×2): qty 2

## 2014-08-17 MED ORDER — POLYETHYLENE GLYCOL 3350 17 G PO PACK
17.0000 g | PACK | Freq: Every day | ORAL | Status: DC
  Administered 2014-08-17: 17 g via ORAL
  Filled 2014-08-17: qty 1

## 2014-08-17 MED ORDER — CLONAZEPAM 0.5 MG PO TABS: 2.0000 mg | ORAL_TABLET | Freq: Every day | ORAL | Status: DC

## 2014-08-17 NOTE — ED Notes (Signed)
Meal tray was given to patient

## 2014-08-17 NOTE — Discharge Instructions (Signed)
Follow up with out pt tx as instructed by behavior health.   Pt is medically stable,  Has been cleared by behavior health to be discharged home, and he is not a threat to himself or anyone else at this time of discharge from the emergency department

## 2014-08-17 NOTE — ED Notes (Signed)
RN called [facility] to facilitate patient discharge ride home. Per Deneise Lever, the patient is not safe to come back to facility and he was not properly cleared. RN explained to Aram Beecham that patient had face to face tele-conference with the psychiatric NP at [facility] was cleared to return to facility. Also per note made by Gerlean Ren, Gay Filler, NP spoke with someone at [facility] and they were in agreement to take patient back to facility. RN attempting to get into contact with Meagan at [facility] to speak with Trudy directly.

## 2014-08-17 NOTE — Progress Notes (Signed)
Spoke with Catalina Pizza, DNP, who states pt is recommended for discharge to return to group home [facility]) and continue psychiatric care with his current providers. States he spoke with group home staff, who are agreeable to patient returning.  Spoke with [place] charge RN regarding pt's disposition.  Sharren Bridge, MSW, [place] Clinical Social Work, Disposition  08/17/2014 [phone]
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
## 2014-08-17 NOTE — ED Notes (Signed)
Nathan [name], from [place] returned call. States he is going to try and get in touch with [facility] and he will call me back

## 2014-08-17 NOTE — ED Notes (Signed)
Ginger Ale was given to patient as well as a warm blanket

## 2014-08-17 NOTE — ED Notes (Signed)
Tele-psych machine set up at bedside for assessment

## 2014-08-17 NOTE — ED Notes (Signed)
Bedtime medications confirmed given by [facility]. Held meds ordered at [place]. EDP notified.

## 2014-08-17 NOTE — Progress Notes (Signed)
Spoke with [facility] RN who states upon calling [facility] to relay information that patient has been cleared by psychiatry for discharge and to arrange pt's transportation home, initially staff member (NP at [facility]) stated staff would be dispatched to pick patient up. Later, Deneise Lever [facility] director) called and stated pt was not going to be picked up as she felt he was a danger to the home.  CSW contacted Ms. Blackwell. She communicated that she had believed when pt was transported to ED that "she had thought he was going to be treated inpatient by psychiatrist for 72 hours." CSW explained IVC process and that pt was evaluated by psychiatry via telemedicine today, leading to recommendation of rescinding IVC as patient did not meet commitment criteria (per NP note). Ms. Philipp Ovens emphatically stated she believed "Joron needs help before he comes back here, he might need his medications changed," and CSW discussed how as pt has outpatient psychiatric services, medication changes can be discussed with that provider. Again explained criteria for commitment and stated as inpatient commitment is deemed inappropriate for this patient per psychiatry, pt has been cleared for discharge and needs transportation home.  Ms. Philipp Ovens stated she will not have patient return home "until she gets it in writing from a doctor that he is not a danger and that if Sinjin does anything it won't be on my hands." She states she wants documentation of evaluations faxed to her before she will consider transporting pt home.  Spoke with [place] and relayed content of this telephone call with Ms. Philipp Ovens. RN states she and ED will follow up with Ms. Philipp Ovens and advise her she will need to present to ED. CSW asked to be called back if any assistance can be given by this disposition staff.  Sharren Bridge, MSW, [place] Clinical Social Work, Disposition  08/17/2014 [phone]

## 2014-08-17 NOTE — ED Notes (Signed)
Patient resting with eyes closed. Respirations even and unlabored.

## 2014-08-17 NOTE — ED Provider Notes (Signed)
CSN: [id number]     Arrival date & time 08/17/14  0049 History   First MD Initiated Contact with Patient 08/17/14 0107     Chief Complaint  Patient presents with  . V70.1     HPI Pt has  Hx of HIV, schizophrenia, MR, and bipolar disorder presenting from group home with increasing agitaiton over the past several weeks. Spitting on roommate and threatening to kill staff and threatening roommate to the point where the roommate does not feel safe in the room any longer. Pt reports increasing auditory hallucinations. Feels better now that he is out of the group home   Past Medical History  Diagnosis Date  . HIV (human immunodeficiency virus infection)   . Schizophrenia   . MR (mental retardation)   . Anemia   . Anxiety   . Bipolar 1 disorder   . Depression, major   . Lithium toxicity   . Hypernatremia   . Kidney failure   . Anemia    Past Surgical History  Procedure Laterality Date  . Nose surgery  1968    unsure what type of surgery. something to help him breathe better  . Colonoscopy with propofol N/A 04/12/2014    SLF:  1. one colon polyp removed 2. The left colon is extremely redundant 3. Small internal hemorrhoids  . Esophagogastroduodenoscopy (egd) with propofol N/A 04/12/2014    SLF: Stricture at the gastroesophageal junction 2. small polyp  in teh gastric body 3. Mild Non-erosive gastritis.   Azzie Almas dilation N/A 04/12/2014    Procedure: SAVORY DILATION;  Surgeon: Danie Binder, MD;  Location: [facility];  Service: Endoscopy;  Laterality: N/A;  12.8/ 14/15/16  . Polypectomy N/A 04/12/2014    Procedure: POLYPECTOMY;  Surgeon: Danie Binder, MD;  Location: [facility];  Service: Endoscopy;  Laterality: N/A;  Cecal  . Esophageal biopsy N/A 04/12/2014    Procedure: BIOPSY;  Surgeon: Danie Binder, MD;  Location: [facility];  Service: Endoscopy;  Laterality: N/A;  Gastric   Family History  Problem Relation Age of Onset  . Colon cancer Neg Hx    History  Substance Use Topics  . Smoking  status: Never Smoker   . Smokeless tobacco: Never Used  . Alcohol Use: No    Review of Systems  All other systems reviewed and are negative.     Allergies  Review of patient's allergies indicates no known allergies.  Home Medications   Prior to Admission medications   Medication Sig Start Date End Date Taking? Authorizing Provider  abacavir (ZIAGEN) 300 MG tablet Take 2 tablets (600 mg total) by mouth daily. 09/07/12  Yes Truman Hayward, MD  calcitRIOL (ROCALTROL) 0.25 MCG capsule Take 0.25 mcg by mouth daily.   Yes Historical Provider, MD  clonazePAM (KLONOPIN) 0.5 MG tablet Take 1 tablet (0.5 mg total) by mouth daily. Take in the morning. 06/25/14  Yes Samuella Cota, MD  clonazePAM (KLONOPIN) 2 MG tablet Take 2 mg by mouth at bedtime.   Yes Historical Provider, MD  clonazePAM (KLONOPIN) 2 MG tablet Take 2 mg by mouth 2 (two) times daily.   Yes Historical Provider, MD  divalproex (DEPAKOTE) 250 MG DR tablet Take 500 mg by mouth at bedtime.    Yes Historical Provider, MD  ergocalciferol (VITAMIN D2) 50000 UNITS capsule Take 50,000 Units by mouth once a week.   Yes Historical Provider, MD  ferrous sulfate (FERROUSUL) 325 (65 FE) MG tablet Take 325 mg by mouth daily.  Yes Historical Provider, MD  gabapentin (NEURONTIN) 600 MG tablet Take 1 tablet (600 mg total) by mouth 2 (two) times daily. 06/25/14  Yes Samuella Cota, MD  lamiVUDine (EPIVIR) 150 MG tablet Take 1 tablet (150 mg total) by mouth daily. 07/22/12  Yes Truman Hayward, MD  OLANZapine (ZYPREXA) 5 MG tablet Take 1 tablet (5 mg total) by mouth at bedtime. 06/25/14  Yes Samuella Cota, MD  polyethylene glycol [facility]) packet Take 17 g by mouth daily.   Yes Historical Provider, MD  ranitidine (ZANTAC) 150 MG tablet TAKE 1 TABLET BY MOUTH 12 HOURS AFTER TAKING REYATAZ. 10/31/12  Yes Truman Hayward, MD  REYATAZ 300 MG capsule TAKE (1) CAPSULE BY MOUTH ONCE DAILY WITH BREAKFAST.   Yes Truman Hayward, MD  ritonavir (NORVIR) 100 MG TABS Take 100 mg by mouth daily with breakfast.   Yes Historical Provider, MD  Tamsulosin HCl (FLOMAX) 0.4 MG CAPS Take 0.4 mg by mouth daily.   Yes Historical Provider, MD  traZODone (DESYREL) 100 MG tablet Take 100 mg by mouth at bedtime.   Yes Historical Provider, MD   There were no vitals taken for this visit. Physical Exam  Constitutional: He is oriented to person, place, and time. He appears well-developed and well-nourished.  HENT:  Head: Normocephalic and atraumatic.  Eyes: EOM are normal.  Neck: Normal range of motion.  Cardiovascular: Normal rate, regular rhythm, normal heart sounds and intact distal pulses.   Pulmonary/Chest: Effort normal and breath sounds normal. No respiratory distress.  Abdominal: Soft. He exhibits no distension. There is no tenderness.  Musculoskeletal: Normal range of motion.  Neurological: He is alert and oriented to person, place, and time.  Skin: Skin is warm and dry.  Psychiatric: He has a normal mood and affect. Judgment normal.  Nursing note and vitals reviewed.   ED Course  Procedures (including critical care time) Labs Review Labs Reviewed  CBC WITH DIFFERENTIAL/PLATELET  COMPREHENSIVE METABOLIC PANEL  URINE RAPID DRUG SCREEN (HOSP PERFORMED)  ETHANOL  VALPROIC ACID LEVEL    Imaging Review No results found.   EKG Interpretation None      MDM   Final diagnoses:  None    Calm and cooperative in ER. Increasing agitation at group home. Will ask TTS to evaluate. Pt reports increasing auditory hallucinations    Jola Schmidt, MD 08/17/14 [phone]

## 2014-08-17 NOTE — ED Notes (Signed)
Awaiting medication from pharmacy. Will administer when available.

## 2014-08-17 NOTE — ED Notes (Signed)
Awaiting [facility] to arrive to discharge patient back to facility.

## 2014-08-17 NOTE — BH Assessment (Signed)
Tele Assessment Note   Nathan [name] is a 54 y.o. male who presents to [facility] via IVC petition, initiated by mobile crisis.  This Probation officer attempted to interview pt, however he was drowsy.  Pt managed to engage minimally with this Probation officer.  Pt stated that he is hearing voices w/command but he doesn't have a plan or intent to harm anyone.  Pt says he hits himself in the head to try to remove the demons. The following information is collateral, from petition and hospital notes:  Pt lives in [facility] group home and is experiencing increased agitation over the past several weeks.  He is spitting on his roommate and is threatening to kill staff and threatening roommate to the point where the roommate doesn't feel safe in the room any longer. Pt stated that he felt better being out of the group home.  Pt is throwing chairs and "zero impulse control".     Axis I: Schizophrenia Axis II: Mental retardation, severity unknown Axis III:  Past Medical History  Diagnosis Date  . HIV (human immunodeficiency virus infection)   . Schizophrenia   . MR (mental retardation)   . Anemia   . Anxiety   . Bipolar 1 disorder   . Depression, major   . Lithium toxicity   . Hypernatremia   . Kidney failure   . Anemia    Axis IV: housing problems, other psychosocial or environmental problems, problems related to social environment and problems with primary support group Axis V: 21-30 behavior considerably influenced by delusions or hallucinations OR serious impairment in judgment, communication OR inability to function in almost all areas  Past Medical History:  Past Medical History  Diagnosis Date  . HIV (human immunodeficiency virus infection)   . Schizophrenia   . MR (mental retardation)   . Anemia   . Anxiety   . Bipolar 1 disorder   . Depression, major   . Lithium toxicity   . Hypernatremia   . Kidney failure   . Anemia     Past Surgical History  Procedure Laterality Date  . Nose surgery  1968     unsure what type of surgery. something to help him breathe better  . Colonoscopy with propofol N/A 04/12/2014    SLF:  1. one colon polyp removed 2. The left colon is extremely redundant 3. Small internal hemorrhoids  . Esophagogastroduodenoscopy (egd) with propofol N/A 04/12/2014    SLF: Stricture at the gastroesophageal junction 2. small polyp  in teh gastric body 3. Mild Non-erosive gastritis.   Azzie Almas dilation N/A 04/12/2014    Procedure: SAVORY DILATION;  Surgeon: Danie Binder, MD;  Location: [facility];  Service: Endoscopy;  Laterality: N/A;  12.8/ 14/15/16  . Polypectomy N/A 04/12/2014    Procedure: POLYPECTOMY;  Surgeon: Danie Binder, MD;  Location: [facility];  Service: Endoscopy;  Laterality: N/A;  Cecal  . Esophageal biopsy N/A 04/12/2014    Procedure: BIOPSY;  Surgeon: Danie Binder, MD;  Location: [facility];  Service: Endoscopy;  Laterality: N/A;  Gastric    Family History:  Family History  Problem Relation Age of Onset  . Colon cancer Neg Hx     Social History:  reports that he has never smoked. He has never used smokeless tobacco. He reports that he does not drink alcohol or use illicit drugs.  Additional Social History:  Alcohol / Drug Use Pain Medications: See MAR  Prescriptions: See MAR  Over the Counter: See MAR  History  of alcohol / drug use?: No history of alcohol / drug abuse Longest period of sobriety (when/how long): None   CIWA: CIWA-Ar BP: 130/67 mmHg Pulse Rate: 68 COWS:    PATIENT STRENGTHS: (choose at least two) Supportive family/friends  Allergies: No Known Allergies  Home Medications:  (Not in a hospital admission)  OB/GYN Status:  No LMP for male patient.  General Assessment Data Location of Assessment: [facility] TTS Assessment: In system Is this a Tele or Face-to-Face Assessment?: Tele Assessment Is this an Initial Assessment or a Re-assessment for this encounter?: Initial Assessment Marital status: Single Maiden name: None  Is patient pregnant?:  No Pregnancy Status: No Living Arrangements: Group Home [facility] Can pt return to current living arrangement?: Yes Admission Status: Involuntary Is patient capable of signing voluntary admission?: No Referral Source: MD Insurance type: [place] Screening Exam ([place]) Medical Exam completed: No Reason for MSE not completed: Other: (None )  Crisis Care Plan Living Arrangements: Group Home [facility] ) Name of Psychiatrist: None  Name of Therapist: None   Education Status Is patient currently in school?: No Current Grade: None  Highest grade of school patient has completed: None  Name of school: None  Contact person: None   Risk to self with the past 6 months Suicidal Ideation: No Has patient been a risk to self within the past 6 months prior to admission? : No Suicidal Intent: No Has patient had any suicidal intent within the past 6 months prior to admission? : No Is patient at risk for suicide?: No Suicidal Plan?: No Has patient had any suicidal plan within the past 6 months prior to admission? : No Access to Means: No What has been your use of drugs/alcohol within the last 12 months?: None  Previous Attempts/Gestures: No How many times?: 0 Other Self Harm Risks: None  Triggers for Past Attempts: None known Intentional Self Injurious Behavior: None Family Suicide History: No Recent stressful life event(s): Conflict (Comment) (Issues with group home staff ) Persecutory voices/beliefs?: No Depression: No Depression Symptoms:  (None reported ) Substance abuse history and/or treatment for substance abuse?: No Suicide prevention information given to non-admitted patients: Not applicable  Risk to Others within the past 6 months Homicidal Ideation: No Does patient have any lifetime risk of violence toward others beyond the six months prior to admission? : No Thoughts of Harm to Others: No Current Homicidal Intent: No Current Homicidal Plan:  No Access to Homicidal Means: No Identified Victim: nONE  History of harm to others?: No Assessment of Violence: None Noted Violent Behavior Description: None  Does patient have access to weapons?: No Criminal Charges Pending?: No Does patient have a court date: No Is patient on probation?: No  Psychosis Hallucinations: None noted Delusions: None noted  Mental Status Report Appearance/Hygiene: In scrubs Eye Contact: Fair Motor Activity: Unremarkable Speech: Other (Comment), Slurred (Pt has a slight speech impediment ) Level of Consciousness: Drowsy Mood: Other (Comment) (Appropriate ) Affect: Appropriate to circumstance Anxiety Level: None Thought Processes: Relevant Judgement: Impaired Orientation: Person, Place, Situation Obsessive Compulsive Thoughts/Behaviors: None  Cognitive Functioning Concentration: Normal Memory: Recent Intact, Remote Intact IQ: Average Insight: Fair Impulse Control: Fair Appetite: Good Weight Loss: 0 Weight Gain: 0 Sleep: Unable to Assess Total Hours of Sleep:  (Unk ) Vegetative Symptoms: None  ADLScreening [facility] Assessment Services) Patient's cognitive ability adequate to safely complete daily activities?: Yes Patient able to express need for assistance with ADLs?: Yes Independently performs  ADLs?: Yes (appropriate for developmental age)  Prior Inpatient Therapy Prior Inpatient Therapy: No Prior Therapy Dates: None  Prior Therapy Facilty/Provider(s): None  Reason for Treatment: None   Prior Outpatient Therapy Prior Outpatient Therapy: No Prior Therapy Dates: None  Prior Therapy Facilty/Provider(s): None  Reason for Treatment: None  Does patient have an ACCT team?: No Does patient have Intensive In-House Services?  : No Does patient have [facility] services? : No Does patient have P4CC services?: No  ADL Screening (condition at time of admission) Patient's cognitive ability adequate to safely complete daily activities?: Yes Is the  patient deaf or have difficulty hearing?: No Does the patient have difficulty seeing, even when wearing glasses/contacts?: No Does the patient have difficulty concentrating, remembering, or making decisions?: Yes Patient able to express need for assistance with ADLs?: Yes Does the patient have difficulty dressing or bathing?: No Independently performs ADLs?: Yes (appropriate for developmental age) Weakness of Legs: None Weakness of Arms/Hands: None  Home Assistive Devices/Equipment Home Assistive Devices/Equipment: None  Therapy Consults (therapy consults require a physician order) PT Evaluation Needed: No OT Evalulation Needed: No SLP Evaluation Needed: No Abuse/Neglect Assessment (Assessment to be complete while patient is alone) Physical Abuse: Denies Verbal Abuse: Denies Sexual Abuse: Denies Exploitation of patient/patient's resources: Denies Self-Neglect: Denies Values / Beliefs Cultural Requests During Hospitalization: None Spiritual Requests During Hospitalization: None Consults Spiritual Care Consult Needed: No Social Work Consult Needed: No Regulatory affairs officer (For Healthcare) Does patient have an advance directive?: No Would patient like information on creating an advanced directive?: No - patient declined information    Additional Information 1:1 In Past 12 Months?: No CIRT Risk: No Elopement Risk: No Does patient have medical clearance?: Yes     Disposition:  Disposition Disposition of Patient: Referred to (Per Patriciaann Clan, PA AM psych eval for final ) Patient referred to: Other (Comment) (Per Patriciaann Clan, PA, AM psych eval for final disposition )  Girtha Rm 08/17/2014 3:58 AM
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
## 2014-08-17 NOTE — ED Notes (Signed)
Patient ambulatory to restroom. Steady gait. Sitter at side.

## 2014-08-19 ENCOUNTER — Emergency Department (HOSPITAL_COMMUNITY)
Admission: EM | Admit: 2014-08-19 | Discharge: 2014-08-19 | Disposition: A | Discharge status: Home / Self Care | Payer: Medicare Other | Attending: Emergency Medicine | Admitting: Emergency Medicine

## 2014-08-19 ENCOUNTER — Encounter (HOSPITAL_COMMUNITY): Payer: Self-pay

## 2014-08-19 DIAGNOSIS — M79605 Pain in left leg: Secondary | ICD-10-CM | POA: Diagnosis present

## 2014-08-19 DIAGNOSIS — Z79899 Other long term (current) drug therapy: Secondary | ICD-10-CM | POA: Diagnosis not present

## 2014-08-19 DIAGNOSIS — F419 Anxiety disorder, unspecified: Secondary | ICD-10-CM | POA: Diagnosis not present

## 2014-08-19 DIAGNOSIS — M25562 Pain in left knee: Secondary | ICD-10-CM | POA: Diagnosis not present

## 2014-08-19 DIAGNOSIS — Z862 Personal history of diseases of the blood and blood-forming organs and certain disorders involving the immune mechanism: Secondary | ICD-10-CM | POA: Diagnosis not present

## 2014-08-19 DIAGNOSIS — Z21 Asymptomatic human immunodeficiency virus [HIV] infection status: Secondary | ICD-10-CM | POA: Insufficient documentation

## 2014-08-19 DIAGNOSIS — F209 Schizophrenia, unspecified: Secondary | ICD-10-CM | POA: Diagnosis not present

## 2014-08-19 DIAGNOSIS — Z87448 Personal history of other diseases of urinary system: Secondary | ICD-10-CM | POA: Insufficient documentation

## 2014-08-19 DIAGNOSIS — F329 Major depressive disorder, single episode, unspecified: Secondary | ICD-10-CM | POA: Insufficient documentation

## 2014-08-19 DIAGNOSIS — F79 Unspecified intellectual disabilities: Secondary | ICD-10-CM | POA: Insufficient documentation

## 2014-08-19 DIAGNOSIS — Z8639 Personal history of other endocrine, nutritional and metabolic disease: Secondary | ICD-10-CM | POA: Insufficient documentation

## 2014-08-19 DIAGNOSIS — M25561 Pain in right knee: Secondary | ICD-10-CM | POA: Diagnosis not present

## 2014-08-19 NOTE — ED Provider Notes (Signed)
CSN: [id number]     Arrival date & time 08/19/14  1746 History   First MD Initiated Contact with Patient 08/19/14 1759     Chief Complaint  Patient presents with  . Leg Pain     (Consider location/radiation/quality/duration/timing/severity/associated sxs/prior Treatment) HPI  Patient presents with concern of bilateral knee pain. He states that he has had pain for a long time, but became more concerned about it recently. No identified precipitant. Over the past day he has noticed more substantial pain in each knee, particularly with motion, ambulation. No recent fall, trauma, car accident. Patient has not taken any medication for relief, nor has used ice packs, heating packs. No distal loss of sensation, no proximal pain. Patient lives at a group home, denies other concurrent complaints, issues.   Past Medical History  Diagnosis Date  . HIV (human immunodeficiency virus infection)   . Schizophrenia   . MR (mental retardation)   . Anemia   . Anxiety   . Bipolar 1 disorder   . Depression, major   . Lithium toxicity   . Hypernatremia   . Kidney failure   . Anemia    Past Surgical History  Procedure Laterality Date  . Nose surgery  1968    unsure what type of surgery. something to help him breathe better  . Colonoscopy with propofol N/A 04/12/2014    SLF:  1. one colon polyp removed 2. The left colon is extremely redundant 3. Small internal hemorrhoids  . Esophagogastroduodenoscopy (egd) with propofol N/A 04/12/2014    SLF: Stricture at the gastroesophageal junction 2. small polyp  in teh gastric body 3. Mild Non-erosive gastritis.   Azzie Almas dilation N/A 04/12/2014    Procedure: SAVORY DILATION;  Surgeon: Danie Binder, MD;  Location: [facility];  Service: Endoscopy;  Laterality: N/A;  12.8/ 14/15/16  . Polypectomy N/A 04/12/2014    Procedure: POLYPECTOMY;  Surgeon: Danie Binder, MD;  Location: [facility];  Service: Endoscopy;  Laterality: N/A;  Cecal  . Esophageal biopsy N/A 04/12/2014     Procedure: BIOPSY;  Surgeon: Danie Binder, MD;  Location: [facility];  Service: Endoscopy;  Laterality: N/A;  Gastric   Family History  Problem Relation Age of Onset  . Colon cancer Neg Hx    History  Substance Use Topics  . Smoking status: Never Smoker   . Smokeless tobacco: Never Used  . Alcohol Use: No    Review of Systems  Gastrointestinal: Negative for nausea.  Musculoskeletal: Positive for arthralgias.  Skin: Negative for wound.  Allergic/Immunologic: Positive for immunocompromised state.  Neurological: Negative for weakness.      Allergies  Review of patient's allergies indicates no known allergies.  Home Medications   Prior to Admission medications   Medication Sig Start Date End Date Taking? Authorizing Provider  abacavir (ZIAGEN) 300 MG tablet Take 2 tablets (600 mg total) by mouth daily. 09/07/12   Truman Hayward, MD  calcitRIOL (ROCALTROL) 0.25 MCG capsule Take 0.25 mcg by mouth daily.    Historical Provider, MD  clonazePAM (KLONOPIN) 0.5 MG tablet Take 1 tablet (0.5 mg total) by mouth daily. Take in the morning. 06/25/14   Samuella Cota, MD  clonazePAM (KLONOPIN) 2 MG tablet Take 2 mg by mouth at bedtime.    Historical Provider, MD  clonazePAM (KLONOPIN) 2 MG tablet Take 2 mg by mouth at bedtime.    Historical Provider, MD  divalproex (DEPAKOTE) 250 MG DR tablet Take 500 mg by mouth at  bedtime.     Historical Provider, MD  ergocalciferol (VITAMIN D2) 50000 UNITS capsule Take 50,000 Units by mouth once a week.    Historical Provider, MD  ferrous sulfate (FERROUSUL) 325 (65 FE) MG tablet Take 325 mg by mouth 3 (three) times daily with meals.     Historical Provider, MD  gabapentin (NEURONTIN) 600 MG tablet Take 1 tablet (600 mg total) by mouth 2 (two) times daily. 06/25/14   Samuella Cota, MD  lamiVUDine (EPIVIR) 150 MG tablet Take 1 tablet (150 mg total) by mouth daily. 07/22/12   Truman Hayward, MD  OLANZapine (ZYPREXA) 5 MG tablet Take 1 tablet (5  mg total) by mouth at bedtime. 06/25/14   Samuella Cota, MD  polyethylene glycol [facility] Floria Raveling) packet Take 17 g by mouth daily as needed for mild constipation or moderate constipation.     Historical Provider, MD  ranitidine (ZANTAC) 150 MG tablet TAKE 1 TABLET BY MOUTH 12 HOURS AFTER TAKING REYATAZ. 10/31/12   Truman Hayward, MD  REYATAZ 300 MG capsule TAKE (1) CAPSULE BY MOUTH ONCE DAILY WITH BREAKFAST.    Truman Hayward, MD  ritonavir (NORVIR) 100 MG TABS Take 100 mg by mouth daily with breakfast.    Historical Provider, MD  Tamsulosin HCl (FLOMAX) 0.4 MG CAPS Take 0.4 mg by mouth daily.    Historical Provider, MD  traZODone (DESYREL) 100 MG tablet Take 100 mg by mouth at bedtime.    Historical Provider, MD   BP 132/62 mmHg  Pulse 95  Temp(Src) 98.9 F (37.2 C) (Oral)  Resp 16  Ht 5\' 9"  (1.753 m)  SpO2 94% Physical Exam  Constitutional: He is oriented to person, place, and time. He appears well-developed. No distress.  HENT:  Head: Normocephalic and atraumatic.  Eyes: Conjunctivae and EOM are normal.  Cardiovascular: Normal rate and regular rhythm.   Pulmonary/Chest: Effort normal. No stridor. No respiratory distress.  Abdominal: He exhibits no distension.  Musculoskeletal: He exhibits no edema.  Mild tenderness to palpation diffusely about both knees, with no crepitus, deformity, laxity of either. Both hips, ankles unremarkable.  Neurological: He is alert and oriented to person, place, and time. He displays no atrophy and no tremor. No sensory deficit. He exhibits normal muscle tone. He displays no seizure activity. Coordination normal.  Skin: Skin is warm and dry.  Psychiatric: His speech is delayed. He is slowed and withdrawn. Cognition and memory are impaired.  Nursing note and vitals reviewed.   ED Course  Procedures (including critical care time)   MDM   Final diagnoses:  Arthralgia of both knees    Patient presents with atraumatic ongoing pain in  each knee. Patient is distally neurovascularly intact, in no distress, awake, alert. Patient's knee pain is likely musculoskeletal, with low suspicion for occult infection. Patient discharged after initiating course of pain therapy to his group home.    Carmin Muskrat, MD 08/19/14 [phone]

## 2014-08-19 NOTE — ED Notes (Signed)
Bought in by EMS, per them nursing staff stated patient has been C/o of bilateral leg pain, beginning this am, which is new

## 2014-08-19 NOTE — ED Notes (Signed)
Pt moved to hallway in a wheelchair awaiting ems to take back to [facility]

## 2014-08-19 NOTE — Discharge Instructions (Signed)
As discussed, your evaluation today has been largely reassuring.  But, it is important that you monitor your condition carefully, and do not hesitate to return to the ED if you develop new, or concerning changes in your condition.  Your knee pain is likely due to inflammatory changes.  For the next 3 days, please take 600 mg ibuprofen, 3 times daily.  In addition, please use ice packs, 3 times daily during this timeframe.

## 2014-08-22 ENCOUNTER — Telehealth: Payer: Self-pay | Admitting: Orthopedic Surgery

## 2014-08-22 NOTE — Telephone Encounter (Signed)
Call received from [facility] facility, ph# [phone], fax [phone] - requesting appointment for patient for problem arthralgia both knees, Called back, left message with Marlou Sa - will need notes faxed and will need Xrays prior to appointment at [facility].  (Relay to [employer] when call is returned)

## 2014-08-23 NOTE — Telephone Encounter (Signed)
[name] returned calll - relayed patient was initially seen at [facility] Emergency Room; states has no notes, due to their facility being assisted living. Scheduled appointment with order done for Xrays prior to the visit.

## 2014-09-15 ENCOUNTER — Other Ambulatory Visit: Payer: Self-pay | Admitting: Orthopedic Surgery

## 2014-09-15 ENCOUNTER — Ambulatory Visit: Payer: Medicare Other | Admitting: Orthopedic Surgery

## 2014-09-15 ENCOUNTER — Ambulatory Visit (HOSPITAL_COMMUNITY)
Admission: RE | Admit: 2014-09-15 | Discharge: 2014-09-15 | Disposition: A | Discharge status: Home / Self Care | Payer: Medicare Other | Source: Ambulatory Visit | Attending: Orthopedic Surgery | Admitting: Orthopedic Surgery

## 2014-09-15 DIAGNOSIS — M25561 Pain in right knee: Secondary | ICD-10-CM | POA: Insufficient documentation

## 2014-09-15 DIAGNOSIS — M25562 Pain in left knee: Secondary | ICD-10-CM | POA: Insufficient documentation

## 2014-09-15 DIAGNOSIS — R52 Pain, unspecified: Secondary | ICD-10-CM

## 2014-09-15 DIAGNOSIS — M549 Dorsalgia, unspecified: Secondary | ICD-10-CM | POA: Diagnosis not present

## 2014-09-22 ENCOUNTER — Other Ambulatory Visit: Payer: Self-pay | Admitting: Psychiatry

## 2014-09-22 ENCOUNTER — Emergency Department (HOSPITAL_COMMUNITY)
Admission: EM | Admit: 2014-09-22 | Discharge: 2014-09-22 | Disposition: A | Discharge status: Home / Self Care | Payer: Medicare Other | Attending: Emergency Medicine | Admitting: Emergency Medicine

## 2014-09-22 ENCOUNTER — Encounter (HOSPITAL_COMMUNITY): Payer: Self-pay | Admitting: Cardiology

## 2014-09-22 DIAGNOSIS — F203 Undifferentiated schizophrenia: Secondary | ICD-10-CM | POA: Insufficient documentation

## 2014-09-22 DIAGNOSIS — F79 Unspecified intellectual disabilities: Secondary | ICD-10-CM | POA: Insufficient documentation

## 2014-09-22 DIAGNOSIS — Z87828 Personal history of other (healed) physical injury and trauma: Secondary | ICD-10-CM | POA: Insufficient documentation

## 2014-09-22 DIAGNOSIS — B20 Human immunodeficiency virus [HIV] disease: Secondary | ICD-10-CM | POA: Insufficient documentation

## 2014-09-22 DIAGNOSIS — F319 Bipolar disorder, unspecified: Secondary | ICD-10-CM | POA: Diagnosis not present

## 2014-09-22 DIAGNOSIS — D649 Anemia, unspecified: Secondary | ICD-10-CM | POA: Insufficient documentation

## 2014-09-22 DIAGNOSIS — F419 Anxiety disorder, unspecified: Secondary | ICD-10-CM | POA: Insufficient documentation

## 2014-09-22 DIAGNOSIS — Z79899 Other long term (current) drug therapy: Secondary | ICD-10-CM | POA: Diagnosis not present

## 2014-09-22 DIAGNOSIS — Z87448 Personal history of other diseases of urinary system: Secondary | ICD-10-CM | POA: Diagnosis not present

## 2014-09-22 DIAGNOSIS — R4182 Altered mental status, unspecified: Secondary | ICD-10-CM | POA: Diagnosis present

## 2014-09-22 LAB — CBC WITH DIFFERENTIAL/PLATELET
BASOS PCT: 0 % (ref 0–1)
Basophils Absolute: 0 10*3/uL (ref 0.0–0.1)
EOS ABS: 0.2 10*3/uL (ref 0.0–0.7)
Eosinophils Relative: 2 % (ref 0–5)
HCT: 36.8 % — ABNORMAL LOW (ref 39.0–52.0)
Hemoglobin: 11.8 g/dL — ABNORMAL LOW (ref 13.0–17.0)
LYMPHS PCT: 19 % (ref 12–46)
Lymphs Abs: 1.4 10*3/uL (ref 0.7–4.0)
MCH: 29 pg (ref 26.0–34.0)
MCHC: 32.1 g/dL (ref 30.0–36.0)
MCV: 90.4 fL (ref 78.0–100.0)
MONO ABS: 0.7 10*3/uL (ref 0.1–1.0)
Monocytes Relative: 9 % (ref 3–12)
NEUTROS ABS: 5.3 10*3/uL (ref 1.7–7.7)
NEUTROS PCT: 70 % (ref 43–77)
PLATELETS: 169 10*3/uL (ref 150–400)
RBC: 4.07 MIL/uL — ABNORMAL LOW (ref 4.22–5.81)
RDW: 13 % (ref 11.5–15.5)
WBC: 7.6 10*3/uL (ref 4.0–10.5)

## 2014-09-22 LAB — BASIC METABOLIC PANEL
Anion gap: 8 (ref 5–15)
BUN: 13 mg/dL (ref 6–20)
CALCIUM: 9.2 mg/dL (ref 8.9–10.3)
CO2: 30 mmol/L (ref 22–32)
Chloride: 100 mmol/L — ABNORMAL LOW (ref 101–111)
Creatinine, Ser: 1.55 mg/dL — ABNORMAL HIGH (ref 0.61–1.24)
GFR calc Af Amer: 57 mL/min — ABNORMAL LOW (ref >60)
GFR, EST NON AFRICAN AMERICAN: 49 mL/min — AB (ref >60)
Glucose, Bld: 159 mg/dL — ABNORMAL HIGH (ref 65–99)
Potassium: 3.4 mmol/L — ABNORMAL LOW (ref 3.5–5.1)
SODIUM: 138 mmol/L (ref 135–145)

## 2014-09-22 LAB — ETHANOL

## 2014-09-22 NOTE — Discharge Instructions (Signed)
Return as needed
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
## 2014-09-22 NOTE — ED Notes (Signed)
Patient up to bathroom. Ambulated with no assistance or difficulty.
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
## 2014-09-22 NOTE — ED Notes (Signed)
Call from [place] with [facility], states patient is able to be discharged back to his home facility when medically cleared. States Dr Dewayne Hatch has been made aware.

## 2014-09-22 NOTE — ED Notes (Signed)
[place] and spoke with Nira Retort who stated "I was told by my supervisor that when ya'll call to give you her name and number to call her. We did agree to receive him back at our facility because we can not refuse him, but I do not want to go over my supervisors head." Called supervisor Deneise Lever, number [phone], no answer and not able to leave message. Called Lashanda back and she stated she would call me "right back."
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
## 2014-09-22 NOTE — ED Notes (Signed)
Gave patient ginger ale to drink as requested.

## 2014-09-22 NOTE — ED Notes (Addendum)
Pt states he is confused.  Pt not making any sense when he talks.  Resident at [facility].
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
## 2014-09-27 ENCOUNTER — Ambulatory Visit (INDEPENDENT_AMBULATORY_CARE_PROVIDER_SITE_OTHER): Payer: Medicare Other | Admitting: Orthopedic Surgery

## 2014-09-27 ENCOUNTER — Encounter: Payer: Self-pay | Admitting: Orthopedic Surgery

## 2014-09-27 VITALS — BP 140/81 | Ht 69.0 in | Wt 190.0 lb

## 2014-09-27 DIAGNOSIS — M25562 Pain in left knee: Secondary | ICD-10-CM

## 2014-09-27 DIAGNOSIS — M25561 Pain in right knee: Secondary | ICD-10-CM

## 2014-09-27 MED ORDER — NAPROXEN 500 MG PO TABS: 500.0000 mg | ORAL_TABLET | Freq: Two times a day (BID) | ORAL | Status: DC

## 2014-09-27 NOTE — Progress Notes (Signed)
Patient ID: Nathan [name], male   DOB: May 29, 1960, 54 y.o.   MRN: [id number] Chief Complaint  Patient presents with  . Follow-up    er follow up bilateral knee arthralgia    This patient has history of HIV positive schizophrenia anemia mental retardation anxiety disorder bipolar disorder major depression presents for evaluation of bilateral knee pain left worse than right associated with left knee joint effusion which is worse in the morning and after activity and associated with 8 out of 10 pain. The patient lives at a adult home care facility so I am not sure how well his history forms were completed there is no other history to go by.  Review of systems burning pain in his legs joint pain stiffness joints swollen joints stable listed everything else is normal.  He has no surgery history Jeani Hawking listed his only medical problem is mental illness  He has no allergies. He has a family history of mental illness  He's disabled doesn't smoke or drink  BP 140/81 mmHg  Ht 5\' 9"  (1.753 m)  Wt 190 lb (86.183 kg)  BMI 28.05 kg/m2 Overall appearance is grooming and hygiene are normal. He appears to be oriented to person place and time. His mood is pleasant.  He has an altered gait he favors his left leg  His left leg has knee effusion no restriction in motion stable motor exam normal  Right knee full range of motion no effusion no tenderness stable normal motor exam  Skin in both legs normal. Distal neurovascular function intact  X-ray of the knee right and left was done at the hospital prior to his visit 3 views of each knee I have reviewed the report I basically don't see any abnormalities here.  Impression left knee effusion, left knee pain  Recommend bilateral knee braces naproxen 500 mg twice a day  This patient is not a surgical candidate unless he has a fracture or major ligament injury.

## 2014-09-28 ENCOUNTER — Telehealth: Payer: Self-pay | Admitting: *Deleted

## 2014-09-28 NOTE — Telephone Encounter (Signed)
JILL AT [facility] CALLED AND STATES THAT THEY DO NOT HAVE FULL ROM KNEE BRACES, THEY HAVE HINGED KNEE BRACES, WILL THAT BE OKAY

## 2014-09-29 NOTE — Telephone Encounter (Signed)
Its the same thing

## 2014-09-29 NOTE — Telephone Encounter (Signed)
[place]

## 2014-10-31 ENCOUNTER — Other Ambulatory Visit: Payer: Medicare Other

## 2014-10-31 ENCOUNTER — Other Ambulatory Visit (HOSPITAL_COMMUNITY)
Admission: RE | Admit: 2014-10-31 | Discharge: 2014-10-31 | Disposition: A | Discharge status: Home / Self Care | Payer: Medicare Other | Source: Ambulatory Visit | Attending: Infectious Disease | Admitting: Infectious Disease

## 2014-10-31 DIAGNOSIS — Z113 Encounter for screening for infections with a predominantly sexual mode of transmission: Secondary | ICD-10-CM | POA: Insufficient documentation

## 2014-10-31 DIAGNOSIS — B20 Human immunodeficiency virus [HIV] disease: Secondary | ICD-10-CM

## 2014-10-31 LAB — CBC WITH DIFFERENTIAL/PLATELET
BASOS PCT: 0 % (ref 0–1)
Basophils Absolute: 0 10*3/uL (ref 0.0–0.1)
Eosinophils Absolute: 0.2 10*3/uL (ref 0.0–0.7)
Eosinophils Relative: 2 % (ref 0–5)
HEMATOCRIT: 37.4 % — AB (ref 39.0–52.0)
Hemoglobin: 12.5 g/dL — ABNORMAL LOW (ref 13.0–17.0)
Lymphocytes Relative: 38 % (ref 12–46)
Lymphs Abs: 2.9 10*3/uL (ref 0.7–4.0)
MCH: 29.1 pg (ref 26.0–34.0)
MCHC: 33.4 g/dL (ref 30.0–36.0)
MCV: 87 fL (ref 78.0–100.0)
MONO ABS: 0.5 10*3/uL (ref 0.1–1.0)
MPV: 12.2 fL (ref 8.6–12.4)
Monocytes Relative: 7 % (ref 3–12)
Neutro Abs: 4 10*3/uL (ref 1.7–7.7)
Neutrophils Relative %: 53 % (ref 43–77)
PLATELETS: 157 10*3/uL (ref 150–400)
RBC: 4.3 MIL/uL (ref 4.22–5.81)
RDW: 15.5 % (ref 11.5–15.5)
WBC: 7.6 10*3/uL (ref 4.0–10.5)

## 2014-10-31 LAB — COMPLETE METABOLIC PANEL WITH GFR
ALT: 19 U/L (ref 9–46)
AST: 17 U/L (ref 10–35)
Albumin: 4.6 g/dL (ref 3.6–5.1)
Alkaline Phosphatase: 54 U/L (ref 40–115)
BILIRUBIN TOTAL: 0.9 mg/dL (ref 0.2–1.2)
BUN: 14 mg/dL (ref 7–25)
CO2: 27 mmol/L (ref 20–31)
Calcium: 9.5 mg/dL (ref 8.6–10.3)
Chloride: 103 mmol/L (ref 98–110)
Creat: 1.67 mg/dL — ABNORMAL HIGH (ref 0.70–1.33)
GFR, EST AFRICAN AMERICAN: 52 mL/min — AB (ref >=60)
GFR, EST NON AFRICAN AMERICAN: 45 mL/min — AB (ref >=60)
Glucose, Bld: 146 mg/dL — ABNORMAL HIGH (ref 65–99)
POTASSIUM: 3.9 mmol/L (ref 3.5–5.3)
Sodium: 141 mmol/L (ref 135–146)
Total Protein: 7.5 g/dL (ref 6.1–8.1)

## 2014-10-31 LAB — LIPID PANEL
Cholesterol: 201 mg/dL — ABNORMAL HIGH (ref 125–200)
HDL: 32 mg/dL — AB (ref >=40)
LDL CALC: 125 mg/dL (ref <130)
Total CHOL/HDL Ratio: 6.3 Ratio — ABNORMAL HIGH (ref <=5.0)
Triglycerides: 218 mg/dL — ABNORMAL HIGH (ref <150)
VLDL: 44 mg/dL — AB (ref <30)

## 2014-10-31 NOTE — Addendum Note (Signed)
Addended by: Dolan Amen D on: 10/31/2014 02:11 PM   Modules accepted: Orders

## 2014-11-01 LAB — URINE CYTOLOGY ANCILLARY ONLY
CHLAMYDIA, DNA PROBE: NEGATIVE
NEISSERIA GONORRHEA: NEGATIVE

## 2014-11-01 LAB — RPR

## 2014-11-01 LAB — T-HELPER CELL (CD4) - (RCID CLINIC ONLY)
CD4 T CELL HELPER: 26 % — AB (ref 33–55)
CD4 T Cell Abs: 690 /uL (ref 400–2700)

## 2014-11-01 LAB — MICROALBUMIN / CREATININE URINE RATIO
Creatinine, Urine: 126.2 mg/dL
Microalb Creat Ratio: 19 mg/g (ref 0.0–30.0)
Microalb, Ur: 2.4 mg/dL — ABNORMAL HIGH (ref <2.0)

## 2014-11-01 LAB — HIV-1 RNA QUANT-NO REFLEX-BLD: HIV 1 RNA Quant: <20 copies/mL (ref <20)

## 2014-11-07 ENCOUNTER — Encounter (HOSPITAL_COMMUNITY): Payer: Self-pay | Admitting: Emergency Medicine

## 2014-11-07 ENCOUNTER — Emergency Department (HOSPITAL_COMMUNITY)
Admission: EM | Admit: 2014-11-07 | Discharge: 2014-11-08 | Disposition: A | Discharge status: Home / Self Care | Payer: Medicare Other | Attending: Emergency Medicine | Admitting: Emergency Medicine

## 2014-11-07 DIAGNOSIS — F209 Schizophrenia, unspecified: Secondary | ICD-10-CM | POA: Diagnosis not present

## 2014-11-07 DIAGNOSIS — F319 Bipolar disorder, unspecified: Secondary | ICD-10-CM | POA: Diagnosis not present

## 2014-11-07 DIAGNOSIS — F79 Unspecified intellectual disabilities: Secondary | ICD-10-CM | POA: Insufficient documentation

## 2014-11-07 DIAGNOSIS — B20 Human immunodeficiency virus [HIV] disease: Secondary | ICD-10-CM | POA: Insufficient documentation

## 2014-11-07 DIAGNOSIS — Z87448 Personal history of other diseases of urinary system: Secondary | ICD-10-CM | POA: Insufficient documentation

## 2014-11-07 DIAGNOSIS — Z046 Encounter for general psychiatric examination, requested by authority: Secondary | ICD-10-CM | POA: Diagnosis present

## 2014-11-07 DIAGNOSIS — D419 Neoplasm of uncertain behavior of unspecified urinary organ: Secondary | ICD-10-CM | POA: Diagnosis not present

## 2014-11-07 DIAGNOSIS — Z791 Long term (current) use of non-steroidal anti-inflammatories (NSAID): Secondary | ICD-10-CM | POA: Insufficient documentation

## 2014-11-07 DIAGNOSIS — F911 Conduct disorder, childhood-onset type: Secondary | ICD-10-CM | POA: Diagnosis not present

## 2014-11-07 DIAGNOSIS — Z79899 Other long term (current) drug therapy: Secondary | ICD-10-CM | POA: Diagnosis not present

## 2014-11-07 DIAGNOSIS — Z862 Personal history of diseases of the blood and blood-forming organs and certain disorders involving the immune mechanism: Secondary | ICD-10-CM | POA: Insufficient documentation

## 2014-11-07 DIAGNOSIS — Z8639 Personal history of other endocrine, nutritional and metabolic disease: Secondary | ICD-10-CM | POA: Insufficient documentation

## 2014-11-07 DIAGNOSIS — R4689 Other symptoms and signs involving appearance and behavior: Secondary | ICD-10-CM

## 2014-11-07 LAB — COMPREHENSIVE METABOLIC PANEL
ALBUMIN: 4.3 g/dL (ref 3.5–5.0)
ALT: 20 U/L (ref 17–63)
ANION GAP: 8 (ref 5–15)
AST: 18 U/L (ref 15–41)
Alkaline Phosphatase: 58 U/L (ref 38–126)
BUN: 18 mg/dL (ref 6–20)
CO2: 28 mmol/L (ref 22–32)
CREATININE: 1.65 mg/dL — AB (ref 0.61–1.24)
Calcium: 9.4 mg/dL (ref 8.9–10.3)
Chloride: 104 mmol/L (ref 101–111)
GFR calc Af Amer: 52 mL/min — ABNORMAL LOW (ref >60)
GFR, EST NON AFRICAN AMERICAN: 45 mL/min — AB (ref >60)
Glucose, Bld: 106 mg/dL — ABNORMAL HIGH (ref 65–99)
Potassium: 3.9 mmol/L (ref 3.5–5.1)
Sodium: 140 mmol/L (ref 135–145)
Total Bilirubin: 0.9 mg/dL (ref 0.3–1.2)
Total Protein: 7.7 g/dL (ref 6.5–8.1)

## 2014-11-07 LAB — CBC WITH DIFFERENTIAL/PLATELET
Basophils Absolute: 0 10*3/uL (ref 0.0–0.1)
Basophils Relative: 0 % (ref 0–1)
EOS ABS: 0.2 10*3/uL (ref 0.0–0.7)
EOS PCT: 3 % (ref 0–5)
HEMATOCRIT: 37.3 % — AB (ref 39.0–52.0)
Hemoglobin: 12 g/dL — ABNORMAL LOW (ref 13.0–17.0)
Lymphocytes Relative: 24 % (ref 12–46)
Lymphs Abs: 1.6 10*3/uL (ref 0.7–4.0)
MCH: 28.7 pg (ref 26.0–34.0)
MCHC: 32.2 g/dL (ref 30.0–36.0)
MCV: 89.2 fL (ref 78.0–100.0)
MONO ABS: 0.6 10*3/uL (ref 0.1–1.0)
MONOS PCT: 8 % (ref 3–12)
NEUTROS ABS: 4.3 10*3/uL (ref 1.7–7.7)
NEUTROS PCT: 65 % (ref 43–77)
PLATELETS: 145 10*3/uL — AB (ref 150–400)
RBC: 4.18 MIL/uL — ABNORMAL LOW (ref 4.22–5.81)
RDW: 14.3 % (ref 11.5–15.5)
WBC: 6.7 10*3/uL (ref 4.0–10.5)

## 2014-11-07 LAB — RAPID URINE DRUG SCREEN, HOSP PERFORMED
Amphetamines: NOT DETECTED
Barbiturates: NOT DETECTED
Benzodiazepines: NOT DETECTED
COCAINE: NOT DETECTED
Opiates: NOT DETECTED
Tetrahydrocannabinol: NOT DETECTED

## 2014-11-07 LAB — ETHANOL
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
## 2014-11-08 MED ORDER — TAMSULOSIN HCL 0.4 MG PO CAPS
0.4000 mg | ORAL_CAPSULE | Freq: Every day | ORAL | Status: DC
  Administered 2014-11-08: 0.4 mg via ORAL
  Filled 2014-11-08: qty 1

## 2014-11-08 MED ORDER — OLANZAPINE 5 MG PO TABS: 10.0000 mg | ORAL_TABLET | Freq: Every day | ORAL | Status: DC

## 2014-11-08 MED ORDER — ABACAVIR SULFATE 300 MG PO TABS
300.0000 mg | ORAL_TABLET | Freq: Every day | ORAL | Status: DC
  Administered 2014-11-08: 300 mg via ORAL
  Filled 2014-11-08 (×2): qty 1

## 2014-11-08 MED ORDER — FERROUS SULFATE 325 (65 FE) MG PO TABS
325.0000 mg | ORAL_TABLET | Freq: Three times a day (TID) | ORAL | Status: DC
  Administered 2014-11-08: 325 mg via ORAL
  Filled 2014-11-08 (×5): qty 1

## 2014-11-08 MED ORDER — FAMOTIDINE 20 MG PO TABS
20.0000 mg | ORAL_TABLET | Freq: Two times a day (BID) | ORAL | Status: DC
  Administered 2014-11-08: 20 mg via ORAL
  Filled 2014-11-08: qty 1

## 2014-11-08 MED ORDER — NAPROXEN 250 MG PO TABS
500.0000 mg | ORAL_TABLET | Freq: Two times a day (BID) | ORAL | Status: DC
  Administered 2014-11-08: 500 mg via ORAL
  Filled 2014-11-08: qty 2

## 2014-11-08 MED ORDER — LAMIVUDINE 150 MG PO TABS
150.0000 mg | ORAL_TABLET | Freq: Every day | ORAL | Status: DC
  Administered 2014-11-08: 150 mg via ORAL
  Filled 2014-11-08 (×2): qty 1

## 2014-11-08 MED ORDER — CLONAZEPAM 0.5 MG PO TABS: 2.0000 mg | ORAL_TABLET | Freq: Every day | ORAL | Status: DC

## 2014-11-08 MED ORDER — POLYETHYLENE GLYCOL 3350 17 G PO PACK
17.0000 g | PACK | Freq: Every day | ORAL | Status: DC | PRN
  Administered 2014-11-08: 17 g via ORAL
  Filled 2014-11-08: qty 1

## 2014-11-08 MED ORDER — LORAZEPAM 1 MG PO TABS
2.0000 mg | ORAL_TABLET | Freq: Once | ORAL | Status: AC
  Administered 2014-11-08: 2 mg via ORAL
  Filled 2014-11-08: qty 2

## 2014-11-08 MED ORDER — DIVALPROEX SODIUM 250 MG PO DR TAB
500.0000 mg | DELAYED_RELEASE_TABLET | Freq: Two times a day (BID) | ORAL | Status: DC
  Administered 2014-11-08: 500 mg via ORAL
  Filled 2014-11-08: qty 2

## 2014-11-08 MED ORDER — ATAZANAVIR SULFATE 150 MG PO CAPS
300.0000 mg | ORAL_CAPSULE | Freq: Every day | ORAL | Status: DC
  Administered 2014-11-08: 300 mg via ORAL
  Filled 2014-11-08 (×2): qty 2

## 2014-11-08 MED ORDER — TRAZODONE HCL 50 MG PO TABS: 100.0000 mg | ORAL_TABLET | Freq: Every day | ORAL | Status: DC

## 2014-11-08 MED ORDER — RITONAVIR 100 MG PO TABS
100.0000 mg | ORAL_TABLET | Freq: Every day | ORAL | Status: DC
  Administered 2014-11-08: 100 mg via ORAL
  Filled 2014-11-08 (×2): qty 1

## 2014-11-08 MED ORDER — GABAPENTIN 300 MG PO CAPS
600.0000 mg | ORAL_CAPSULE | Freq: Two times a day (BID) | ORAL | Status: DC
  Administered 2014-11-08: 600 mg via ORAL
  Filled 2014-11-08: qty 2

## 2014-11-08 MED ORDER — CLONAZEPAM 0.5 MG PO TABS
0.5000 mg | ORAL_TABLET | Freq: Every day | ORAL | Status: DC
  Administered 2014-11-08: 0.5 mg via ORAL
  Filled 2014-11-08: qty 1

## 2014-11-08 MED ORDER — CALCITRIOL 0.25 MCG PO CAPS
0.2500 ug | ORAL_CAPSULE | Freq: Every day | ORAL | Status: DC
  Administered 2014-11-08: 0.25 ug via ORAL
  Filled 2014-11-08 (×2): qty 1

## 2014-11-08 NOTE — ED Notes (Signed)
Pt ambulated to restroom with assist of sitter. NAD noted.   Pt is calm and cooperative at this time.

## 2014-11-08 NOTE — ED Notes (Signed)
Pt still restless.  Occasionally shouting and cursing.
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
## 2014-11-08 NOTE — ED Notes (Signed)
Pharmacy notified of missing dose of daily meds.

## 2014-11-08 NOTE — ED Notes (Signed)
Pt awake and given breakfast tray.  NAD noted at this time.

## 2014-11-08 NOTE — ED Notes (Signed)
Spoke with Aldona Bar from [facility], patient is cleared to be discharged from [facility] point of view.

## 2014-11-08 NOTE — Discharge Instructions (Signed)
If you were given medicines take as directed.  If you are on coumadin or contraceptives realize their levels and effectiveness is altered by many different medicines.  If you have any reaction (rash, tongues swelling, other) to the medicines stop taking and see a physician.    If your blood pressure was elevated in the ER make sure you follow up for management with a primary doctor or return for chest pain, shortness of breath or stroke symptoms.  Please follow up as directed and return to the ER or see a physician for new or worsening symptoms.  Thank you. Filed Vitals:   11/07/14 2106 11/08/14 0112 11/08/14 0746 11/08/14 1256  BP: 137/78 150/72 136/75 128/63  Pulse: 70 62 66 66  Temp: 98.3 F (36.8 C)  98.6 F (37 C) 97.7 F (36.5 C)  TempSrc: Oral  Oral Oral  Resp: 18 18 18 17   Height: 5\' 9"  (1.753 m)     Weight: 191 lb 1 oz (86.665 kg)     SpO2: 98% 98% 98% 98%
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
## 2014-11-08 NOTE — BH Assessment (Signed)
[place] Assessment Progress Note   Counselor advised pt's nurse, Aldona Bar, of the disposition for pt. She attempted to connect counselor to [facility], but he was with a patient. She agreed to advise him of the disposition and have him call TTS if he had any questions/concerns.   Kenna Gilbert. Lovena Le, [place], [place], LPCA Disposition Counselor

## 2014-12-01 ENCOUNTER — Ambulatory Visit: Payer: Self-pay | Admitting: Infectious Disease

## 2016-03-11 ENCOUNTER — Encounter: Payer: Self-pay | Admitting: Family Medicine

## 2016-03-11 ENCOUNTER — Encounter: Payer: Medicare Other | Admitting: Family Medicine

## 2016-03-11 VITALS — BP 135/84 | HR 102 | Temp 97.5°F | Resp 14 | Ht 69.0 in | Wt 180.0 lb

## 2016-03-11 NOTE — Progress Notes (Signed)
   Subjective:    Patient ID: Nathan [name], male    DOB: [birth year], 55 y.o.   MRN: [id number]  HPI    Review of Systems     Objective:   Physical Exam        Assessment & Plan:    This encounter was created in error - please disregard.

## 2016-03-13 ENCOUNTER — Ambulatory Visit (INDEPENDENT_AMBULATORY_CARE_PROVIDER_SITE_OTHER): Payer: Medicare Other | Admitting: Infectious Disease

## 2016-03-13 DIAGNOSIS — Z113 Encounter for screening for infections with a predominantly sexual mode of transmission: Secondary | ICD-10-CM | POA: Diagnosis not present

## 2016-03-13 DIAGNOSIS — N183 Chronic kidney disease, stage 3 unspecified: Secondary | ICD-10-CM

## 2016-03-13 DIAGNOSIS — N179 Acute kidney failure, unspecified: Secondary | ICD-10-CM

## 2016-03-13 DIAGNOSIS — F3162 Bipolar disorder, current episode mixed, moderate: Secondary | ICD-10-CM

## 2016-03-13 DIAGNOSIS — Z79899 Other long term (current) drug therapy: Secondary | ICD-10-CM | POA: Diagnosis not present

## 2016-03-13 DIAGNOSIS — F79 Unspecified intellectual disabilities: Secondary | ICD-10-CM | POA: Diagnosis not present

## 2016-03-13 DIAGNOSIS — B20 Human immunodeficiency virus [HIV] disease: Secondary | ICD-10-CM | POA: Diagnosis not present

## 2016-03-13 LAB — COMPLETE METABOLIC PANEL WITH GFR
ALT: 14 U/L (ref 9–46)
AST: 16 U/L (ref 10–35)
Albumin: 4 g/dL (ref 3.6–5.1)
Alkaline Phosphatase: 61 U/L (ref 40–115)
BUN: 20 mg/dL (ref 7–25)
CO2: 26 mmol/L (ref 20–31)
Calcium: 9.1 mg/dL (ref 8.6–10.3)
Chloride: 103 mmol/L (ref 98–110)
Creat: 1.54 mg/dL — ABNORMAL HIGH (ref 0.70–1.33)
GFR, EST AFRICAN AMERICAN: 57 mL/min — AB (ref >=60)
GFR, EST NON AFRICAN AMERICAN: 49 mL/min — AB (ref >=60)
Glucose, Bld: 104 mg/dL — ABNORMAL HIGH (ref 65–99)
Potassium: 3.9 mmol/L (ref 3.5–5.3)
SODIUM: 141 mmol/L (ref 135–146)
Total Bilirubin: 0.3 mg/dL (ref 0.2–1.2)
Total Protein: 7.5 g/dL (ref 6.1–8.1)

## 2016-03-13 LAB — LIPID PANEL
Cholesterol: 226 mg/dL — ABNORMAL HIGH (ref <200)
HDL: 36 mg/dL — AB (ref >40)
LDL CALC: 148 mg/dL — AB (ref <100)
Total CHOL/HDL Ratio: 6.3 Ratio — ABNORMAL HIGH (ref <5.0)
Triglycerides: 209 mg/dL — ABNORMAL HIGH (ref <150)
VLDL: 42 mg/dL — ABNORMAL HIGH (ref <30)

## 2016-03-13 LAB — CBC WITH DIFFERENTIAL/PLATELET
BASOS PCT: 0 %
Basophils Absolute: 0 cells/uL (ref 0–200)
EOS ABS: 360 {cells}/uL (ref 15–500)
Eosinophils Relative: 5 %
HEMATOCRIT: 38.1 % — AB (ref 38.5–50.0)
Hemoglobin: 12.5 g/dL — ABNORMAL LOW (ref 13.2–17.1)
Lymphocytes Relative: 40 %
Lymphs Abs: 2880 cells/uL (ref 850–3900)
MCH: 30.9 pg (ref 27.0–33.0)
MCHC: 32.8 g/dL (ref 32.0–36.0)
MCV: 94.1 fL (ref 80.0–100.0)
MONO ABS: 792 {cells}/uL (ref 200–950)
MPV: 12.8 fL — AB (ref 7.5–12.5)
Monocytes Relative: 11 %
NEUTROS ABS: 3168 {cells}/uL (ref 1500–7800)
Neutrophils Relative %: 44 %
Platelets: 128 10*3/uL — ABNORMAL LOW (ref 140–400)
RBC: 4.05 MIL/uL — ABNORMAL LOW (ref 4.20–5.80)
RDW: 14.2 % (ref 11.0–15.0)
WBC: 7.2 10*3/uL (ref 3.8–10.8)

## 2016-03-13 MED ORDER — EMTRICITABINE-TENOFOVIR AF 200-25 MG PO TABS: 1.0000 | ORAL_TABLET | Freq: Every day | ORAL | 11 refills | Status: DC

## 2016-03-13 MED ORDER — DOLUTEGRAVIR SODIUM 50 MG PO TABS: 50.0000 mg | ORAL_TABLET | Freq: Every day | ORAL | 11 refills | Status: DC

## 2016-03-13 NOTE — Progress Notes (Signed)
Subjective:   Chief complaint reestablish HIV care  Patient ID: Nathan [name], male    DOB: 1960/11/26, 55 y.o.   MRN: [id number]  HPI  Mr. Ramkissoon is a 55 year old man with multiple medical problems including HIV AIDS, cognitive disability with aggressive behavior bipolar disorder who had been followed here for some time on [place] NORVIR and Epzicom then had not been seen since 2015 in the interim having been followed at [facility] as recently as this fall in 2017. He was changed to [facility] and does [place] has been nicely suppressed on this regimen. He was incarcerated at [facility] for some time and now has been released and wants to reestablish care here in [place].  Past Medical History:  Diagnosis Date  . Anemia   . Anemia   . Anxiety   . Bipolar 1 disorder ([place])   . Depression, major   . HIV (human immunodeficiency virus infection) ([place])   . Hypernatremia   . Kidney failure   . Lithium toxicity   . MR (mental retardation)   . Schizophrenia [facility])     Past Surgical History:  Procedure Laterality Date  . BIOPSY N/A 04/12/2014   Procedure: BIOPSY;  Surgeon: Danie Binder, MD;  Location: [facility];  Service: Endoscopy;  Laterality: N/A;  Gastric  . COLONOSCOPY WITH PROPOFOL N/A 04/12/2014   SLF:  1. one colon polyp removed 2. The left colon is extremely redundant 3. Small internal hemorrhoids  . ESOPHAGOGASTRODUODENOSCOPY (EGD) WITH PROPOFOL N/A 04/12/2014   SLF: Stricture at the gastroesophageal junction 2. small polyp  in teh gastric body 3. Mild Non-erosive gastritis.   Marland Kitchen NOSE SURGERY  1968   unsure what type of surgery. something to help him breathe better  . POLYPECTOMY N/A 04/12/2014   Procedure: POLYPECTOMY;  Surgeon: Danie Binder, MD;  Location: [facility];  Service: Endoscopy;  Laterality: N/A;  Cecal  . SAVORY DILATION N/A 04/12/2014   Procedure: SAVORY DILATION;  Surgeon: Danie Binder, MD;  Location: [facility];  Service: Endoscopy;  Laterality: N/A;  12.8/ 14/15/16  .  SKIN BIOPSY      Family History  Problem Relation Age of Onset  . Colon cancer Neg Hx       Social History   Social History  . Marital status: Single    Spouse name: N/A  . Number of children: N/A  . Years of education: N/A   Social History Main Topics  . Smoking status: Never Smoker  . Smokeless tobacco: Never Used  . Alcohol use No  . Drug use: No  . Sexual activity: No   Other Topics Concern  . Not on file   Social History Narrative  . No narrative on file    No Known Allergies   Current Outpatient Prescriptions:  .  clozapine (FAZACLO) 100 MG disintegrating tablet, Take 200 mg by mouth daily., Disp: , Rfl:  .  divalproex (DEPAKOTE) 250 MG DR tablet, Take 500 mg by mouth 2 (two) times daily. , Disp: , Rfl:  .  dolutegravir (TIVICAY) 50 MG tablet, Take 1 tablet (50 mg total) by mouth daily., Disp: 30 tablet, Rfl: 11 .  folic acid (FOLVITE) 1 MG tablet, Take 1 mg by mouth daily., Disp: , Rfl:  .  haloperidol (HALDOL) 5 MG tablet, Take 15 mg by mouth daily with supper., Disp: , Rfl:  .  polyethylene glycol (MIRALAX / GLYCOLAX) packet, Take 17 g by mouth daily as needed for mild constipation or  moderate constipation. , Disp: , Rfl:  .  ranitidine (ZANTAC) 150 MG tablet, TAKE 1 TABLET BY MOUTH 12 HOURS AFTER TAKING REYATAZ., Disp: 30 tablet, Rfl: 3 .  senna (SENOKOT) 8.6 MG tablet, Take 2 tablets by mouth 2 (two) times daily., Disp: , Rfl:  .  sodium chloride (OCEAN) 0.65 % SOLN nasal spray, Place 1 spray into both nostrils as needed for congestion., Disp: , Rfl:  .  calcitRIOL (ROCALTROL) 0.25 MCG capsule, Take 0.25 mcg by mouth daily., Disp: , Rfl:  .  clonazePAM (KLONOPIN) 0.5 MG tablet, Take 1 tablet (0.5 mg total) by mouth daily. Take in the morning. (Patient not taking: Reported on 03/13/2016), Disp: 30 tablet, Rfl: 0 .  clonazePAM (KLONOPIN) 2 MG tablet, Take 2 mg by mouth at bedtime., Disp: , Rfl:  .  emtricitabine-tenofovir AF (DESCOVY) 200-25 MG tablet, Take 1  tablet by mouth daily., Disp: 30 tablet, Rfl: 11 .  ergocalciferol (VITAMIN D2) 50000 UNITS capsule, Take 50,000 Units by mouth once a week., Disp: , Rfl:  .  ferrous sulfate (FERROUSUL) 325 (65 FE) MG tablet, Take 325 mg by mouth 3 (three) times daily with meals. , Disp: , Rfl:  .  gabapentin (NEURONTIN) 600 MG tablet, Take 1 tablet (600 mg total) by mouth 2 (two) times daily. (Patient not taking: Reported on 03/13/2016), Disp: , Rfl:  .  glycopyrrolate (ROBINUL) 1 MG tablet, Take 1 mg by mouth 3 (three) times daily., Disp: , Rfl:  .  ipratropium (ATROVENT) 0.03 % nasal spray, Place 1 spray into both nostrils 2 (two) times daily., Disp: , Rfl:  .  naproxen (NAPROSYN) 500 MG tablet, Take 1 tablet (500 mg total) by mouth 2 (two) times daily with a meal. (Patient not taking: Reported on 03/13/2016), Disp: 60 tablet, Rfl: 5 .  OLANZapine (ZYPREXA) 5 MG tablet, Take 1 tablet (5 mg total) by mouth at bedtime. (Patient not taking: Reported on 03/13/2016), Disp: 30 tablet, Rfl: 0 .  Tamsulosin HCl (FLOMAX) 0.4 MG CAPS, Take 0.4 mg by mouth daily., Disp: , Rfl:  .  traZODone (DESYREL) 100 MG tablet, Take 100 mg by mouth at bedtime., Disp: , Rfl:    Review of Systems  Unable to perform ROS: Psychiatric disorder       Objective:   Physical Exam  Constitutional: He is oriented to person, place, and time. He appears well-developed and well-nourished. No distress.  HENT:  Head: Normocephalic and atraumatic.  Mouth/Throat: No oropharyngeal exudate.  Eyes: Conjunctivae and EOM are normal. No scleral icterus.  Neck: Normal range of motion. Neck supple.  Cardiovascular: Normal rate and regular rhythm.   Pulmonary/Chest: Effort normal. No respiratory distress. He has no wheezes.  Abdominal: He exhibits no distension.  Musculoskeletal: He exhibits no edema or tenderness.  Neurological: He is alert and oriented to person, place, and time. He exhibits normal muscle tone. Coordination normal.  Skin: Skin is  warm and dry. No rash noted. He is not diaphoretic. No erythema. No pallor.  Psychiatric: He has a normal mood and affect. His behavior is normal. Thought content normal. His speech is delayed and slurred. Cognition and memory are impaired.          Assessment & Plan:   HIV and AIDS continue TIVICAY discovery and check labs today.  Cognitive delay in mental retardation: Living in a group home. Prior chronic kidney disease renal functional be rechecked today  Bipolar disorder valproic acid

## 2016-03-14 LAB — T-HELPER CELL (CD4) - (RCID CLINIC ONLY)
CD4 T CELL HELPER: 29 % — AB (ref 33–55)
CD4 T Cell Abs: 840 /uL (ref 400–2700)

## 2016-03-14 LAB — RPR

## 2016-03-16 LAB — HIV RNA, RTPCR W/R GT (RTI, PI,INT): HIV-1 RNA, QN PCR: <20 copies/mL

## 2016-06-17 ENCOUNTER — Ambulatory Visit: Payer: Medicaid Other | Admitting: Infectious Disease

## 2016-07-20 ENCOUNTER — Emergency Department (HOSPITAL_COMMUNITY): Payer: Medicare Other

## 2016-07-20 ENCOUNTER — Encounter (HOSPITAL_COMMUNITY): Payer: Self-pay | Admitting: Emergency Medicine

## 2016-07-20 ENCOUNTER — Observation Stay (HOSPITAL_COMMUNITY)
Admission: EM | Admit: 2016-07-20 | Discharge: 2016-07-21 | Disposition: A | Discharge status: Home / Self Care | Payer: Medicare Other | Attending: Family Medicine | Admitting: Family Medicine

## 2016-07-20 DIAGNOSIS — F79 Unspecified intellectual disabilities: Secondary | ICD-10-CM | POA: Diagnosis not present

## 2016-07-20 DIAGNOSIS — F209 Schizophrenia, unspecified: Secondary | ICD-10-CM

## 2016-07-20 DIAGNOSIS — N183 Chronic kidney disease, stage 3 (moderate): Secondary | ICD-10-CM | POA: Diagnosis not present

## 2016-07-20 DIAGNOSIS — R4182 Altered mental status, unspecified: Secondary | ICD-10-CM | POA: Diagnosis not present

## 2016-07-20 DIAGNOSIS — K5909 Other constipation: Secondary | ICD-10-CM | POA: Diagnosis not present

## 2016-07-20 DIAGNOSIS — B20 Human immunodeficiency virus [HIV] disease: Secondary | ICD-10-CM | POA: Insufficient documentation

## 2016-07-20 DIAGNOSIS — F419 Anxiety disorder, unspecified: Secondary | ICD-10-CM | POA: Diagnosis not present

## 2016-07-20 DIAGNOSIS — F319 Bipolar disorder, unspecified: Secondary | ICD-10-CM | POA: Insufficient documentation

## 2016-07-20 DIAGNOSIS — R569 Unspecified convulsions: Secondary | ICD-10-CM | POA: Diagnosis not present

## 2016-07-20 DIAGNOSIS — K219 Gastro-esophageal reflux disease without esophagitis: Secondary | ICD-10-CM | POA: Diagnosis not present

## 2016-07-20 DIAGNOSIS — N179 Acute kidney failure, unspecified: Secondary | ICD-10-CM | POA: Insufficient documentation

## 2016-07-20 DIAGNOSIS — R2689 Other abnormalities of gait and mobility: Secondary | ICD-10-CM | POA: Insufficient documentation

## 2016-07-20 DIAGNOSIS — Z79899 Other long term (current) drug therapy: Secondary | ICD-10-CM | POA: Diagnosis not present

## 2016-07-20 DIAGNOSIS — R400 Somnolence: Secondary | ICD-10-CM

## 2016-07-20 LAB — CBG MONITORING, ED: GLUCOSE-CAPILLARY: 161 mg/dL — AB (ref 65–99)

## 2016-07-20 LAB — I-STAT ARTERIAL BLOOD GAS, ED
Acid-Base Excess: 3 mmol/L — ABNORMAL HIGH (ref 0.0–2.0)
Bicarbonate: 28.7 mmol/L — ABNORMAL HIGH (ref 20.0–28.0)
O2 Saturation: 94 %
PCO2 ART: 48.9 mmHg — AB (ref 32.0–48.0)
TCO2: 30 mmol/L (ref 0–100)
pH, Arterial: 7.377 (ref 7.350–7.450)
pO2, Arterial: 73 mmHg — ABNORMAL LOW (ref 83.0–108.0)

## 2016-07-20 LAB — CBC WITH DIFFERENTIAL/PLATELET
BASOS ABS: 0 10*3/uL (ref 0.0–0.1)
Basophils Relative: 0 %
Eosinophils Absolute: 0.2 10*3/uL (ref 0.0–0.7)
Eosinophils Relative: 2 %
HEMATOCRIT: 38.8 % — AB (ref 39.0–52.0)
Hemoglobin: 12.5 g/dL — ABNORMAL LOW (ref 13.0–17.0)
Lymphocytes Relative: 36 %
Lymphs Abs: 3 10*3/uL (ref 0.7–4.0)
MCH: 28.7 pg (ref 26.0–34.0)
MCHC: 32.2 g/dL (ref 30.0–36.0)
MCV: 89 fL (ref 78.0–100.0)
MONO ABS: 0.7 10*3/uL (ref 0.1–1.0)
Monocytes Relative: 9 %
NEUTROS ABS: 4.3 10*3/uL (ref 1.7–7.7)
Neutrophils Relative %: 53 %
PLATELETS: 124 10*3/uL — AB (ref 150–400)
RBC: 4.36 MIL/uL (ref 4.22–5.81)
RDW: 13.3 % (ref 11.5–15.5)
WBC: 8.2 10*3/uL (ref 4.0–10.5)

## 2016-07-20 LAB — COMPREHENSIVE METABOLIC PANEL
ALBUMIN: 3.8 g/dL (ref 3.5–5.0)
ALT: 21 U/L (ref 17–63)
AST: 25 U/L (ref 15–41)
Alkaline Phosphatase: 66 U/L (ref 38–126)
Anion gap: 9 (ref 5–15)
BILIRUBIN TOTAL: 0.5 mg/dL (ref 0.3–1.2)
BUN: 20 mg/dL (ref 6–20)
CHLORIDE: 106 mmol/L (ref 101–111)
CO2: 23 mmol/L (ref 22–32)
Calcium: 9.1 mg/dL (ref 8.9–10.3)
Creatinine, Ser: 1.71 mg/dL — ABNORMAL HIGH (ref 0.61–1.24)
GFR calc Af Amer: 49 mL/min — ABNORMAL LOW (ref >60)
GFR calc non Af Amer: 43 mL/min — ABNORMAL LOW (ref >60)
GLUCOSE: 178 mg/dL — AB (ref 65–99)
POTASSIUM: 3.8 mmol/L (ref 3.5–5.1)
Sodium: 138 mmol/L (ref 135–145)
Total Protein: 7.6 g/dL (ref 6.5–8.1)

## 2016-07-20 LAB — TROPONIN I: Troponin I: <0.03 ng/mL (ref <0.03)

## 2016-07-20 LAB — VALPROIC ACID LEVEL: Valproic Acid Lvl: 76 ug/mL (ref 50.0–100.0)

## 2016-07-20 LAB — ETHANOL: Alcohol, Ethyl (B): <5 mg/dL (ref <5)

## 2016-07-20 LAB — AMMONIA: AMMONIA: 52 umol/L — AB (ref 9–35)

## 2016-07-20 MED ORDER — ACETAMINOPHEN 650 MG RE SUPP: 650.0000 mg | Freq: Four times a day (QID) | RECTAL | Status: DC | PRN

## 2016-07-20 MED ORDER — ENOXAPARIN SODIUM 40 MG/0.4ML ~~LOC~~ SOLN
40.0000 mg | Freq: Every day | SUBCUTANEOUS | Status: DC
  Administered 2016-07-21: 40 mg via SUBCUTANEOUS
  Filled 2016-07-20: qty 0.4

## 2016-07-20 MED ORDER — LORAZEPAM 2 MG/ML IJ SOLN: 1.0000 mg | Freq: Four times a day (QID) | INTRAMUSCULAR | Status: DC | PRN

## 2016-07-20 MED ORDER — SENNA 8.6 MG PO TABS: 2.0000 | ORAL_TABLET | Freq: Two times a day (BID) | ORAL | Status: DC

## 2016-07-20 MED ORDER — SODIUM CHLORIDE 0.9 % IV SOLN
INTRAVENOUS | Status: DC
  Administered 2016-07-20 – 2016-07-21 (×2): via INTRAVENOUS

## 2016-07-20 MED ORDER — IPRATROPIUM BROMIDE 0.06 % NA SOLN
1.0000 | Freq: Two times a day (BID) | NASAL | Status: DC
  Administered 2016-07-21: 1 via NASAL
  Filled 2016-07-20: qty 15

## 2016-07-20 MED ORDER — ACETAMINOPHEN 325 MG PO TABS: 650.0000 mg | ORAL_TABLET | Freq: Four times a day (QID) | ORAL | Status: DC | PRN

## 2016-07-20 MED ORDER — DOLUTEGRAVIR SODIUM 50 MG PO TABS
50.0000 mg | ORAL_TABLET | Freq: Every day | ORAL | Status: DC
  Administered 2016-07-21: 50 mg via ORAL
  Filled 2016-07-20: qty 1

## 2016-07-20 MED ORDER — POLYETHYLENE GLYCOL 3350 17 G PO PACK
17.0000 g | PACK | Freq: Every day | ORAL | Status: DC | PRN
  Filled 2016-07-20: qty 1

## 2016-07-20 MED ORDER — EMTRICITABINE-TENOFOVIR AF 200-25 MG PO TABS
1.0000 | ORAL_TABLET | Freq: Every day | ORAL | Status: DC
  Administered 2016-07-21: 1 via ORAL
  Filled 2016-07-20: qty 1

## 2016-07-20 MED ORDER — ABACAVIR SULFATE 300 MG PO TABS
300.0000 mg | ORAL_TABLET | Freq: Two times a day (BID) | ORAL | Status: DC
  Administered 2016-07-21: 300 mg via ORAL
  Filled 2016-07-20: qty 1

## 2016-07-20 MED ORDER — LORAZEPAM 2 MG/ML IJ SOLN: 1.0000 mg | INTRAMUSCULAR | Status: DC | PRN

## 2016-07-20 MED ORDER — CLONAZEPAM 0.5 MG PO TABS
0.5000 mg | ORAL_TABLET | Freq: Every day | ORAL | Status: DC
  Administered 2016-07-21: 0.5 mg via ORAL
  Filled 2016-07-20: qty 1

## 2016-07-20 MED ORDER — AMMONIA AROMATIC IN INHA
RESPIRATORY_TRACT | Status: AC
  Filled 2016-07-20: qty 10

## 2016-07-20 MED ORDER — LORAZEPAM 1 MG PO TABS: 1.0000 mg | ORAL_TABLET | Freq: Four times a day (QID) | ORAL | Status: DC | PRN

## 2016-07-20 NOTE — ED Triage Notes (Signed)
Pt to ED from church -- pt found to be unresponsive- received Narcan 2mg  Intranasal per [employer] on initial arrival-- pt responded a little -- on arrival to ED, pt is nonverbal, will respond to painful stimuli, will follow commands for Dr. Venora Maples after painful stimuli. [place] resp,

## 2016-07-20 NOTE — Progress Notes (Signed)
Responded to page for incoming  STEMI pt. STEMI shortly canceled. Chaplain available for f/u.   07/20/16 1300  Clinical Encounter Type  Visited With Patient not available  Visit Type Initial;Psychological support;Spiritual support;Social support;ED  Referral From Nurse   Gerrit Heck, Chaplain

## 2016-07-20 NOTE — H&P (Signed)
[facility] Admission History and Physical Service Pager: [phone]  Patient name: Nathan [name] Medical record number: [id number] Date of birth: [birth year] Age: 56 y.o. Gender: male  Primary Care Provider: Javier Docker, MD Consultants: None Code Status: Full, as patient unable to speak for himself; Zane Herald, CSW is his legal guardian [phone])  Chief Complaint: Altered mentation  Assessment and Plan: Nathan [name] is a 56 y.o. male presenting with altered mentation. PMH is significant for AIDS, bipolar 1, cognitive disability, schizophrenia, CKD III, GERD, lives in group home  Altered mentation/Somnolence - Uncertain etiology. GCS of 9 (will inconsistently open eyes to pain/command; had minimal verbal response on admission; localizes pain and inconsistently will grip hands). Does become more alert to ammonia. Considering iatrogenic causes, seizure/post-ictal. Stroke or head trauma considered however less likely with negative CT head, patient moving 4 extremities equally.  Infection considered, though less likely with no leukocytosis or fevers. Intoxication considered, EtOH level negative. Ammonia level somewhat elevated at 52. Patient is not diabetic, CBG 161 on admission, ruling out hypoglycemia. Depakote level WNL at 76. No improvement s/p narcan in ED. - Admit to FPTS attending Dr. Andria Frames for altered mentation workup and treatment - hold sedating medications: haloperidol, divalproex (Depakote), gabapentin, benztropine (Cogentin) - CXR, UA/UCx, Blood culture to rule-out infectious etiology - TSH - Urine drug screen - ABG with pH 7.377 - monitor patient overnight for mental status changes and ability to protect airway - NPO pending SLP eval - continuous pulse ox - Will order CIWA protocol for IV ativan if shows signs of withdrawal, as unable to take PO at moment and takes klonopin 2 mg QHS - Will add prn ativan IV in case of seizure -  Would obtain MRI brain if little improvement over next 24 hours, as pupils remain constricted and note of dysconjugate gaze on CT head - Follow-up I/Os  HIV with AIDS - followed by Dr. Tommy Medal, most recently seen 03/2016. Home medications on MAR include Abacavir (Ziagen) 300 mg BID (not in most recent ID note) and emtricitabine-tenofovir (Descovy) 200-25 mg Qd and Dolutegravir (Ticavay) noted in most recent ID note. - will continue Descovy and Ticavay once SLP sees - Consult ID in AM for further clarification/recommendations - Obtain CD4 count, viral load  Bipolar 1/Hx aggressive behavior - Home regimen includes Clonazepam 2 mg PM, benztropine 0.5 mg BID, Depakote [phone] mg QD, Haloperidol 15 mg QD, Clozapine 200 mg QD - holding sedating home psych meds as noted above - add back as appropriate when mentation improves - consider restarting depakote as WNL but could be contributing to elevated ammonia  Chronic Constipation - continue home bowel regimen senna and miralax  AKI on CKD stage 3 - Baseline Cr ~1.5-1.6, slightly elevated at 1.71 on admission. May be prerenal given has not eaten all day  - avoid nephrotoxic agents - consider renally dosing gabapentin at DC - AM BMP - IV fluids  Diffuse ST elevations on EKG: Could suggest pericarditis but no distant heart sounds, fever, or complaints prior to change in MS and appears to have been present on past EKGs (06/21/14) - Repeat EKG in AM  FEN/GI: NPO pending SLP eval, IV NS 100 ml/hr Prophylaxis: lovenox  Disposition: Admit to FPTS for AMS workup and treatment  History of Present Illness:  Nathan [name] is a 56 y.o. male presenting with altered mentation.  Patient was in his usual state of health (baseline some slurring with speech, slumped shuffling gait,  lives in group home) until this morning in church when he was noted to be sleepier, with head nodding over and slobbering. He would wake slightly with elbow jabs from his  caretaker, but continued to be sleepy and so EMS was called and he was carried out.  Prior to church, medications were handed out to all of the group home members, and the patient was thought to be normal at that time. Per his caretaker it is very unlikely that he would have been given incorrect medications as administration is carefully observed. Patient became sleepy somewhat suddenly with no loss of urine, no convulsions or eye rolling. He did not fall or hit his head.  He has had no recent fevers, N/V/D/C, no urinary changes.  No recent medication changes.  No known drug or alcohol use. Patient may have a distant history of seizures however per his caretaker no seizures in the past 2 years. He is somewhat new to his group home, as he came from [place] in November 2017.   Had previous hospitalization in 2013 with AMS that was thought to be 2/2 lithium toxicity.  In the ED, EtOH, valproic acid level and CT head were unremarkable. He was able to wake with ammonia under his nose per ED nurse at bedside. No improvement s/p narcan. Decision was to admit for altered mentation.  Review Of Systems: Per HPI.  ROS: Level 5 Caveat for decreased LOC  Patient Active Problem List   Diagnosis Date Noted  . Altered mental state 07/20/2016  . Aggressive behavior   . GERD (gastroesophageal reflux disease) 07/25/2014  . Acute encephalopathy 06/23/2014  . Hypernatremia 06/20/2014  . Dehydration 06/18/2014  . Lithium toxicity 06/18/2014  . ARF (acute renal failure) ([place]) 06/18/2014  . Acute urinary retention 06/18/2014  . CAP (community acquired pneumonia) 06/18/2014  . Encounter for screening colonoscopy 03/25/2014  . Dysphagia, pharyngoesophageal phase 03/25/2014  . CKD (chronic kidney disease) stage 3, GFR 30-59 ml/min 07/22/2012  . AIDS ([place]) 06/19/2012  . Bipolar 1 disorder, mixed, moderate ([place]) 06/19/2012  . Hyponatremia 06/19/2012  . Mental retardation 06/19/2012    Past Medical History: Past  Medical History:  Diagnosis Date  . Anemia   . Anemia   . Anxiety   . Bipolar 1 disorder ([place])   . Depression, major   . HIV (human immunodeficiency virus infection) ([place])   . Hypernatremia   . Kidney failure   . Lithium toxicity   . MR (mental retardation)   . Schizophrenia [facility])     Past Surgical History: Past Surgical History:  Procedure Laterality Date  . BIOPSY N/A 04/12/2014   Procedure: BIOPSY;  Surgeon: Danie Binder, MD;  Location: [facility];  Service: Endoscopy;  Laterality: N/A;  Gastric  . COLONOSCOPY WITH PROPOFOL N/A 04/12/2014   SLF:  1. one colon polyp removed 2. The left colon is extremely redundant 3. Small internal hemorrhoids  . ESOPHAGOGASTRODUODENOSCOPY (EGD) WITH PROPOFOL N/A 04/12/2014   SLF: Stricture at the gastroesophageal junction 2. small polyp  in teh gastric body 3. Mild Non-erosive gastritis.   Marland Kitchen NOSE SURGERY  1968   unsure what type of surgery. something to help him breathe better  . POLYPECTOMY N/A 04/12/2014   Procedure: POLYPECTOMY;  Surgeon: Danie Binder, MD;  Location: [facility];  Service: Endoscopy;  Laterality: N/A;  Cecal  . SAVORY DILATION N/A 04/12/2014   Procedure: SAVORY DILATION;  Surgeon: Danie Binder, MD;  Location: [facility];  Service: Endoscopy;  Laterality: N/A;  12.8/ 14/15/16  . SKIN BIOPSY      Social History: Social History  Substance Use Topics  . Smoking status: Never Smoker  . Smokeless tobacco: Never Used  . Alcohol use No   Additional social history: Caretaker doesn't think he has had issue with substance abuse.  Please also refer to relevant sections of EMR.  Family History: Family History  Problem Relation Age of Onset  . Colon cancer Neg Hx    Caretaker unaware of any family members.   Allergies and Medications: No Known Allergies No current facility-administered medications on file prior to encounter.    Current Outpatient Prescriptions on File Prior to Encounter  Medication Sig Dispense Refill  . clonazePAM  (KLONOPIN) 2 MG tablet Take 2 mg by mouth at bedtime.    . clozapine (FAZACLO) 100 MG disintegrating tablet Take 200 mg by mouth daily with supper.     Marland Kitchen emtricitabine-tenofovir AF (DESCOVY) 200-25 MG tablet Take 1 tablet by mouth daily. 30 tablet 11  . folic acid (FOLVITE) 1 MG tablet Take 1 mg by mouth daily.    Marland Kitchen gabapentin (NEURONTIN) 600 MG tablet Take 1 tablet (600 mg total) by mouth 2 (two) times daily. (Patient taking differently: Take 700 mg by mouth 2 (two) times daily. )    . haloperidol (HALDOL) 5 MG tablet Take 15 mg by mouth daily with supper.    Marland Kitchen ipratropium (ATROVENT) 0.03 % nasal spray 1 spray 2 (two) times daily. UNDER THE TONGUE    . polyethylene glycol (MIRALAX / GLYCOLAX) packet Take 17 g by mouth 2 (two) times daily as needed for mild constipation or moderate constipation.     . ranitidine (ZANTAC) 150 MG tablet TAKE 1 TABLET BY MOUTH 12 HOURS AFTER TAKING REYATAZ. 30 tablet 3  . senna (SENOKOT) 8.6 MG tablet Take 2 tablets by mouth 2 (two) times daily.    . sodium chloride (OCEAN) 0.65 % SOLN nasal spray Place 1 spray into both nostrils as needed for congestion.      Objective: BP 110/75   Pulse 65   Temp 97.1 F (36.2 C) (Oral)   Resp 14   Ht 6' (1.829 m)   Wt 181 lb (82.1 kg)   SpO2 98%   BMI 24.55 kg/m  Exam: General: +somnolent, rouses briefly to very strong painful stimuli or sternal rub but goes back to sleep, snoring, appears to be protecting his airway Eyes: pupils equally round and reactive, constricted at ~78mm bilaterally, no conjunctival pallor or injection ENTM: no rhinorrhea or exudate, R TM obstructed by cerumen, L TM WNL, dry mucous membranes Neck: no lymphadenopathy Cardiovascular: RRR, no m/r/g Respiratory: CTA bil, no W/R/R Gastrointestinal: soft, nondistended, normoactive BS MSK: moves 4 extremities in response to pain Derm: no rashes or lesions, no skin breakdown noted Neuro: +purposeful movements in upper extremities bilaterally (pulls at  lines), withdraws from pain LE bilaterally, pupils constricted ~ 58mm,  equally round and reactive bilaterally, +somnolence, barely wakes for 2-3 seconds the entire exam in response to painful stimuli Psych: altered mental status, somnolence, see above  Labs and Imaging: CBC BMET   Recent Labs Lab 07/20/16 1314  WBC 8.2  HGB 12.5*  HCT 38.8*  PLT 124*    Recent Labs Lab 07/20/16 1314  NA 138  K 3.8  CL 106  CO2 23  BUN 20  CREATININE 1.71*  GLUCOSE 178*  CALCIUM 9.1     Ct Head Wo Contrast 07/20/2016 FINDINGS:  Brain: No evidence of  parenchymal hemorrhage or extra-axial fluid collection. No mass lesion, mass effect, or midline shift. No CT evidence of acute infarction.  Cerebral volume is age appropriate. No ventriculomegaly.  Vascular: No hyperdense vessel or unexpected calcification.  Skull: No evidence of calvarial fracture.  Sinuses/Orbits: Complete left frontal sinus opacification, unchanged.  Mild mucoperiosteal thickening in the ethmoidal air cells, not appreciably changed. Tiny mucous retention cysts versus polyps in the right maxillary sinus. No fluid levels. Dysconjugate gaze.  Other:  The mastoid air cells are unopacified.  IMPRESSION:  1.  No evidence of acute intracranial abnormality.  2. Dysconjugate gaze.  3. Chronic paranasal sinusitis.   Everrett Coombe, MD 07/20/2016, 8:53 PM PGY-1, [place] [facility] pager: [phone], text pages welcome  Upper Level Addendum:  I have seen and evaluated this patient along with Dr. Burr Medico and reviewed the above note, making necessary revisions in green.  Rogue Bussing, MD PGY-2,  [facility] 07/20/2016 11:33 PM

## 2016-07-20 NOTE — ED Notes (Signed)
Cancelled Code STEMI per Dr. Venora Maples

## 2016-07-20 NOTE — Progress Notes (Signed)
RT notified at 2100 of an ABG ordered at 1706 before this RT's shift. This RT attempted to stick this patient twice without success. Another RT is going to attempt to collect.

## 2016-07-20 NOTE — ED Notes (Signed)
To CT scan

## 2016-07-20 NOTE — Progress Notes (Signed)
Pt arrived to unit @ 2230 from [facility] ED via bed.

## 2016-07-20 NOTE — ED Provider Notes (Signed)
[facility] DEPT Provider Note   CSN: [id number] Arrival date & time: 07/20/16  1306     History   Chief Complaint Chief Complaint  Patient presents with  . unresponsive  . Altered Mental Status   Level V caveat: Altered mental status  HPI Nathan [name] is a 56 y.o. male.  HPI Patient has a history of schizophrenia and mental retardation as well as bipolar disorder who presents to the emergency department after having been found minimally responsive at church.  Per the group home leader he was fine this morning.  His been eating normally.  His had no recent illness.   Past Medical History:  Diagnosis Date  . Anemia   . Anemia   . Anxiety   . Bipolar 1 disorder ([place])   . Depression, major   . HIV (human immunodeficiency virus infection) ([place])   . Hypernatremia   . Kidney failure   . Lithium toxicity   . MR (mental retardation)   . Schizophrenia [facility])     Patient Active Problem List   Diagnosis Date Noted  . Aggressive behavior   . GERD (gastroesophageal reflux disease) 07/25/2014  . Acute encephalopathy 06/23/2014  . Hypernatremia 06/20/2014  . Dehydration 06/18/2014  . Lithium toxicity 06/18/2014  . ARF (acute renal failure) ([place]) 06/18/2014  . Acute urinary retention 06/18/2014  . CAP (community acquired pneumonia) 06/18/2014  . Encounter for screening colonoscopy 03/25/2014  . Dysphagia, pharyngoesophageal phase 03/25/2014  . CKD (chronic kidney disease) stage 3, GFR 30-59 ml/min 07/22/2012  . AIDS ([place]) 06/19/2012  . Bipolar 1 disorder, mixed, moderate ([place]) 06/19/2012  . Hyponatremia 06/19/2012  . Mental retardation 06/19/2012    Past Surgical History:  Procedure Laterality Date  . BIOPSY N/A 04/12/2014   Procedure: BIOPSY;  Surgeon: Danie Binder, MD;  Location: [facility] ORS;  Service: Endoscopy;  Laterality: N/A;  Gastric  . COLONOSCOPY WITH PROPOFOL N/A 04/12/2014   SLF:  1. one colon polyp removed 2. The left colon is extremely redundant 3. Small  internal hemorrhoids  . ESOPHAGOGASTRODUODENOSCOPY (EGD) WITH PROPOFOL N/A 04/12/2014   SLF: Stricture at the gastroesophageal junction 2. small polyp  in teh gastric body 3. Mild Non-erosive gastritis.   Marland Kitchen NOSE SURGERY  1968   unsure what type of surgery. something to help him breathe better  . POLYPECTOMY N/A 04/12/2014   Procedure: POLYPECTOMY;  Surgeon: Danie Binder, MD;  Location: [facility] ORS;  Service: Endoscopy;  Laterality: N/A;  Cecal  . SAVORY DILATION N/A 04/12/2014   Procedure: SAVORY DILATION;  Surgeon: Danie Binder, MD;  Location: [facility] ORS;  Service: Endoscopy;  Laterality: N/A;  12.8/ 14/15/16  . SKIN BIOPSY         Home Medications    Prior to Admission medications   Medication Sig Start Date End Date Taking? Authorizing Provider  calcitRIOL (ROCALTROL) 0.25 MCG capsule Take 0.25 mcg by mouth daily.    Historical Provider, MD  clonazePAM (KLONOPIN) 0.5 MG tablet Take 1 tablet (0.5 mg total) by mouth daily. Take in the morning. Patient not taking: Reported on 03/13/2016 06/25/14   Samuella Cota, MD  clonazePAM (KLONOPIN) 2 MG tablet Take 2 mg by mouth at bedtime.    Historical Provider, MD  clozapine (FAZACLO) 100 MG disintegrating tablet Take 200 mg by mouth daily.    Historical Provider, MD  divalproex (DEPAKOTE) 250 MG DR tablet Take 500 mg by mouth 2 (two) times daily.     Historical Provider, MD  dolutegravir (  TIVICAY) 50 MG tablet Take 1 tablet (50 mg total) by mouth daily. 03/13/16   Truman Hayward, MD  emtricitabine-tenofovir AF (DESCOVY) 200-25 MG tablet Take 1 tablet by mouth daily. 03/13/16   Truman Hayward, MD  ergocalciferol (VITAMIN D2) 50000 UNITS capsule Take 50,000 Units by mouth once a week.    Historical Provider, MD  ferrous sulfate (FERROUSUL) 325 (65 FE) MG tablet Take 325 mg by mouth 3 (three) times daily with meals.     Historical Provider, MD  folic acid (FOLVITE) 1 MG tablet Take 1 mg by mouth daily.    Historical Provider, MD  gabapentin  (NEURONTIN) 600 MG tablet Take 1 tablet (600 mg total) by mouth 2 (two) times daily. Patient not taking: Reported on 03/13/2016 06/25/14   Samuella Cota, MD  glycopyrrolate (ROBINUL) 1 MG tablet Take 1 mg by mouth 3 (three) times daily.    Historical Provider, MD  haloperidol (HALDOL) 5 MG tablet Take 15 mg by mouth daily with supper.    Historical Provider, MD  ipratropium (ATROVENT) 0.03 % nasal spray Place 1 spray into both nostrils 2 (two) times daily.    Historical Provider, MD  naproxen (NAPROSYN) 500 MG tablet Take 1 tablet (500 mg total) by mouth 2 (two) times daily with a meal. Patient not taking: Reported on 03/13/2016 09/27/14   Carole Civil, MD  OLANZapine (ZYPREXA) 5 MG tablet Take 1 tablet (5 mg total) by mouth at bedtime. Patient not taking: Reported on 03/13/2016 06/25/14   Samuella Cota, MD  polyethylene glycol [facility] / Floria Raveling) packet Take 17 g by mouth daily as needed for mild constipation or moderate constipation.     Historical Provider, MD  ranitidine (ZANTAC) 150 MG tablet TAKE 1 TABLET BY MOUTH 12 HOURS AFTER TAKING REYATAZ. 10/31/12   Truman Hayward, MD  senna (SENOKOT) 8.6 MG tablet Take 2 tablets by mouth 2 (two) times daily.    Historical Provider, MD  sodium chloride (OCEAN) 0.65 % SOLN nasal spray Place 1 spray into both nostrils as needed for congestion.    Historical Provider, MD  Tamsulosin HCl (FLOMAX) 0.4 MG CAPS Take 0.4 mg by mouth daily.    Historical Provider, MD  traZODone (DESYREL) 100 MG tablet Take 100 mg by mouth at bedtime.    Historical Provider, MD    Family History Family History  Problem Relation Age of Onset  . Colon cancer Neg Hx     Social History Social History  Substance Use Topics  . Smoking status: Never Smoker  . Smokeless tobacco: Never Used  . Alcohol use No     Allergies   Patient has no known allergies.   Review of Systems Review of Systems  Unable to perform ROS: Mental status change     Physical  Exam Updated Vital Signs BP 106/61   Pulse 67   Temp 97.1 F (36.2 C) (Oral)   Resp 18   Ht 6' (1.829 m)   Wt 181 lb (82.1 kg)   SpO2 96%   BMI 24.55 kg/m   Physical Exam  Constitutional: He appears well-developed and well-nourished.  HENT:  Head: Normocephalic and atraumatic.  Eyes: EOM are normal.  Neck: Normal range of motion.  Cardiovascular: Normal rate, regular rhythm and normal heart sounds.   Pulmonary/Chest: Effort normal and breath sounds normal. No respiratory distress.  Abdominal: Soft. He exhibits no distension. There is no tenderness.  Musculoskeletal: Normal range of motion.  Neurological:  Opens eyes to painful stimuli.  Answers very supple questions.  Moves all 4 extremities when prompted alongside painful stimuli  Skin: Skin is warm and dry.  Nursing note and vitals reviewed.    ED Treatments / Results  Labs (all labs ordered are listed, but only abnormal results are displayed) Labs Reviewed  CBC WITH DIFFERENTIAL/PLATELET - Abnormal; Notable for the following:       Result Value   Hemoglobin 12.5 (*)    HCT 38.8 (*)    Platelets 124 (*)    All other components within normal limits  COMPREHENSIVE METABOLIC PANEL - Abnormal; Notable for the following:    Glucose, Bld 178 (*)    Creatinine, Ser 1.71 (*)    GFR calc non Af Amer 43 (*)    GFR calc Af Amer 49 (*)    All other components within normal limits  CBG MONITORING, ED - Abnormal; Notable for the following:    Glucose-Capillary 161 (*)    All other components within normal limits  ETHANOL  TROPONIN I  VALPROIC ACID LEVEL    EKG  EKG Interpretation  Date/Time:  Saturday July 20 2016 13:10:40 EDT Ventricular Rate:  81 PR Interval:    QRS Duration: 98 QT Interval:  361 QTC Calculation: 419 R Axis:   70 Text Interpretation:  Sinus rhythm Probable left ventricular hypertrophy ST elevation suggests acute pericarditis No significant change was found Confirmed by Ronold Hardgrove  MD, Marlowe Cinquemani ([id number])  on 07/20/2016 4:32:03 PM       Radiology Ct Head Wo Contrast  Result Date: 07/20/2016 CLINICAL DATA:  Unresponsiveness.  History of HIV. EXAM: CT HEAD WITHOUT CONTRAST TECHNIQUE: Contiguous axial images were obtained from the base of the skull through the vertex without intravenous contrast. COMPARISON:  05/05/2011 head CT. FINDINGS: Brain: No evidence of parenchymal hemorrhage or extra-axial fluid collection. No mass lesion, mass effect, or midline shift. No CT evidence of acute infarction. Cerebral volume is age appropriate. No ventriculomegaly. Vascular: No hyperdense vessel or unexpected calcification. Skull: No evidence of calvarial fracture. Sinuses/Orbits: Complete left frontal sinus opacification, unchanged. Mild mucoperiosteal thickening in the ethmoidal air cells, not appreciably changed. Tiny mucous retention cysts versus polyps in the right maxillary sinus. No fluid levels. Dysconjugate gaze. Other:  The mastoid air cells are unopacified. IMPRESSION: 1.  No evidence of acute intracranial abnormality. 2. Dysconjugate gaze. 3. Chronic paranasal sinusitis. Electronically Signed   By: Ilona Sorrel M.D.   On: 07/20/2016 16:23    Procedures Procedures (including critical care time)  Medications Ordered in ED Medications - No data to display   Initial Impression / Assessment and Plan / ED Course  I have reviewed the triage vital signs and the nursing notes.  Pertinent labs & imaging results that were available during my care of the patient were reviewed by me and considered in my medical decision making (see chart for details).     Unclear etiology of the patient's altered mental status.  He continues to be somewhat altered at this time.  Patient be admitted the hospital for ongoing workup.  At this time I do not think he is having subclinical seizures.  If this persists he may benefit from neurology consultation and EEG but I do not think he needs to be performed acutely at this time.   Workup in emergency department otherwise without significant abnormality.  Final Clinical Impressions(s) / ED Diagnoses   Final diagnoses:  Somnolence    New Prescriptions New Prescriptions   No  medications on file     Jola Schmidt, MD 07/20/16 [phone]

## 2016-07-20 NOTE — ED Notes (Signed)
Pt sleeping with snoring resp- caregiver states this is how he sounds when he sleeps.

## 2016-07-21 ENCOUNTER — Observation Stay (HOSPITAL_COMMUNITY): Payer: Medicare Other

## 2016-07-21 DIAGNOSIS — R4182 Altered mental status, unspecified: Secondary | ICD-10-CM | POA: Diagnosis not present

## 2016-07-21 DIAGNOSIS — R569 Unspecified convulsions: Secondary | ICD-10-CM | POA: Diagnosis not present

## 2016-07-21 DIAGNOSIS — F209 Schizophrenia, unspecified: Secondary | ICD-10-CM

## 2016-07-21 DIAGNOSIS — B20 Human immunodeficiency virus [HIV] disease: Secondary | ICD-10-CM

## 2016-07-21 DIAGNOSIS — R4589 Other symptoms and signs involving emotional state: Secondary | ICD-10-CM

## 2016-07-21 DIAGNOSIS — F319 Bipolar disorder, unspecified: Secondary | ICD-10-CM | POA: Diagnosis not present

## 2016-07-21 DIAGNOSIS — R400 Somnolence: Secondary | ICD-10-CM | POA: Diagnosis not present

## 2016-07-21 LAB — URINALYSIS, ROUTINE W REFLEX MICROSCOPIC
BILIRUBIN URINE: NEGATIVE
Glucose, UA: NEGATIVE mg/dL
HGB URINE DIPSTICK: NEGATIVE
Ketones, ur: NEGATIVE mg/dL
Leukocytes, UA: NEGATIVE
Nitrite: NEGATIVE
Protein, ur: NEGATIVE mg/dL
SPECIFIC GRAVITY, URINE: 1.011 (ref 1.005–1.030)
pH: 6 (ref 5.0–8.0)

## 2016-07-21 LAB — COMPREHENSIVE METABOLIC PANEL
ALT: 18 U/L (ref 17–63)
ANION GAP: 7 (ref 5–15)
AST: 19 U/L (ref 15–41)
Albumin: 3.5 g/dL (ref 3.5–5.0)
Alkaline Phosphatase: 60 U/L (ref 38–126)
BILIRUBIN TOTAL: 0.6 mg/dL (ref 0.3–1.2)
BUN: 17 mg/dL (ref 6–20)
CO2: 27 mmol/L (ref 22–32)
Calcium: 9.2 mg/dL (ref 8.9–10.3)
Chloride: 109 mmol/L (ref 101–111)
Creatinine, Ser: 1.62 mg/dL — ABNORMAL HIGH (ref 0.61–1.24)
GFR, EST AFRICAN AMERICAN: 53 mL/min — AB (ref >60)
GFR, EST NON AFRICAN AMERICAN: 46 mL/min — AB (ref >60)
Glucose, Bld: 100 mg/dL — ABNORMAL HIGH (ref 65–99)
POTASSIUM: 3.8 mmol/L (ref 3.5–5.1)
SODIUM: 143 mmol/L (ref 135–145)
Total Protein: 7.2 g/dL (ref 6.5–8.1)

## 2016-07-21 LAB — RAPID URINE DRUG SCREEN, HOSP PERFORMED
Amphetamines: NOT DETECTED
BARBITURATES: NOT DETECTED
BENZODIAZEPINES: NOT DETECTED
COCAINE: NOT DETECTED
OPIATES: NOT DETECTED
TETRAHYDROCANNABINOL: NOT DETECTED

## 2016-07-21 LAB — TSH: TSH: 0.73 u[IU]/mL (ref 0.350–4.500)

## 2016-07-21 LAB — CBC
HEMATOCRIT: 39.2 % (ref 39.0–52.0)
HEMOGLOBIN: 12.7 g/dL — AB (ref 13.0–17.0)
MCH: 28.8 pg (ref 26.0–34.0)
MCHC: 32.4 g/dL (ref 30.0–36.0)
MCV: 88.9 fL (ref 78.0–100.0)
Platelets: 127 10*3/uL — ABNORMAL LOW (ref 150–400)
RBC: 4.41 MIL/uL (ref 4.22–5.81)
RDW: 13.7 % (ref 11.5–15.5)
WBC: 8.5 10*3/uL (ref 4.0–10.5)

## 2016-07-21 MED ORDER — EMTRICITABINE-TENOFOVIR AF 200-25 MG PO TABS: 1.0000 | ORAL_TABLET | Freq: Every day | ORAL | Status: DC

## 2016-07-21 MED ORDER — DOLUTEGRAVIR SODIUM 50 MG PO TABS: 50.0000 mg | ORAL_TABLET | Freq: Every day | ORAL | 0 refills | Status: DC

## 2016-07-21 MED ORDER — HALOPERIDOL 5 MG PO TABS: 10.0000 mg | ORAL_TABLET | Freq: Every day | ORAL | Status: DC

## 2016-07-21 MED ORDER — GABAPENTIN 600 MG PO TABS: 600.0000 mg | ORAL_TABLET | Freq: Two times a day (BID) | ORAL | Status: DC

## 2016-07-21 MED ORDER — CLONAZEPAM 2 MG PO TABS: 1.0000 mg | ORAL_TABLET | Freq: Every day | ORAL | Status: DC

## 2016-07-21 MED ORDER — DIVALPROEX SODIUM ER 500 MG PO TB24
500.0000 mg | ORAL_TABLET | Freq: Every day | ORAL | Status: DC
  Administered 2016-07-21: 500 mg via ORAL
  Filled 2016-07-21: qty 1

## 2016-07-21 MED ORDER — DOLUTEGRAVIR SODIUM 50 MG PO TABS: 50.0000 mg | ORAL_TABLET | Freq: Every day | ORAL | Status: DC

## 2016-07-21 NOTE — ED Notes (Signed)
[name] from group home came to hospital attempting to locate black binder full of pt info from the group home.  Initially, binder could not be located, [place] asked that we call her or her Daughter (also staff member of group home) when located.  Clair RN from floor did locate the binder and placed it in the [employer] office until staff can pick it up.  RN contacted Sharyn Lull from group home and informed her.  She thanked Therapist, sports

## 2016-07-21 NOTE — Evaluation (Signed)
Physical Therapy Evaluation/Discharge Patient Details Name: Nathan [name] MRN: [id number] DOB: 28-Aug-1960 Today's Date: 07/21/2016   History of Present Illness  Pt is a 56 yo male admitted through ED with possible siezure and AMS. PMH significant for HIV/Aides, Bipolar with agressive behavior, AKI.    Clinical Impression  Pt presents with the above diagnosis for therapy evaluation. Prior to admission, pt was living in a group home but was mostly independent and reports performing all his own ADLs and IADLs. Pt is able to perform all mobility at a Mod I to Supervision level this session with good adherence to safety with gait. Pt will not require any further PT follow up at discharge and is expected to return to PLOF without additional acute services. All education was complete and PT will sign off. If any needs should arise, please reorder PT services.     Follow Up Recommendations No PT follow up    Equipment Recommendations  None recommended by PT    Recommendations for Other Services       Precautions / Restrictions Precautions Precautions: None Restrictions Weight Bearing Restrictions: No      Mobility  Bed Mobility Overal bed mobility: Modified Independent             General bed mobility comments: able to get EOB without assistance  Transfers Overall transfer level: Modified independent Equipment used: None             General transfer comment: Able to stand from EOB with use of UE's, but no physical support  Ambulation/Gait Ambulation/Gait assistance: Supervision Ambulation Distance (Feet): 100 Feet Assistive device: Rolling walker (2 wheeled) Gait Pattern/deviations: Step-through pattern;Decreased step length - right;Decreased step length - left Gait velocity: decreased Gait velocity interpretation: <1.8 ft/sec, indicative of risk for recurrent falls General Gait Details: decreased step length bilaterally and decreased cadence.   Stairs             Wheelchair Mobility    Modified Rankin (Stroke Patients Only)       Balance Overall balance assessment: No apparent balance deficits (not formally assessed)                               Standardized Balance Assessment Standardized Balance Assessment : Dynamic Gait Index   Dynamic Gait Index Level Surface: Normal Change in Gait Speed: Normal Gait with Horizontal Head Turns: Normal Gait with Vertical Head Turns: Mild Impairment Gait and Pivot Turn: Mild Impairment Step Over Obstacle: Mild Impairment Step Around Obstacles: Normal Steps: Mild Impairment Total Score: 20       Pertinent Vitals/Pain Pain Assessment: No/denies pain    Home Living Family/patient expects to be discharged to:: Group home                 Additional Comments: has legal gardian.     Prior Function Level of Independence: Independent         Comments: no limitations in bathing, dressing, or mobility per client.      Hand Dominance   Dominant Hand: Right    Extremity/Trunk Assessment   Upper Extremity Assessment Upper Extremity Assessment: Overall WFL for tasks assessed    Lower Extremity Assessment Lower Extremity Assessment: Overall WFL for tasks assessed    Cervical / Trunk Assessment Cervical / Trunk Assessment: Normal  Communication   Communication: No difficulties  Cognition Arousal/Alertness: Awake/alert Behavior During Therapy: WFL for tasks assessed/performed Overall Cognitive Status: Within Functional  Limits for tasks assessed                                        General Comments      Exercises     Assessment/Plan    PT Assessment Patent does not need any further PT services  PT Problem List         PT Treatment Interventions      PT Goals (Current goals can be found in the Care Plan section)  Acute Rehab PT Goals Patient Stated Goal: none stated    Frequency     Barriers to discharge         Co-evaluation               End of Session Equipment Utilized During Treatment: Gait belt Activity Tolerance: Patient tolerated treatment well Patient left: in chair;with call bell/phone within reach Nurse Communication: Mobility status PT Visit Diagnosis: Difficulty in walking, not elsewhere classified (R26.2)    Time: 1601-0932 PT Time Calculation (min) (ACUTE ONLY): 21 min   Charges:   PT Evaluation $PT Eval Low Complexity: 1 Procedure     PT G Codes:   PT G-Codes **NOT FOR INPATIENT CLASS** Functional Assessment Tool Used: AM-PAC 6 Clicks Basic Mobility;Clinical judgement Functional Limitation: Mobility: Walking and moving around Mobility: Walking and Moving Around Current Status (T5573): 0 percent impaired, limited or restricted Mobility: Walking and Moving Around Goal Status (U2025): 0 percent impaired, limited or restricted Mobility: Walking and Moving Around Discharge Status [phone]): 0 percent impaired, limited or restricted   Scheryl Marten PT, DPT  [phone]   Shanon Rosser 07/21/2016, 1:38 PM

## 2016-07-21 NOTE — Consult Note (Signed)
Date of Admission:  07/20/2016  Date of Consult:  07/21/2016  Reason for Consult: HIV with meds from Group home NOT matching what is being prescribed Referring Physician: Dr. Andria Frames   HPI: Nathan [name] is an 56 y.o. male with history of HIV and AIDS and cognitive disability with problems with aggressive behavior bipolar disorder. Previously been followed by me for some period of time on a regimen of Reyataz NORVIR and Epzicom. We had not seen him's between 2015 2017 during which time he was followed at [facility]. There he was changed over to [facility] and Al[place]. He had maintained excellent virological suppression on this regimen though we did not have a viral load from his visit with me in December. He is apparently missed at least one visit with me since then wise been residing in a group home. He was admitted to the family medicine service with confusion thought to be due to some of his psychotropic medications.  I was consulted due to the fact that there is lack of agreement between the regimen the supposed to be on which is TIVICAY and DESCOVY and what he seems to be receiving from the group home which is TIVICAY DESCOVY and abacavir--which he should not need.  When I visited the patient today he was still a bit confused about several things. He was asking for me to perform fecal disimpaction to help him with his constipation which I deferred to the primary team.   Past Medical History:  Diagnosis Date  . Anemia   . Anemia   . Anxiety   . Bipolar 1 disorder ([place])   . Depression, major   . HIV (human immunodeficiency virus infection) ([place])   . Hypernatremia   . Kidney failure   . Lithium toxicity   . MR (mental retardation)   . Schizophrenia [facility])     Past Surgical History:  Procedure Laterality Date  . BIOPSY N/A 04/12/2014   Procedure: BIOPSY;  Surgeon: Danie Binder, MD;  Location: [facility];  Service: Endoscopy;  Laterality: N/A;  Gastric  . COLONOSCOPY WITH  PROPOFOL N/A 04/12/2014   SLF:  1. one colon polyp removed 2. The left colon is extremely redundant 3. Small internal hemorrhoids  . ESOPHAGOGASTRODUODENOSCOPY (EGD) WITH PROPOFOL N/A 04/12/2014   SLF: Stricture at the gastroesophageal junction 2. small polyp  in teh gastric body 3. Mild Non-erosive gastritis.   Marland Kitchen NOSE SURGERY  1968   unsure what type of surgery. something to help him breathe better  . POLYPECTOMY N/A 04/12/2014   Procedure: POLYPECTOMY;  Surgeon: Danie Binder, MD;  Location: [facility];  Service: Endoscopy;  Laterality: N/A;  Cecal  . SAVORY DILATION N/A 04/12/2014   Procedure: SAVORY DILATION;  Surgeon: Danie Binder, MD;  Location: [facility];  Service: Endoscopy;  Laterality: N/A;  12.8/ 14/15/16  . SKIN BIOPSY      Social History:  reports that he has never smoked. He has never used smokeless tobacco. He reports that he does not drink alcohol or use drugs.   Family History  Problem Relation Age of Onset  . Colon cancer Neg Hx     No Known Allergies   Medications: I have reviewed patients current medications as documented in Epic Anti-infectives    Start     Dose/Rate Route Frequency Ordered Stop   07/21/16 1000  dolutegravir (TIVICAY) tablet 50 mg  Status:  Discontinued     50 mg Oral Daily  07/20/16 2236 07/21/16 1101   07/21/16 1000  emtricitabine-tenofovir AF (DESCOVY) 200-25 MG per tablet 1 tablet  Status:  Discontinued     1 tablet Oral Daily 07/20/16 2236 07/21/16 1101   07/21/16 1000  abacavir (ZIAGEN) tablet 300 mg  Status:  Discontinued     300 mg Oral 2 times daily 07/20/16 2236 07/21/16 1058         ROS: **as in HPI otherwise remainder of 12 point Review of Systems is not obtainable due to patient's confusion   Blood pressure 114/62, pulse 68, temperature 98.7 F (37.1 C), temperature source Oral, resp. rate 20, height 6' (1.829 m), weight 180 lb 8.9 oz (81.9 kg), SpO2 96 %. General: Alert and awake, confused HEENT: anicteric sclera,  EOMI, oropharynx  clear and without exudate poor dentition Cardiovascular: regular rate, normal r Pulmonary:, no wheezing, or respiratory distress Gastrointestinal: soft  nondistended, normal bowel sounds, Skin, soft tissue: no rashes Neuro: nonfocal, strength and sensation intact   Results for orders placed or performed during the hospital encounter of 07/20/16 (from the past 48 hour(s))  CBC with Differential/Platelet     Status: Abnormal   Collection Time: 07/20/16  1:14 PM  Result Value Ref Range   WBC 8.2 4.0 - 10.5 K/uL   RBC 4.36 4.22 - 5.81 MIL/uL   Hemoglobin 12.5 (L) 13.0 - 17.0 g/dL   HCT 38.8 (L) 39.0 - 52.0 %   MCV 89.0 78.0 - 100.0 fL   MCH 28.7 26.0 - 34.0 pg   MCHC 32.2 30.0 - 36.0 g/dL   RDW 13.3 11.5 - 15.5 %   Platelets 124 (L) 150 - 400 K/uL   Neutrophils Relative % 53 %   Neutro Abs 4.3 1.7 - 7.7 K/uL   Lymphocytes Relative 36 %   Lymphs Abs 3.0 0.7 - 4.0 K/uL   Monocytes Relative 9 %   Monocytes Absolute 0.7 0.1 - 1.0 K/uL   Eosinophils Relative 2 %   Eosinophils Absolute 0.2 0.0 - 0.7 K/uL   Basophils Relative 0 %   Basophils Absolute 0.0 0.0 - 0.1 K/uL  Comprehensive metabolic panel     Status: Abnormal   Collection Time: 07/20/16  1:14 PM  Result Value Ref Range   Sodium 138 135 - 145 mmol/L   Potassium 3.8 3.5 - 5.1 mmol/L   Chloride 106 101 - 111 mmol/L   CO2 23 22 - 32 mmol/L   Glucose, Bld 178 (H) 65 - 99 mg/dL   BUN 20 6 - 20 mg/dL   Creatinine, Ser 1.71 (H) 0.61 - 1.24 mg/dL   Calcium 9.1 8.9 - 10.3 mg/dL   Total Protein 7.6 6.5 - 8.1 g/dL   Albumin 3.8 3.5 - 5.0 g/dL   AST 25 15 - 41 U/L   ALT 21 17 - 63 U/L   Alkaline Phosphatase 66 38 - 126 U/L   Total Bilirubin 0.5 0.3 - 1.2 mg/dL   GFR calc non Af Amer 43 (L) >60 mL/min   GFR calc Af Amer 49 (L) >60 mL/min    Comment: (NOTE) The eGFR has been calculated using the CKD EPI equation. This calculation has not been validated in all clinical situations. eGFR's persistently <60 mL/min signify possible  Chronic Kidney Disease.    Anion gap 9 5 - 15  Troponin I     Status: None   Collection Time: 07/20/16  1:14 PM  Result Value Ref Range   Troponin I <0.03 <0.03 ng/mL  Valproic acid  level     Status: None   Collection Time: 07/20/16  1:14 PM  Result Value Ref Range   Valproic Acid Lvl 76 50.0 - 100.0 ug/mL  CBG monitoring, ED     Status: Abnormal   Collection Time: 07/20/16  1:14 PM  Result Value Ref Range   Glucose-Capillary 161 (H) 65 - 99 mg/dL   Comment 1 Notify RN   Ethanol     Status: None   Collection Time: 07/20/16  1:15 PM  Result Value Ref Range   Alcohol, Ethyl (B) <5 <5 mg/dL    Comment:        LOWEST DETECTABLE LIMIT FOR SERUM ALCOHOL IS 5 mg/dL FOR MEDICAL PURPOSES ONLY   Ammonia     Status: Abnormal   Collection Time: 07/20/16  7:14 PM  Result Value Ref Range   Ammonia 52 (H) 9 - 35 umol/L  I-Stat Arterial Blood Gas, ED - (order at [facility] and [facility] only)     Status: Abnormal   Collection Time: 07/20/16  9:39 PM  Result Value Ref Range   pH, Arterial 7.377 7.350 - 7.450   pCO2 arterial 48.9 (H) 32.0 - 48.0 mmHg   pO2, Arterial 73.0 (L) 83.0 - 108.0 mmHg   Bicarbonate 28.7 (H) 20.0 - 28.0 mmol/L   TCO2 30 0 - 100 mmol/L   O2 Saturation 94.0 %   Acid-Base Excess 3.0 (H) 0.0 - 2.0 mmol/L   Patient temperature HIDE    Collection site RADIAL, ALLEN'S TEST ACCEPTABLE    Sample type ARTERIAL   TSH     Status: None   Collection Time: 07/20/16 11:13 PM  Result Value Ref Range   TSH 0.730 0.350 - 4.500 uIU/mL    Comment: Performed by a 3rd Generation assay with a functional sensitivity of <=0.01 uIU/mL.  Comprehensive metabolic panel     Status: Abnormal   Collection Time: 07/21/16  3:53 AM  Result Value Ref Range   Sodium 143 135 - 145 mmol/L   Potassium 3.8 3.5 - 5.1 mmol/L   Chloride 109 101 - 111 mmol/L   CO2 27 22 - 32 mmol/L   Glucose, Bld 100 (H) 65 - 99 mg/dL   BUN 17 6 - 20 mg/dL   Creatinine, Ser 1.62 (H) 0.61 - 1.24 mg/dL   Calcium 9.2 8.9 - 10.3  mg/dL   Total Protein 7.2 6.5 - 8.1 g/dL   Albumin 3.5 3.5 - 5.0 g/dL   AST 19 15 - 41 U/L   ALT 18 17 - 63 U/L   Alkaline Phosphatase 60 38 - 126 U/L   Total Bilirubin 0.6 0.3 - 1.2 mg/dL   GFR calc non Af Amer 46 (L) >60 mL/min   GFR calc Af Amer 53 (L) >60 mL/min    Comment: (NOTE) The eGFR has been calculated using the CKD EPI equation. This calculation has not been validated in all clinical situations. eGFR's persistently <60 mL/min signify possible Chronic Kidney Disease.    Anion gap 7 5 - 15  CBC     Status: Abnormal   Collection Time: 07/21/16  3:53 AM  Result Value Ref Range   WBC 8.5 4.0 - 10.5 K/uL   RBC 4.41 4.22 - 5.81 MIL/uL   Hemoglobin 12.7 (L) 13.0 - 17.0 g/dL   HCT 39.2 39.0 - 52.0 %   MCV 88.9 78.0 - 100.0 fL   MCH 28.8 26.0 - 34.0 pg   MCHC 32.4 30.0 - 36.0 g/dL   RDW  13.7 11.5 - 15.5 %   Platelets 127 (L) 150 - 400 K/uL  Urine rapid drug screen (hosp performed)     Status: None   Collection Time: 07/21/16  5:38 AM  Result Value Ref Range   Opiates NONE DETECTED NONE DETECTED   Cocaine NONE DETECTED NONE DETECTED   Benzodiazepines NONE DETECTED NONE DETECTED   Amphetamines NONE DETECTED NONE DETECTED   Tetrahydrocannabinol NONE DETECTED NONE DETECTED   Barbiturates NONE DETECTED NONE DETECTED    Comment:        DRUG SCREEN FOR MEDICAL PURPOSES ONLY.  IF CONFIRMATION IS NEEDED FOR ANY PURPOSE, NOTIFY LAB WITHIN 5 DAYS.        LOWEST DETECTABLE LIMITS FOR URINE DRUG SCREEN Drug Class       Cutoff (ng/mL) Amphetamine      1000 Barbiturate      200 Benzodiazepine   482 Tricyclics       707 Opiates          300 Cocaine          300 THC              50   Urinalysis, Routine w reflex microscopic     Status: None   Collection Time: 07/21/16  5:39 AM  Result Value Ref Range   Color, Urine YELLOW YELLOW   APPearance CLEAR CLEAR   Specific Gravity, Urine 1.011 1.005 - 1.030   pH 6.0 5.0 - 8.0   Glucose, UA NEGATIVE NEGATIVE mg/dL   Hgb urine  dipstick NEGATIVE NEGATIVE   Bilirubin Urine NEGATIVE NEGATIVE   Ketones, ur NEGATIVE NEGATIVE mg/dL   Protein, ur NEGATIVE NEGATIVE mg/dL   Nitrite NEGATIVE NEGATIVE   Leukocytes, UA NEGATIVE NEGATIVE   _0 (sdes,specrequest,cult,reptstatus)   )No results found for this or any previous visit (from the past 720 hour(s)).   Impression/Recommendation  Active Problems:   Altered mental state   Nathan [name] is a 56 y.o. male with  History of HIV AIDS bipolar 1, the disorder history of aggressive behavior lives in a group home. He has been followed at [facility] with a simplified him from a protease based inhibitor regimen that did also include abacavir 2 TIVICAY and DESCOVY. I continue that regimen and the regimen seems to have been followed in part but he also seems to be receiving extra medications that he does not need namely the abacavir. He is also not been seen by me in the clinic either perhaps due group home not knowing when his appointment was.  #1 HIV disease with history of AIDS: Agree with repeating viral load and CD4 count 8 keep in mind the viral loads that a been drawn in the hospital or not always process promptly and waking get a low level viremia sometimes due to lab error.  I have DC The ABACAVIR (this was not rx by last [facility] visit or by myself. Perhaps [facility] can [facility] and [facility] to the group home?  I WOULD ENSURE GROUP HOME KNOWS TO JUST GIVE HIM [facility] which we will likely change to STR of BIKTARVY as an outpatient  I will ensure that he has a HSFU with me in the clinic as well as with pharmacy shortly after [place] PHONE [place] IS IN EPIC IN NOTE OR DEMOGRAPHICS SO WE CAN BE SURE TO arrange HSFU  I will sign off for now please call  with any questions.   07/21/2016, 1:32 PM   Thank you so much for this interesting consult  [place] for [place] [phone] (pager) [phone] (office) 07/21/2016, 1:32 PM  Rhina [place] 07/21/2016, 1:32 PM

## 2016-07-21 NOTE — Progress Notes (Signed)
Patient denies pain. Patient PIV x 2 removed. All patients discharge instructions and medication changes reviewed, signed and verbally confirmed by patients caregiver. Patient discharge packet given directly to patient caregiver. Caregivers "blue notebook" returned to caregiver. Duplicate copies of patient information and signed discharge instruction placed in patients chart. Patient belongings checked and confirmed by caregiver. Patient discharged and transported via wheelchair by NT to exit.

## 2016-07-21 NOTE — Progress Notes (Signed)
Family Medicine Teaching Service Daily Progress Note Intern Pager: [phone]  Patient name: Nathan [name] Medical record number: [id number] Date of birth: Dec 16, 1960 Age: 56 y.o. Gender: male  Primary Care Provider: Javier Docker, MD Consultants: Infectious Disease Code Status: Full  Pt Overview and Major Events to Date:  - Admitted 4/14 for AMS - 4/15: now AOx4  Assessment and Plan: Nathan [name] is a 56 y.o. male presenting with altered mentation. PMH is significant for AIDS, bipolar 1, cognitive disability, schizophrenia, CKD III, GERD, lives in group home  Altered mentation/Somnolence, resolved - Uncertain etiology. Suspect post-ictal vs over-medication. GCS initially 9 (will inconsistently open eyes to pain/command; had minimal verbal response on admission; localizes pain and inconsistently will grip hands). Now GCS 15. Became more alert to ammonia on admission. Stroke or head trauma considered but given full recovery and negative CT head, unlikely. Infection considered, though less likely with no leukocytosis or fevers. CXR negative. Intoxication considered, EtOH level negative. Ammonia level somewhat elevated at 52. Patient is not diabetic and was not. Depakote level WNL at 76. No improvement s/p narcan in ED. UDS negative. TSH 0.730. ABG with pH 7.377 - Admitted to [place] attending Dr. Andria Frames for altered mentation workup and treatment - held sedating medications: haloperidol, divalproex (Depakote), gabapentin, benztropine (Cogentin) - continuous pulse ox - Will d/c CIWA  - Prn ativan IV in case of seizure  - PT ordered - SW consulted for return to [place]; medically stable for d/c today  HIV with AIDS - followed by Dr. Tommy Medal, most recently seen 03/2016. Home medications on MAR include Abacavir (Ziagen) 300 mg BID (not in most recent ID note) and emtricitabine-tenofovir (Descovy) 200-25 mg Qd and Dolutegravir (Ticavay) noted in most recent ID note. Last labs obtained  03/13/16 with HIV RNA < 20 and CD4 count ab 840.  - Consult ID in AM for further clarification/recommendations - Obtain CD4 count, viral load  Bipolar 1/Hx aggressive behavior - Home regimen includes Clonazepam 2 mg PM, benztropine 0.5 mg BID, Depakote [phone] mg QD, Haloperidol 15 mg QD, Clozapine 200 mg QD - Will restart depakote and recommend decreasing nightly clonazepam to 1 mg and haldol to 10 mg upon discharge  Chronic Constipation - continue home bowel regimen senna and miralax  AKI on CKD stage 3, stable - Baseline Cr ~1.5-1.6, slightly elevated at 1.71 on admission. May be prerenal given has not eaten all day  - avoid nephrotoxic agents - consider renally dosing gabapentin at DC - AM BMP - IV fluids  Diffuse ST elevations on EKG: Could suggest pericarditis but no distant heart sounds, fever, or complaints prior to change in MS and appears to have been present on past EKGs (06/21/14) - Repeat EKG in AM  FEN/GI: Regular diet Prophylaxis: lovenox  Disposition: Discharge to Group Home once ID has seen to reconcile his HIV medication regimen.  Subjective:  Patient feels well this morning and is eager to eat. He remembers having trouble with stairs at church yesterday but not much else. He thinks he had a seizure but says episode yesterday did not feel similar to past episodes, though it has been many years since he has had a seizure.   Objective: Temp:  [97.1 F (36.2 C)-98.7 F (37.1 C)] 98.7 F (37.1 C) (04/15 0921) Pulse Rate:  [65-80] 68 (04/15 0921) Resp:  [12-20] 20 (04/15 0921) BP: (100-123)/(55-79) 114/62 (04/15 0921) SpO2:  [94 %-99 %] 96 % (04/15 0921) Weight:  [180 lb 8.9 oz (  81.9 kg)-181 lb (82.1 kg)] 180 lb 8.9 oz (81.9 kg) (04/14 2230) Physical Exam: General: Alert, pleasant, sitting up in bed Cardiovascular: RRR, S1, S2, no m/r/g Respiratory: CTAB, no increased WOB Abdomen: +BS, soft, mildly TTP over lower abdomen (says needed to go to  bathroom) Neuro: AOx3, speech clear, CNII-XII intact.  Extremities: Moves all spontaneously  Laboratory:  Recent Labs Lab 07/20/16 1314 07/21/16 0353  WBC 8.2 8.5  HGB 12.5* 12.7*  HCT 38.8* 39.2  PLT 124* 127*    Recent Labs Lab 07/20/16 1314 07/21/16 0353  NA 138 143  K 3.8 3.8  CL 106 109  CO2 23 27  BUN 20 17  CREATININE 1.71* 1.62*  CALCIUM 9.1 9.2  PROT 7.6 7.2  BILITOT 0.5 0.6  ALKPHOS 66 60  ALT 21 18  AST 25 19  GLUCOSE 178* 100*    Imaging/Diagnostic Tests: X-ray Chest Pa And Lateral  Result Date: 07/21/2016 CLINICAL DATA:  Altered mental status and seizures.  HIV EXAM: CHEST  2 VIEW COMPARISON:  06/20/2014 and prior exams FINDINGS: The cardiomediastinal silhouette is unremarkable. There is no evidence of focal airspace disease, pulmonary edema, suspicious pulmonary nodule/mass, pleural effusion, or pneumothorax. No acute bony abnormalities are identified. IMPRESSION: No active cardiopulmonary disease. Electronically Signed   By: Margarette Canada M.D.   On: 07/21/2016 09:26   Ct Head Wo Contrast  Result Date: 07/20/2016 CLINICAL DATA:  Unresponsiveness.  History of HIV. EXAM: CT HEAD WITHOUT CONTRAST TECHNIQUE: Contiguous axial images were obtained from the base of the skull through the vertex without intravenous contrast. COMPARISON:  05/05/2011 head CT. FINDINGS: Brain: No evidence of parenchymal hemorrhage or extra-axial fluid collection. No mass lesion, mass effect, or midline shift. No CT evidence of acute infarction. Cerebral volume is age appropriate. No ventriculomegaly. Vascular: No hyperdense vessel or unexpected calcification. Skull: No evidence of calvarial fracture. Sinuses/Orbits: Complete left frontal sinus opacification, unchanged. Mild mucoperiosteal thickening in the ethmoidal air cells, not appreciably changed. Tiny mucous retention cysts versus polyps in the right maxillary sinus. No fluid levels. Dysconjugate gaze. Other:  The mastoid air cells are  unopacified. IMPRESSION: 1.  No evidence of acute intracranial abnormality. 2. Dysconjugate gaze. 3. Chronic paranasal sinusitis. Electronically Signed   By: Ilona Sorrel M.D.   On: 07/20/2016 16:23    Kesa Birky Corinda Gubler, MD 07/21/2016, 10:12 AM PGY-2, [place] Intern pager: [phone], text pages welcome

## 2016-07-21 NOTE — Discharge Summary (Signed)
[facility] Discharge Summary  Patient name: Nathan [name] Medical record number: [id number] Date of birth: 09/21/1960 Age: 56 y.o. Gender: male Date of Admission: 07/20/2016  Date of Discharge: 07/21/2016 Admitting Physician: Zenia Resides, MD  Primary Care Provider: Javier Docker, MD Consultants: Infectious Disease  Indication for Hospitalization: AMS/Somnolence  Discharge Diagnoses/Problem List:  Active Problems:   Altered mental state   Bipolar 1 disorder ([place])   Schizophrenia ([place])   Seizures ([facility])   Schizophrenia   Bipolar   HIV with AIDS   CKDIII   Cognitive Disability  Disposition: Group Home  Discharge Condition: Improved  Discharge Exam:  Blood pressure 114/62, pulse 68, temperature 98.7 F (37.1 C), temperature source Oral, resp. rate 20, height 6' (1.829 m), weight 180 lb 8.9 oz (81.9 kg), SpO2 96 %. Physical Exam: General: Alert, pleasant, sitting up in bed Cardiovascular: RRR, S1, S2, no m/r/g Respiratory: CTAB, no increased WOB Abdomen: +BS, soft, mildly TTP over lower abdomen (says needed to go to bathroom) Neuro: AOx3, speech clear, CNII-XII intact.  Extremities: Moves all spontaneously  Brief Hospital Course:  Nathan [name] is a 56 y.o. male who presented with altered mentation. PMH is significant for AIDS, bipolar 1, remote seizure history, cognitive disability, schizophrenia, CKD III, GERD, lives in group home.  On presentation, patient had GCS of 9-11. Had been well morning of presentation. He had negative CT head for acute bleed and no history of trauma. No evidence of infection with no leukocytosis or fever, though does have history of HIV but CD4 count in 800s in December 2017. No improvement with narcan. EtOH level negative. He did respond somewhat to ammonia and intermittently followed commands. Ammonia only slightly elevated at 52. Depakote level was WNL at 76. He improved over the course of observation  overnight by holding all medications and was AOx3 at time of discharge.   Suspect patient may have been post-ictal. Heavy burden of antipsychotic and sedating medications may also have contributed. Recommend continuing depakote at previous dose since within therapeutic range. Would decrease gabapentin to 600 mg BID due to kidney function; could need further decrease if becomes altered again but may help with seizure control. Recommend decreasing haldol to 10 mg nightly from 15 mg and decreasing klonopin to 1 mg nightly instead of 2 mg.  ID clarified that patient's HIV medication regimen is tivicay and descovy (which will likely be changed to biktarvy as outpatient), no abacavir, and he should follow-up with both ID as well as pharmacy.   Issues for Follow Up:  1. Monitor for episodes of decreased alertness 2. Continue to wean down sedative and antipsychotic medications as able; would likely benefit from outpatient Psychiatry follow-up 3. Patient needs to follow up with Infectious Disease and with pharmacy regarding his HIV medications. Please make sure best phone number for group home is in Epic so that ID can arrange follow up. 4. HIV viral load and CD4 count pending. Per ID, viral loads drawn in hospital are not always accurate.  Significant Procedures: None  Significant Labs and Imaging:   Recent Labs Lab 07/20/16 1314 07/21/16 0353  WBC 8.2 8.5  HGB 12.5* 12.7*  HCT 38.8* 39.2  PLT 124* 127*    Recent Labs Lab 07/20/16 1314 07/21/16 0353  NA 138 143  K 3.8 3.8  CL 106 109  CO2 23 27  GLUCOSE 178* 100*  BUN 20 17  CREATININE 1.71* 1.62*  CALCIUM 9.1 9.2  ALKPHOS 66 60  AST 25 19  ALT 21 18  ALBUMIN 3.8 3.5   X-ray Chest Pa And Lateral  Result Date: 07/21/2016 CLINICAL DATA:  Altered mental status and seizures.  HIV EXAM: CHEST  2 VIEW COMPARISON:  06/20/2014 and prior exams FINDINGS: The cardiomediastinal silhouette is unremarkable. There is no evidence of focal  airspace disease, pulmonary edema, suspicious pulmonary nodule/mass, pleural effusion, or pneumothorax. No acute bony abnormalities are identified. IMPRESSION: No active cardiopulmonary disease. Electronically Signed   By: Margarette Canada M.D.   On: 07/21/2016 09:26   Ct Head Wo Contrast  Result Date: 07/20/2016 CLINICAL DATA:  Unresponsiveness.  History of HIV. EXAM: CT HEAD WITHOUT CONTRAST TECHNIQUE: Contiguous axial images were obtained from the base of the skull through the vertex without intravenous contrast. COMPARISON:  05/05/2011 head CT. FINDINGS: Brain: No evidence of parenchymal hemorrhage or extra-axial fluid collection. No mass lesion, mass effect, or midline shift. No CT evidence of acute infarction. Cerebral volume is age appropriate. No ventriculomegaly. Vascular: No hyperdense vessel or unexpected calcification. Skull: No evidence of calvarial fracture. Sinuses/Orbits: Complete left frontal sinus opacification, unchanged. Mild mucoperiosteal thickening in the ethmoidal air cells, not appreciably changed. Tiny mucous retention cysts versus polyps in the right maxillary sinus. No fluid levels. Dysconjugate gaze. Other:  The mastoid air cells are unopacified. IMPRESSION: 1.  No evidence of acute intracranial abnormality. 2. Dysconjugate gaze. 3. Chronic paranasal sinusitis. Electronically Signed   By: Ilona Sorrel M.D.   On: 07/20/2016 16:23     Results/Tests Pending at Time of Discharge: CD4 count, HIV 1 RNA quant  Discharge Medications:  Allergies as of 07/21/2016   No Known Allergies     Medication List    STOP taking these medications   abacavir 300 MG tablet Commonly known as:  ZIAGEN     TAKE these medications   benztropine 0.5 MG tablet Commonly known as:  COGENTIN Take 0.5 mg by mouth 2 (two) times daily.   clonazePAM 2 MG tablet Commonly known as:  KLONOPIN Take 0.5 tablets (1 mg total) by mouth at bedtime. What changed:  how much to take   clozapine 100 MG  disintegrating tablet Commonly known as:  FAZACLO Take 200 mg by mouth daily with supper.   divalproex 250 MG 24 hr tablet Commonly known as:  DEPAKOTE ER Take 500-1,000 mg by mouth daily. 500mg  in the am 1000mg  in the pm   dolutegravir 50 MG tablet Commonly known as:  TIVICAY Take 1 tablet (50 mg total) by mouth daily.   emtricitabine-tenofovir AF 200-25 MG tablet Commonly known as:  DESCOVY Take 1 tablet by mouth daily.   folic acid 1 MG tablet Commonly known as:  FOLVITE Take 1 mg by mouth daily.   gabapentin 600 MG tablet Commonly known as:  NEURONTIN Take 1 tablet (600 mg total) by mouth 2 (two) times daily. What changed:  how much to take   haloperidol 5 MG tablet Commonly known as:  HALDOL Take 2 tablets (10 mg total) by mouth daily with supper. What changed:  how much to take   ipratropium 0.03 % nasal spray Commonly known as:  ATROVENT 1 spray 2 (two) times daily. UNDER THE TONGUE   polyethylene glycol packet Commonly known as:  MIRALAX / GLYCOLAX Take 17 g by mouth 2 (two) times daily as needed for mild constipation or moderate constipation.   ranitidine 150 MG tablet Commonly known as:  ZANTAC TAKE 1 TABLET BY MOUTH 12 HOURS AFTER TAKING REYATAZ.  senna 8.6 MG tablet Commonly known as:  SENOKOT Take 2 tablets by mouth 2 (two) times daily.   sodium chloride 0.65 % Soln nasal spray Commonly known as:  OCEAN Place 1 spray into both nostrils as needed for congestion.       Discharge Instructions: Please refer to Patient Instructions section of EMR for full details.  Patient was counseled important signs and symptoms that should prompt return to medical care, changes in medications, dietary instructions, activity restrictions, and follow up appointments.   Follow-Up Appointments: Follow-up Information    Javier Docker, MD. Schedule an appointment as soon as possible for a visit.   Specialty:  Internal Medicine Why:  for hospital  follow-up Contact information: [address] [place] [address] [phone]        Cornelius Van Dam, MD. Schedule an appointment as soon as possible for a visit.   Specialty:  Infectious Diseases Why:  Missed March check-up Contact information: [address] [place] [address] [phone]           Aalyah Mansouri J Nijah Tejera, DO 07/22/2016, 12:12 AM PGY-1, [place]

## 2016-07-22 LAB — HIV-1 RNA QUANT-NO REFLEX-BLD
HIV 1 RNA Quant: <20 copies/mL
LOG10 HIV-1 RNA: UNDETERMINED {Log_copies}/mL

## 2016-07-22 LAB — T-HELPER CELLS (CD4) COUNT (NOT AT ARMC)
CD4 % Helper T Cell: 31 % — ABNORMAL LOW (ref 33–55)
CD4 T CELL ABS: 610 /uL (ref 400–2700)

## 2016-08-28 ENCOUNTER — Ambulatory Visit (INDEPENDENT_AMBULATORY_CARE_PROVIDER_SITE_OTHER): Payer: Medicare Other | Admitting: Infectious Disease

## 2016-08-28 ENCOUNTER — Encounter: Payer: Self-pay | Admitting: Infectious Disease

## 2016-08-28 VITALS — BP 135/78 | HR 75 | Temp 97.9°F | Ht 69.0 in | Wt 186.0 lb

## 2016-08-28 DIAGNOSIS — F79 Unspecified intellectual disabilities: Secondary | ICD-10-CM | POA: Diagnosis not present

## 2016-08-28 DIAGNOSIS — N183 Chronic kidney disease, stage 3 unspecified: Secondary | ICD-10-CM

## 2016-08-28 DIAGNOSIS — K219 Gastro-esophageal reflux disease without esophagitis: Secondary | ICD-10-CM | POA: Diagnosis not present

## 2016-08-28 DIAGNOSIS — R569 Unspecified convulsions: Secondary | ICD-10-CM

## 2016-08-28 DIAGNOSIS — B20 Human immunodeficiency virus [HIV] disease: Secondary | ICD-10-CM | POA: Diagnosis present

## 2016-08-28 DIAGNOSIS — F3162 Bipolar disorder, current episode mixed, moderate: Secondary | ICD-10-CM

## 2016-08-28 DIAGNOSIS — G9340 Encephalopathy, unspecified: Secondary | ICD-10-CM

## 2016-08-28 MED ORDER — EMTRICITABINE-TENOFOVIR AF 200-25 MG PO TABS: 1.0000 | ORAL_TABLET | Freq: Every day | ORAL | 11 refills | Status: DC

## 2016-08-28 MED ORDER — DOLUTEGRAVIR SODIUM 50 MG PO TABS: 50.0000 mg | ORAL_TABLET | Freq: Every day | ORAL | 11 refills | Status: DC

## 2016-08-28 NOTE — Progress Notes (Signed)
Subjective:   Chief complaint reestablish HIV care  Patient ID: Nathan [name], male    DOB: 12-Feb-1961, 56 y.o.   MRN: [id number]  HPI  Nathan [name] is a 56 year old man with multiple medical problems including HIV AIDS, cognitive disability with aggressive behavior bipolar disorder who had been followed here for some time on [place] NORVIR and Epzicom then had not been seen since 2015 in the interim having been followed at [facility] as recently as this fall in 2017. He was changed to [facility] and does Descovy has been nicely suppressed on this regimen. He was incarcerated at [facility] for some time and now has been released and wanted to reestablish care here in [place].  He has done very well on this regimen THOUGH HIS ABACAVIR was never stoppped at the SNF where he currently resides !!  Unfortunately Arman Bogus at [facility] is [place] of his move to [place] to SNF and to care at [facility]  He was admitted to [facility] In April with AMS thought possibly due to excess sedation vs post ictal state. I also noted he should not be on ABC then and DC'd but it has been started ONCE AGAIN.  He has no complaints today.  Lab Results  Component Value Date   HIV1RNAQUANT <20 07/21/2016   HIV1RNAQUANT <20 10/31/2014   HIV1RNAQUANT <20 12/08/2013   Lab Results  Component Value Date   CD4TABS 610 07/21/2016   CD4TABS 840 03/13/2016   CD4TABS 690 10/31/2014     Past Medical History:  Diagnosis Date  . Anemia   . Anemia   . Anxiety   . Bipolar 1 disorder ([place])   . Depression, major   . HIV (human immunodeficiency virus infection) ([place])   . Hypernatremia   . Kidney failure   . Lithium toxicity   . MR (mental retardation)   . Schizophrenia [facility])     Past Surgical History:  Procedure Laterality Date  . BIOPSY N/A 04/12/2014   Procedure: BIOPSY;  Surgeon: Danie Binder, MD;  Location: [facility];  Service: Endoscopy;  Laterality: N/A;  Gastric  . COLONOSCOPY WITH PROPOFOL N/A 04/12/2014   SLF:   1. one colon polyp removed 2. The left colon is extremely redundant 3. Small internal hemorrhoids  . ESOPHAGOGASTRODUODENOSCOPY (EGD) WITH PROPOFOL N/A 04/12/2014   SLF: Stricture at the gastroesophageal junction 2. small polyp  in teh gastric body 3. Mild Non-erosive gastritis.   Marland Kitchen NOSE SURGERY  1968   unsure what type of surgery. something to help him breathe better  . POLYPECTOMY N/A 04/12/2014   Procedure: POLYPECTOMY;  Surgeon: Danie Binder, MD;  Location: [facility];  Service: Endoscopy;  Laterality: N/A;  Cecal  . SAVORY DILATION N/A 04/12/2014   Procedure: SAVORY DILATION;  Surgeon: Danie Binder, MD;  Location: [facility];  Service: Endoscopy;  Laterality: N/A;  12.8/ 14/15/16  . SKIN BIOPSY      Family History  Problem Relation Age of Onset  . Colon cancer Neg Hx       Social History   Social History  . Marital status: Single    Spouse name: N/A  . Number of children: N/A  . Years of education: N/A   Social History Main Topics  . Smoking status: Never Smoker  . Smokeless tobacco: Never Used  . Alcohol use No  . Drug use: No  . Sexual activity: No   Other Topics Concern  . Not on file   Social History Narrative  .  No narrative on file    No Known Allergies   Current Outpatient Prescriptions:  .  benztropine (COGENTIN) 0.5 MG tablet, Take 0.5 mg by mouth 2 (two) times daily., Disp: , Rfl:  .  clonazePAM (KLONOPIN) 2 MG tablet, Take 0.5 tablets (1 mg total) by mouth at bedtime., Disp: , Rfl:  .  clozapine (FAZACLO) 100 MG disintegrating tablet, Take 200 mg by mouth daily with supper. , Disp: , Rfl:  .  divalproex (DEPAKOTE ER) 250 MG 24 hr tablet, Take 500-1,000 mg by mouth daily. 500mg  in the am 1000mg  in the pm, Disp: , Rfl:  .  dolutegravir (TIVICAY) 50 MG tablet, Take 1 tablet (50 mg total) by mouth daily., Disp: 30 tablet, Rfl: 0 .  emtricitabine-tenofovir AF (DESCOVY) 200-25 MG tablet, Take 1 tablet by mouth daily., Disp: 30 tablet, Rfl: 11 .  folic acid (FOLVITE)  1 MG tablet, Take 1 mg by mouth daily., Disp: , Rfl:  .  gabapentin (NEURONTIN) 600 MG tablet, Take 1 tablet (600 mg total) by mouth 2 (two) times daily., Disp: , Rfl:  .  haloperidol (HALDOL) 5 MG tablet, Take 2 tablets (10 mg total) by mouth daily with supper., Disp: , Rfl:  .  ipratropium (ATROVENT) 0.03 % nasal spray, 1 spray 2 (two) times daily. UNDER THE TONGUE, Disp: , Rfl:  .  polyethylene glycol (MIRALAX / GLYCOLAX) packet, Take 17 g by mouth 2 (two) times daily as needed for mild constipation or moderate constipation. , Disp: , Rfl:  .  ranitidine (ZANTAC) 150 MG tablet, TAKE 1 TABLET BY MOUTH 12 HOURS AFTER TAKING REYATAZ., Disp: 30 tablet, Rfl: 3 .  senna (SENOKOT) 8.6 MG tablet, Take 2 tablets by mouth 2 (two) times daily., Disp: , Rfl:  .  sodium chloride (OCEAN) 0.65 % SOLN nasal spray, Place 1 spray into both nostrils as needed for congestion., Disp: , Rfl:    Review of Systems  Unable to perform ROS: Psychiatric disorder       Objective:   Physical Exam  Constitutional: He is oriented to person, place, and time. He appears well-developed and well-nourished. No distress.  HENT:  Head: Normocephalic and atraumatic.  Mouth/Throat: No oropharyngeal exudate.    Eyes: Conjunctivae and EOM are normal. No scleral icterus.  Neck: Normal range of motion. Neck supple.  Cardiovascular: Normal rate and regular rhythm.   Pulmonary/Chest: Effort normal. No respiratory distress. He has no wheezes.  Abdominal: He exhibits no distension.  Musculoskeletal: He exhibits no edema or tenderness.  Neurological: He is alert and oriented to person, place, and time. He exhibits normal muscle tone. Coordination normal.  Skin: Skin is warm and dry. No rash noted. He is not diaphoretic. No erythema. No pallor.  Psychiatric: He has a normal mood and affect. His behavior is normal. Thought content normal. His speech is delayed and slurred. Cognition and memory are impaired.          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  Chronic kidney disease renal stable  Bipolar disorder continue VPA  AMS: likely due to excess sedation  GERD: DROP dose of zantac to daily

## 2016-12-28 ENCOUNTER — Ambulatory Visit (HOSPITAL_COMMUNITY)
Admission: EM | Admit: 2016-12-28 | Discharge: 2016-12-28 | Disposition: A | Discharge status: Home / Self Care | Payer: Medicare Other | Attending: Urgent Care | Admitting: Urgent Care

## 2016-12-28 ENCOUNTER — Encounter (HOSPITAL_COMMUNITY): Payer: Self-pay | Admitting: Family Medicine

## 2016-12-28 DIAGNOSIS — R059 Cough, unspecified: Secondary | ICD-10-CM

## 2016-12-28 DIAGNOSIS — R0981 Nasal congestion: Secondary | ICD-10-CM

## 2016-12-28 DIAGNOSIS — J22 Unspecified acute lower respiratory infection: Secondary | ICD-10-CM | POA: Diagnosis not present

## 2016-12-28 DIAGNOSIS — R0789 Other chest pain: Secondary | ICD-10-CM

## 2016-12-28 DIAGNOSIS — R05 Cough: Secondary | ICD-10-CM

## 2016-12-28 MED ORDER — CETIRIZINE HCL 10 MG PO TABS: 10.0000 mg | ORAL_TABLET | Freq: Every day | ORAL | 11 refills | Status: DC

## 2016-12-28 MED ORDER — AZITHROMYCIN 250 MG PO TABS: ORAL_TABLET | ORAL | 0 refills | Status: DC

## 2016-12-28 MED ORDER — PREDNISONE 20 MG PO TABS: ORAL_TABLET | ORAL | 0 refills | Status: DC

## 2016-12-28 NOTE — ED Triage Notes (Signed)
Pt here for cough, cold and congestion x 1 month.

## 2016-12-28 NOTE — ED Provider Notes (Signed)
MRN: [id number] DOB: [birth year]  Subjective:   Nathan [name] is a 56 y.o. male presenting for chief complaint of Cough  Reports 4 week history of dry cough. Cough elicits chest, stomach and back pain and shortness of breath. Has also had subjective fever, nasal congestion, scratchy throat, . Has tried otc medications with minimal relief. Denies sinus pain, ear pain, sore throat, n/v, rashes. Denies smoking cigarettes.  No current facility-administered medications for this encounter.   Current Outpatient Prescriptions:  .  benztropine (COGENTIN) 0.5 MG tablet, Take 1 mg by mouth 2 (two) times daily. , Disp: , Rfl:  .  clonazePAM (KLONOPIN) 2 MG tablet, Take 0.5 tablets (1 mg total) by mouth at bedtime. (Patient not taking: Reported on 08/28/2016), Disp: , Rfl:  .  clozapine (FAZACLO) 100 MG disintegrating tablet, Take 100 mg by mouth 2 (two) times daily. , Disp: , Rfl:  .  divalproex (DEPAKOTE ER) 250 MG 24 hr tablet, Take 500-1,000 mg by mouth daily. 500mg  in the am 1000mg  in the pm, Disp: , Rfl:  .  dolutegravir (TIVICAY) 50 MG tablet, Take 1 tablet (50 mg total) by mouth daily., Disp: 30 tablet, Rfl: 11 .  emtricitabine-tenofovir AF (DESCOVY) 200-25 MG tablet, Take 1 tablet by mouth daily., Disp: 30 tablet, Rfl: 11 .  folic acid (FOLVITE) 1 MG tablet, Take 1 mg by mouth daily., Disp: , Rfl:  .  gabapentin (NEURONTIN) 300 MG capsule, Take 300 mg by mouth every morning. With 400mg  capsule, Disp: , Rfl:  .  gabapentin (NEURONTIN) 400 MG capsule, Take 400 mg by mouth 2 (two) times daily. Take 1 capsule in AM with gabapentin 300mg , take 1 capsule in PM, Disp: , Rfl:  .  haloperidol (HALDOL) 5 MG tablet, Take 2 tablets (10 mg total) by mouth daily with supper., Disp: , Rfl:  .  ipratropium (ATROVENT) 0.03 % nasal spray, 1 spray 2 (two) times daily. UNDER THE TONGUE, Disp: , Rfl:  .  polyethylene glycol (MIRALAX / GLYCOLAX) packet, Take 17 g by mouth 2 (two) times daily as needed for mild  constipation or moderate constipation. , Disp: , Rfl:  .  ranitidine (ZANTAC) 150 MG tablet, TAKE 1 TABLET BY MOUTH 12 HOURS AFTER TAKING REYATAZ., Disp: 30 tablet, Rfl: 3 .  senna (SENOKOT) 8.6 MG tablet, Take 2 tablets by mouth 2 (two) times daily., Disp: , Rfl:  .  sodium chloride (OCEAN) 0.65 % SOLN nasal spray, Place 1 spray into both nostrils as needed for congestion., Disp: , Rfl:    [place]  has No Known Allergies.  [place]  has a past medical history of Anemia; Anemia; Anxiety; Bipolar 1 disorder ([place]); Depression, major; HIV (human immunodeficiency virus infection) ([place]); Hypernatremia; Kidney failure; Lithium toxicity; MR (mental retardation); and Schizophrenia ([place]). Also  has a past surgical history that includes Nose surgery (1968); Colonoscopy with propofol (N/A, 04/12/2014); Esophagogastroduodenoscopy (egd) with propofol (N/A, 04/12/2014); Savory dilation (N/A, 04/12/2014); polypectomy (N/A, 04/12/2014); biopsy (N/A, 04/12/2014); and Skin biopsy.  Objective:   Vitals: BP 123/73   Pulse 88   Temp 98.3 F (36.8 C)   Resp 18   SpO2 97%   Physical Exam  Constitutional: He is oriented to person, place, and time. He appears well-developed and well-nourished.  HENT:  TM's intact bilaterally, no effusions or erythema. Frank mucus draining from his nose. Nasal turbinates boggy and edematous, nasal passages minimally patent. No sinus tenderness. Oropharynx with mild post-nasal drainage but no exudates, tonsillar erythema or swelling, mucous membranes  moist.  Eyes: Right eye exhibits no discharge. Left eye exhibits no discharge.  Neck: Normal range of motion. Neck supple.  Cardiovascular: Normal rate, regular rhythm and intact distal pulses.  Exam reveals no gallop and no friction rub.   No murmur heard. Pulmonary/Chest: No respiratory distress. He has no wheezes. He has no rales.  Coarse lung sounds over mid-lower lung fields, L>R.  Lymphadenopathy:    He has no cervical adenopathy.   Neurological: He is alert and oriented to person, place, and time.    Assessment and Plan :   Lower respiratory infection  Cough  Atypical chest pain  Nasal congestion  Will cover for lower respiratory infection with azithromycin and short steroid course. Counseled patient on potential for adverse effects with medications prescribed today, patient verbalized understanding. Start Zyrtec for chronic nasal congestion. Return-to-clinic precautions discussed, patient verbalized understanding.   Jaynee Eagles, PA-C [facility]  12/28/2016  6:41 PM    Jaynee Eagles, PA-C 12/28/16 [id number]

## 2016-12-30 ENCOUNTER — Ambulatory Visit: Payer: Medicare Other | Admitting: Infectious Disease

## 2017-04-03 ENCOUNTER — Other Ambulatory Visit: Payer: Self-pay | Admitting: Infectious Disease

## 2017-04-15 ENCOUNTER — Other Ambulatory Visit: Payer: Self-pay | Admitting: Family Medicine

## 2017-07-10 NOTE — Addendum Note (Signed)
Addended by: Janyce Llanos F on: 07/10/2017 12:17 PM   Modules accepted: Orders

## 2017-07-21 ENCOUNTER — Other Ambulatory Visit: Payer: Medicare Other

## 2017-07-21 ENCOUNTER — Other Ambulatory Visit (HOSPITAL_COMMUNITY)
Admission: RE | Admit: 2017-07-21 | Discharge: 2017-07-21 | Disposition: A | Discharge status: Home / Self Care | Payer: Medicare Other | Source: Ambulatory Visit | Attending: Infectious Disease | Admitting: Infectious Disease

## 2017-07-21 DIAGNOSIS — B20 Human immunodeficiency virus [HIV] disease: Secondary | ICD-10-CM

## 2017-07-21 DIAGNOSIS — N183 Chronic kidney disease, stage 3 unspecified: Secondary | ICD-10-CM

## 2017-07-21 DIAGNOSIS — G9340 Encephalopathy, unspecified: Secondary | ICD-10-CM

## 2017-07-21 DIAGNOSIS — F79 Unspecified intellectual disabilities: Secondary | ICD-10-CM | POA: Insufficient documentation

## 2017-07-21 DIAGNOSIS — R569 Unspecified convulsions: Secondary | ICD-10-CM | POA: Diagnosis present

## 2017-07-21 DIAGNOSIS — K219 Gastro-esophageal reflux disease without esophagitis: Secondary | ICD-10-CM

## 2017-07-21 DIAGNOSIS — F3162 Bipolar disorder, current episode mixed, moderate: Secondary | ICD-10-CM

## 2017-07-22 LAB — COMPLETE METABOLIC PANEL WITH GFR
AG RATIO: 1.3 (calc) (ref 1.0–2.5)
ALBUMIN MSPROF: 4.4 g/dL (ref 3.6–5.1)
ALKALINE PHOSPHATASE (APISO): 77 U/L (ref 40–115)
ALT: 13 U/L (ref 9–46)
AST: 14 U/L (ref 10–35)
BUN / CREAT RATIO: 6 (calc) (ref 6–22)
BUN: 12 mg/dL (ref 7–25)
CHLORIDE: 106 mmol/L (ref 98–110)
CO2: 29 mmol/L (ref 20–32)
Calcium: 9.7 mg/dL (ref 8.6–10.3)
Creat: 1.91 mg/dL — ABNORMAL HIGH (ref 0.70–1.33)
GFR, EST AFRICAN AMERICAN: 44 mL/min/{1.73_m2} — AB (ref >=60)
GFR, Est Non African American: 38 mL/min/{1.73_m2} — ABNORMAL LOW (ref >=60)
Globulin: 3.5 g/dL (calc) (ref 1.9–3.7)
Glucose, Bld: 223 mg/dL — ABNORMAL HIGH (ref 65–99)
Potassium: 4.3 mmol/L (ref 3.5–5.3)
Sodium: 142 mmol/L (ref 135–146)
TOTAL PROTEIN: 7.9 g/dL (ref 6.1–8.1)
Total Bilirubin: 0.2 mg/dL (ref 0.2–1.2)

## 2017-07-22 LAB — CBC WITH DIFFERENTIAL/PLATELET
BASOS ABS: 30 {cells}/uL (ref 0–200)
BASOS PCT: 0.4 %
EOS ABS: 395 {cells}/uL (ref 15–500)
Eosinophils Relative: 5.2 %
HCT: 37.7 % — ABNORMAL LOW (ref 38.5–50.0)
Hemoglobin: 12.8 g/dL — ABNORMAL LOW (ref 13.2–17.1)
Lymphs Abs: 2835 cells/uL (ref 850–3900)
MCH: 29.7 pg (ref 27.0–33.0)
MCHC: 34 g/dL (ref 32.0–36.0)
MCV: 87.5 fL (ref 80.0–100.0)
MONOS PCT: 9.9 %
MPV: 12.8 fL — AB (ref 7.5–12.5)
Neutro Abs: 3587 cells/uL (ref 1500–7800)
Neutrophils Relative %: 47.2 %
PLATELETS: 134 10*3/uL — AB (ref 140–400)
RBC: 4.31 10*6/uL (ref 4.20–5.80)
RDW: 13.3 % (ref 11.0–15.0)
TOTAL LYMPHOCYTE: 37.3 %
WBC: 7.6 10*3/uL (ref 3.8–10.8)
WBCMIX: 752 {cells}/uL (ref 200–950)

## 2017-07-22 LAB — T-HELPER CELL (CD4) - (RCID CLINIC ONLY)
CD4 % Helper T Cell: 26 % — ABNORMAL LOW (ref 33–55)
CD4 T CELL ABS: 690 /uL (ref 400–2700)

## 2017-07-22 LAB — URINE CYTOLOGY ANCILLARY ONLY
CHLAMYDIA, DNA PROBE: NEGATIVE
Neisseria Gonorrhea: NEGATIVE

## 2017-07-22 LAB — RPR: RPR: NONREACTIVE

## 2017-07-23 LAB — HIV-1 RNA QUANT-NO REFLEX-BLD
HIV 1 RNA Quant: <20 copies/mL — AB
HIV-1 RNA QUANT, LOG: DETECTED {Log_copies}/mL — AB

## 2017-08-04 ENCOUNTER — Ambulatory Visit: Payer: Medicare Other | Admitting: Infectious Disease

## 2017-08-06 ENCOUNTER — Encounter: Payer: Self-pay | Admitting: Infectious Disease

## 2017-08-06 ENCOUNTER — Ambulatory Visit (INDEPENDENT_AMBULATORY_CARE_PROVIDER_SITE_OTHER): Payer: Medicare Other | Admitting: Infectious Disease

## 2017-08-06 VITALS — Ht 69.0 in | Wt 199.0 lb

## 2017-08-06 DIAGNOSIS — N183 Chronic kidney disease, stage 3 unspecified: Secondary | ICD-10-CM

## 2017-08-06 DIAGNOSIS — F259 Schizoaffective disorder, unspecified: Secondary | ICD-10-CM | POA: Diagnosis not present

## 2017-08-06 DIAGNOSIS — F209 Schizophrenia, unspecified: Secondary | ICD-10-CM

## 2017-08-06 DIAGNOSIS — R569 Unspecified convulsions: Secondary | ICD-10-CM | POA: Diagnosis not present

## 2017-08-06 DIAGNOSIS — K219 Gastro-esophageal reflux disease without esophagitis: Secondary | ICD-10-CM | POA: Diagnosis not present

## 2017-08-06 DIAGNOSIS — R739 Hyperglycemia, unspecified: Secondary | ICD-10-CM | POA: Insufficient documentation

## 2017-08-06 DIAGNOSIS — B20 Human immunodeficiency virus [HIV] disease: Secondary | ICD-10-CM

## 2017-08-06 DIAGNOSIS — F319 Bipolar disorder, unspecified: Secondary | ICD-10-CM | POA: Diagnosis not present

## 2017-08-06 HISTORY — DX: Schizoaffective disorder, unspecified: F25.9

## 2017-08-06 HISTORY — DX: Hyperglycemia, unspecified: R73.9

## 2017-08-06 MED ORDER — BICTEGRAVIR-EMTRICITAB-TENOFOV 50-200-25 MG PO TABS: 1.0000 | ORAL_TABLET | Freq: Every day | ORAL | 11 refills | Status: DC

## 2017-08-06 NOTE — Patient Instructions (Signed)
WE WILL HAVE YOU  START BIKTARVY ONE PILL ONCE A DAY  WHEN YOU START BIKTARVY  DISCONTINUE THE TIVICAY AND DESCOVY   COME BACK IN ONE MONTH FOR LABS AND THEN SEE ME IN 6 WEEKS

## 2017-08-06 NOTE — Progress Notes (Signed)
Subjective:   Chief complaint : followup for HIV, cognitive disability   Patient ID: Nathan [name], male    DOB: [birth year], 57 y.o.   MRN: [id number]  HPI  Mr. Kling is a 57 year old man with multiple medical problems including HIV AIDS, cognitive disability with aggressive behavior bipolar disorder who had been followed here for some time on [place] NORVIR and Epzicom then had not been seen since 2015 in the interim having been followed at [facility] as recently as  2017. He was changed to [place] has been nicely suppressed on this regimen. He was incarcerated at [facility] for some time and now has been released and wanted to reestablish care here in [facility] he has done  He has done very well on this regimen THOUGH HIS ABACAVIR was never stoppped at the SNF where he resides. He is now at [facility] and med list no longer lists ABC.   He comes to clinic with caregiver.   Lab Results  Component Value Date   HIV1RNAQUANT <20 DETECTED (A) 07/21/2017   HIV1RNAQUANT <20 07/21/2016   HIV1RNAQUANT <20 10/31/2014   Lab Results  Component Value Date   CD4TABS 690 07/21/2017   CD4TABS 610 07/21/2016   CD4TABS 840 03/13/2016     Past Medical History:  Diagnosis Date  . Anemia   . Anemia   . Anxiety   . Bipolar 1 disorder ([place])   . Depression, major   . HIV (human immunodeficiency virus infection) ([place])   . Hypernatremia   . Kidney failure   . Lithium toxicity   . MR (mental retardation)   . Schizophrenia [facility])     Past Surgical History:  Procedure Laterality Date  . BIOPSY N/A 04/12/2014   Procedure: BIOPSY;  Surgeon: Danie Binder, MD;  Location: [facility];  Service: Endoscopy;  Laterality: N/A;  Gastric  . COLONOSCOPY WITH PROPOFOL N/A 04/12/2014   SLF:  1. one colon polyp removed 2. The left colon is extremely redundant 3. Small internal hemorrhoids  . ESOPHAGOGASTRODUODENOSCOPY (EGD) WITH PROPOFOL N/A 04/12/2014   SLF: Stricture at the  gastroesophageal junction 2. small polyp  in teh gastric body 3. Mild Non-erosive gastritis.   Marland Kitchen NOSE SURGERY  1968   unsure what type of surgery. something to help him breathe better  . POLYPECTOMY N/A 04/12/2014   Procedure: POLYPECTOMY;  Surgeon: Danie Binder, MD;  Location: [facility];  Service: Endoscopy;  Laterality: N/A;  Cecal  . SAVORY DILATION N/A 04/12/2014   Procedure: SAVORY DILATION;  Surgeon: Danie Binder, MD;  Location: [facility];  Service: Endoscopy;  Laterality: N/A;  12.8/ 14/15/16  . SKIN BIOPSY      Family History  Problem Relation Age of Onset  . Colon cancer Neg Hx       Social History   Socioeconomic History  . Marital status: Single    Spouse name: Not on file  . Number of children: Not on file  . Years of education: Not on file  . Highest education level: Not on file  Occupational History  . Not on file  Social Needs  . Financial resource strain: Not on file  . Food insecurity:    Worry: Not on file    Inability: Not on file  . Transportation needs:    Medical: Not on file    Non-medical: Not on file  Tobacco Use  . Smoking status: Never Smoker  . Smokeless tobacco: Never Used  Substance and Sexual Activity  . Alcohol use: No    Alcohol/week: 0.0 oz  . Drug use: No  . Sexual activity: Never    Birth control/protection: Abstinence  Lifestyle  . Physical activity:    Days per week: Not on file    Minutes per session: Not on file  . Stress: Not on file  Relationships  . Social connections:    Talks on phone: Not on file    Gets together: Not on file    Attends religious service: Not on file    Active member of club or organization: Not on file    Attends meetings of clubs or organizations: Not on file    Relationship status: Not on file  Other Topics Concern  . Not on file  Social History Narrative  . Not on file    No Known Allergies   Current Outpatient Medications:  .  dolutegravir (TIVICAY) 50 MG tablet, Take 1 tablet (50 mg total)  by mouth daily., Disp: 30 tablet, Rfl: 11 .  emtricitabine-tenofovir AF (DESCOVY) 200-25 MG tablet, Take 1 tablet by mouth daily., Disp: 30 tablet, Rfl: 11 .  folic acid (FOLVITE) 1 MG tablet, Take 1 mg by mouth daily., Disp: , Rfl:  .  gabapentin (NEURONTIN) 300 MG capsule, Take 300 mg by mouth every morning. With 400mg  capsule, Disp: , Rfl:  .  haloperidol (HALDOL) 5 MG tablet, Take 2 tablets (10 mg total) by mouth daily with supper., Disp: , Rfl:  .  ranitidine (ZANTAC) 150 MG tablet, TAKE 1 TABLET BY MOUTH 12 HOURS AFTER TAKING REYATAZ., Disp: 30 tablet, Rfl: 3 .  senna (SENOKOT) 8.6 MG tablet, Take 2 tablets by mouth 2 (two) times daily., Disp: , Rfl:  .  azithromycin (ZITHROMAX) 250 MG tablet, Start with 2 tablets today, then 1 daily thereafter., Disp: 6 tablet, Rfl: 0 .  benztropine (COGENTIN) 0.5 MG tablet, Take 1 mg by mouth 2 (two) times daily. , Disp: , Rfl:  .  cetirizine (ZYRTEC ALLERGY) 10 MG tablet, Take 1 tablet (10 mg total) by mouth daily., Disp: 30 tablet, Rfl: 11 .  clonazePAM (KLONOPIN) 2 MG tablet, Take 0.5 tablets (1 mg total) by mouth at bedtime. (Patient not taking: Reported on 08/28/2016), Disp: , Rfl:  .  clozapine (FAZACLO) 100 MG disintegrating tablet, Take 100 mg by mouth 2 (two) times daily. , Disp: , Rfl:  .  divalproex (DEPAKOTE ER) 250 MG 24 hr tablet, Take 500-1,000 mg by mouth daily. 500mg  in the am 1000mg  in the pm, Disp: , Rfl:  .  gabapentin (NEURONTIN) 400 MG capsule, Take 400 mg by mouth 2 (two) times daily. Take 1 capsule in AM with gabapentin 300mg , take 1 capsule in PM, Disp: , Rfl:  .  ipratropium (ATROVENT) 0.03 % nasal spray, 1 spray 2 (two) times daily. UNDER THE TONGUE, Disp: , Rfl:  .  polyethylene glycol (MIRALAX / GLYCOLAX) packet, Take 17 g by mouth 2 (two) times daily as needed for mild constipation or moderate constipation. , Disp: , Rfl:  .  predniSONE (DELTASONE) 20 MG tablet, Take 2 tablets daily with breakfast., Disp: 10 tablet, Rfl: 0 .   sodium chloride (OCEAN) 0.65 % SOLN nasal spray, Place 1 spray into both nostrils as needed for congestion., Disp: , Rfl:    Review of Systems  Unable to perform ROS: Psychiatric disorder       Objective:   Physical Exam  Constitutional: He is oriented to person, place, and time.  He appears well-developed and well-nourished. No distress.  HENT:  Head: Normocephalic and atraumatic.  Mouth/Throat: No oropharyngeal exudate.    Eyes: Conjunctivae and EOM are normal. No scleral icterus.  Neck: Normal range of motion. Neck supple.  Cardiovascular: Normal rate and regular rhythm.  Pulmonary/Chest: Effort normal. No respiratory distress. He has no wheezes.  Abdominal: He exhibits no distension.  Musculoskeletal: He exhibits no edema or tenderness.  Neurological: He is alert and oriented to person, place, and time. He exhibits normal muscle tone. Coordination normal.  Skin: Skin is warm and dry. No rash noted. He is not diaphoretic. No erythema. No pallor.  Psychiatric: His speech is delayed and slurred. He is slowed. Cognition and memory are impaired.          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  Chronic kidney disease renal stable  Bipolar disorder continue VPA  AMS: likely due to excess sedation  GERD: on h2 blocker  Hyperglyemia: noted this on labs and told he was borderline DM. I will order another A1c for lab visit  I spent greater than 25 minutes with the patient including greater than 50% of time in face to face counsel of the patient and caregiver  Re his HIV his new regimen and in coordination of his care including time spent filling out paperwork for the patients plan certification.

## 2017-11-01 ENCOUNTER — Encounter (HOSPITAL_COMMUNITY): Payer: Self-pay

## 2017-11-01 ENCOUNTER — Ambulatory Visit (HOSPITAL_COMMUNITY)
Admission: EM | Admit: 2017-11-01 | Discharge: 2017-11-01 | Disposition: A | Discharge status: Home / Self Care | Payer: Medicare Other | Attending: Internal Medicine | Admitting: Internal Medicine

## 2017-11-01 ENCOUNTER — Ambulatory Visit (INDEPENDENT_AMBULATORY_CARE_PROVIDER_SITE_OTHER): Payer: Medicare Other

## 2017-11-01 DIAGNOSIS — J9811 Atelectasis: Secondary | ICD-10-CM

## 2017-11-01 DIAGNOSIS — H66001 Acute suppurative otitis media without spontaneous rupture of ear drum, right ear: Secondary | ICD-10-CM | POA: Diagnosis not present

## 2017-11-01 DIAGNOSIS — H1033 Unspecified acute conjunctivitis, bilateral: Secondary | ICD-10-CM

## 2017-11-01 DIAGNOSIS — R0602 Shortness of breath: Secondary | ICD-10-CM

## 2017-11-01 MED ORDER — POLYMYXIN B-TRIMETHOPRIM 10000-0.1 UNIT/ML-% OP SOLN: 1.0000 [drp] | OPHTHALMIC | 0 refills | Status: AC

## 2017-11-01 MED ORDER — AZITHROMYCIN 250 MG PO TABS: 250.0000 mg | ORAL_TABLET | Freq: Every day | ORAL | 0 refills | Status: DC

## 2017-11-01 MED ORDER — AMOXICILLIN-POT CLAVULANATE 875-125 MG PO TABS: 1.0000 | ORAL_TABLET | Freq: Two times a day (BID) | ORAL | 0 refills | Status: AC

## 2017-11-01 NOTE — Discharge Instructions (Addendum)
Chest x-ray did not show any pneumonia  He does appear to have a right ear infection-please begin taking Augmentin twice daily for the next 10 days Please use Polytrim eyedrops 1 to 2 drops in both eyes every 4-6 hours; warm compresses to help remove discharge  Please go to emergency room if patient not having any improvement in the next 48 hours, please go to emergency room if developing worsening shortness of breath, increased lethargy, fever, not improving.

## 2017-11-01 NOTE — ED Triage Notes (Signed)
Pt presents with complaints of shortness of breath, congestion and eye drainage. Family reports excessive sleeping x 1 week.

## 2017-11-01 NOTE — ED Provider Notes (Signed)
[facility]    CSN: [id number] Arrival date & time: 11/01/17  1728     History   Chief Complaint Chief Complaint  Patient presents with  . Shortness of Breath    HPI Nathan [name] is a 57 y.o. male history of HIV, mental retardation, CKD, among others listed below presenting today for evaluation of shortness of breath, congestion and eye drainage.  Patient lives in a group home, and group home manager is noticed that he has become increasingly sleepy over the past week.  He has had low energy and also notices increased shortness of breath especially with sleeping.  Over the past 3 days has he has had increasing eye drainage to both eyes.  She denies significant changes from his baseline besides being slightly more lethargic.  She notes that he typically drags his feet when he is walking and will likely have occasional slurring of speech.  She is unsure of any fevers.  HPI  Past Medical History:  Diagnosis Date  . Anemia   . Anemia   . Anxiety   . Bipolar 1 disorder ([place])   . Depression, major   . HIV (human immunodeficiency virus infection) ([place])   . Hyperglycemia 08/06/2017  . Hypernatremia   . Kidney failure   . Lithium toxicity   . MR (mental retardation)   . Schizoaffective disorder ([place]) 08/06/2017  . Schizophrenia [facility])     Patient Active Problem List   Diagnosis Date Noted  . Hyperglycemia 08/06/2017  . Schizoaffective disorder ([place]) 08/06/2017  . Bipolar 1 disorder ([place])   . Schizophrenia ([place])   . Seizures ([place])   . Altered mental state 07/20/2016  . Aggressive behavior   . GERD (gastroesophageal reflux disease) 07/25/2014  . Acute encephalopathy 06/23/2014  . Hypernatremia 06/20/2014  . Dehydration 06/18/2014  . Lithium toxicity 06/18/2014  . ARF (acute renal failure) ([place]) 06/18/2014  . Acute urinary retention 06/18/2014  . CAP (community acquired pneumonia) 06/18/2014  . Encounter for screening colonoscopy 03/25/2014  . Dysphagia,  pharyngoesophageal phase 03/25/2014  . CKD (chronic kidney disease) stage 3, GFR 30-59 ml/min (HCC) 07/22/2012  . AIDS (acquired immune deficiency syndrome) ([place]) 06/19/2012  . Bipolar 1 disorder, mixed, moderate ([place]) 06/19/2012  . Hyponatremia 06/19/2012  . Mental retardation 06/19/2012    Past Surgical History:  Procedure Laterality Date  . BIOPSY N/A 04/12/2014   Procedure: BIOPSY;  Surgeon: Danie Binder, MD;  Location: [facility];  Service: Endoscopy;  Laterality: N/A;  Gastric  . COLONOSCOPY WITH PROPOFOL N/A 04/12/2014   SLF:  1. one colon polyp removed 2. The left colon is extremely redundant 3. Small internal hemorrhoids  . ESOPHAGOGASTRODUODENOSCOPY (EGD) WITH PROPOFOL N/A 04/12/2014   SLF: Stricture at the gastroesophageal junction 2. small polyp  in teh gastric body 3. Mild Non-erosive gastritis.   Marland Kitchen NOSE SURGERY  1968   unsure what type of surgery. something to help him breathe better  . POLYPECTOMY N/A 04/12/2014   Procedure: POLYPECTOMY;  Surgeon: Danie Binder, MD;  Location: [facility];  Service: Endoscopy;  Laterality: N/A;  Cecal  . SAVORY DILATION N/A 04/12/2014   Procedure: SAVORY DILATION;  Surgeon: Danie Binder, MD;  Location: [facility];  Service: Endoscopy;  Laterality: N/A;  12.8/ 14/15/16  . SKIN BIOPSY         Home Medications    Prior to Admission medications   Medication Sig Start Date End Date Taking? Authorizing Provider  benztropine (COGENTIN) 0.5 MG tablet Take  1 mg by mouth 2 (two) times daily.    Yes [provider]  bictegravir-emtricitabine-tenofovir AF (BIKTARVY) 50-200-25 MG TABS tablet Take 1 tablet by mouth daily. 08/06/17  Yes Tommy Medal, Lavell Islam, MD  clozapine (FAZACLO) 100 MG disintegrating tablet Take 100 mg by mouth 2 (two) times daily.    Yes [provider]  divalproex (DEPAKOTE ER) 250 MG 24 hr tablet Take 500-1,000 mg by mouth daily. 500mg  in the am 1000mg  in the pm   Yes [provider]  folic acid (FOLVITE) 1 MG tablet  Take 1 mg by mouth daily.   Yes [provider]  gabapentin (NEURONTIN) 300 MG capsule Take 300 mg by mouth every morning. With 400mg  capsule   Yes [provider]  gabapentin (NEURONTIN) 400 MG capsule Take 400 mg by mouth 2 (two) times daily. Take 1 capsule in AM with gabapentin 300mg , take 1 capsule in PM   Yes [provider]  haloperidol (HALDOL) 5 MG tablet Take 2 tablets (10 mg total) by mouth daily with supper. 07/21/16  Yes Rogue Bussing, MD  ipratropium (ATROVENT) 0.03 % nasal spray 1 spray 2 (two) times daily. UNDER THE TONGUE   Yes [provider]  ranitidine (ZANTAC) 150 MG tablet TAKE 1 TABLET BY MOUTH 12 HOURS AFTER TAKING REYATAZ. 10/31/12  Yes Tommy Medal, Lavell Islam, MD  amoxicillin-clavulanate (AUGMENTIN) 875-125 MG tablet Take 1 tablet by mouth every 12 (twelve) hours for 10 days. 11/01/17 11/11/17  Jabriel Vanduyne C, PA-C  cetirizine (ZYRTEC ALLERGY) 10 MG tablet Take 1 tablet (10 mg total) by mouth daily. 12/28/16   Jaynee Eagles, PA-C  clonazePAM (KLONOPIN) 2 MG tablet Take 0.5 tablets (1 mg total) by mouth at bedtime. Patient not taking: Reported on 08/28/2016 07/21/16   Rogue Bussing, MD  polyethylene glycol [facility] / Floria Raveling) packet Take 17 g by mouth 2 (two) times daily as needed for mild constipation or moderate constipation.     [provider]  predniSONE (DELTASONE) 20 MG tablet Take 2 tablets daily with breakfast. 12/28/16   Jaynee Eagles, PA-C  senna (SENOKOT) 8.6 MG tablet Take 2 tablets by mouth 2 (two) times daily.    [provider]  sodium chloride (OCEAN) 0.65 % SOLN nasal spray Place 1 spray into both nostrils as needed for congestion.    [provider]  trimethoprim-polymyxin b (POLYTRIM) ophthalmic solution Place 1 drop into both eyes every 4 (four) hours for 7 days. 11/01/17 11/08/17  Garvin Ellena, Elesa Hacker, PA-C    Family History Family History  Problem Relation Age of Onset  . Colon cancer  Neg Hx     Social History Social History   Tobacco Use  . Smoking status: Never Smoker  . Smokeless tobacco: Never Used  Substance Use Topics  . Alcohol use: No    Alcohol/week: 0.0 oz  . Drug use: No     Allergies   Patient has no known allergies.   Review of Systems Review of Systems  Constitutional: Negative for activity change, appetite change, chills, fatigue and fever.  HENT: Positive for congestion and rhinorrhea. Negative for ear pain, sinus pressure, sore throat and trouble swallowing.   Eyes: Positive for discharge and redness. Negative for photophobia, pain and visual disturbance.  Respiratory: Positive for cough and shortness of breath. Negative for chest tightness.   Cardiovascular: Negative for chest pain.  Gastrointestinal: Negative for abdominal pain, diarrhea, nausea and vomiting.  Musculoskeletal: Negative for myalgias.  Skin: Negative for  rash.  Neurological: Negative for dizziness, light-headedness and headaches.     Physical Exam Triage Vital Signs ED Triage Vitals  Enc Vitals Group     BP 11/01/17 1818 138/85     Pulse Rate 11/01/17 1818 93     Resp 11/01/17 1818 20     Temp 11/01/17 1818 98 F (36.7 C)     Temp Source 11/01/17 1818 Oral     SpO2 11/01/17 1818 97 %     Weight --      Height --      Head Circumference --      Peak Flow --      Pain Score 11/01/17 1828 0     Pain Loc --      Pain Edu? --      Excl. in [place]? --    No data found.  Updated Vital Signs BP 138/85 (BP Location: Right Arm)   Pulse 93   Temp 98 F (36.7 C) (Oral)   Resp 20   SpO2 97%   Visual Acuity Right Eye Distance: 20/100 Left Eye Distance: 20/100 Bilateral Distance: 20/100  Right Eye Near:   Left Eye Near:    Bilateral Near:     Physical Exam  Constitutional: He appears well-developed and well-nourished.  Appears resting when entering room, easily arousable  HENT:  Head: Normocephalic and atraumatic.  Bilateral ears without tenderness to  palpation of external auricle, tragus and mastoid, EAC's without erythema or swelling, TM's with good bony landmarks and cone of light.  Right TM erythematous and dull  Oral mucosa pink and appear dry, no tonsillar enlargement or exudate. Posterior pharynx patent and nonerythematous, no uvula deviation or swelling.  Slightly slurred speech    Eyes: Pupils are equal, round, and reactive to light. Conjunctivae and EOM are normal.  Bilateral eyes with moderate conjunctival erythema, significant amount of yellowish-green discharge present on bilateral upper and lower lashes and in medial canthus  Neck: Neck supple.  Cardiovascular: Normal rate and regular rhythm.  No murmur heard. Pulmonary/Chest: Effort normal and breath sounds normal. No respiratory distress.  Breathing comfortably at rest, CTABL, no wheezing, rales or other adventitious sounds auscultated  Abdominal: Soft. There is no tenderness.  Musculoskeletal: He exhibits no edema.  Neurological: He is alert.  Skin: Skin is warm and dry.  Psychiatric: He has a normal mood and affect.  Nursing note and vitals reviewed.    UC Treatments / Results  Labs (all labs ordered are listed, but only abnormal results are displayed) Labs Reviewed - No data to display  EKG None  Radiology Dg Chest 2 View  Result Date: 11/01/2017 CLINICAL DATA:  Shortness of breath.  History of HIV. EXAM: CHEST - 2 VIEW COMPARISON:  July 21, 2016 FINDINGS: The heart, hila, mediastinum are normal. No pulmonary nodules or masses. Mild bibasilar atelectasis. No other acute abnormalities identified. IMPRESSION: Mild bibasilar atelectasis. Electronically Signed   By: Dorise Bullion III M.D   On: 11/01/2017 19:09    Procedures Procedures (including critical care time)  Medications Ordered in UC Medications - No data to display  Initial Impression / Assessment and Plan / UC Course  I have reviewed the triage vital signs and the nursing notes.  Pertinent  labs & imaging results that were available during my care of the patient were reviewed by me and considered in my medical decision making (see chart for details).     Chest x-ray negative for pneumonia.  Will treat patient for  right otitis media with Augmentin as well as provide Polytrim eyedrops to treat bacterial conjunctivitis.  Discussed with caregiver to follow-up with PCP in 2 days or return here/ED if symptoms not improving and energy level not improving.Discussed strict return precautions. Patient verbalized understanding and is agreeable with plan.  Final Clinical Impressions(s) / UC Diagnoses   Final diagnoses:  Non-recurrent acute suppurative otitis media of right ear without spontaneous rupture of tympanic membrane  Shortness of breath  Acute bacterial conjunctivitis of both eyes     Discharge Instructions     Chest x-ray did not show any pneumonia  He does appear to have a right ear infection-please begin taking Augmentin twice daily for the next 10 days Please use Polytrim eyedrops 1 to 2 drops in both eyes every 4-6 hours; warm compresses to help remove discharge  Please go to emergency room if patient not having any improvement in the next 48 hours, please go to emergency room if developing worsening shortness of breath, increased lethargy, fever, not improving.   ED Prescriptions    Medication Sig Dispense Auth. Provider   azithromycin (ZITHROMAX) 250 MG tablet  (Status: Discontinued) Take 1 tablet (250 mg total) by mouth daily. Take first 2 tablets together, then 1 every day until finished. 6 tablet Jerami Tammen C, PA-C   trimethoprim-polymyxin b (POLYTRIM) ophthalmic solution Place 1 drop into both eyes every 4 (four) hours for 7 days. 10 mL Waylin Dorko C, PA-C   amoxicillin-clavulanate (AUGMENTIN) 875-125 MG tablet Take 1 tablet by mouth every 12 (twelve) hours for 10 days. 20 tablet Nikkita Adeyemi, [place] C, PA-C     Controlled Substance Prescriptions [place]  Controlled Substance Registry consulted? Not Applicable   Janith Lima, [place] 11/01/17 2000

## 2017-11-26 ENCOUNTER — Other Ambulatory Visit: Payer: Self-pay

## 2017-11-26 ENCOUNTER — Emergency Department (HOSPITAL_COMMUNITY): Payer: Medicare Other

## 2017-11-26 ENCOUNTER — Encounter (HOSPITAL_COMMUNITY): Payer: Self-pay | Admitting: Emergency Medicine

## 2017-11-26 ENCOUNTER — Emergency Department (HOSPITAL_COMMUNITY)
Admission: EM | Admit: 2017-11-26 | Discharge: 2017-11-27 | Disposition: A | Discharge status: Home / Self Care | Payer: Medicare Other | Attending: Emergency Medicine | Admitting: Emergency Medicine

## 2017-11-26 DIAGNOSIS — N189 Chronic kidney disease, unspecified: Secondary | ICD-10-CM | POA: Diagnosis not present

## 2017-11-26 DIAGNOSIS — F70 Mild intellectual disabilities: Secondary | ICD-10-CM | POA: Diagnosis not present

## 2017-11-26 DIAGNOSIS — G40909 Epilepsy, unspecified, not intractable, without status epilepticus: Secondary | ICD-10-CM

## 2017-11-26 DIAGNOSIS — Z79899 Other long term (current) drug therapy: Secondary | ICD-10-CM | POA: Insufficient documentation

## 2017-11-26 DIAGNOSIS — N289 Disorder of kidney and ureter, unspecified: Secondary | ICD-10-CM

## 2017-11-26 LAB — CBC WITH DIFFERENTIAL/PLATELET
ABS IMMATURE GRANULOCYTES: 0 10*3/uL (ref 0.0–0.1)
BASOS PCT: 1 %
Basophils Absolute: 0 10*3/uL (ref 0.0–0.1)
EOS ABS: 0.5 10*3/uL (ref 0.0–0.7)
Eosinophils Relative: 5 %
HCT: 36.8 % — ABNORMAL LOW (ref 39.0–52.0)
Hemoglobin: 11.3 g/dL — ABNORMAL LOW (ref 13.0–17.0)
IMMATURE GRANULOCYTES: 0 %
LYMPHS ABS: 2.3 10*3/uL (ref 0.7–4.0)
Lymphocytes Relative: 28 %
MCH: 29.3 pg (ref 26.0–34.0)
MCHC: 30.7 g/dL (ref 30.0–36.0)
MCV: 95.3 fL (ref 78.0–100.0)
MONO ABS: 0.7 10*3/uL (ref 0.1–1.0)
MONOS PCT: 8 %
NEUTROS PCT: 58 %
Neutro Abs: 4.8 10*3/uL (ref 1.7–7.7)
Platelets: 133 10*3/uL — ABNORMAL LOW (ref 150–400)
RBC: 3.86 MIL/uL — ABNORMAL LOW (ref 4.22–5.81)
RDW: 13.9 % (ref 11.5–15.5)
WBC: 8.3 10*3/uL (ref 4.0–10.5)

## 2017-11-26 LAB — I-STAT CHEM 8, ED
BUN: 15 mg/dL (ref 6–20)
CHLORIDE: 104 mmol/L (ref 98–111)
Calcium, Ion: 1.14 mmol/L — ABNORMAL LOW (ref 1.15–1.40)
Creatinine, Ser: 2.1 mg/dL — ABNORMAL HIGH (ref 0.61–1.24)
GLUCOSE: 158 mg/dL — AB (ref 70–99)
HCT: 36 % — ABNORMAL LOW (ref 39.0–52.0)
Hemoglobin: 12.2 g/dL — ABNORMAL LOW (ref 13.0–17.0)
POTASSIUM: 3.9 mmol/L (ref 3.5–5.1)
Sodium: 140 mmol/L (ref 135–145)
TCO2: 21 mmol/L — ABNORMAL LOW (ref 22–32)

## 2017-11-26 MED ORDER — LEVETIRACETAM IN NACL 1000 MG/100ML IV SOLN
1000.0000 mg | Freq: Once | INTRAVENOUS | Status: AC
  Administered 2017-11-26: 1000 mg via INTRAVENOUS
  Filled 2017-11-26: qty 100

## 2017-11-26 NOTE — ED Notes (Signed)
Patient transported to CT 

## 2017-11-26 NOTE — ED Triage Notes (Signed)
Pt arrives to ED from a group home with complaints of a seizure witnessed by group home mother. EMS reports when fire arrived, pt was still seizing, when EMS arrived pt was not seizing, but was combative. 5mg  Versed given IM. Pt currently post ictal.Pt was sitting on couch at time of seizure and slide down to floor. Pt did not hit head per group home mother. Pt placed in position of comfort with bed locked and lowered, call bell in reach.

## 2017-11-26 NOTE — ED Provider Notes (Signed)
[facility] EMERGENCY DEPARTMENT Provider Note   CSN: [id number] Arrival date & time: 11/26/17  2307     History   Chief Complaint No chief complaint on file.   HPI Nathan [name] is a 57 y.o. male.  The history is provided by the EMS personnel.  Seizures   This is a recurrent problem. The current episode started less than 1 hour ago. The problem has been resolved. There was 1 seizure. Associated symptoms include sleepiness. Pertinent negatives include no chest pain. Characteristics include rhythmic jerking. The episode was witnessed. There was no sensation of an aura present. The seizures did not continue in the ED. The seizure(s) had no focality. Possible causes do not include medication or dosage change. There has been no fever. Meds prior to arrival: versed.  Patient with known seizures had one witnessed seizure at group home tonight.  No trauma.  Given 5 mg of versed en route.    Past Medical History:  Diagnosis Date  . Anemia   . Anemia   . Anxiety   . Bipolar 1 disorder ([place])   . Depression, major   . HIV (human immunodeficiency virus infection) ([place])   . Hyperglycemia 08/06/2017  . Hypernatremia   . Kidney failure   . Lithium toxicity   . MR (mental retardation)   . Schizoaffective disorder ([place]) 08/06/2017  . Schizophrenia [facility])     Patient Active Problem List   Diagnosis Date Noted  . Hyperglycemia 08/06/2017  . Schizoaffective disorder ([place]) 08/06/2017  . Bipolar 1 disorder ([place])   . Schizophrenia ([place])   . Seizures ([place])   . Altered mental state 07/20/2016  . Aggressive behavior   . GERD (gastroesophageal reflux disease) 07/25/2014  . Acute encephalopathy 06/23/2014  . Hypernatremia 06/20/2014  . Dehydration 06/18/2014  . Lithium toxicity 06/18/2014  . ARF (acute renal failure) ([place]) 06/18/2014  . Acute urinary retention 06/18/2014  . CAP (community acquired pneumonia) 06/18/2014  . Encounter for screening colonoscopy 03/25/2014  .  Dysphagia, pharyngoesophageal phase 03/25/2014  . CKD (chronic kidney disease) stage 3, GFR 30-59 ml/min (HCC) 07/22/2012  . AIDS (acquired immune deficiency syndrome) ([place]) 06/19/2012  . Bipolar 1 disorder, mixed, moderate ([place]) 06/19/2012  . Hyponatremia 06/19/2012  . Mental retardation 06/19/2012    Past Surgical History:  Procedure Laterality Date  . BIOPSY N/A 04/12/2014   Procedure: BIOPSY;  Surgeon: Danie Binder, MD;  Location: [facility] ORS;  Service: Endoscopy;  Laterality: N/A;  Gastric  . COLONOSCOPY WITH PROPOFOL N/A 04/12/2014   SLF:  1. one colon polyp removed 2. The left colon is extremely redundant 3. Small internal hemorrhoids  . ESOPHAGOGASTRODUODENOSCOPY (EGD) WITH PROPOFOL N/A 04/12/2014   SLF: Stricture at the gastroesophageal junction 2. small polyp  in teh gastric body 3. Mild Non-erosive gastritis.   Marland Kitchen NOSE SURGERY  1968   unsure what type of surgery. something to help him breathe better  . POLYPECTOMY N/A 04/12/2014   Procedure: POLYPECTOMY;  Surgeon: Danie Binder, MD;  Location: [facility] ORS;  Service: Endoscopy;  Laterality: N/A;  Cecal  . SAVORY DILATION N/A 04/12/2014   Procedure: SAVORY DILATION;  Surgeon: Danie Binder, MD;  Location: [facility] ORS;  Service: Endoscopy;  Laterality: N/A;  12.8/ 14/15/16  . SKIN BIOPSY          Home Medications    Prior to Admission medications   Medication Sig Start Date End Date Taking? Authorizing Provider  benztropine (COGENTIN) 0.5 MG tablet Take 1 mg  by mouth 2 (two) times daily.     [provider]  bictegravir-emtricitabine-tenofovir AF (BIKTARVY) 50-200-25 MG TABS tablet Take 1 tablet by mouth daily. 08/06/17   Truman Hayward, MD  cetirizine (ZYRTEC ALLERGY) 10 MG tablet Take 1 tablet (10 mg total) by mouth daily. 12/28/16   Jaynee Eagles, PA-C  clonazePAM (KLONOPIN) 2 MG tablet Take 0.5 tablets (1 mg total) by mouth at bedtime. Patient not taking: Reported on 08/28/2016 07/21/16   Rogue Bussing, MD  clozapine  [facility]) 100 MG disintegrating tablet Take 100 mg by mouth 2 (two) times daily.     [provider]  divalproex (DEPAKOTE ER) 250 MG 24 hr tablet Take 500-1,000 mg by mouth daily. 500mg  in the am 1000mg  in the pm    [provider]  folic acid (FOLVITE) 1 MG tablet Take 1 mg by mouth daily.    [provider]  gabapentin (NEURONTIN) 300 MG capsule Take 300 mg by mouth every morning. With 400mg  capsule    [provider]  gabapentin (NEURONTIN) 400 MG capsule Take 400 mg by mouth 2 (two) times daily. Take 1 capsule in AM with gabapentin 300mg , take 1 capsule in PM    [provider]  haloperidol (HALDOL) 5 MG tablet Take 2 tablets (10 mg total) by mouth daily with supper. 07/21/16   Rogue Bussing, MD  ipratropium (ATROVENT) 0.03 % nasal spray 1 spray 2 (two) times daily. UNDER THE TONGUE    [provider]  polyethylene glycol (MIRALAX / GLYCOLAX) packet Take 17 g by mouth 2 (two) times daily as needed for mild constipation or moderate constipation.     [provider]  predniSONE (DELTASONE) 20 MG tablet Take 2 tablets daily with breakfast. 12/28/16   Jaynee Eagles, PA-C  ranitidine (ZANTAC) 150 MG tablet TAKE 1 TABLET BY MOUTH 12 HOURS AFTER TAKING REYATAZ. 10/31/12   Tommy Medal, Lavell Islam, MD  senna (SENOKOT) 8.6 MG tablet Take 2 tablets by mouth 2 (two) times daily.    [provider]  sodium chloride (OCEAN) 0.65 % SOLN nasal spray Place 1 spray into both nostrils as needed for congestion.    [provider]    Family History Family History  Problem Relation Age of Onset  . Colon cancer Neg Hx     Social History Social History   Tobacco Use  . Smoking status: Never Smoker  . Smokeless tobacco: Never Used  Substance Use Topics  . Alcohol use: No    Alcohol/week: 0.0 standard drinks  . Drug use: No     Allergies   Patient has no known allergies.   Review of Systems Review of Systems    Unable to perform ROS: Other  Constitutional: Negative for fever.  Respiratory: Negative for shortness of breath.   Cardiovascular: Negative for chest pain.  Neurological: Positive for seizures.     Physical Exam Updated Vital Signs There were no vitals taken for this visit.  Physical Exam  Constitutional: He is oriented to person, place, and time. He appears well-developed and well-nourished. No distress.  HENT:  Head: Atraumatic.  Nose: Nose normal.  Mouth/Throat: Oropharynx is clear and moist.  Eyes: Pupils are equal, round, and reactive to light. Conjunctivae are normal.  Neck: Normal range of motion. Neck supple. No tracheal deviation present.  Cardiovascular: Normal rate, regular rhythm, normal heart sounds and intact distal pulses.  Pulmonary/Chest: Effort normal and breath sounds normal. No stridor. He has no wheezes.  He has no rales.  Abdominal: Soft. Bowel sounds are normal. He exhibits no mass. There is no tenderness. There is no rebound and no guarding.  Musculoskeletal: Normal range of motion. He exhibits no deformity.  Neurological: He is alert and oriented to person, place, and time. He displays normal reflexes.  Skin: Skin is warm and dry. Capillary refill takes less than 2 seconds.  Psychiatric:  unable     ED Treatments / Results  Labs (all labs ordered are listed, but only abnormal results are displayed) Results for orders placed or performed during the hospital encounter of 11/26/17  CBC with Differential/Platelet  Result Value Ref Range   WBC 8.3 4.0 - 10.5 K/uL   RBC 3.86 (L) 4.22 - 5.81 MIL/uL   Hemoglobin 11.3 (L) 13.0 - 17.0 g/dL   HCT 36.8 (L) 39.0 - 52.0 %   MCV 95.3 78.0 - 100.0 fL   MCH 29.3 26.0 - 34.0 pg   MCHC 30.7 30.0 - 36.0 g/dL   RDW 13.9 11.5 - 15.5 %   Platelets 133 (L) 150 - 400 K/uL   Neutrophils Relative % 58 %   Neutro Abs 4.8 1.7 - 7.7 K/uL   Lymphocytes Relative 28 %   Lymphs Abs 2.3 0.7 - 4.0 K/uL   Monocytes Relative 8  %   Monocytes Absolute 0.7 0.1 - 1.0 K/uL   Eosinophils Relative 5 %   Eosinophils Absolute 0.5 0.0 - 0.7 K/uL   Basophils Relative 1 %   Basophils Absolute 0.0 0.0 - 0.1 K/uL   Immature Granulocytes 0 %   Abs Immature Granulocytes 0.0 0.0 - 0.1 K/uL  I-Stat Chem 8, ED  Result Value Ref Range   Sodium 140 135 - 145 mmol/L   Potassium 3.9 3.5 - 5.1 mmol/L   Chloride 104 98 - 111 mmol/L   BUN 15 6 - 20 mg/dL   Creatinine, Ser 2.10 (H) 0.61 - 1.24 mg/dL   Glucose, Bld 158 (H) 70 - 99 mg/dL   Calcium, Ion 1.14 (L) 1.15 - 1.40 mmol/L   TCO2 21 (L) 22 - 32 mmol/L   Hemoglobin 12.2 (L) 13.0 - 17.0 g/dL   HCT 36.0 (L) 39.0 - 52.0 %   Dg Chest 2 View  Result Date: 11/01/2017 CLINICAL DATA:  Shortness of breath.  History of HIV. EXAM: CHEST - 2 VIEW COMPARISON:  Keona Sheffler 15, 2018 FINDINGS: The heart, hila, mediastinum are normal. No pulmonary nodules or masses. Mild bibasilar atelectasis. No other acute abnormalities identified. IMPRESSION: Mild bibasilar atelectasis. Electronically Signed   By: Dorise Bullion III M.D   On: 11/01/2017 19:09   Ct Head Wo Contrast  Result Date: 11/26/2017 CLINICAL DATA:  Witnessed seizure at group home. EXAM: CT HEAD WITHOUT CONTRAST TECHNIQUE: Contiguous axial images were obtained from the base of the skull through the vertex without intravenous contrast. COMPARISON:  07/20/2016 FINDINGS: Limited due to motion artifacts. Brain: No intra-axial mass nor extra-axial fluid collections given limitations due to patient motion. No hydrocephalus. Midline fourth ventricle and basal cisterns without effacement. Vascular: No hyperdense vessel sign. Skull: No apparent skull fracture though there is misregistration artifacts from patient motion. Sinuses/Orbits: Nonacute Other: None IMPRESSION: Limited by motion artifacts. No acute intracranial abnormality is identified. Electronically Signed   By: Ashley Royalty M.D.   On: 11/26/2017 23:50    EKG  EKG  Interpretation  Date/Time:  Wednesday November 26 2017 23:15:18 EDT Ventricular Rate:  96 PR Interval:    QRS Duration: 100 QT  Interval:  343 QTC Calculation: 434 R Axis:   54 Text Interpretation:  Sinus rhythm Confirmed by Randal Buba, Desiray Orchard ([id number]) on 11/27/2017 12:06:59 AM       Radiology No results found.  Procedures Procedures (including critical care time)  Medications Ordered in ED Medications  sodium chloride 0.9 % bolus 1,000 mL (has no administration in time range)  divalproex (DEPAKOTE) DR tablet 500 mg (has no administration in time range)  levETIRAcetam (KEPPRA) IVPB 1000 mg/100 mL premix (0 mg Intravenous Stopped 11/26/17 2356)    Final Clinical Impressions(s) / ED Diagnoses  Reloaded with depakote.    Return for fevers >100.4 unrelieved by medication, shortness of breath, intractable vomiting, or diarrhea,  Inability to tolerate liquids or food, cough, altered mental status or any concerns. No signs of systemic illness or infection. The patient is nontoxic-appearing on exam and vital signs are within normal limits.   I have reviewed the triage vital signs and the nursing notes. Pertinent labs &imaging results that were available during my care of the patient were reviewed by me and considered in my medical decision making (see chart for details).  After history, exam, and medical workup I feel the patient has been appropriately medically screened and is safe for discharge home. Pertinent diagnoses were discussed with the patient. Patient was given return precautions.    Hanya Guerin, MD 11/27/17 [phone]

## 2017-11-27 ENCOUNTER — Encounter (HOSPITAL_COMMUNITY): Payer: Self-pay | Admitting: Emergency Medicine

## 2017-11-27 DIAGNOSIS — G40909 Epilepsy, unspecified, not intractable, without status epilepticus: Secondary | ICD-10-CM | POA: Diagnosis not present

## 2017-11-27 LAB — URINALYSIS, ROUTINE W REFLEX MICROSCOPIC
Bacteria, UA: NONE SEEN
Bilirubin Urine: NEGATIVE
Glucose, UA: NEGATIVE mg/dL
KETONES UR: NEGATIVE mg/dL
Leukocytes, UA: NEGATIVE
NITRITE: NEGATIVE
PROTEIN: NEGATIVE mg/dL
Specific Gravity, Urine: 1.006 (ref 1.005–1.030)
pH: 7 (ref 5.0–8.0)

## 2017-11-27 LAB — VALPROIC ACID LEVEL: Valproic Acid Lvl: <10 ug/mL — ABNORMAL LOW (ref 50.0–100.0)

## 2017-11-27 LAB — RAPID URINE DRUG SCREEN, HOSP PERFORMED
Amphetamines: NOT DETECTED
BARBITURATES: NOT DETECTED
BENZODIAZEPINES: POSITIVE — AB
COCAINE: NOT DETECTED
OPIATES: NOT DETECTED
Tetrahydrocannabinol: NOT DETECTED

## 2017-11-27 MED ORDER — LEVETIRACETAM 500 MG PO TABS: 500.0000 mg | ORAL_TABLET | Freq: Two times a day (BID) | ORAL | 0 refills | Status: DC

## 2017-11-27 MED ORDER — SODIUM CHLORIDE 0.9 % IV BOLUS
1000.0000 mL | Freq: Once | INTRAVENOUS | Status: AC
  Administered 2017-11-27: 1000 mL via INTRAVENOUS

## 2017-11-27 MED ORDER — DIVALPROEX SODIUM 250 MG PO DR TAB
500.0000 mg | DELAYED_RELEASE_TABLET | Freq: Two times a day (BID) | ORAL | Status: DC
  Administered 2017-11-27: 500 mg via ORAL
  Filled 2017-11-27: qty 2

## 2017-11-27 NOTE — ED Notes (Signed)
Patient and group home mother verbalizes understanding of discharge instructions. Opportunity for questioning and answers were provided. Armband removed by staff, pt discharged from ED in wheelchair.

## 2018-01-13 ENCOUNTER — Telehealth: Payer: Self-pay

## 2018-01-13 NOTE — Telephone Encounter (Signed)
Patient's guardian came into office today to schedule an appointment with our office. Patient had a seizure in September. Guardian states last time this occured Dr. Tommy Medal switched patient's regimen. Will schedule an appointment with Terri Piedra, Np. Will inform Dr. Tommy Medal as well.  Guardian: Ms. Nehemiah Settle [phone]

## 2018-01-15 ENCOUNTER — Ambulatory Visit (INDEPENDENT_AMBULATORY_CARE_PROVIDER_SITE_OTHER): Payer: Medicare Other | Admitting: Family

## 2018-01-15 ENCOUNTER — Encounter: Payer: Self-pay | Admitting: Family

## 2018-01-15 VITALS — BP 131/85 | HR 90 | Temp 98.0°F | Ht 69.0 in | Wt 196.0 lb

## 2018-01-15 DIAGNOSIS — F3162 Bipolar disorder, current episode mixed, moderate: Secondary | ICD-10-CM | POA: Diagnosis not present

## 2018-01-15 DIAGNOSIS — B20 Human immunodeficiency virus [HIV] disease: Secondary | ICD-10-CM | POA: Diagnosis present

## 2018-01-15 DIAGNOSIS — N183 Chronic kidney disease, stage 3 unspecified: Secondary | ICD-10-CM

## 2018-01-15 DIAGNOSIS — Z23 Encounter for immunization: Secondary | ICD-10-CM

## 2018-01-15 DIAGNOSIS — R569 Unspecified convulsions: Secondary | ICD-10-CM

## 2018-01-15 NOTE — Assessment & Plan Note (Signed)
Mr. Dresch appears to have well controlled HIV disease with his ART of Biktarvy with no adverse side effects or missed doses. He has no problems obtaining his medication. No signs/symptoms of opportunistic infection. Prevnar updated today. Caregiver to check on influenza vaccination. Check CD4 and viral load today. Continue current dose of Biktarvy. Follow up office visit in 3 months or sooner if needed with lab work 1-2 weeks prior to appointment.

## 2018-01-15 NOTE — Patient Instructions (Signed)
Nice to see you.  We will check your blood work today.  It does not appear as though there are any concerning interactions that would result in seizures.   Continue to take your Peter as prescribed.  Plan for follow up in 3 months or sooner if needed with lab work 1-2 weeks prior to your appointment.

## 2018-01-15 NOTE — Assessment & Plan Note (Signed)
New seizure activity on 8/21 with description consistent with complex tonic-clonic lasting about 10-15 minutes. No further seizures since. Remains on Depakote and Keppra. Does not appear to be related to HIV medications. There are no interactions currently. Needs to avoid Dilatin for interaction with Biktarvy. He has neurologist appointment scheduled for 1 month.

## 2018-01-15 NOTE — Progress Notes (Signed)
Subjective:    Patient ID: Nathan [name], male    DOB: 03-27-1961, 57 y.o.   MRN: [id number]  Chief Complaint  Patient presents with  . HIV Positive/AIDS     HPI:  Nathan [name] is a 57 y.o. male who presents today for follow up of his HIV disease. Nathan [name] is present with a care-giver today who provides most of the history.  Nathan [name] was last seen in the office on 08/06/2017 for routine follow-up with his medication regimen consisting of Tivicay and Descovy.  He was living at a group home at the time.  His regimen was changed to Biktarvy to simplify it.  He was advised to follow-up in 6 weeks or sooner but unfortunately was unable to do so.  He has been seen in the emergency room on 2 separate occasions one for otitis media and a second for a seizure.  Nathan [name]'s caregiver provides additional history on the seizure describing mild tonic-clonic movement and lasting for about 10 to 15 minutes with a slow to arouse postictal state.  He has not had seizures since leaving the ED.  However, since the seizure, he has had some slurred speech, shuffling of his feet, and somnolence/lethargy.  He has a neurologist appointment scheduled in a month.  Caregiver is concerned as these were symptoms he had previously which they believe is related to his HIV medications.  Nathan [name] has been taking his Biktarvy as prescribed with no adverse side effects or missed doses.  He has no problems obtaining his medication remains covered through both Medicare and Medicaid.  He still resides in [facility].    No Known Allergies    Outpatient Medications Prior to Visit  Medication Sig Dispense Refill  . aspirin 81 MG chewable tablet Chew 81 mg by mouth daily.    Marland Kitchen atorvastatin (LIPITOR) 20 MG tablet Take 20 mg by mouth daily.    . benztropine (COGENTIN) 2 MG tablet Take 2 mg by mouth 2 (two) times daily.    . bictegravir-emtricitabine-tenofovir AF (BIKTARVY) 50-200-25 MG TABS tablet  Take 1 tablet by mouth daily. 30 tablet 11  . cetirizine (ZYRTEC ALLERGY) 10 MG tablet Take 1 tablet (10 mg total) by mouth daily. 30 tablet 11  . clonazePAM (KLONOPIN) 2 MG tablet Take 0.5 tablets (1 mg total) by mouth at bedtime.    . clozapine (FAZACLO) 100 MG disintegrating tablet Take 50-100 mg by mouth See admin instructions. Take 1 tablet in the morning and take 1/2 tablet every evening    . folic acid (FOLVITE) 1 MG tablet Take 1 mg by mouth daily.    Marland Kitchen gabapentin (NEURONTIN) 300 MG capsule Take 300 mg by mouth daily. With '400mg'$  capsule     . gabapentin (NEURONTIN) 400 MG capsule Take 400 mg by mouth 2 (two) times daily.     . haloperidol (HALDOL) 5 MG tablet Take 2 tablets (10 mg total) by mouth daily with supper. (Patient taking differently: Take 5 mg by mouth daily with supper. )    . levETIRAcetam (KEPPRA) 500 MG tablet Take 1 tablet (500 mg total) by mouth 2 (two) times daily. 60 tablet 0  . metFORMIN (GLUCOPHAGE) 500 MG tablet Take 500-1,000 mg by mouth See admin instructions. Take 2 tablets in the morning then take 1 tablet in the evening    . predniSONE (DELTASONE) 20 MG tablet Take 2 tablets daily with breakfast. 10 tablet 0  . ranitidine (ZANTAC) 150 MG tablet TAKE  1 TABLET BY MOUTH 12 HOURS AFTER TAKING REYATAZ. (Patient taking differently: Take 150 mg by mouth 2 (two) times daily. ) 30 tablet 3  . senna (SENOKOT) 8.6 MG tablet Take 2 tablets by mouth 2 (two) times daily.    Marland Kitchen terbinafine (LAMISIL) 250 MG tablet Take 250 mg by mouth daily.    Marland Kitchen valproic acid (DEPAKENE) 250 MG capsule Take 500-1,000 mg by mouth See admin instructions. Take 2 capsules every morning then take 4 capsules at bedtime     No facility-administered medications prior to visit.      Past Medical History:  Diagnosis Date  . Anemia   . Anemia   . Anxiety   . Bipolar 1 disorder ([place])   . Depression, major   . HIV (human immunodeficiency virus infection) ([place])   . Hyperglycemia 08/06/2017  .  Hypernatremia   . Kidney failure   . Lithium toxicity   . MR (mental retardation)   . Schizoaffective disorder ([place]) 08/06/2017  . Schizophrenia [facility])      Past Surgical History:  Procedure Laterality Date  . BIOPSY N/A 04/12/2014   Procedure: BIOPSY;  Surgeon: Danie Binder, MD;  Location: [facility];  Service: Endoscopy;  Laterality: N/A;  Gastric  . COLONOSCOPY WITH PROPOFOL N/A 04/12/2014   SLF:  1. one colon polyp removed 2. The left colon is extremely redundant 3. Small internal hemorrhoids  . ESOPHAGOGASTRODUODENOSCOPY (EGD) WITH PROPOFOL N/A 04/12/2014   SLF: Stricture at the gastroesophageal junction 2. small polyp  in teh gastric body 3. Mild Non-erosive gastritis.   Marland Kitchen NOSE SURGERY  1968   unsure what type of surgery. something to help him breathe better  . POLYPECTOMY N/A 04/12/2014   Procedure: POLYPECTOMY;  Surgeon: Danie Binder, MD;  Location: [facility]S;  Service: Endoscopy;  Laterality: N/A;  Cecal  . SAVORY DILATION N/A 04/12/2014   Procedure: SAVORY DILATION;  Surgeon: Danie Binder, MD;  Location: [facility];  Service: Endoscopy;  Laterality: N/A;  12.8/ 14/15/16  . SKIN BIOPSY         Review of Systems  Unable to perform ROS: Other  Mental Handicapped    Objective:    BP 131/85   Pulse 90   Temp 98 F (36.7 C)   Ht '5\' 9"'$  (1.753 m)   Wt 196 lb (88.9 kg)   BMI 28.94 kg/m  Nursing note and vital signs reviewed.  Physical Exam  Constitutional: He is oriented to person, place, and time. He appears well-developed. No distress.  HENT:  Mouth/Throat: Oropharynx is clear and moist.  Poor dentition noted.   Eyes: Conjunctivae are normal.  Neck: Neck supple.  Cardiovascular: Normal rate, regular rhythm, normal heart sounds and intact distal pulses. Exam reveals no gallop and no friction rub.  No murmur heard. Pulmonary/Chest: Effort normal and breath sounds normal. No respiratory distress. He has no wheezes. He has no rales. He exhibits no tenderness.  Abdominal: Soft.  Bowel sounds are normal. There is no tenderness.  Lymphadenopathy:    He has no cervical adenopathy.  Neurological: He is alert and oriented to person, place, and time.  Skin: Skin is warm and dry. No rash noted.  Psychiatric: He has a normal mood and affect. His behavior is normal. Judgment and thought content normal.       Assessment & Plan:   Problem List Items Addressed This Visit      Genitourinary   CKD (chronic kidney disease) stage 3, GFR 30-59 ml/min (HCC)  Stable currently with most recent creatinine of 1.63 and eGFR of 53. No indication to change medication at this time. Continue to monitor.         Other   HIV disease ([place]) - Primary    Nathan [name] appears to have well controlled HIV disease with his ART of Biktarvy with no adverse side effects or missed doses. He has no problems obtaining his medication. No signs/symptoms of opportunistic infection. Prevnar updated today. Caregiver to check on influenza vaccination. Check CD4 and viral load today. Continue current dose of Biktarvy. Follow up office visit in 3 months or sooner if needed with lab work 1-2 weeks prior to appointment.       Relevant Orders   T-helper cell (CD4)- ([facility] clinic only)   HIV 1 RNA quant-no reflex-bld   T-helper cell (CD4)- ([facility] clinic only)   HIV-1 RNA quant-no reflex-bld   Comprehensive metabolic panel   RPR   Pneumococcal conjugate vaccine 13-valent IM (Completed)   Bipolar 1 disorder, mixed, moderate (HCC)    Stable with current regimen. Question if increased somnolence may be related to medications, although today he appears to be doing very well. Continue to monitor with changes per psychiatry.       Seizures ([place])    New seizure activity on 8/21 with description consistent with complex tonic-clonic lasting about 10-15 minutes. No further seizures since. Remains on Depakote and Keppra. Does not appear to be related to HIV medications. There are no interactions currently. Needs to  avoid Dilatin for interaction with Biktarvy. He has neurologist appointment scheduled for 1 month.        Other Visit Diagnoses    Need for vaccination with 13-polyvalent pneumococcal conjugate vaccine       Relevant Orders   Pneumococcal conjugate vaccine 13-valent IM (Completed)       I am having Ranell Patrick. Cosens maintain his ranitidine, folic acid, clozapine, senna, clonazePAM, haloperidol, gabapentin, gabapentin, predniSONE, cetirizine, bictegravir-emtricitabine-tenofovir AF, valproic acid, metFORMIN, terbinafine, benztropine, atorvastatin, aspirin, and levETIRAcetam.   No orders of the defined types were placed in this encounter.    Follow-up: Return in about 3 months (around 04/17/2018), or if symptoms worsen or fail to improve.   Terri Piedra, MSN, FNP-C Nurse Practitioner [facility] Office phone: [phone] Pager: [place] number: [phone]

## 2018-01-15 NOTE — Assessment & Plan Note (Signed)
Stable with current regimen. Question if increased somnolence may be related to medications, although today he appears to be doing very well. Continue to monitor with changes per psychiatry.

## 2018-01-15 NOTE — Assessment & Plan Note (Signed)
Stable currently with most recent creatinine of 1.63 and eGFR of 53. No indication to change medication at this time. Continue to monitor.

## 2018-01-16 LAB — T-HELPER CELL (CD4) - (RCID CLINIC ONLY)
CD4 T CELL HELPER: 28 % — AB (ref 33–55)
CD4 T Cell Abs: 750 /uL (ref 400–2700)

## 2018-01-19 LAB — HIV-1 RNA QUANT-NO REFLEX-BLD
HIV 1 RNA QUANT: NOT DETECTED {copies}/mL
HIV-1 RNA QUANT, LOG: NOT DETECTED {Log_copies}/mL

## 2018-01-21 ENCOUNTER — Other Ambulatory Visit: Payer: Self-pay | Admitting: Urgent Care

## 2018-02-09 ENCOUNTER — Ambulatory Visit (INDEPENDENT_AMBULATORY_CARE_PROVIDER_SITE_OTHER): Payer: Medicare Other | Admitting: Neurology

## 2018-02-09 ENCOUNTER — Encounter

## 2018-02-09 ENCOUNTER — Encounter: Payer: Self-pay | Admitting: Neurology

## 2018-02-09 VITALS — BP 113/70 | HR 79 | Ht 69.0 in | Wt 197.0 lb

## 2018-02-09 DIAGNOSIS — R269 Unspecified abnormalities of gait and mobility: Secondary | ICD-10-CM

## 2018-02-09 DIAGNOSIS — R569 Unspecified convulsions: Secondary | ICD-10-CM

## 2018-02-09 MED ORDER — VALPROIC ACID 250 MG PO CAPS: ORAL_CAPSULE | ORAL | 11 refills | Status: DC

## 2018-02-09 MED ORDER — LEVETIRACETAM 500 MG PO TABS: 500.0000 mg | ORAL_TABLET | Freq: Two times a day (BID) | ORAL | 4 refills | Status: DC

## 2018-02-09 NOTE — Progress Notes (Addendum)
PATIENT: Nathan [name] DOB: 06/20/1960  Chief Complaint  Patient presents with  . Seizures    He resides in a group home and is here today with his caregiver, [name].  Reports knowledge of two seizures in the last year.  She has also noticed a worsening in the following symptoms:  slurred speech, shuffling gait, holds his head in a downward position, fatigue.  Marland Kitchen PCP    Pavelock, Ralene Bathe, MD     HISTORICAL  Nathan [name] is a 57 year old male seen in request by her primary care physician Dr. Sallee Lange for evaluation of seizure, initial evaluation was on February 10, 2018.  He is accompanied by his group home manager [name] at today's visit.  He has lived in current group home for 3 years,  I have reviewed and summarized the referring note from the referring physician.  He had a past medical history of hyperlipidemia, schizophrenia, HIV, on polypharmacy treatment.  He had two witnessed seizures, the first 1 was in 2018, he suddenly fell out of the chair, whole body shaking, confused afterwards, lasting for few minutes, most recent one was on November 26, 2017, was taken to the emergency room, she was noted to have sudden onset rhythmic body jerking, patient was unable to provide detailed history, he denies any prodrome, has no retraction of the event,  He was already taking Depakote 250 mg 2 tablets in the morning, 4 tablets at night, then Keppra 500 mg twice daily was started since November 27, 2017, he tolerated the medication well, there was no significant side effect noted.  Reviewed laboratory evaluation at emergency room, Depakote level was less than 10, suggest noncompliance, UDS was positive for benzodiazepine, CMP showed elevated creatinine 2.1, hemoglobin mildly decreased 12.2, CD4 count was 750, HIV virus load was not detected  [name] also reported that over the past few months, he had intermittent catching while walking, sometimes he is very sleepy after taking his  medications, lack of coordination,  REVIEW OF SYSTEMS: Full 14 system review of systems performed and notable only for confusion, slurred speech, seizure, too much sleep All other review of systems were negative.  ALLERGIES: No Known Allergies  HOME MEDICATIONS: Current Outpatient Medications  Medication Sig Dispense Refill  . aspirin 81 MG chewable tablet Chew 81 mg by mouth daily.    Marland Kitchen atorvastatin (LIPITOR) 20 MG tablet Take 20 mg by mouth daily.    . benztropine (COGENTIN) 2 MG tablet Take 2 mg by mouth 2 (two) times daily.    . bictegravir-emtricitabine-tenofovir AF (BIKTARVY) 50-200-25 MG TABS tablet Take 1 tablet by mouth daily. 30 tablet 11  . cetirizine (ZYRTEC) 10 MG tablet TAKE 1 TABLET ONCE DAILY. 30 tablet 3  . clonazePAM (KLONOPIN) 2 MG tablet Take 0.5 tablets (1 mg total) by mouth at bedtime.    . clozapine (FAZACLO) 100 MG disintegrating tablet Take 50-100 mg by mouth See admin instructions. Take 1 tablet in the morning and take 1/2 tablet every evening    . folic acid (FOLVITE) 1 MG tablet Take 1 mg by mouth daily.    Marland Kitchen gabapentin (NEURONTIN) 300 MG capsule Take 300 mg by mouth daily. With 400mg  capsule     . gabapentin (NEURONTIN) 400 MG capsule Take 400 mg by mouth 2 (two) times daily.     . haloperidol (HALDOL) 5 MG tablet Take 2 tablets (10 mg total) by mouth daily with supper. (Patient taking differently: Take 5 mg by mouth daily with  supper. )    . levETIRAcetam (KEPPRA) 500 MG tablet Take 1 tablet (500 mg total) by mouth 2 (two) times daily. 60 tablet 0  . metFORMIN (GLUCOPHAGE) 500 MG tablet Take 500-1,000 mg by mouth See admin instructions. Take 2 tablets in the morning then take 1 tablet in the evening    . predniSONE (DELTASONE) 20 MG tablet Take 2 tablets daily with breakfast. 10 tablet 0  . ranitidine (ZANTAC) 150 MG tablet TAKE 1 TABLET BY MOUTH 12 HOURS AFTER TAKING REYATAZ. (Patient taking differently: Take 150 mg by mouth 2 (two) times daily. ) 30 tablet 3    . senna (SENOKOT) 8.6 MG tablet Take 2 tablets by mouth 2 (two) times daily.    Marland Kitchen terbinafine (LAMISIL) 250 MG tablet Take 250 mg by mouth daily.    Marland Kitchen valproic acid (DEPAKENE) 250 MG capsule Take 500-1,000 mg by mouth See admin instructions. Take 2 capsules every morning then take 4 capsules at bedtime     No current facility-administered medications for this visit.     PAST MEDICAL HISTORY: Past Medical History:  Diagnosis Date  . Anemia   . Anemia   . Anxiety   . Bipolar 1 disorder ([place])   . Depression, major   . HIV (human immunodeficiency virus infection) ([place])   . Hyperglycemia 08/06/2017  . Hypernatremia   . Kidney failure   . Lithium toxicity   . MR (mental retardation)   . Schizoaffective disorder ([place]) 08/06/2017  . Schizophrenia ([place])   . Seizures ([place])     PAST SURGICAL HISTORY: Past Surgical History:  Procedure Laterality Date  . BIOPSY N/A 04/12/2014   Procedure: BIOPSY;  Surgeon: Danie Binder, MD;  Location: [facility];  Service: Endoscopy;  Laterality: N/A;  Gastric  . COLONOSCOPY WITH PROPOFOL N/A 04/12/2014   SLF:  1. one colon polyp removed 2. The left colon is extremely redundant 3. Small internal hemorrhoids  . ESOPHAGOGASTRODUODENOSCOPY (EGD) WITH PROPOFOL N/A 04/12/2014   SLF: Stricture at the gastroesophageal junction 2. small polyp  in teh gastric body 3. Mild Non-erosive gastritis.   Marland Kitchen NOSE SURGERY  1968   unsure what type of surgery. something to help him breathe better  . POLYPECTOMY N/A 04/12/2014   Procedure: POLYPECTOMY;  Surgeon: Danie Binder, MD;  Location: [facility];  Service: Endoscopy;  Laterality: N/A;  Cecal  . SAVORY DILATION N/A 04/12/2014   Procedure: SAVORY DILATION;  Surgeon: Danie Binder, MD;  Location: [facility];  Service: Endoscopy;  Laterality: N/A;  12.8/ 14/15/16  . SKIN BIOPSY      FAMILY HISTORY: Family History  Problem Relation Age of Onset  . Other Mother        unknown medical history  . Other Father        unknown medical history   . Colon cancer Neg Hx     SOCIAL HISTORY: Social History   Socioeconomic History  . Marital status: Single    Spouse name: Not on file  . Number of children: 0  . Years of education: 9th grade  . Highest education level: Not on file  Occupational History  . Occupation: Disabled  Social Needs  . Financial resource strain: Not on file  . Food insecurity:    Worry: Not on file    Inability: Not on file  . Transportation needs:    Medical: Not on file    Non-medical: Not on file  Tobacco Use  . Smoking status: Never Smoker  .  Smokeless tobacco: Never Used  Substance and Sexual Activity  . Alcohol use: No    Alcohol/week: 0.0 standard drinks  . Drug use: No  . Sexual activity: Never    Birth control/protection: Abstinence    Comment: DECLINED CONDOMS  Lifestyle  . Physical activity:    Days per week: Not on file    Minutes per session: Not on file  . Stress: Not on file  Relationships  . Social connections:    Talks on phone: Not on file    Gets together: Not on file    Attends religious service: Not on file    Active member of club or organization: Not on file    Attends meetings of clubs or organizations: Not on file    Relationship status: Not on file  . Intimate partner violence:    Fear of current or ex partner: Not on file    Emotionally abused: Not on file    Physically abused: Not on file    Forced sexual activity: Not on file  Other Topics Concern  . Not on file  Social History Narrative   Lives in group home.   Right-handed.   2-3 sodas per day.     PHYSICAL EXAM   Vitals:   02/09/18 0724  BP: 113/70  Pulse: 79  Weight: 197 lb (89.4 kg)  Height: 5\' 9"  (1.753 m)    Not recorded      Body mass index is 29.09 kg/m.  PHYSICAL EXAMNIATION:  Gen: NAD, conversant, well nourised, obese, well groomed                     Cardiovascular: Regular rate rhythm, no peripheral edema, warm, nontender. Eyes: Conjunctivae clear without exudates or  hemorrhage Neck: Supple, no carotid bruits. Pulmonary: Clear to auscultation bilaterally   NEUROLOGICAL EXAM:  MENTAL STATUS: Speech/cognition: endentured speech, mild slurring, rely on his manager to answer questions,   CRANIAL NERVES: CN II: Visual fields are full to confrontation.  Pupils are round equal and briskly reactive to light. CN III, IV, VI: extraocular movement are normal. No ptosis. CN V: Facial sensation is intact to pinprick in all 3 divisions bilaterally. Corneal responses are intact.  CN VII: Face is symmetric with normal eye closure and smile. CN VIII: Hearing is normal to rubbing fingers CN IX, X: Palate elevates symmetrically. Phonation is normal. CN XI: Head turning and shoulder shrug are intact CN XII: Tongue is midline with normal movements and no atrophy.  MOTOR: Move all 4 extremity without any difficulty, no significant rigidity, bradykinesia,  REFLEXES: Reflexes are 2+ and symmetric at the biceps, triceps, knees, and ankles. Plantar responses are flexor.  SENSORY: Withdraw to pain  COORDINATION: Rapid alternating movements and fine finger movements are intact. There is no dysmetria on finger-to-nose and heel-knee-shin.    GAIT/STANCE: Need to push up to get up from seated position, mildly unsteady,   DIAGNOSTIC DATA (LABS, IMAGING, TESTING) - I reviewed patient records, labs, notes, testing and imaging myself where available.   ASSESSMENT AND PLAN  ALIKA SALADIN is a 57 y.o. male   Seizure  Most recent one was on November 26, 2017  Currently taking Depakote DR 250 mg 2 tablets in the morning, 4 tablets at nighttime, keppra 500 mg twice daily  Unsteady gait:  Could due to his polypharmacy,  Check medication level  Referred to physical therapy   Marcial Pacas, M.D. Ph.D.  [facility] [address], [address]  [place],  [address] Ph: [phone] Fax: [phone]  CC: Pavelock, Ralene Bathe, MD

## 2018-02-10 ENCOUNTER — Other Ambulatory Visit: Payer: Medicare Other

## 2018-02-10 ENCOUNTER — Telehealth: Payer: Self-pay | Admitting: Neurology

## 2018-02-10 LAB — VALPROIC ACID LEVEL: VALPROIC ACID LVL: 72 ug/mL (ref 50–100)

## 2018-02-10 LAB — LEVETIRACETAM LEVEL: Levetiracetam Lvl: <1 ug/mL — ABNORMAL LOW (ref 10.0–40.0)

## 2018-02-10 NOTE — Telephone Encounter (Signed)
Attempted to reach his caregivers at [facility] (on [facility]) - no answer and mailbox full.  I left message for his legal guardian, Linna Darner (on [place]) requesting a call back to discuss his lab results.

## 2018-02-10 NOTE — Telephone Encounter (Signed)
Please call his facility, this time the Keppra level was less than 1, means he has not been taking Keppra, Depakote level was 72,  When he presented to the emergency room for seizure on November 26, 2017, his Depakote level was less than 10   Please emphasized the importance of being compliant with his antiepileptic medications, per record, he should take Depakote DR 250 mg 2 tabs in am and 500mg  qhs, keppra 500mg  bid.

## 2018-02-11 NOTE — Telephone Encounter (Signed)
I was able to speak to [place], patient's caregiver at [facility] (on [facility]).  She verbalized understanding of the importance of his medication compliance.  She reported that his Keppra was being ordered at the pharmacy and she will pick up his prescription today.  She will make sure his takes it and his Depakote correctly.

## 2018-02-16 ENCOUNTER — Other Ambulatory Visit: Payer: Self-pay

## 2018-02-16 ENCOUNTER — Emergency Department (HOSPITAL_COMMUNITY): Payer: Medicare Other

## 2018-02-16 ENCOUNTER — Encounter (HOSPITAL_COMMUNITY): Payer: Self-pay | Admitting: Family Medicine

## 2018-02-16 ENCOUNTER — Inpatient Hospital Stay (HOSPITAL_COMMUNITY)
Admission: EM | Admit: 2018-02-16 | Discharge: 2018-02-24 | DRG: 974 | Disposition: A | Discharge status: Home Health | Payer: Medicare Other | Attending: Internal Medicine | Admitting: Internal Medicine

## 2018-02-16 DIAGNOSIS — Z7952 Long term (current) use of systemic steroids: Secondary | ICD-10-CM

## 2018-02-16 DIAGNOSIS — R471 Dysarthria and anarthria: Secondary | ICD-10-CM | POA: Diagnosis present

## 2018-02-16 DIAGNOSIS — R197 Diarrhea, unspecified: Secondary | ICD-10-CM | POA: Diagnosis not present

## 2018-02-16 DIAGNOSIS — K219 Gastro-esophageal reflux disease without esophagitis: Secondary | ICD-10-CM | POA: Diagnosis present

## 2018-02-16 DIAGNOSIS — R402212 Coma scale, best verbal response, none, at arrival to emergency department: Secondary | ICD-10-CM | POA: Diagnosis present

## 2018-02-16 DIAGNOSIS — E860 Dehydration: Secondary | ICD-10-CM | POA: Diagnosis present

## 2018-02-16 DIAGNOSIS — E876 Hypokalemia: Secondary | ICD-10-CM | POA: Diagnosis not present

## 2018-02-16 DIAGNOSIS — N183 Chronic kidney disease, stage 3 unspecified: Secondary | ICD-10-CM | POA: Diagnosis present

## 2018-02-16 DIAGNOSIS — E785 Hyperlipidemia, unspecified: Secondary | ICD-10-CM | POA: Diagnosis present

## 2018-02-16 DIAGNOSIS — E1122 Type 2 diabetes mellitus with diabetic chronic kidney disease: Secondary | ICD-10-CM | POA: Diagnosis present

## 2018-02-16 DIAGNOSIS — G9340 Encephalopathy, unspecified: Secondary | ICD-10-CM | POA: Diagnosis present

## 2018-02-16 DIAGNOSIS — E870 Hyperosmolality and hypernatremia: Secondary | ICD-10-CM | POA: Diagnosis not present

## 2018-02-16 DIAGNOSIS — A419 Sepsis, unspecified organism: Principal | ICD-10-CM | POA: Diagnosis present

## 2018-02-16 DIAGNOSIS — E1140 Type 2 diabetes mellitus with diabetic neuropathy, unspecified: Secondary | ICD-10-CM | POA: Diagnosis present

## 2018-02-16 DIAGNOSIS — R0602 Shortness of breath: Secondary | ICD-10-CM

## 2018-02-16 DIAGNOSIS — K567 Ileus, unspecified: Secondary | ICD-10-CM | POA: Diagnosis not present

## 2018-02-16 DIAGNOSIS — A044 Other intestinal Escherichia coli infections: Secondary | ICD-10-CM | POA: Diagnosis present

## 2018-02-16 DIAGNOSIS — N184 Chronic kidney disease, stage 4 (severe): Secondary | ICD-10-CM | POA: Diagnosis present

## 2018-02-16 DIAGNOSIS — R109 Unspecified abdominal pain: Secondary | ICD-10-CM

## 2018-02-16 DIAGNOSIS — F259 Schizoaffective disorder, unspecified: Secondary | ICD-10-CM | POA: Diagnosis present

## 2018-02-16 DIAGNOSIS — J9811 Atelectasis: Secondary | ICD-10-CM | POA: Diagnosis present

## 2018-02-16 DIAGNOSIS — F3162 Bipolar disorder, current episode mixed, moderate: Secondary | ICD-10-CM | POA: Diagnosis present

## 2018-02-16 DIAGNOSIS — R339 Retention of urine, unspecified: Secondary | ICD-10-CM | POA: Diagnosis not present

## 2018-02-16 DIAGNOSIS — G92 Toxic encephalopathy: Secondary | ICD-10-CM | POA: Diagnosis present

## 2018-02-16 DIAGNOSIS — J189 Pneumonia, unspecified organism: Secondary | ICD-10-CM | POA: Diagnosis not present

## 2018-02-16 DIAGNOSIS — Z7982 Long term (current) use of aspirin: Secondary | ICD-10-CM | POA: Diagnosis not present

## 2018-02-16 DIAGNOSIS — Z4659 Encounter for fitting and adjustment of other gastrointestinal appliance and device: Secondary | ICD-10-CM

## 2018-02-16 DIAGNOSIS — J181 Lobar pneumonia, unspecified organism: Secondary | ICD-10-CM

## 2018-02-16 DIAGNOSIS — F79 Unspecified intellectual disabilities: Secondary | ICD-10-CM | POA: Diagnosis present

## 2018-02-16 DIAGNOSIS — B20 Human immunodeficiency virus [HIV] disease: Secondary | ICD-10-CM | POA: Diagnosis present

## 2018-02-16 DIAGNOSIS — G40909 Epilepsy, unspecified, not intractable, without status epilepticus: Secondary | ICD-10-CM | POA: Diagnosis present

## 2018-02-16 DIAGNOSIS — N179 Acute kidney failure, unspecified: Secondary | ICD-10-CM | POA: Diagnosis not present

## 2018-02-16 DIAGNOSIS — R4182 Altered mental status, unspecified: Secondary | ICD-10-CM | POA: Diagnosis present

## 2018-02-16 DIAGNOSIS — R402352 Coma scale, best motor response, localizes pain, at arrival to emergency department: Secondary | ICD-10-CM | POA: Diagnosis present

## 2018-02-16 DIAGNOSIS — K56609 Unspecified intestinal obstruction, unspecified as to partial versus complete obstruction: Secondary | ICD-10-CM | POA: Diagnosis not present

## 2018-02-16 DIAGNOSIS — R338 Other retention of urine: Secondary | ICD-10-CM | POA: Diagnosis present

## 2018-02-16 DIAGNOSIS — K5900 Constipation, unspecified: Secondary | ICD-10-CM | POA: Diagnosis present

## 2018-02-16 DIAGNOSIS — R402142 Coma scale, eyes open, spontaneous, at arrival to emergency department: Secondary | ICD-10-CM | POA: Diagnosis present

## 2018-02-16 DIAGNOSIS — Z0189 Encounter for other specified special examinations: Secondary | ICD-10-CM

## 2018-02-16 LAB — URINALYSIS, ROUTINE W REFLEX MICROSCOPIC
Bilirubin Urine: NEGATIVE
Glucose, UA: NEGATIVE mg/dL
Hgb urine dipstick: NEGATIVE
Ketones, ur: 5 mg/dL — AB
Leukocytes, UA: NEGATIVE
NITRITE: NEGATIVE
Protein, ur: NEGATIVE mg/dL
SPECIFIC GRAVITY, URINE: 1.017 (ref 1.005–1.030)
pH: 5 (ref 5.0–8.0)

## 2018-02-16 LAB — CBC WITH DIFFERENTIAL/PLATELET
Abs Immature Granulocytes: 0.05 10*3/uL (ref 0.00–0.07)
BASOS ABS: 0 10*3/uL (ref 0.0–0.1)
Basophils Relative: 0 %
EOS ABS: 0.1 10*3/uL (ref 0.0–0.5)
EOS PCT: 1 %
HEMATOCRIT: 44.9 % (ref 39.0–52.0)
HEMOGLOBIN: 13.2 g/dL (ref 13.0–17.0)
IMMATURE GRANULOCYTES: 0 %
LYMPHS ABS: 1.2 10*3/uL (ref 0.7–4.0)
LYMPHS PCT: 10 %
MCH: 27.9 pg (ref 26.0–34.0)
MCHC: 29.4 g/dL — AB (ref 30.0–36.0)
MCV: 94.9 fL (ref 80.0–100.0)
MONOS PCT: 15 %
Monocytes Absolute: 1.9 10*3/uL — ABNORMAL HIGH (ref 0.1–1.0)
NRBC: 0 % (ref 0.0–0.2)
Neutro Abs: 9.2 10*3/uL — ABNORMAL HIGH (ref 1.7–7.7)
Neutrophils Relative %: 74 %
Platelets: 155 10*3/uL (ref 150–400)
RBC: 4.73 MIL/uL (ref 4.22–5.81)
RDW: 13.9 % (ref 11.5–15.5)
WBC: 12.5 10*3/uL — ABNORMAL HIGH (ref 4.0–10.5)

## 2018-02-16 LAB — GASTROINTESTINAL PANEL BY PCR, STOOL (REPLACES STOOL CULTURE)
ADENOVIRUS F40/41: NOT DETECTED
ASTROVIRUS: NOT DETECTED
CAMPYLOBACTER SPECIES: NOT DETECTED
CYCLOSPORA CAYETANENSIS: NOT DETECTED
Cryptosporidium: NOT DETECTED
ENTEROPATHOGENIC E COLI (EPEC): NOT DETECTED
ENTEROTOXIGENIC E COLI (ETEC): NOT DETECTED
Entamoeba histolytica: NOT DETECTED
Enteroaggregative E coli (EAEC): DETECTED — AB
GIARDIA LAMBLIA: NOT DETECTED
Norovirus GI/GII: NOT DETECTED
PLESIMONAS SHIGELLOIDES: NOT DETECTED
Rotavirus A: NOT DETECTED
Salmonella species: NOT DETECTED
Sapovirus (I, II, IV, and V): NOT DETECTED
Shiga like toxin producing E coli (STEC): NOT DETECTED
Shigella/Enteroinvasive E coli (EIEC): NOT DETECTED
VIBRIO CHOLERAE: NOT DETECTED
VIBRIO SPECIES: NOT DETECTED
Yersinia enterocolitica: NOT DETECTED

## 2018-02-16 LAB — I-STAT CG4 LACTIC ACID, ED
LACTIC ACID, VENOUS: 4.16 mmol/L — AB (ref 0.5–1.9)
LACTIC ACID, VENOUS: 4.7 mmol/L — AB (ref 0.5–1.9)
LACTIC ACID, VENOUS: 5.65 mmol/L — AB (ref 0.5–1.9)

## 2018-02-16 LAB — CBG MONITORING, ED
GLUCOSE-CAPILLARY: 201 mg/dL — AB (ref 70–99)
Glucose-Capillary: 145 mg/dL — ABNORMAL HIGH (ref 70–99)

## 2018-02-16 LAB — RAPID URINE DRUG SCREEN, HOSP PERFORMED
Amphetamines: NOT DETECTED
BARBITURATES: NOT DETECTED
Benzodiazepines: NOT DETECTED
COCAINE: NOT DETECTED
OPIATES: NOT DETECTED
Tetrahydrocannabinol: NOT DETECTED

## 2018-02-16 LAB — MRSA PCR SCREENING: MRSA BY PCR: POSITIVE — AB

## 2018-02-16 LAB — COMPREHENSIVE METABOLIC PANEL
ALBUMIN: 3.9 g/dL (ref 3.5–5.0)
ALT: 17 U/L (ref 0–44)
AST: 24 U/L (ref 15–41)
Alkaline Phosphatase: 52 U/L (ref 38–126)
Anion gap: 13 (ref 5–15)
BILIRUBIN TOTAL: 0.6 mg/dL (ref 0.3–1.2)
BUN: 21 mg/dL — AB (ref 6–20)
CO2: 24 mmol/L (ref 22–32)
Calcium: 9 mg/dL (ref 8.9–10.3)
Chloride: 103 mmol/L (ref 98–111)
Creatinine, Ser: 2.05 mg/dL — ABNORMAL HIGH (ref 0.61–1.24)
GFR calc Af Amer: 39 mL/min — ABNORMAL LOW (ref >60)
GFR calc non Af Amer: 34 mL/min — ABNORMAL LOW (ref >60)
GLUCOSE: 183 mg/dL — AB (ref 70–99)
Potassium: 4.2 mmol/L (ref 3.5–5.1)
Sodium: 140 mmol/L (ref 135–145)
TOTAL PROTEIN: 8.3 g/dL — AB (ref 6.5–8.1)

## 2018-02-16 LAB — I-STAT TROPONIN, ED: Troponin i, poc: 0 ng/mL (ref 0.00–0.08)

## 2018-02-16 LAB — LACTIC ACID, PLASMA
LACTIC ACID, VENOUS: 4.1 mmol/L — AB (ref 0.5–1.9)
Lactic Acid, Venous: 3.1 mmol/L (ref 0.5–1.9)

## 2018-02-16 LAB — C DIFFICILE QUICK SCREEN W PCR REFLEX
C DIFFICLE (CDIFF) ANTIGEN: NEGATIVE
C Diff interpretation: NOT DETECTED
C Diff toxin: NEGATIVE

## 2018-02-16 LAB — AMMONIA: Ammonia: 29 umol/L (ref 9–35)

## 2018-02-16 LAB — GLUCOSE, CAPILLARY: Glucose-Capillary: 155 mg/dL — ABNORMAL HIGH (ref 70–99)

## 2018-02-16 MED ORDER — GABAPENTIN 300 MG PO CAPS: 300.0000 mg | ORAL_CAPSULE | Freq: Every day | ORAL | Status: DC

## 2018-02-16 MED ORDER — HALOPERIDOL 5 MG PO TABS
5.0000 mg | ORAL_TABLET | Freq: Every day | ORAL | Status: DC
  Administered 2018-02-16 – 2018-02-24 (×7): 5 mg via ORAL
  Filled 2018-02-16 (×10): qty 1

## 2018-02-16 MED ORDER — SODIUM CHLORIDE 0.9 % IV BOLUS (SEPSIS)
1000.0000 mL | Freq: Once | INTRAVENOUS | Status: AC
  Administered 2018-02-16: 1000 mL via INTRAVENOUS

## 2018-02-16 MED ORDER — CLOZAPINE 25 MG PO TABS
25.0000 mg | ORAL_TABLET | Freq: Every day | ORAL | Status: DC
  Administered 2018-02-16 – 2018-02-18 (×3): 25 mg via ORAL
  Filled 2018-02-16 (×3): qty 1

## 2018-02-16 MED ORDER — SODIUM CHLORIDE 0.9% FLUSH
3.0000 mL | Freq: Two times a day (BID) | INTRAVENOUS | Status: DC
  Administered 2018-02-16 – 2018-02-24 (×11): 3 mL via INTRAVENOUS

## 2018-02-16 MED ORDER — ONDANSETRON HCL 4 MG/2ML IJ SOLN
4.0000 mg | Freq: Four times a day (QID) | INTRAMUSCULAR | Status: DC | PRN
  Filled 2018-02-16: qty 2

## 2018-02-16 MED ORDER — ACETAMINOPHEN 325 MG PO TABS
325.0000 mg | ORAL_TABLET | Freq: Once | ORAL | Status: AC
  Administered 2018-02-16: 325 mg via ORAL
  Filled 2018-02-16: qty 1

## 2018-02-16 MED ORDER — BICTEGRAVIR-EMTRICITAB-TENOFOV 50-200-25 MG PO TABS
1.0000 | ORAL_TABLET | Freq: Every day | ORAL | Status: DC
  Administered 2018-02-16 – 2018-02-24 (×9): 1 via ORAL
  Filled 2018-02-16 (×9): qty 1

## 2018-02-16 MED ORDER — SODIUM CHLORIDE 0.9 % IV SOLN
500.0000 mg | INTRAVENOUS | Status: DC
  Filled 2018-02-16: qty 500

## 2018-02-16 MED ORDER — GABAPENTIN 400 MG PO CAPS: 400.0000 mg | ORAL_CAPSULE | Freq: Two times a day (BID) | ORAL | Status: DC

## 2018-02-16 MED ORDER — ACETAMINOPHEN 650 MG RE SUPP: 650.0000 mg | Freq: Four times a day (QID) | RECTAL | Status: DC | PRN

## 2018-02-16 MED ORDER — LEVETIRACETAM 500 MG PO TABS
500.0000 mg | ORAL_TABLET | Freq: Two times a day (BID) | ORAL | Status: DC
  Administered 2018-02-16 – 2018-02-18 (×5): 500 mg via ORAL
  Filled 2018-02-16 (×5): qty 1

## 2018-02-16 MED ORDER — INSULIN ASPART 100 UNIT/ML ~~LOC~~ SOLN
0.0000 [IU] | Freq: Three times a day (TID) | SUBCUTANEOUS | Status: DC
  Administered 2018-02-16: 2 [IU] via SUBCUTANEOUS
  Administered 2018-02-16: 0 [IU] via SUBCUTANEOUS
  Administered 2018-02-17: 2 [IU] via SUBCUTANEOUS
  Administered 2018-02-17 – 2018-02-18 (×4): 1 [IU] via SUBCUTANEOUS
  Filled 2018-02-16: qty 1

## 2018-02-16 MED ORDER — GABAPENTIN 300 MG PO CAPS
300.0000 mg | ORAL_CAPSULE | Freq: Two times a day (BID) | ORAL | Status: DC
  Administered 2018-02-16 – 2018-02-17 (×4): 300 mg via ORAL
  Filled 2018-02-16 (×5): qty 1

## 2018-02-16 MED ORDER — SODIUM CHLORIDE 0.9 % IV SOLN
2.0000 g | INTRAVENOUS | Status: DC
  Filled 2018-02-16: qty 20

## 2018-02-16 MED ORDER — BENZTROPINE MESYLATE 1 MG PO TABS
1.0000 mg | ORAL_TABLET | Freq: Two times a day (BID) | ORAL | Status: DC
  Administered 2018-02-16 – 2018-02-24 (×16): 1 mg via ORAL
  Filled 2018-02-16 (×16): qty 1

## 2018-02-16 MED ORDER — SODIUM CHLORIDE 0.9 % IV SOLN
2.0000 g | INTRAVENOUS | Status: DC
  Administered 2018-02-16: 2 g via INTRAVENOUS
  Filled 2018-02-16: qty 20

## 2018-02-16 MED ORDER — SODIUM CHLORIDE 0.9 % IV SOLN
500.0000 mg | INTRAVENOUS | Status: DC
  Administered 2018-02-16: 500 mg via INTRAVENOUS
  Filled 2018-02-16: qty 500

## 2018-02-16 MED ORDER — INSULIN ASPART 100 UNIT/ML ~~LOC~~ SOLN: 0.0000 [IU] | Freq: Every day | SUBCUTANEOUS | Status: DC

## 2018-02-16 MED ORDER — CLOZAPINE 100 MG PO TBDP: 50.0000 mg | ORAL_TABLET | ORAL | Status: DC

## 2018-02-16 MED ORDER — VALPROIC ACID 250 MG PO CAPS
1000.0000 mg | ORAL_CAPSULE | Freq: Every day | ORAL | Status: DC
  Administered 2018-02-16 – 2018-02-17 (×2): 1000 mg via ORAL
  Filled 2018-02-16 (×2): qty 4

## 2018-02-16 MED ORDER — FOLIC ACID 1 MG PO TABS
1.0000 mg | ORAL_TABLET | Freq: Every day | ORAL | Status: DC
  Administered 2018-02-16 – 2018-02-24 (×9): 1 mg via ORAL
  Filled 2018-02-16 (×9): qty 1

## 2018-02-16 MED ORDER — FAMOTIDINE 20 MG PO TABS
20.0000 mg | ORAL_TABLET | Freq: Two times a day (BID) | ORAL | Status: DC
  Administered 2018-02-16 – 2018-02-24 (×16): 20 mg via ORAL
  Filled 2018-02-16 (×16): qty 1

## 2018-02-16 MED ORDER — CLONAZEPAM 1 MG PO TABS
1.0000 mg | ORAL_TABLET | Freq: Every day | ORAL | Status: DC
  Administered 2018-02-16 – 2018-02-17 (×2): 1 mg via ORAL
  Filled 2018-02-16 (×2): qty 1

## 2018-02-16 MED ORDER — SODIUM CHLORIDE 0.9 % IV BOLUS
500.0000 mL | Freq: Once | INTRAVENOUS | Status: AC
  Administered 2018-02-16: 23:00:00 via INTRAVENOUS

## 2018-02-16 MED ORDER — SODIUM CHLORIDE 0.9 % IV SOLN
INTRAVENOUS | Status: AC
  Administered 2018-02-16: 05:00:00 via INTRAVENOUS

## 2018-02-16 MED ORDER — BENZTROPINE MESYLATE 1 MG PO TABS: 2.0000 mg | ORAL_TABLET | Freq: Two times a day (BID) | ORAL | Status: DC

## 2018-02-16 MED ORDER — HEPARIN SODIUM (PORCINE) 5000 UNIT/ML IJ SOLN
5000.0000 [IU] | Freq: Three times a day (TID) | INTRAMUSCULAR | Status: DC
  Administered 2018-02-16 – 2018-02-24 (×25): 5000 [IU] via SUBCUTANEOUS
  Filled 2018-02-16 (×25): qty 1

## 2018-02-16 MED ORDER — CIPROFLOXACIN HCL 500 MG PO TABS
500.0000 mg | ORAL_TABLET | Freq: Two times a day (BID) | ORAL | Status: DC
  Administered 2018-02-16 – 2018-02-17 (×2): 500 mg via ORAL
  Filled 2018-02-16 (×2): qty 1

## 2018-02-16 MED ORDER — CHLORHEXIDINE GLUCONATE CLOTH 2 % EX PADS
6.0000 | MEDICATED_PAD | Freq: Every day | CUTANEOUS | Status: AC
  Administered 2018-02-16 – 2018-02-21 (×5): 6 via TOPICAL

## 2018-02-16 MED ORDER — VALPROIC ACID 250 MG PO CAPS
500.0000 mg | ORAL_CAPSULE | Freq: Every day | ORAL | Status: DC
  Administered 2018-02-16 – 2018-02-18 (×3): 500 mg via ORAL
  Filled 2018-02-16 (×3): qty 2

## 2018-02-16 MED ORDER — ONDANSETRON HCL 4 MG PO TABS: 4.0000 mg | ORAL_TABLET | Freq: Four times a day (QID) | ORAL | Status: DC | PRN

## 2018-02-16 MED ORDER — GABAPENTIN 400 MG PO CAPS: 700.0000 mg | ORAL_CAPSULE | Freq: Two times a day (BID) | ORAL | Status: DC

## 2018-02-16 MED ORDER — MUPIROCIN 2 % EX OINT
1.0000 "application " | TOPICAL_OINTMENT | Freq: Two times a day (BID) | CUTANEOUS | Status: AC
  Administered 2018-02-17 – 2018-02-21 (×10): 1 via NASAL
  Filled 2018-02-16 (×2): qty 22

## 2018-02-16 MED ORDER — ACETAMINOPHEN 325 MG PO TABS
650.0000 mg | ORAL_TABLET | Freq: Four times a day (QID) | ORAL | Status: DC | PRN
  Administered 2018-02-16: 650 mg via ORAL
  Filled 2018-02-16: qty 2

## 2018-02-16 MED ORDER — ATORVASTATIN CALCIUM 20 MG PO TABS
20.0000 mg | ORAL_TABLET | Freq: Every day | ORAL | Status: DC
  Administered 2018-02-16 – 2018-02-24 (×8): 20 mg via ORAL
  Filled 2018-02-16 (×8): qty 1

## 2018-02-16 MED ORDER — VALPROIC ACID 250 MG PO CAPS: 250.0000 mg | ORAL_CAPSULE | ORAL | Status: DC

## 2018-02-16 MED ORDER — ASPIRIN 81 MG PO CHEW
81.0000 mg | CHEWABLE_TABLET | Freq: Every day | ORAL | Status: DC
  Administered 2018-02-16 – 2018-02-24 (×9): 81 mg via ORAL
  Filled 2018-02-16 (×9): qty 1

## 2018-02-16 NOTE — ED Notes (Signed)
Breakfest tray ordered

## 2018-02-16 NOTE — ED Notes (Signed)
Dr Betsey Holiday informed of lactic acid results 4.16

## 2018-02-16 NOTE — H&P (Signed)
History and Physical    Nathan [name] GEX:[id number] DOB: 1960/06/17 DOA: 02/16/2018  PCP: Javier Docker, MD   Patient coming from: Home   Chief Complaint: Gen weakness, lethargy   HPI: Nathan [name] is a 57 y.o. male with medical history significant for MR, bipolar disorder, HIV/AIDS, type 2 diabetes mellitus, CKD III, and seizure disorder, now presenting from his group home for evaluation of generalized weakness and lethargy.  Per chart review, patient has chronic dysarthria and usually relies on his caregiver to answer questions when he is in the doctor's office, but was noted to not be speaking today at the group home and seemed to be lethargic, worsening over the course of the day.  She had not been voicing any specific complaints.  ED Course: Upon arrival to the ED, patient is found to have a temp of 37.7 C, saturating mid 90s on room air, tachypneic to 30, mildly tachycardic, and with stable blood pressure.  EKG features a sinus rhythm with inferior T wave abnormality.  Noncontrast head CT is negative for acute intracranial abnormality.  Chest x-ray features a mild opacity at the left base concerning for possible pneumonia.  Chemistry panel is notable for a creatinine of 2.05, similar to priors.  CBC features a leukocytosis to 12,500.  Initial lactic acid is elevated to 5.65.  Patient was given 3 L of normal saline, blood and urine cultures were sent, and he was treated with azithromycin and Rocephin.  He went on to have multiple episodes of foul-smelling diarrhea in the emergency department, was placed on enteric precautions, and sample was sent for GI pathogen panel and C. difficile PCR.  Patient has improved in the ED as manifest by now answering questions appropriately.  Blood pressure has remained stable.  He will be admitted for ongoing evaluation and management of sepsis suspected secondary to pneumonia.  Review of Systems:  Unable to complete ROS secondary to patient's  clinical condition.  Past Medical History:  Diagnosis Date  . Anemia   . Anemia   . Anxiety   . Bipolar 1 disorder ([place])   . Depression, major   . HIV (human immunodeficiency virus infection) ([place])   . Hyperglycemia 08/06/2017  . Hypernatremia   . Kidney failure   . Lithium toxicity   . MR (mental retardation)   . Schizoaffective disorder ([place]) 08/06/2017  . Schizophrenia ([place])   . Seizures ([place])     Past Surgical History:  Procedure Laterality Date  . BIOPSY N/A 04/12/2014   Procedure: BIOPSY;  Surgeon: Danie Binder, MD;  Location: [facility];  Service: Endoscopy;  Laterality: N/A;  Gastric  . COLONOSCOPY WITH PROPOFOL N/A 04/12/2014   SLF:  1. one colon polyp removed 2. The left colon is extremely redundant 3. Small internal hemorrhoids  . ESOPHAGOGASTRODUODENOSCOPY (EGD) WITH PROPOFOL N/A 04/12/2014   SLF: Stricture at the gastroesophageal junction 2. small polyp  in teh gastric body 3. Mild Non-erosive gastritis.   Marland Kitchen NOSE SURGERY  1968   unsure what type of surgery. something to help him breathe better  . POLYPECTOMY N/A 04/12/2014   Procedure: POLYPECTOMY;  Surgeon: Danie Binder, MD;  Location: [facility];  Service: Endoscopy;  Laterality: N/A;  Cecal  . SAVORY DILATION N/A 04/12/2014   Procedure: SAVORY DILATION;  Surgeon: Danie Binder, MD;  Location: [facility];  Service: Endoscopy;  Laterality: N/A;  12.8/ 14/15/16  . SKIN BIOPSY       reports that he has never  smoked. He has never used smokeless tobacco. He reports that he does not drink alcohol or use drugs.  No Known Allergies  Family History  Problem Relation Age of Onset  . Other Mother        unknown medical history  . Other Father        unknown medical history  . Colon cancer Neg Hx      Prior to Admission medications   Medication Sig Start Date End Date Taking? Authorizing Provider  aspirin 81 MG chewable tablet Chew 81 mg by mouth daily.    [provider]  atorvastatin (LIPITOR) 20 MG tablet Take 20 mg by  mouth daily.    [provider]  benztropine (COGENTIN) 2 MG tablet Take 2 mg by mouth 2 (two) times daily.    [provider]  bictegravir-emtricitabine-tenofovir AF (BIKTARVY) 50-200-25 MG TABS tablet Take 1 tablet by mouth daily. 08/06/17   Truman Hayward, MD  cetirizine (ZYRTEC) 10 MG tablet TAKE 1 TABLET ONCE DAILY. 01/21/18   Forrest Moron, MD  clonazePAM (KLONOPIN) 2 MG tablet Take 0.5 tablets (1 mg total) by mouth at bedtime. 07/21/16   Rogue Bussing, MD  clozapine [facility]) 100 MG disintegrating tablet Take 50-100 mg by mouth See admin instructions. Take 1 tablet in the morning and take 1/2 tablet every evening    [provider]  folic acid (FOLVITE) 1 MG tablet Take 1 mg by mouth daily.    [provider]  gabapentin (NEURONTIN) 300 MG capsule Take 300 mg by mouth daily. With 400mg  capsule     [provider]  gabapentin (NEURONTIN) 400 MG capsule Take 400 mg by mouth 2 (two) times daily.     [provider]  haloperidol (HALDOL) 5 MG tablet Take 2 tablets (10 mg total) by mouth daily with supper. Patient taking differently: Take 5 mg by mouth daily with supper.  07/21/16   Rogue Bussing, MD  levETIRAcetam (KEPPRA) 500 MG tablet Take 1 tablet (500 mg total) by mouth 2 (two) times daily. 02/09/18   Marcial Pacas, MD  metFORMIN (GLUCOPHAGE) 500 MG tablet Take 500-1,000 mg by mouth See admin instructions. Take 2 tablets in the morning then take 1 tablet in the evening    [provider]  predniSONE (DELTASONE) 20 MG tablet Take 2 tablets daily with breakfast. 12/28/16   Jaynee Eagles, PA-C  ranitidine (ZANTAC) 150 MG tablet TAKE 1 TABLET BY MOUTH 12 HOURS AFTER TAKING REYATAZ. Patient taking differently: Take 150 mg by mouth 2 (two) times daily.  10/31/12   Truman Hayward, MD  senna (SENOKOT) 8.6 MG tablet Take 2 tablets by mouth 2 (two) times daily.    [provider]  terbinafine (LAMISIL) 250  MG tablet Take 250 mg by mouth daily.    [provider]  valproic acid (DEPAKENE) 250 MG capsule Take 2 capsules every morning then take 4 capsules at bedtime 02/09/18   Marcial Pacas, MD    Physical Exam: Vitals:   02/16/18 0230 02/16/18 0330 02/16/18 0400 02/16/18 0415  BP: (!) 139/92 (!) 147/107 138/75   Pulse: (!) 108   (!) 106  Resp: (!) 30 (!) 28  (!) 21  Temp:      TempSrc:      SpO2: 96%   95%    Constitutional: No acute respiratory distress, lethargic  Eyes: PERTLA, lids and conjunctivae normal ENMT: Mucous membranes are moist. Posterior pharynx clear of any exudate  or lesions.   Neck: normal, supple, no masses, no thyromegaly Respiratory: Mild tachypnea, no wheezing, no crackles. Normal respiratory effort.    Cardiovascular: S1 & S2 heard, regular rate and rhythm. No extremity edema.  Abdomen: mild distension, no tenderness, soft. Bowel sounds normal.  Musculoskeletal: no clubbing / cyanosis. No joint deformity upper and lower extremities.    Skin: no significant rashes, lesions, ulcers. Warm, dry, well-perfused. Neurologic: No facial asymmetry. Sensation intact. Moving all extremities.  Psychiatric: Lethargic. Answering basic questions appropriately.    Labs on Admission: I have personally reviewed following labs and imaging studies  CBC: Recent Labs  Lab 02/16/18 0045  WBC 12.5*  NEUTROABS 9.2*  HGB 13.2  HCT 44.9  MCV 94.9  PLT 509   Basic Metabolic Panel: Recent Labs  Lab 02/16/18 0045  NA 140  K 4.2  CL 103  CO2 24  GLUCOSE 183*  BUN 21*  CREATININE 2.05*  CALCIUM 9.0   GFR: Estimated Creatinine Clearance: 42.9 mL/min (A) (by C-G formula based on SCr of 2.05 mg/dL (H)). Liver Function Tests: Recent Labs  Lab 02/16/18 0045  AST 24  ALT 17  ALKPHOS 52  BILITOT 0.6  PROT 8.3*  ALBUMIN 3.9   No results for input(s): LIPASE, AMYLASE in the last 168 hours. Recent Labs  Lab 02/16/18 0045  AMMONIA 29   Coagulation Profile: No  results for input(s): INR, PROTIME in the last 168 hours. Cardiac Enzymes: No results for input(s): CKTOTAL, CKMB, CKMBINDEX, TROPONINI in the last 168 hours. BNP (last 3 results) No results for input(s): PROBNP in the last 8760 hours. HbA1C: No results for input(s): HGBA1C in the last 72 hours. CBG: No results for input(s): GLUCAP in the last 168 hours. Lipid Profile: No results for input(s): CHOL, HDL, LDLCALC, TRIG, CHOLHDL, LDLDIRECT in the last 72 hours. Thyroid Function Tests: No results for input(s): TSH, T4TOTAL, FREET4, T3FREE, THYROIDAB in the last 72 hours. Anemia Panel: No results for input(s): VITAMINB12, FOLATE, FERRITIN, TIBC, IRON, RETICCTPCT in the last 72 hours. Urine analysis:    Component Value Date/Time   COLORURINE AMBER (A) 02/16/2018 0237   APPEARANCEUR CLEAR 02/16/2018 0237   LABSPEC 1.017 02/16/2018 0237   PHURINE 5.0 02/16/2018 0237   GLUCOSEU NEGATIVE 02/16/2018 0237   HGBUR NEGATIVE 02/16/2018 0237   BILIRUBINUR NEGATIVE 02/16/2018 0237   KETONESUR 5 (A) 02/16/2018 0237   PROTEINUR NEGATIVE 02/16/2018 0237   UROBILINOGEN 0.2 06/20/2014 1525   NITRITE NEGATIVE 02/16/2018 0237   LEUKOCYTESUR NEGATIVE 02/16/2018 0237   Sepsis Labs: @LABRCNTIP (procalcitonin:4,lacticidven:4) )No results found for this or any previous visit (from the past 240 hour(s)).   Radiological Exams on Admission: Ct Head Wo Contrast  Result Date: 02/16/2018 CLINICAL DATA:  Initial evaluation for acute altered mental status. EXAM: CT HEAD WITHOUT CONTRAST TECHNIQUE: Contiguous axial images were obtained from the base of the skull through the vertex without intravenous contrast. COMPARISON:  Prior CT from 05/05/2011. FINDINGS: Brain: Mild age-related cerebral atrophy with chronic small vessel ischemic disease. No acute intracranial hemorrhage. No acute large vessel territory infarct. No mass lesion, midline shift or mass effect. No hydrocephalus. No extra-axial fluid collection.  Vascular: No hyperdense vessel. Skull: Scalp soft tissues and calvarium within normal limits. Sinuses/Orbits: Globes and orbital soft tissues demonstrate no acute abnormality. Chronic left sphenoid sinusitis noted. Paranasal sinuses are otherwise largely clear. Trace opacity right mastoid air cells, of doubtful significance. Mastoid air cells otherwise clear. Other: None. IMPRESSION: 1. No acute intracranial abnormality. 2. Mild age-related cerebral  atrophy with chronic small vessel ischemic disease. 3. Chronic left frontal sinusitis. Electronically Signed   By: Jeannine Boga M.D.   On: 02/16/2018 01:31   Dg Chest Port 1 View  Result Date: 02/16/2018 CLINICAL DATA:  Altered mental status for 1 day. EXAM: PORTABLE CHEST 1 VIEW COMPARISON:  November 01, 2017 FINDINGS: The cardiomediastinal silhouette is normal. Mild opacity in left base. No other acute abnormalities. No change in the cardiomediastinal silhouette. IMPRESSION: Mild opacity in the left base may represent atelectasis or early infiltrate. Recommend clinical correlation. Electronically Signed   By: Dorise Bullion III M.D   On: 02/16/2018 01:00    EKG: Independently reviewed. Sinus rhythm, inferior T-wave abnormality.   Assessment/Plan   1. Sepsis suspected secondary to PNA  - Presents with lethargy, found to have leukocytosis, tachycardia, tachypnea, and elevated lactate  - CXR with possible left basilar PNA; diarrhea also represents a potential source  - Blood cultures collected in ED, stool sample sent for C diff PCR and GI pathogen panel, 30 cc/kg NS was given, and he was treated with empiric Rocephin and azithromycin  - Continue current antibiotics while following cultures and clinical course   2. Diarrhea  - He has had multiple episodes of diarrhea in ED  - Sample sent for GI pathogen panel and C diff PCR - Continue enteric precautions, monitor electrolytes, continue IVF hydration   3. CKD III  - SCr is 2.05 on admission,  similar to priors  - Renally-dose medications, monitor    4. Bipolar disorder  - Continue Haldol, clozapine, cogentin, Klonopin   5. HIV  - CD4 was 750 and VL undetectable last month  - Continue Biktarvy    6. Seizure disorder - No seizure-like activity reported  - Continue Keppra and Depakote    7. Acute encephalopathy  - Patient is brought in for eval of gen weakness and not speaking  - No acute findings on head CT  - He has continued to improve in the ED and is answering questions appropriately by time of admission and likely at baseline cognitively based on review of chart notes; this was likely secondary to sepsis     8. Med rec  - Prednisone 40 mg qD is on med list since 08/2017 with unknown indication, unclear if still taking  - There is no sign of adrenal insufficiency on admission and this will be held pending confirmation with pharmacy    DVT prophylaxis: sq heparin  Code Status: Full  Family Communication: Discussed with patient  Consults called: None Admission status: Inpatient     Vianne Bulls, MD [facility] Pager [phone]  If 7PM-7AM, please contact night-coverage [email or web address] Password [facility]  02/16/2018, 4:29 AM

## 2018-02-16 NOTE — ED Notes (Signed)
Pt CBG 145. Notified Whitney, Therapist, sports.

## 2018-02-16 NOTE — ED Notes (Signed)
Pt had BM, cleaned pt, stool sample sent off

## 2018-02-16 NOTE — Progress Notes (Signed)
Pt temp 101.8 rectally. HR elevated in 120's, Respirations 24. Notified Bodenheimer, NP and awaiting new orders. Will continue to monitor.

## 2018-02-16 NOTE — ED Notes (Signed)
Pt CBG 201. Notified Whitney, Therapist, sports.

## 2018-02-16 NOTE — ED Triage Notes (Signed)
Pt has altered mental status x 1 day worsening this evening. Caregiver at bedside reports unable to communicate, he typically can communicate.

## 2018-02-16 NOTE — Progress Notes (Signed)
Nathan [name] is a 56 y.o. male patient admitted from ED awake, alert - oriented  To self  - no acute distress noted.  VSS - Blood pressure 119/68, pulse (!) 113, temperature 100 F (37.8 C), temperature source Oral, resp. rate 16, SpO2 95 %.    IV in place, occlusive dsg intact without redness.  Orientation to room, and floor completed with information packet given to patient/family.  Patient declined safety video at this time.  Admission INP armband ID verified with patient/family, and in place.   SR up x 2, fall assessment complete, with patient and family able to verbalize understanding of risk associated with falls, and verbalized understanding to call nsg before up out of bed.  Call light within reach, patient able to voice, and demonstrate understanding.  Skin, clean-dry- intact without evidence of bruising, or skin tears.   No evidence of skin break down noted on exam.     Will cont to eval and treat per MD orders.  Luci Bank, RN 02/16/2018 2:26 PM

## 2018-02-16 NOTE — ED Provider Notes (Addendum)
[facility] EMERGENCY DEPARTMENT Provider Note   CSN: [id number] Arrival date & time: 02/16/18  0023     History   Chief Complaint Chief Complaint  Patient presents with  . Altered Mental Status    HPI Nathan [name] is a 57 y.o. male.  Patient brought to the emergency department by caregiver.  She reports that the patient has been very weak today.  Over the course of the day his symptoms have worsened.  He has now got to the point where he cannot answer any questions, does not seem to know who is around him.  He is unable to sit up on his own, stand up or walk.  This is unusual for him, normally able to walk and interact with his caregivers. Level V Caveat due to mental status change.     Past Medical History:  Diagnosis Date  . Anemia   . Anemia   . Anxiety   . Bipolar 1 disorder ([place])   . Depression, major   . HIV (human immunodeficiency virus infection) ([place])   . Hyperglycemia 08/06/2017  . Hypernatremia   . Kidney failure   . Lithium toxicity   . MR (mental retardation)   . Schizoaffective disorder ([place]) 08/06/2017  . Schizophrenia ([place])   . Seizures [facility])     Patient Active Problem List   Diagnosis Date Noted  . Sepsis due to pneumonia ([place]) 02/16/2018  . Diarrhea 02/16/2018  . Gait abnormality 02/09/2018  . Hyperglycemia 08/06/2017  . Schizoaffective disorder ([place]) 08/06/2017  . Bipolar 1 disorder ([place])   . Schizophrenia ([place])   . Seizures ([place])   . Altered mental state 07/20/2016  . Aggressive behavior   . GERD (gastroesophageal reflux disease) 07/25/2014  . Acute encephalopathy 06/23/2014  . Hypernatremia 06/20/2014  . Dehydration 06/18/2014  . Lithium toxicity 06/18/2014  . ARF (acute renal failure) ([place]) 06/18/2014  . Acute urinary retention 06/18/2014  . CAP (community acquired pneumonia) 06/18/2014  . Encounter for screening colonoscopy 03/25/2014  . Dysphagia, pharyngoesophageal phase 03/25/2014  . CKD (chronic kidney  disease) stage 3, GFR 30-59 ml/min (HCC) 07/22/2012  . HIV disease ([place]) 06/19/2012  . Bipolar 1 disorder, mixed, moderate ([place]) 06/19/2012  . Hyponatremia 06/19/2012  . Mental retardation 06/19/2012    Past Surgical History:  Procedure Laterality Date  . BIOPSY N/A 04/12/2014   Procedure: BIOPSY;  Surgeon: Danie Binder, MD;  Location: [facility] OR[facility];  Service: Endoscopy;  Laterality: N/A;  Gastric  . COLONOSCOPY WITH PROPOFOL N/A 04/12/2014   SLF:  1. one colon polyp removed 2. The left colon is extremely redundant 3. Small internal hemorrhoids  . ESOPHAGOGASTRODUODENOSCOPY (EGD) WITH PROPOFOL N/A 04/12/2014   SLF: Stricture at the gastroesophageal junction 2. small polyp  in teh gastric body 3. Mild Non-erosive gastritis.   Marland Kitchen NOSE SURGERY  1968   unsure what type of surgery. something to help him breathe better  . POLYPECTOMY N/A 04/12/2014   Procedure: POLYPECTOMY;  Surgeon: Danie Binder, MD;  Location: [facility] ORS;  Service: Endoscopy;  Laterality: N/A;  Cecal  . SAVORY DILATION N/A 04/12/2014   Procedure: SAVORY DILATION;  Surgeon: Danie Binder, MD;  Location: [facility] ORS;  Service: Endoscopy;  Laterality: N/A;  12.8/ 14/15/16  . SKIN BIOPSY          Home Medications    Prior to Admission medications   Medication Sig Start Date End Date Taking? Authorizing Provider  aspirin 81 MG chewable tablet Chew 81 mg  by mouth daily.    [provider]  atorvastatin (LIPITOR) 20 MG tablet Take 20 mg by mouth daily.    [provider]  benztropine (COGENTIN) 2 MG tablet Take 2 mg by mouth 2 (two) times daily.    [provider]  bictegravir-emtricitabine-tenofovir AF (BIKTARVY) 50-200-25 MG TABS tablet Take 1 tablet by mouth daily. 08/06/17   Truman Hayward, MD  cetirizine (ZYRTEC) 10 MG tablet TAKE 1 TABLET ONCE DAILY. 01/21/18   Forrest Moron, MD  clonazePAM (KLONOPIN) 2 MG tablet Take 0.5 tablets (1 mg total) by mouth at bedtime. 07/21/16   Rogue Bussing, MD    clozapine [facility]) 100 MG disintegrating tablet Take 50-100 mg by mouth See admin instructions. Take 1 tablet in the morning and take 1/2 tablet every evening    [provider]  folic acid (FOLVITE) 1 MG tablet Take 1 mg by mouth daily.    [provider]  gabapentin (NEURONTIN) 300 MG capsule Take 300 mg by mouth daily. With 400mg  capsule     [provider]  gabapentin (NEURONTIN) 400 MG capsule Take 400 mg by mouth 2 (two) times daily.     [provider]  haloperidol (HALDOL) 5 MG tablet Take 2 tablets (10 mg total) by mouth daily with supper. Patient taking differently: Take 5 mg by mouth daily with supper.  07/21/16   Rogue Bussing, MD  levETIRAcetam (KEPPRA) 500 MG tablet Take 1 tablet (500 mg total) by mouth 2 (two) times daily. 02/09/18   Marcial Pacas, MD  metFORMIN (GLUCOPHAGE) 500 MG tablet Take 500-1,000 mg by mouth See admin instructions. Take 2 tablets in the morning then take 1 tablet in the evening    [provider]  predniSONE (DELTASONE) 20 MG tablet Take 2 tablets daily with breakfast. 12/28/16   Jaynee Eagles, PA-C  ranitidine (ZANTAC) 150 MG tablet TAKE 1 TABLET BY MOUTH 12 HOURS AFTER TAKING REYATAZ. Patient taking differently: Take 150 mg by mouth 2 (two) times daily.  10/31/12   Truman Hayward, MD  senna (SENOKOT) 8.6 MG tablet Take 2 tablets by mouth 2 (two) times daily.    [provider]  terbinafine (LAMISIL) 250 MG tablet Take 250 mg by mouth daily.    [provider]  valproic acid (DEPAKENE) 250 MG capsule Take 2 capsules every morning then take 4 capsules at bedtime 02/09/18   Marcial Pacas, MD    Family History Family History  Problem Relation Age of Onset  . Other Mother        unknown medical history  . Other Father        unknown medical history  . Colon cancer Neg Hx     Social History Social History   Tobacco Use  . Smoking status: Never Smoker  . Smokeless tobacco: Never Used   Substance Use Topics  . Alcohol use: No    Alcohol/week: 0.0 standard drinks  . Drug use: No     Allergies   Patient has no known allergies.   Review of Systems Review of Systems  Unable to perform ROS: Mental status change     Physical Exam Updated Vital Signs BP (!) 139/92   Pulse (!) 108   Temp 99.9 F (37.7 C) (Rectal)   Resp (!) 30   SpO2 96%   Physical Exam  Constitutional: He appears well-developed and well-nourished. No distress.  HENT:  Head: Normocephalic and atraumatic.  Right Ear: Hearing normal.  Left Ear: Hearing normal.  Nose: Nose normal.  Mouth/Throat: Oropharynx is clear and moist. Mucous membranes are dry.  Eyes: Pupils are equal, round, and reactive to light. Conjunctivae and EOM are normal.  Neck: Normal range of motion. Neck supple.  Cardiovascular: Regular rhythm, S1 normal and S2 normal. Exam reveals no gallop and no friction rub.  No murmur heard. Pulmonary/Chest: Effort normal and breath sounds normal. No respiratory distress. He exhibits no tenderness.  Abdominal: Soft. Normal appearance and bowel sounds are normal. There is no hepatosplenomegaly. There is no tenderness. There is no rebound, no guarding, no tenderness at McBurney's point and negative Murphy's sign. No hernia.  Musculoskeletal: Normal range of motion.  Neurological: He is alert. He has normal strength. No cranial nerve deficit or sensory deficit. Coordination normal. GCS eye subscore is 4. GCS verbal subscore is 1. GCS motor subscore is 5.  Skin: Skin is warm, dry and intact. No rash noted. No cyanosis.  Psychiatric: He has a normal mood and affect. His speech is normal and behavior is normal. Thought content normal.  Nursing note and vitals reviewed.    ED Treatments / Results  Labs (all labs ordered are listed, but only abnormal results are displayed) Labs Reviewed  CBC WITH DIFFERENTIAL/PLATELET - Abnormal; Notable for the following components:      Result Value    WBC 12.5 (*)    MCHC 29.4 (*)    Neutro Abs 9.2 (*)    Monocytes Absolute 1.9 (*)    All other components within normal limits  COMPREHENSIVE METABOLIC PANEL - Abnormal; Notable for the following components:   Glucose, Bld 183 (*)    BUN 21 (*)    Creatinine, Ser 2.05 (*)    Total Protein 8.3 (*)    GFR calc non Af Amer 34 (*)    GFR calc Af Amer 39 (*)    All other components within normal limits  URINALYSIS, ROUTINE W REFLEX MICROSCOPIC - Abnormal; Notable for the following components:   Color, Urine AMBER (*)    Ketones, ur 5 (*)    All other components within normal limits  I-STAT CG4 LACTIC ACID, ED - Abnormal; Notable for the following components:   Lactic Acid, Venous 5.65 (*)    All other components within normal limits  I-STAT CG4 LACTIC ACID, ED - Abnormal; Notable for the following components:   Lactic Acid, Venous 4.70 (*)    All other components within normal limits  I-STAT CG4 LACTIC ACID, ED - Abnormal; Notable for the following components:   Lactic Acid, Venous 4.16 (*)    All other components within normal limits  CULTURE, BLOOD (ROUTINE X 2)  CULTURE, BLOOD (ROUTINE X 2)  URINE CULTURE  C DIFFICILE QUICK SCREEN W PCR REFLEX  GASTROINTESTINAL PANEL BY PCR, STOOL (REPLACES STOOL CULTURE)  AMMONIA  RAPID URINE DRUG SCREEN, HOSP PERFORMED  I-STAT TROPONIN, ED    EKG EKG Interpretation  Date/Time:  Monday February 16 2018 02:45:04 EST Ventricular Rate:  103 PR Interval:    QRS Duration: 95 QT Interval:  339 QTC Calculation: 444 R Axis:   76 Text Interpretation:  Normal sinus rhythm Borderline T abnormalities, inferior leads No significant change since last tracing Confirmed by Orpah Greek [phone]) on 02/16/2018 2:50:31 AM   Radiology Ct Head Wo Contrast  Result Date: 02/16/2018 CLINICAL DATA:  Initial evaluation for acute altered mental status. EXAM: CT HEAD WITHOUT CONTRAST TECHNIQUE: Contiguous axial images were obtained from the base of  the skull through the vertex without intravenous contrast. COMPARISON:  Prior CT from 05/05/2011. FINDINGS: Brain: Mild age-related cerebral atrophy with chronic small vessel ischemic disease. No acute intracranial hemorrhage. No acute large vessel territory infarct. No mass lesion, midline shift or mass effect. No hydrocephalus. No extra-axial fluid collection. Vascular: No hyperdense vessel. Skull: Scalp soft tissues and calvarium within normal limits. Sinuses/Orbits: Globes and orbital soft tissues demonstrate no acute abnormality. Chronic left sphenoid sinusitis noted. Paranasal sinuses are otherwise largely clear. Trace opacity right mastoid air cells, of doubtful significance. Mastoid air cells otherwise clear. Other: None. IMPRESSION: 1. No acute intracranial abnormality. 2. Mild age-related cerebral atrophy with chronic small vessel ischemic disease. 3. Chronic left frontal sinusitis. Electronically Signed   By: Jeannine Boga M.D.   On: 02/16/2018 01:31   Dg Chest Port 1 View  Result Date: 02/16/2018 CLINICAL DATA:  Altered mental status for 1 day. EXAM: PORTABLE CHEST 1 VIEW COMPARISON:  November 01, 2017 FINDINGS: The cardiomediastinal silhouette is normal. Mild opacity in left base. No other acute abnormalities. No change in the cardiomediastinal silhouette. IMPRESSION: Mild opacity in the left base may represent atelectasis or early infiltrate. Recommend clinical correlation. Electronically Signed   By: Dorise Bullion III M.D   On: 02/16/2018 01:00    Procedures Procedures (including critical care time)  Medications Ordered in ED Medications  cefTRIAXone (ROCEPHIN) 2 g in sodium chloride 0.9 % 100 mL IVPB (0 g Intravenous Stopped 02/16/18 0335)  azithromycin (ZITHROMAX) 500 mg in sodium chloride 0.9 % 250 mL IVPB (0 mg Intravenous Stopped 02/16/18 0335)  sodium chloride 0.9 % bolus 1,000 mL (0 mLs Intravenous Stopped 02/16/18 0335)    And  sodium chloride 0.9 % bolus 1,000 mL (0 mLs  Intravenous Stopped 02/16/18 0335)    And  sodium chloride 0.9 % bolus 1,000 mL (1,000 mLs Intravenous New Bag/Given 02/16/18 0230)     Initial Impression / Assessment and Plan / ED Course  I have reviewed the triage vital signs and the nursing notes.  Pertinent labs & imaging results that were available during my care of the patient were reviewed by me and considered in my medical decision making (see chart for details).     Patient who resides in a group home presents to the ER for mental status changes.  Patient has a history of MR, schizophrenia and is HIV positive.  Patient had undetectable viral load on October 10 of this year.    He reportedly became much less active over the course of the day, noted to become very weak and now cannot sit up or stand on his own.  Patient does not interact or answer questions currently, normally will be able to answer questions and converse.  This is a significant change for him.  Head CT unremarkable.  Chest x-ray shows opacity at the left base that could be developing pneumonia.  Patient's rectal temp was 99.9 at arrival.  He does, however, have mild tachycardia, tachypnea, leukocytosis. Lactic acid is also markedly elevated.  He does have a seizure history, but no witnessed seizures today.  Repeat examinations of his abdomen did not elicit any pain or tenderness with palpation, doubt intra-abdominal pathology at this time.  Patient administered IV fluids, lactic acid is still very elevated but is slowly clearing.  Patient is becoming more awake and alert.  Based on this, doubt that he has meningitis or other CNS infection.  He has not limited any significant focal neurologic symptoms and his weakness  is improving.  He has now started having large volume, foul-smelling diarrhea here in the ER.  He has had multiple episodes.  C. difficile and gastrointestinal pathogen panel ordered.  CRITICAL CARE Performed by: Orpah Greek   Total critical  care time: 35 minutes  Critical care time was exclusive of separately billable procedures and treating other patients.  Critical care was necessary to treat or prevent imminent or life-threatening deterioration.  Critical care was time spent personally by me on the following activities: development of treatment plan with patient and/or surrogate as well as nursing, discussions with consultants, evaluation of patient's response to treatment, examination of patient, obtaining history from patient or surrogate, ordering and performing treatments and interventions, ordering and review of laboratory studies, ordering and review of radiographic studies, pulse oximetry and re-evaluation of patient's condition.   Final Clinical Impressions(s) / ED Diagnoses   Final diagnoses:  Encephalopathy acute  Community acquired pneumonia of left lower lobe of lung [facility])    ED Discharge Orders    None       Pretty Weltman, Gwenyth Allegra, MD 02/16/18 0350    Orpah Greek, MD 02/16/18 0403

## 2018-02-16 NOTE — ED Notes (Signed)
Ordered bfast tray 

## 2018-02-16 NOTE — ED Notes (Signed)
Pt informed of lactic acid results 5.65

## 2018-02-16 NOTE — Progress Notes (Signed)
57 year old male group home resident at [facility] Known HIV AIDS on [place] seen in office 01/15/2018-CD4 750 viral load undetectable Seizure with tonic-clonic movements-on Keppra and valproic acid Known history of chronic dysarthria  Came to ED with lethargy and less vocalization found to have T-max 37.7 tachypneic to 30s EKG showed sinus rhythm with inferior T wave abnormality creatinine stable 2.0 leukocytosis 12,000 lactic acid 5.6 in the ED had multiple loose bowel movements Patient being treated for sepsis likely secondary to diarrhea versus pneumonia-CXR showed left basilar pneumonia GI pathogen panel C. difficile are pending   Metabolic encephalopathy likely infectious?  Aspiration versus diarrheal as below-no signs or symptoms of seizures but will monitor would hold on work-up of the same at this time  Sepsis-continue azithromycin, ceftriaxone-saline 100 cc/h-repeat labs in a.m.-checking for diarrheal illness such as C. difficile etc.-labs are still currently pending we will continue the current pneumonia coverage --can continue heart healthy diet provided no overt signs of aspiration otherwise we will need to work this up  HIV continue Biktarvy 1 tablet daily-last counts were well-controlled  Bipolar continue Klonopin 1 at bedtime, clozapine 100 has been cut back to 25 daily, Haldol 5 at bedtime, Cogentin has been cut back from 2 to 1 mg twice daily as per instructions-we can titrate medications back to usual home doses-nursing has been informed to let me know if his tachycardia resolves with reinitiation of meds may be patient can go to telemetry  Diabetes mellitus with nephropathy and neuropathy continue gabapentin lower dose of 300 twice daily-sliding scale coverage  Seizure disorder continue Keppra 500 twice daily, Depakote 500 daily and 1000 at bedtime  Verneita Griffes, MD [facility] 8:34 AM

## 2018-02-16 NOTE — Progress Notes (Signed)
Stool EAEC ecoli Narrow to ciprofloxacin for now  Verneita Griffes, MD [facility] 3:24 PM

## 2018-02-16 NOTE — ED Notes (Signed)
Blood cultures drawn prior to starting antibiotics

## 2018-02-16 NOTE — ED Notes (Signed)
Dr Betsey Holiday informed of lactic acid results 4.70

## 2018-02-17 ENCOUNTER — Inpatient Hospital Stay (HOSPITAL_COMMUNITY): Payer: Medicare Other

## 2018-02-17 LAB — BASIC METABOLIC PANEL
Anion gap: 3 — ABNORMAL LOW (ref 5–15)
BUN: 22 mg/dL — AB (ref 6–20)
CO2: 28 mmol/L (ref 22–32)
CREATININE: 1.8 mg/dL — AB (ref 0.61–1.24)
Calcium: 8.7 mg/dL — ABNORMAL LOW (ref 8.9–10.3)
Chloride: 116 mmol/L — ABNORMAL HIGH (ref 98–111)
GFR, EST AFRICAN AMERICAN: 46 mL/min — AB (ref >60)
GFR, EST NON AFRICAN AMERICAN: 40 mL/min — AB (ref >60)
Glucose, Bld: 125 mg/dL — ABNORMAL HIGH (ref 70–99)
Potassium: 3.6 mmol/L (ref 3.5–5.1)
SODIUM: 147 mmol/L — AB (ref 135–145)

## 2018-02-17 LAB — GLUCOSE, CAPILLARY
GLUCOSE-CAPILLARY: 111 mg/dL — AB (ref 70–99)
GLUCOSE-CAPILLARY: 123 mg/dL — AB (ref 70–99)
GLUCOSE-CAPILLARY: 156 mg/dL — AB (ref 70–99)
Glucose-Capillary: 139 mg/dL — ABNORMAL HIGH (ref 70–99)
Glucose-Capillary: 109 mg/dL — ABNORMAL HIGH (ref 70–99)

## 2018-02-17 LAB — CBC WITH DIFFERENTIAL/PLATELET
Abs Immature Granulocytes: 0.22 10*3/uL — ABNORMAL HIGH (ref 0.00–0.07)
BASOS ABS: 0.1 10*3/uL (ref 0.0–0.1)
Basophils Relative: 1 %
EOS ABS: 0 10*3/uL (ref 0.0–0.5)
EOS PCT: 0 %
HCT: 37.6 % — ABNORMAL LOW (ref 39.0–52.0)
Hemoglobin: 11.6 g/dL — ABNORMAL LOW (ref 13.0–17.0)
Immature Granulocytes: 2 %
LYMPHS ABS: 1.6 10*3/uL (ref 0.7–4.0)
LYMPHS PCT: 15 %
MCH: 28.6 pg (ref 26.0–34.0)
MCHC: 30.9 g/dL (ref 30.0–36.0)
MCV: 92.8 fL (ref 80.0–100.0)
MONO ABS: 1.6 10*3/uL — AB (ref 0.1–1.0)
MONOS PCT: 15 %
NRBC: 0 % (ref 0.0–0.2)
Neutro Abs: 7.1 10*3/uL (ref 1.7–7.7)
Neutrophils Relative %: 67 %
PLATELETS: UNDETERMINED 10*3/uL (ref 150–400)
RBC: 4.05 MIL/uL — ABNORMAL LOW (ref 4.22–5.81)
RDW: 14.7 % (ref 11.5–15.5)
WBC: 10.6 10*3/uL — ABNORMAL HIGH (ref 4.0–10.5)

## 2018-02-17 LAB — LACTIC ACID, PLASMA: LACTIC ACID, VENOUS: 2.4 mmol/L — AB (ref 0.5–1.9)

## 2018-02-17 LAB — URINE CULTURE: CULTURE: NO GROWTH

## 2018-02-17 MED ORDER — SODIUM CHLORIDE 0.9 % IV SOLN
500.0000 mg | INTRAVENOUS | Status: DC
  Administered 2018-02-17: 500 mg via INTRAVENOUS
  Filled 2018-02-17 (×2): qty 500

## 2018-02-17 MED ORDER — SODIUM CHLORIDE 0.9 % IV SOLN
1.0000 g | INTRAVENOUS | Status: DC
  Administered 2018-02-17: 1 g via INTRAVENOUS
  Filled 2018-02-17: qty 10

## 2018-02-17 NOTE — Progress Notes (Signed)
Placed pt on cooling blanket per MD order. Pt's rectal temp is 101.4. Will continue to monitor pt. Hassan Buckler

## 2018-02-17 NOTE — Progress Notes (Signed)
CRITICAL VALUE ALERT  Critical Value:  Lactic acid 2.4  Date & Time Notied:  02/17/18 at 1125  Provider Notified: Dr. Verlon Au  Orders Received/Actions taken: None at this time, awaiting response.

## 2018-02-17 NOTE — Progress Notes (Signed)
[facility] PROGRESS NOTE  Nathan [name] [id number] DOB: 1960-12-21 DOA: 02/16/2018 PCP: Javier Docker, MD   Narrative: 57 year old male group home resident at [facility] Known HIV AIDS on [place] seen in office 01/15/2018-CD4 750 viral load undetectable Seizure with tonic-clonic movements-on Keppra and valproic acid Known history of chronic dysarthria   Came to ED with lethargy and less vocalization found to have T-max 37.7 tachypneic to 30s EKG showed sinus rhythm with inferior T wave abnormality creatinine stable 2.0 leukocytosis 12,000 lactic acid 5.6 in the ED had multiple loose bowel movements Patient being treated for sepsis likely secondary to diarrhea versus pneumonia-CXR showed left basilar pneumonia GI pathogen panel C. difficile are pending   A & Plan Metabolic encephalopathy likely infectious-Gi path panel showing EAEC was narrowed to ciprofloxacin but unfortunately has spiked temperatures  --Education officer, museum who knows him well tells me she was called because of seizure-like activity at facility and although he has not had seizures here he has fevers -Currently on Cipro but resumed azithromycin, ceftriaxone-saline 100 cc/h-has chest x-ray suggestive of bibasilar infiltrates versus atelectasis --can continue heart healthy diet provided no overt signs of aspiration  HIV continue Biktarvy 1 tablet daily-last counts were well-controlled   Bipolar continue Klonopin 1 at bedtime, clozapine 100 has been cut back to 25 daily, Haldol 5 at bedtime, Cogentin has been cut back from 2 to 1 mg twice daily as per instructions-we will hold on re-titration of the same as an outpatient and reassess in the next couple of days   Diabetes mellitus with nephropathy and neuropathy continue gabapentin lower dose of 300 twice daily-sliding scale coverage-greater than the range of 111-123   Seizure disorder continue Keppra 500 twice daily, Depakote 500 daily and 1000 at  bedtime If seizure activity or concerns for the same we will need to dose meds    DVT prophylaxis: Heparin every 8 code Status: Full code family Communication: Discussed with social work at the bedside-at baseline he is usually able to ambulate and verbalizes fairly well and is able to communicate his needs disposition Plan: At this time treating broadly for sepsis   Verlon Au, MD  [facility] Direct contact: [phone] --[place]ia [place]  --[email or web address]; password [email or web address]  7PM-7AM contact night coverage as above 02/17/2018, 10:08 AM  LOS: 1 day   Consultants:  no  Procedures:  no  Antimicrobials:  Currently ciprofloxacin twice daily  Interval history/Subjective: Awake alert pleasant no distress but somewhat less coherent than his baseline-social work in the room tells me that the patient usually is able to do much more than he currently is able to he was on the blankets earlier today but his temperature is now better He is somewhat coherent but not at his baseline as above  Objective:  Vitals:  Vitals:   02/17/18 0603 02/17/18 0909  BP: 132/76 (!) 146/83  Pulse:  (!) 118  Resp: (!) 29 (!) 26  Temp:  99.3 F (37.4 C)  SpO2: 100% 100%    Exam:  Awake coherent to some degree but cannot tell me the date which is normal for him he has slightly slurred speech He has no overt distress Chest is clinically clear without added sound Abdomen is obese distended to some degree No lower extremity edema Neurologically intact moving all 4 limbs  I have personally reviewed the following:   Labs:  Labs are pending  Imaging studies:  Chest x-ray 11/12 shows concerns for low lung volumes or bibasilar infiltrates no  significant changes  Medical tests:  No  Test discussed with performing physician:  No  Decision to obtain old records:  No  Review and summation of old records:  Yes  Scheduled Meds: . aspirin  81 mg Oral Daily  . atorvastatin  20 mg Oral  q1800  . benztropine  1 mg Oral BID  . bictegravir-emtricitabine-tenofovir AF  1 tablet Oral Daily  . Chlorhexidine Gluconate Cloth  6 each Topical Q0600  . ciprofloxacin  500 mg Oral BID  . clonazePAM  1 mg Oral QHS  . cloZAPine  25 mg Oral Daily  . famotidine  20 mg Oral BID  . folic acid  1 mg Oral Daily  . gabapentin  300 mg Oral BID  . haloperidol  5 mg Oral Q supper  . heparin  5,000 Units Subcutaneous Q8H  . insulin aspart  0-5 Units Subcutaneous QHS  . insulin aspart  0-9 Units Subcutaneous TID WC  . levETIRAcetam  500 mg Oral BID  . mupirocin ointment  1 application Nasal BID  . sodium chloride flush  3 mL Intravenous Q12H  . valproic acid  500 mg Oral Daily   And  . valproic acid  1,000 mg Oral QHS   Continuous Infusions:  Principal Problem:   Sepsis due to pneumonia ([place]) Active Problems:   HIV disease (HCC)   Bipolar 1 disorder, mixed, moderate (HCC)   CKD (chronic kidney disease) stage 3, GFR 30-59 ml/min (HCC)   Acute encephalopathy   Diarrhea   LOS: 1 day

## 2018-02-18 ENCOUNTER — Inpatient Hospital Stay (HOSPITAL_COMMUNITY): Payer: Medicare Other

## 2018-02-18 DIAGNOSIS — J189 Pneumonia, unspecified organism: Secondary | ICD-10-CM

## 2018-02-18 DIAGNOSIS — A419 Sepsis, unspecified organism: Principal | ICD-10-CM

## 2018-02-18 DIAGNOSIS — G9340 Encephalopathy, unspecified: Secondary | ICD-10-CM

## 2018-02-18 LAB — COMPREHENSIVE METABOLIC PANEL
ALT: 19 U/L (ref 0–44)
ANION GAP: 5 (ref 5–15)
AST: 25 U/L (ref 15–41)
Albumin: 2.7 g/dL — ABNORMAL LOW (ref 3.5–5.0)
Alkaline Phosphatase: 38 U/L (ref 38–126)
BILIRUBIN TOTAL: 0.5 mg/dL (ref 0.3–1.2)
BUN: 20 mg/dL (ref 6–20)
CALCIUM: 8.7 mg/dL — AB (ref 8.9–10.3)
CO2: 26 mmol/L (ref 22–32)
CREATININE: 1.8 mg/dL — AB (ref 0.61–1.24)
Chloride: 116 mmol/L — ABNORMAL HIGH (ref 98–111)
GFR, EST AFRICAN AMERICAN: 46 mL/min — AB (ref >60)
GFR, EST NON AFRICAN AMERICAN: 40 mL/min — AB (ref >60)
Glucose, Bld: 128 mg/dL — ABNORMAL HIGH (ref 70–99)
Potassium: 3.6 mmol/L (ref 3.5–5.1)
Sodium: 147 mmol/L — ABNORMAL HIGH (ref 135–145)
TOTAL PROTEIN: 6.5 g/dL (ref 6.5–8.1)

## 2018-02-18 LAB — GLUCOSE, CAPILLARY
GLUCOSE-CAPILLARY: 108 mg/dL — AB (ref 70–99)
Glucose-Capillary: 145 mg/dL — ABNORMAL HIGH (ref 70–99)
Glucose-Capillary: 124 mg/dL — ABNORMAL HIGH (ref 70–99)
Glucose-Capillary: 145 mg/dL — ABNORMAL HIGH (ref 70–99)

## 2018-02-18 LAB — CBC WITH DIFFERENTIAL/PLATELET
Abs Immature Granulocytes: 0.04 10*3/uL (ref 0.00–0.07)
BASOS ABS: 0.1 10*3/uL (ref 0.0–0.1)
BASOS PCT: 1 %
EOS ABS: 0.1 10*3/uL (ref 0.0–0.5)
EOS PCT: 1 %
HCT: 38.8 % — ABNORMAL LOW (ref 39.0–52.0)
Hemoglobin: 11.5 g/dL — ABNORMAL LOW (ref 13.0–17.0)
Immature Granulocytes: 0 %
Lymphocytes Relative: 21 %
Lymphs Abs: 2.1 10*3/uL (ref 0.7–4.0)
MCH: 27.6 pg (ref 26.0–34.0)
MCHC: 29.6 g/dL — AB (ref 30.0–36.0)
MCV: 93 fL (ref 80.0–100.0)
Monocytes Absolute: 1.3 10*3/uL — ABNORMAL HIGH (ref 0.1–1.0)
Monocytes Relative: 13 %
NEUTROS PCT: 64 %
NRBC: 0 % (ref 0.0–0.2)
Neutro Abs: 6.4 10*3/uL (ref 1.7–7.7)
PLATELETS: 120 10*3/uL — AB (ref 150–400)
RBC: 4.17 MIL/uL — ABNORMAL LOW (ref 4.22–5.81)
RDW: 14.7 % (ref 11.5–15.5)
WBC: 10 10*3/uL (ref 4.0–10.5)

## 2018-02-18 LAB — LACTIC ACID, PLASMA
LACTIC ACID, VENOUS: 1.2 mmol/L (ref 0.5–1.9)
Lactic Acid, Venous: 1.6 mmol/L (ref 0.5–1.9)

## 2018-02-18 LAB — MAGNESIUM: MAGNESIUM: 2.5 mg/dL — AB (ref 1.7–2.4)

## 2018-02-18 LAB — PROCALCITONIN: Procalcitonin: 3.97 ng/mL

## 2018-02-18 MED ORDER — CLONAZEPAM 0.5 MG PO TABS
0.5000 mg | ORAL_TABLET | Freq: Every day | ORAL | Status: DC
  Administered 2018-02-19 – 2018-02-23 (×5): 0.5 mg via ORAL
  Filled 2018-02-18 (×5): qty 1

## 2018-02-18 MED ORDER — LEVETIRACETAM IN NACL 500 MG/100ML IV SOLN
500.0000 mg | Freq: Two times a day (BID) | INTRAVENOUS | Status: DC
  Administered 2018-02-18 – 2018-02-23 (×11): 500 mg via INTRAVENOUS
  Filled 2018-02-18 (×11): qty 100

## 2018-02-18 MED ORDER — SODIUM CHLORIDE 0.9 % IV BOLUS
1000.0000 mL | Freq: Once | INTRAVENOUS | Status: AC
  Administered 2018-02-18: 1000 mL via INTRAVENOUS

## 2018-02-18 MED ORDER — SODIUM CHLORIDE 0.9 % IV SOLN
8.0000 mg | INTRAVENOUS | Status: AC
  Administered 2018-02-18: 8 mg via INTRAVENOUS
  Filled 2018-02-18: qty 4

## 2018-02-18 MED ORDER — POTASSIUM CHLORIDE 2 MEQ/ML IV SOLN
INTRAVENOUS | Status: DC
  Administered 2018-02-18 – 2018-02-19 (×2): via INTRAVENOUS
  Filled 2018-02-18 (×4): qty 1000

## 2018-02-18 MED ORDER — LACTATED RINGERS IV SOLN: INTRAVENOUS | Status: DC

## 2018-02-18 MED ORDER — VALPROATE SODIUM 500 MG/5ML IV SOLN
1000.0000 mg | Freq: Every day | INTRAVENOUS | Status: DC
  Administered 2018-02-18 – 2018-02-23 (×5): 1000 mg via INTRAVENOUS
  Filled 2018-02-18 (×6): qty 10

## 2018-02-18 MED ORDER — VALPROATE SODIUM 500 MG/5ML IV SOLN
500.0000 mg | Freq: Every day | INTRAVENOUS | Status: DC
  Administered 2018-02-18 – 2018-02-23 (×6): 500 mg via INTRAVENOUS
  Filled 2018-02-18 (×6): qty 5

## 2018-02-18 NOTE — Progress Notes (Signed)
Pt pulled out NG tube that was at [facility]. Pt has pulled out 3 NG tubes total today. Pt had green safety mittens on and removed them. Pt has only had 50cc of brown drainage from NG tube today. Notified Schorr, NP concerning matter. Schorr, NP entered order to page surgery to see if tube needed to be reinserted. Dr. Hulen Skains, surgeon oncall stated to leave NG tube out unless pt vomited and then NG tube would need to be reinserted. Will continue to monitor pt. Ranelle Oyster, RN

## 2018-02-18 NOTE — Progress Notes (Addendum)
Completed in and out cath per V.O. From Dr. Candiss Norse Output was 1174ml

## 2018-02-18 NOTE — Clinical Social Work Note (Signed)
Clinical Social Work Assessment  Patient Details  Name: Nathan [name] MRN: [id number] Date of Birth: 1960/04/20  Date of referral:  02/18/18               Reason for consult:  Facility Placement                Permission sought to share information with:  Facility Sport and exercise psychologist, Guardian Permission granted to share information::  No  Name::     [place]::  [facility]  Relationship::  Legal Guardian  Contact Information:  [phone]  Housing/Transportation Living arrangements for the past 2 months:  Group Home Source of Information:  Guardian Patient Interpreter Needed:  None Criminal Activity/Legal Involvement Pertinent to Current Situation/Hospitalization:  No - Comment as needed Significant Relationships:  Other(Comment) Lives with:  Facility Resident Do you feel safe going back to the place where you live?  Yes Need for family participation in patient care:  Yes (Comment)  Care giving concerns:  CSW received consult regarding discharge planning. Patient's legal guardian spoke with CSW. She reported that patient has been residing at [facility] group home and will return at discharge unless patient's physical needs change. CSW to continue to follow and assist with discharge planning needs.   Social Worker assessment / plan:  CSW spoke with patient's guardian concerning discharge plan.   Employment status:  Disabled (Comment on whether or not currently receiving Disability) Insurance information:  Medicare, Medicaid [place] PT Recommendations:  Not assessed at this time Information / Referral to community resources:     Patient/Family's Response to care:  Patient's guardian requested medial records from the Emergency Department. CSW explained that she must go through medical records to get them, per hospital policy. She provided contact info for consent to procedures form and CSW placed them on the shadow chart.    Patient/Family's Understanding of and Emotional Response to Diagnosis, Current Treatment, and Prognosis:  Patient/family is realistic regarding therapy needs and expressed being hopeful for patient recovery. Patient's guardian expressed understanding of CSW role and discharge process as well as medical condition. No questions/concerns about plan or treatment.    Emotional Assessment Appearance:  Appears older than stated age Attitude/Demeanor/Rapport:  Unable to Assess Affect (typically observed):  Unable to Assess Orientation:  Oriented to Self(Has MR, bipolar disorder) Alcohol / Substance use:  Not Applicable Psych involvement (Current and /or in the community):  No (Comment)  Discharge Needs  Concerns to be addressed:  Care Coordination Readmission within the last 30 days:  No Current discharge risk:  None Barriers to Discharge:  Continued Medical Work up   [employer], LCSW 02/18/2018, 10:43 AM

## 2018-02-18 NOTE — Procedures (Signed)
ELECTROENCEPHALOGRAM REPORT   Patient: Nathan [name]       Room #: 0X73Z EEG No. ID: [id number] Age: 57 y.o.        Sex: male Referring Physician: Candiss Norse Report Date:  02/18/2018        Interpreting Physician: Alexis Goodell  History: TYKEL BADIE is an 57 y.o. male with lethargy and a history of seizures  Medications:  ASA, Lipitor, Cogentin, Klonopin, Pepcid, Folvite, Haldol, Insulin, Keppra, Depacon   Conditions of Recording:  This is a 21 channel routine scalp EEG performed with bipolar and monopolar montages arranged in accordance to the international 10/20 system of electrode placement. One channel was dedicated to EKG recording.  The patient is in the lethargic state.  Description:  The background activity is slow and poorly organized.  It consists of a low voltage mixture of theta and beta activity.  This activity is continuous and diffusely distributed.  There is no evidence of stage II sleep.  The patient does not appear to be fully awake during the recording for wakefulness to be evaluated.   No epileptiform activity is noted.  Hyperventilation and intermittent photic stimulation were not performed.   IMPRESSION: This EEG is characterized by slowing which is consistent with normal drowse.  Can not rule out the possibility of slowing related to general cerebral disturbance such as a metabolic encephalopathy.  Clinical correlation recommended.  No epileptiform activity is noted.       Alexis Goodell, MD Neurology [phone] 02/18/2018, 7:51 PM

## 2018-02-18 NOTE — Plan of Care (Signed)
  Problem: Education: Goal: Knowledge of General Education information will improve Description Including pain rating scale, medication(s)/side effects and non-pharmacologic comfort measures 02/18/2018 1226 by Salley Slaughter, RN Outcome: Not Progressing 02/18/2018 1225 by Salley Slaughter, RN Outcome: Not Progressing   Problem: Health Behavior/Discharge Planning: Goal: Ability to manage health-related needs will improve 02/18/2018 1226 by Salley Slaughter, RN Outcome: Not Progressing 02/18/2018 1225 by Salley Slaughter, RN Outcome: Not Progressing   Problem: Clinical Measurements: Goal: Ability to maintain clinical measurements within normal limits will improve 02/18/2018 1226 by Salley Slaughter, RN Outcome: Not Progressing 02/18/2018 1225 by Salley Slaughter, RN Outcome: Not Progressing Goal: Will remain free from infection 02/18/2018 1226 by Salley Slaughter, RN Outcome: Not Progressing 02/18/2018 1225 by Salley Slaughter, RN Outcome: Not Progressing Goal: Diagnostic test results will improve 02/18/2018 1226 by Salley Slaughter, RN Outcome: Not Progressing 02/18/2018 1225 by Salley Slaughter, RN Outcome: Not Progressing Goal: Respiratory complications will improve 02/18/2018 1226 by Salley Slaughter, RN Outcome: Not Progressing 02/18/2018 1225 by Salley Slaughter, RN Outcome: Not Progressing Goal: Cardiovascular complication will be avoided 02/18/2018 1226 by Salley Slaughter, RN Outcome: Not Progressing 02/18/2018 1225 by Salley Slaughter, RN Outcome: Not Progressing   Problem: Clinical Measurements: Goal: Will remain free from infection 02/18/2018 1226 by Salley Slaughter, RN Outcome: Not Progressing 02/18/2018 1225 by Salley Slaughter, RN Outcome: Not Progressing   Problem: Clinical Measurements: Goal: Diagnostic test results will improve 02/18/2018 1226 by Salley Slaughter, RN Outcome: Not Progressing 02/18/2018 1225 by Salley Slaughter, RN Outcome: Not Progressing   Problem: Clinical  Measurements: Goal: Respiratory complications will improve 02/18/2018 1226 by Salley Slaughter, RN Outcome: Not Progressing 02/18/2018 1225 by Salley Slaughter, RN Outcome: Not Progressing   Problem: Clinical Measurements: Goal: Cardiovascular complication will be avoided 02/18/2018 1226 by Salley Slaughter, RN Outcome: Not Progressing 02/18/2018 1225 by Salley Slaughter, RN Outcome: Not Progressing   Problem: Activity: Goal: Risk for activity intolerance will decrease 02/18/2018 1226 by Salley Slaughter, RN Outcome: Not Progressing 02/18/2018 1225 by Salley Slaughter, RN Outcome: Not Progressing   Problem: Nutrition: Goal: Adequate nutrition will be maintained 02/18/2018 1226 by Salley Slaughter, RN Outcome: Not Progressing 02/18/2018 1225 by Salley Slaughter, RN Outcome: Not Progressing

## 2018-02-18 NOTE — Progress Notes (Signed)
Bedside EEG completed, results pending. 

## 2018-02-18 NOTE — Consult Note (Signed)
[facility] Surgery Consult Note  Nathan [name] [birth year]  [id number].    Requesting MD: Lala Lund Chief Complaint/Reason for Consult: SBO  HPI:  Nathan [name] is a 57yo male PMH HIV, DM-II, h/o seizures, and CKD-4 who was admitted to [facility] 11/11 with generalized weakness, fever, diarrhea, and lethargy. Caretaker at bedside who assisted with this history taking. States that he started complaining of intermittent abdominal pain and diarrhea on 11/9. Diffuse abdominal pain and distension worsened on 11/11 so was brought into the ED. Metabolic and toxic encephalopathy suspected likely due to combination of diarrhea and dehydration.  Head CT nonacute, EEG results pending. He was found to have Enteroaggregative E coli in his stool for which he has received supportive care and rehydration. Today patient appeared more distended, he did not have any bowel movements. Unsure if he is passing any flatus. He also had multiple episodes of n/v this AM. CT abdomen pelvis obtained and reports distal small bowel obstruction involving the ileum just prior to the ileocecal valve.  General surgery asked to see.  No known prior h/o abdominal surgical history.  Lives in a group home  ROS: unable to fully assess ROS due to AMS   Family History  Problem Relation Age of Onset  . Other Mother        unknown medical history  . Other Father        unknown medical history  . Colon cancer Neg Hx     Past Medical History:  Diagnosis Date  . Anemia   . Anemia   . Anxiety   . Bipolar 1 disorder ([place])   . Depression, major   . HIV (human immunodeficiency virus infection) ([place])   . Hyperglycemia 08/06/2017  . Hypernatremia   . Kidney failure   . Lithium toxicity   . MR (mental retardation)   . Schizoaffective disorder ([place]) 08/06/2017  . Schizophrenia ([place])   . Seizures ([place])     Past Surgical History:  Procedure Laterality Date  . BIOPSY N/A 04/12/2014   Procedure: BIOPSY;  Surgeon: Danie Binder, MD;  Location: [facility]S;  Service: Endoscopy;  Laterality: N/A;  Gastric  . COLONOSCOPY WITH PROPOFOL N/A 04/12/2014   SLF:  1. one colon polyp removed 2. The left colon is extremely redundant 3. Small internal hemorrhoids  . ESOPHAGOGASTRODUODENOSCOPY (EGD) WITH PROPOFOL N/A 04/12/2014   SLF: Stricture at the gastroesophageal junction 2. small polyp  in teh gastric body 3. Mild Non-erosive gastritis.   Marland Kitchen NOSE SURGERY  1968   unsure what type of surgery. something to help him breathe better  . POLYPECTOMY N/A 04/12/2014   Procedure: POLYPECTOMY;  Surgeon: Danie Binder, MD;  Location: [facility]S;  Service: Endoscopy;  Laterality: N/A;  Cecal  . SAVORY DILATION N/A 04/12/2014   Procedure: SAVORY DILATION;  Surgeon: Danie Binder, MD;  Location: [facility];  Service: Endoscopy;  Laterality: N/A;  12.8/ 14/15/16  . SKIN BIOPSY      Social History:  reports that he has never smoked. He has never used smokeless tobacco. He reports that he does not drink alcohol or use drugs.  Allergies: No Known Allergies  Medications Prior to Admission  Medication Sig Dispense Refill  . atorvastatin (LIPITOR) 20 MG tablet Take 20 mg by mouth daily at 6 PM.    . benztropine (COGENTIN) 2 MG tablet Take 2 mg by mouth 2 (two) times daily.     . bictegravir-emtricitabine-tenofovir AF (BIKTARVY) 50-200-25 MG TABS tablet  Take 1 tablet by mouth daily. 30 tablet 11  . cetirizine (ZYRTEC) 10 MG tablet TAKE 1 TABLET ONCE DAILY. (Patient taking differently: Take 10 mg by mouth daily. ) 30 tablet 3  . clozapine (FAZACLO) 100 MG disintegrating tablet Take 50-100 mg by mouth See admin instructions. '100mg'$  in the morning and '50mg'$  at bedtime    . folic acid (FOLVITE) 1 MG tablet Take 1 mg by mouth daily.    Marland Kitchen gabapentin (NEURONTIN) 300 MG capsule Take 300 mg by mouth every morning. Take with the '400mg'$  tablet to equal a total of '700mg'$  in the mornings.    . gabapentin (NEURONTIN) 400 MG capsule Take 400 mg by mouth 2 (two) times daily.      . haloperidol (HALDOL) 5 MG tablet Take 2 tablets (10 mg total) by mouth daily with supper. (Patient taking differently: Take 5 mg by mouth daily with supper. )    . levETIRAcetam (KEPPRA) 500 MG tablet Take 1 tablet (500 mg total) by mouth 2 (two) times daily. 180 tablet 4  . ranitidine (ZANTAC) 150 MG tablet TAKE 1 TABLET BY MOUTH 12 HOURS AFTER TAKING REYATAZ. (Patient taking differently: Take 150 mg by mouth 2 (two) times daily. ) 30 tablet 3  . senna (SENOKOT) 8.6 MG tablet Take 2 tablets by mouth 2 (two) times daily.    Marland Kitchen valproic acid (DEPAKENE) 250 MG capsule Take 2 capsules every morning then take 4 capsules at bedtime (Patient taking differently: Take 500-1,000 mg by mouth See admin instructions. Take 2 capsules ('500mg'$ ) every morning then take 4 capsules ('1000mg'$ ) at bedtime) 180 capsule 11    Prior to Admission medications   Medication Sig Start Date End Date Taking? Authorizing Provider  atorvastatin (LIPITOR) 20 MG tablet Take 20 mg by mouth daily at 6 PM.   Yes [provider]  benztropine (COGENTIN) 2 MG tablet Take 2 mg by mouth 2 (two) times daily.    Yes [provider]  bictegravir-emtricitabine-tenofovir AF (BIKTARVY) 50-200-25 MG TABS tablet Take 1 tablet by mouth daily. 08/06/17  Yes Tommy Medal, Lavell Islam, MD  cetirizine (ZYRTEC) 10 MG tablet TAKE 1 TABLET ONCE DAILY. Patient taking differently: Take 10 mg by mouth daily.  01/21/18  Yes Stallings, Zoe A, MD  clozapine (FAZACLO) 100 MG disintegrating tablet Take 50-100 mg by mouth See admin instructions. '100mg'$  in the morning and '50mg'$  at bedtime   Yes [provider]  folic acid (FOLVITE) 1 MG tablet Take 1 mg by mouth daily.   Yes [provider]  gabapentin (NEURONTIN) 300 MG capsule Take 300 mg by mouth every morning. Take with the '400mg'$  tablet to equal a total of '700mg'$  in the mornings.   Yes [provider]  gabapentin (NEURONTIN) 400 MG capsule Take 400 mg by mouth 2 (two) times  daily.    Yes [provider]  haloperidol (HALDOL) 5 MG tablet Take 2 tablets (10 mg total) by mouth daily with supper. Patient taking differently: Take 5 mg by mouth daily with supper.  07/21/16  Yes Rogue Bussing, MD  levETIRAcetam (KEPPRA) 500 MG tablet Take 1 tablet (500 mg total) by mouth 2 (two) times daily. 02/09/18  Yes Marcial Pacas, MD  ranitidine (ZANTAC) 150 MG tablet TAKE 1 TABLET BY MOUTH 12 HOURS AFTER TAKING REYATAZ. Patient taking differently: Take 150 mg by mouth 2 (two) times daily.  10/31/12  Yes Tommy Medal, Lavell Islam, MD  senna (SENOKOT) 8.6 MG tablet Take 2 tablets by mouth  2 (two) times daily.   Yes [provider]  valproic acid (DEPAKENE) 250 MG capsule Take 2 capsules every morning then take 4 capsules at bedtime Patient taking differently: Take 500-1,000 mg by mouth See admin instructions. Take 2 capsules ('500mg'$ ) every morning then take 4 capsules ('1000mg'$ ) at bedtime 02/09/18  Yes Marcial Pacas, MD    Blood pressure (!) 122/92, pulse (!) 102, temperature 98.3 F (36.8 C), temperature source Oral, resp. rate 19, height '5\' 9"'$  (1.753 m), weight 90 kg, SpO2 99 %. Physical Exam: General: lethargic, WD/WN AA male who is laying in bed in NAD HEENT: head is normocephalic, atraumatic.  Sclera are noninjected.  Pupils equal and round.  Ears and nose without any masses or lesions.  Mouth is pink and moist. Dentition poor Heart: tachy.  No obvious murmurs, gallops, or rubs noted.  Palpable pedal pulses bilaterally Lungs: CTAB, no wheezes, rhonchi, or rales noted.  Respiratory effort nonlabored Abd: no healed incisions noted, soft, distended and tympanic, hypoactive BS, no masses, hernias, or organomegaly. Mild diffuse TTP, no peritonitis MS: all 4 extremities are symmetrical with no cyanosis, clubbing, or edema. Skin: warm and dry with no masses, lesions, or rashes Psych:  lethargic Neuro: unable to assess  Results for orders placed or performed during the  hospital encounter of 02/16/18 (from the past 48 hour(s))  Glucose, capillary     Status: Abnormal   Collection Time: 02/16/18  5:05 PM  Result Value Ref Range   Glucose-Capillary 155 (H) 70 - 99 mg/dL  Glucose, capillary     Status: Abnormal   Collection Time: 02/17/18 12:03 AM  Result Value Ref Range   Glucose-Capillary 123 (H) 70 - 99 mg/dL  Glucose, capillary     Status: Abnormal   Collection Time: 02/17/18  9:07 AM  Result Value Ref Range   Glucose-Capillary 111 (H) 70 - 99 mg/dL  Lactic acid, plasma     Status: Abnormal   Collection Time: 02/17/18 10:39 AM  Result Value Ref Range   Lactic Acid, Venous 2.4 (HH) 0.5 - 1.9 mmol/L    Comment: CRITICAL RESULT CALLED TO, READ BACK BY AND VERIFIED WITH: FOWLER,A RN @ 1122 02/17/18 LEONARD,A Performed at [facility], [place] [address] [place], [place] [address]   Glucose, capillary     Status: Abnormal   Collection Time: 02/17/18 12:26 PM  Result Value Ref Range   Glucose-Capillary 139 (H) 70 - 99 mg/dL  Culture, blood (routine x 2) Call MD if unable to obtain prior to antibiotics being given     Status: None (Preliminary result)   Collection Time: 02/17/18  1:00 PM  Result Value Ref Range   Specimen Description BLOOD LEFT ANTECUBITAL    Special Requests      BOTTLES DRAWN AEROBIC ONLY Blood Culture adequate volume   Culture      NO GROWTH < 24 HOURS Performed at [facility] [address] [place], [place] [address]    Report Status PENDING   Culture, blood (routine x 2) Call MD if unable to obtain prior to antibiotics being given     Status: None (Preliminary result)   Collection Time: 02/17/18  1:20 PM  Result Value Ref Range   Specimen Description BLOOD RIGHT ANTECUBITAL    Special Requests      BOTTLES DRAWN AEROBIC ONLY Blood Culture adequate volume   Culture      NO GROWTH < 24 HOURS Performed at [facility]  [address] [place], [place] [address]    Report Status PENDING   CBC with  Differential/Platelet     Status: Abnormal   Collection Time: 02/17/18  1:22 PM  Result Value Ref Range   WBC 10.6 (H) 4.0 - 10.5 K/uL   RBC 4.05 (L) 4.22 - 5.81 MIL/uL   Hemoglobin 11.6 (L) 13.0 - 17.0 g/dL   HCT 37.6 (L) 39.0 - 52.0 %   MCV 92.8 80.0 - 100.0 fL   MCH 28.6 26.0 - 34.0 pg   MCHC 30.9 30.0 - 36.0 g/dL   RDW 14.7 11.5 - 15.5 %   Platelets PLATELET CLUMPS NOTED ON SMEAR, UNABLE TO ESTIMATE 150 - 400 K/uL    Comment: REPEATED TO VERIFY Immature Platelet Fraction may be clinically indicated, consider ordering this additional test [id number]    nRBC 0.0 0.0 - 0.2 %   Neutrophils Relative % 67 %   Neutro Abs 7.1 1.7 - 7.7 K/uL   Lymphocytes Relative 15 %   Lymphs Abs 1.6 0.7 - 4.0 K/uL   Monocytes Relative 15 %   Monocytes Absolute 1.6 (H) 0.1 - 1.0 K/uL   Eosinophils Relative 0 %   Eosinophils Absolute 0.0 0.0 - 0.5 K/uL   Basophils Relative 1 %   Basophils Absolute 0.1 0.0 - 0.1 K/uL   Immature Granulocytes 2 %   Abs Immature Granulocytes 0.22 (H) 0.00 - 0.07 K/uL    Comment: Performed at [facility] [address], [place], [place] [address]  Basic metabolic panel     Status: Abnormal   Collection Time: 02/17/18  1:22 PM  Result Value Ref Range   Sodium 147 (H) 135 - 145 mmol/L   Potassium 3.6 3.5 - 5.1 mmol/L   Chloride 116 (H) 98 - 111 mmol/L   CO2 28 22 - 32 mmol/L   Glucose, Bld 125 (H) 70 - 99 mg/dL   BUN 22 (H) 6 - 20 mg/dL   Creatinine, Ser 1.80 (H) 0.61 - 1.24 mg/dL   Calcium 8.7 (L) 8.9 - 10.3 mg/dL   GFR calc non Af Amer 40 (L) >60 mL/min   GFR calc Af Amer 46 (L) >60 mL/min    Comment: (NOTE) The eGFR has been calculated using the CKD EPI equation. This calculation has not been validated in all clinical situations. eGFR's persistently <60 mL/min signify possible Chronic Kidney Disease.    Anion gap 3 (L) 5 - 15    Comment: REPEATED TO VERIFY Performed at [facility] [address], [place], [place] [address]   Glucose,  capillary     Status: Abnormal   Collection Time: 02/17/18  5:08 PM  Result Value Ref Range   Glucose-Capillary 156 (H) 70 - 99 mg/dL  Glucose, capillary     Status: Abnormal   Collection Time: 02/17/18  9:19 PM  Result Value Ref Range   Glucose-Capillary 109 (H) 70 - 99 mg/dL  Comprehensive metabolic panel     Status: Abnormal   Collection Time: 02/18/18  3:50 AM  Result Value Ref Range   Sodium 147 (H) 135 - 145 mmol/L   Potassium 3.6 3.5 - 5.1 mmol/L   Chloride 116 (H) 98 - 111 mmol/L   CO2 26 22 - 32 mmol/L   Glucose, Bld 128 (H) 70 - 99 mg/dL   BUN 20 6 - 20 mg/dL   Creatinine, Ser 1.80 (H) 0.61 - 1.24 mg/dL   Calcium 8.7 (L) 8.9 - 10.3 mg/dL   Total  Protein 6.5 6.5 - 8.1 g/dL   Albumin 2.7 (L) 3.5 - 5.0 g/dL   AST 25 15 - 41 U/L   ALT 19 0 - 44 U/L   Alkaline Phosphatase 38 38 - 126 U/L   Total Bilirubin 0.5 0.3 - 1.2 mg/dL   GFR calc non Af Amer 40 (L) >60 mL/min   GFR calc Af Amer 46 (L) >60 mL/min    Comment: (NOTE) The eGFR has been calculated using the CKD EPI equation. This calculation has not been validated in all clinical situations. eGFR's persistently <60 mL/min signify possible Chronic Kidney Disease.    Anion gap 5 5 - 15    Comment: Performed at [facility] [address] [place], [place] [address]  CBC with Differential/Platelet     Status: Abnormal   Collection Time: 02/18/18  3:50 AM  Result Value Ref Range   WBC 10.0 4.0 - 10.5 K/uL    Comment: WHITE COUNT CONFIRMED ON SMEAR   RBC 4.17 (L) 4.22 - 5.81 MIL/uL   Hemoglobin 11.5 (L) 13.0 - 17.0 g/dL   HCT 38.8 (L) 39.0 - 52.0 %   MCV 93.0 80.0 - 100.0 fL   MCH 27.6 26.0 - 34.0 pg   MCHC 29.6 (L) 30.0 - 36.0 g/dL   RDW 14.7 11.5 - 15.5 %   Platelets 120 (L) 150 - 400 K/uL   nRBC 0.0 0.0 - 0.2 %   Neutrophils Relative % 64 %   Neutro Abs 6.4 1.7 - 7.7 K/uL   Lymphocytes Relative 21 %   Lymphs Abs 2.1 0.7 - 4.0 K/uL   Monocytes Relative 13 %   Monocytes Absolute 1.3 (H) 0.1 - 1.0 K/uL    Eosinophils Relative 1 %   Eosinophils Absolute 0.1 0.0 - 0.5 K/uL   Basophils Relative 1 %   Basophils Absolute 0.1 0.0 - 0.1 K/uL   Immature Granulocytes 0 %   Abs Immature Granulocytes 0.04 0.00 - 0.07 K/uL    Comment: Performed at [facility], [place] [address] [place], [place] [address]  Glucose, capillary     Status: Abnormal   Collection Time: 02/18/18  7:59 AM  Result Value Ref Range   Glucose-Capillary 145 (H) 70 - 99 mg/dL  Lactic acid, plasma     Status: None   Collection Time: 02/18/18  8:24 AM  Result Value Ref Range   Lactic Acid, Venous 1.2 0.5 - 1.9 mmol/L    Comment: Performed at [facility] [address], [place], [place] [address]  Procalcitonin - Baseline     Status: None   Collection Time: 02/18/18  8:24 AM  Result Value Ref Range   Procalcitonin 3.97 ng/mL    Comment:        Interpretation: PCT > 2 ng/mL: Systemic infection (sepsis) is likely, unless other causes are known. (NOTE)       Sepsis PCT Algorithm           Lower Respiratory Tract                                      Infection PCT Algorithm    ----------------------------     ----------------------------         PCT < 0.25 ng/mL                PCT < 0.10 ng/mL  Strongly encourage             Strongly discourage   discontinuation of antibiotics    initiation of antibiotics    ----------------------------     -----------------------------       PCT 0.25 - 0.50 ng/mL            PCT 0.10 - 0.25 ng/mL               OR       >80% decrease in PCT            Discourage initiation of                                            antibiotics      Encourage discontinuation           of antibiotics    ----------------------------     -----------------------------         PCT >= 0.50 ng/mL              PCT 0.26 - 0.50 ng/mL               AND       <80% decrease in PCT              Encourage initiation of                                             antibiotics       Encourage  continuation           of antibiotics    ----------------------------     -----------------------------        PCT >= 0.50 ng/mL                  PCT > 0.50 ng/mL               AND         increase in PCT                  Strongly encourage                                      initiation of antibiotics    Strongly encourage escalation           of antibiotics                                     -----------------------------                                           PCT <= 0.25 ng/mL                                                 OR                                        >  80% decrease in PCT                                     Discontinue / Do not initiate                                             antibiotics Performed at [facility], [place] [address], [place], [place] [address]   Magnesium     Status: Abnormal   Collection Time: 02/18/18  8:24 AM  Result Value Ref Range   Magnesium 2.5 (H) 1.7 - 2.4 mg/dL    Comment: Performed at [facility] [address] [place], [place] [address]  Lactic acid, plasma     Status: None   Collection Time: 02/18/18 11:25 AM  Result Value Ref Range   Lactic Acid, Venous 1.6 0.5 - 1.9 mmol/L    Comment: Performed at [facility] [address], [place], [place] [address]  Glucose, capillary     Status: Abnormal   Collection Time: 02/18/18 12:06 PM  Result Value Ref Range   Glucose-Capillary 124 (H) 70 - 99 mg/dL   Ct Abdomen Pelvis Wo Contrast  Result Date: 02/18/2018 CLINICAL DATA:  Follow up small bowel obstruction EXAM: CT ABDOMEN AND PELVIS WITHOUT CONTRAST TECHNIQUE: Multidetector CT imaging of the abdomen and pelvis was performed following the standard protocol without IV contrast. COMPARISON:  None. FINDINGS: Lower chest: Lung bases are free of acute infiltrate or sizable effusion. Hepatobiliary: The liver and gallbladder are within normal limits. Pancreas: Unremarkable. No pancreatic ductal dilatation or surrounding  inflammatory changes. Spleen: Normal in size without focal abnormality. Adrenals/Urinary Tract: Adrenal glands are within normal limits bilaterally. No renal calculi or obstructive changes are seen. The bladder is well distended. Stomach/Bowel: Colon demonstrates scattered fecal material throughout without obstructive change. The appendix is unremarkable and air filled. The proximal small bowel appears within normal limits although in the distal jejunum and entire ileum there is small-bowel dilatation identified. No significant fecalization of small bowel contents is noted. There is a relative transition zone identified in the distal ileum just prior to the ileocecal valve. No mass lesion is seen. Correlation with any prior surgery is recommended as this may be related to adhesions. The stomach is within normal limits with the exception of a small sliding-type hiatal hernia. Vascular/Lymphatic: Aortic atherosclerosis. No enlarged abdominal or pelvic lymph nodes. Reproductive: Prostate is unremarkable. Other: No abdominal wall hernia or abnormality. No abdominopelvic ascites. Musculoskeletal: Degenerative changes of lumbar spine are seen. No acute bony abnormality is noted. IMPRESSION: Distal small bowel obstruction involving the ileum just prior to the ileocecal valve. No definitive mass lesion is identified. Correlate with any prior surgery as to the possibility of adhesions. The jejunum is nondilated. No other focal abnormality is seen. Electronically Signed   By: Inez Catalina M.D.   On: 02/18/2018 15:53   Dg Chest 2 View  Result Date: 02/17/2018 CLINICAL DATA:  History of pneumonia EXAM: CHEST - 2 VIEW COMPARISON:  02/16/2018. FINDINGS: Lung volumes with mild bibasilar atelectasis/infiltrates. No significant change from prior exam. No pleural effusion or pneumothorax. Heart size stable. IMPRESSION: Low lung volumes with mild bibasilar atelectasis/infiltrates. No significant change from prior exam.  Electronically Signed   By: Marcello Moores  Register  On: 02/17/2018 12:23   Dg Chest Port 1 View  Result Date: 02/18/2018 CLINICAL DATA:  Shortness of breath, weakness EXAM: PORTABLE CHEST 1 VIEW COMPARISON:  02/17/2018 FINDINGS: Low lung volumes. Bibasilar atelectasis. Heart is normal size. No effusions or acute bony abnormality. IMPRESSION: Low lung volumes, bibasilar atelectasis. Electronically Signed   By: Rolm Baptise M.D.   On: 02/18/2018 11:41   Dg Abd Portable 1v  Result Date: 02/18/2018 CLINICAL DATA:  Abdominal pain, nausea, vomiting EXAM: PORTABLE ABDOMEN - 1 VIEW COMPARISON:  None FINDINGS: Increased stool in proximal half of colon. Paucity of distal colonic stool. Numerous minimally dilated air-filled loops of small bowel throughout the abdomen and pelvis, question ileus versus obstruction. No bowel wall thickening. Osseous structures unremarkable. Numerous pelvic phleboliths without definite urinary tract calcification. IMPRESSION: Increased stool throughout proximal half of colon. Air-filled distended small bowel loops throughout abdomen and pelvis, question ileus or obstruction; consider assessment by CT imaging with contrast. Electronically Signed   By: Lavonia Dana M.D.   On: 02/18/2018 10:24   Anti-infectives (From admission, onward)   Start     Dose/Rate Route Frequency Ordered Stop   02/17/18 1430  azithromycin (ZITHROMAX) 500 mg in sodium chloride 0.9 % 250 mL IVPB  Status:  Discontinued     500 mg 250 mL/hr over 60 Minutes Intravenous Every 24 hours 02/17/18 1258 02/18/18 1124   02/17/18 1400  cefTRIAXone (ROCEPHIN) 1 g in sodium chloride 0.9 % 100 mL IVPB  Status:  Discontinued     1 g 200 mL/hr over 30 Minutes Intravenous Every 24 hours 02/17/18 1258 02/18/18 1124   02/16/18 2300  azithromycin (ZITHROMAX) 500 mg in sodium chloride 0.9 % 250 mL IVPB  Status:  Discontinued     500 mg 250 mL/hr over 60 Minutes Intravenous Every 24 hours 02/16/18 0428 02/16/18 1522   02/16/18  2200  cefTRIAXone (ROCEPHIN) 2 g in sodium chloride 0.9 % 100 mL IVPB  Status:  Discontinued     2 g 200 mL/hr over 30 Minutes Intravenous Every 24 hours 02/16/18 0428 02/16/18 1525   02/16/18 1800  ciprofloxacin (CIPRO) tablet 500 mg  Status:  Discontinued     500 mg Oral 2 times daily 02/16/18 1525 02/17/18 1258   02/16/18 1200  bictegravir-emtricitabine-tenofovir AF (BIKTARVY) 50-200-25 MG per tablet 1 tablet     1 tablet Oral Daily 02/16/18 0428     02/16/18 0130  cefTRIAXone (ROCEPHIN) 2 g in sodium chloride 0.9 % 100 mL IVPB  Status:  Discontinued     2 g 200 mL/hr over 30 Minutes Intravenous Every 24 hours 02/16/18 0129 02/16/18 0431   02/16/18 0130  azithromycin (ZITHROMAX) 500 mg in sodium chloride 0.9 % 250 mL IVPB  Status:  Discontinued     500 mg 250 mL/hr over 60 Minutes Intravenous Every 24 hours 02/16/18 0129 02/16/18 0431        Assessment/Plan HIV DM-II H/o seizures CKD-4   SBO ?Ileus 2/2 Enteroaggregative E coli (+) stool - No prior h/o abdominal surgery - CT scan reports distal small bowel obstruction involving the ileum just prior to the ileocecal valve - He was having diarrhea prior to admission, multiple loose BMs yesterday - Agree with NG tube for decompression and n/v symptom control.  Will continue to follow.  ID - none currently VTE - SCDs, sq heparin FEN - IVF, NPO/NGT Foley - none Follow up - TBD  Wellington Hampshire, [facility] 02/18/2018, 4:32 PM Pager: [phone]  7:00 am -11:30 AM Tues-Fri 7:00 am-4:30 pm Sat-Sun 7:00 am-11:30 am

## 2018-02-18 NOTE — Progress Notes (Addendum)
PROGRESS NOTE                                                                                                                                                                                                             Patient Demographics:    Nathan [name], is a 57 y.o. male, DOB - April 17, 1960, ZDG:[id number]  Admit date - 02/16/2018   Admitting Physician Vianne Bulls, MD  Outpatient Primary MD for the patient is Pavelock, Ralene Bathe, MD  LOS - 2  Chief Complaint  Patient presents with  . Altered Mental Status       Brief Narrative 57 year old male group home resident at [facility], H/O HIV on [place] seen in office 01/15/2018-CD4 750 viral load undetectable, Seizures with tonic-clonic movements- on Keppra and valproic acid, known history of chronic dysarthria, CKD 4, was brought in on 02/16/2018 with lethargy, diarrhea, fever elevated lactate levels, he was treated by the hospitalist team for 2 days and he was transferred to my care on 02/18/2018.   Subjective:    Nathan [name] today remains in bed, appears tired and lethargic, multiple episodes of emesis this morning, belly is distended, he is minimally responsive but has no focal deficits and appears to be no distress acutely.    Assessment  & Plan :     1.  Metabolic and toxic encephalopathy.  Likely due to combination of diarrhea and dehydration.  Head CT nonacute, exam remains nonfocal although he is quite lethargic and exam is limited, check EEG, abdominal x-ray on 02/18/2018 raises suspicion for small bowel obstruction and belly is distended, will obtain CT abdomen pelvis.  Minimize narcotics and sedative medications.  Continue supportive care and hydrate.  Addendnum - CT back at 4pm, SBP, NG to LIS - CCs consulted.   2.  Urinary retention.  Had over a liter of urine on bladder scan, for now Foley placement and monitor.  3.  History of HIV.  Continue home medication last counts were  controlled few months ago follows with Dr. Drucilla Schmidt.  4.  History of seizures.  Switched his seizure medications to IV, will check a EEG as he is quite lethargic to rule out underlying seizures.  5.  ARF on CKD 4.  Baseline creatinine is around 1.5.  Hydrate and monitor.  Also Foley placed for urinary retention.  6.  E. coli diarrhea.  Discussed with ID physician Dr. De Burrs.  No need for antibiotics for now.  7.  Dehydration.  Hydrate and monitor.  8.  Dyslipidemia.  On statin continue.  9.  Mild atelectasis.  No pneumonia.  Incentive spirometry added.  Did have 2-3 episodes of emesis early morning on 02/18/2018 will monitor for aspiration.     Family Communication  :  None  Code Status :  None  Disposition Plan  :  Stepdown  Consults  :  None  Procedures  :    CT head.  Nonacute   EEG  CT abdomen pelvis. -     DVT Prophylaxis  :   Heparin    Lab Results  Component Value Date   PLT 120 (L) 02/18/2018    Diet :  Diet Order            Diet NPO time specified Except for: Sips with Meds  Diet effective now               Inpatient Medications Scheduled Meds: . aspirin  81 mg Oral Daily  . atorvastatin  20 mg Oral q1800  . benztropine  1 mg Oral BID  . bictegravir-emtricitabine-tenofovir AF  1 tablet Oral Daily  . Chlorhexidine Gluconate Cloth  6 each Topical Q0600  . [START ON 02/19/2018] clonazePAM  0.5 mg Oral QHS  . famotidine  20 mg Oral BID  . folic acid  1 mg Oral Daily  . haloperidol  5 mg Oral Q supper  . heparin  5,000 Units Subcutaneous Q8H  . insulin aspart  0-5 Units Subcutaneous QHS  . insulin aspart  0-9 Units Subcutaneous TID WC  . levETIRAcetam  500 mg Oral BID  . mupirocin ointment  1 application Nasal BID  . sodium chloride flush  3 mL Intravenous Q12H  . valproic acid  500 mg Oral Daily   And  . valproic acid  1,000 mg Oral QHS   Continuous Infusions: . lactated ringers with kcl    . sodium chloride     PRN Meds:.[DISCONTINUED]  acetaminophen **OR** acetaminophen, ondansetron **OR** ondansetron (ZOFRAN) IV  Antibiotics  :   Anti-infectives (From admission, onward)   Start     Dose/Rate Route Frequency Ordered Stop   02/17/18 1430  azithromycin (ZITHROMAX) 500 mg in sodium chloride 0.9 % 250 mL IVPB  Status:  Discontinued     500 mg 250 mL/hr over 60 Minutes Intravenous Every 24 hours 02/17/18 1258 02/18/18 1124   02/17/18 1400  cefTRIAXone (ROCEPHIN) 1 g in sodium chloride 0.9 % 100 mL IVPB  Status:  Discontinued     1 g 200 mL/hr over 30 Minutes Intravenous Every 24 hours 02/17/18 1258 02/18/18 1124   02/16/18 2300  azithromycin (ZITHROMAX) 500 mg in sodium chloride 0.9 % 250 mL IVPB  Status:  Discontinued     500 mg 250 mL/hr over 60 Minutes Intravenous Every 24 hours 02/16/18 0428 02/16/18 1522   02/16/18 2200  cefTRIAXone (ROCEPHIN) 2 g in sodium chloride 0.9 % 100 mL IVPB  Status:  Discontinued     2 g 200 mL/hr over 30 Minutes Intravenous Every 24 hours 02/16/18 0428 02/16/18 1525   02/16/18 1800  ciprofloxacin (CIPRO) tablet 500 mg  Status:  Discontinued     500 mg Oral 2 times daily 02/16/18 1525 02/17/18 1258   02/16/18 1200  bictegravir-emtricitabine-tenofovir AF (BIKTARVY) 50-200-25 MG per tablet 1 tablet     1 tablet Oral Daily 02/16/18 0428     02/16/18 0130  cefTRIAXone (ROCEPHIN) 2 g in sodium chloride 0.9 % 100 mL IVPB  Status:  Discontinued  2 g 200 mL/hr over 30 Minutes Intravenous Every 24 hours 02/16/18 0129 02/16/18 0431   02/16/18 0130  azithromycin (ZITHROMAX) 500 mg in sodium chloride 0.9 % 250 mL IVPB  Status:  Discontinued     500 mg 250 mL/hr over 60 Minutes Intravenous Every 24 hours 02/16/18 0129 02/16/18 0431          Objective:   Vitals:   02/18/18 0209 02/18/18 0609 02/18/18 0802 02/18/18 0824  BP: 111/74  (!) 144/86 (!) 122/92  Pulse:   (!) 102   Resp: 19  18 19   Temp:  97.6 F (36.4 C) 98.1 F (36.7 C) 97.9 F (36.6 C)  TempSrc:  Oral Oral Oral  SpO2:   99%     Weight:      Height:        Wt Readings from Last 3 Encounters:  02/16/18 90 kg  02/09/18 89.4 kg  01/15/18 88.9 kg     Intake/Output Summary (Last 24 hours) at 02/18/2018 1124 Last data filed at 02/18/2018 1055 Gross per 24 hour  Intake 120 ml  Output 3470 ml  Net -3350 ml     Physical Exam  Somnolent, No new F.N deficits, Normal affect [place].AT,PERRAL Supple Neck,No JVD, No cervical lymphadenopathy appriciated.  Symmetrical Chest wall movement, Good air movement bilaterally, CTAB RRR,No Gallops,Rubs or new Murmurs, No Parasternal Heave +ve B.Sounds, Abd is distended with suprapubic fullness, No tenderness, No organomegaly appriciated, No rebound - guarding or rigidity. No Cyanosis, Clubbing or edema, No new Rash or bruise     Data Review:    CBC Recent Labs  Lab 02/16/18 0045 02/17/18 1322 02/18/18 0350  WBC 12.5* 10.6* 10.0  HGB 13.2 11.6* 11.5*  HCT 44.9 37.6* 38.8*  PLT 155 PLATELET CLUMPS NOTED ON SMEAR, UNABLE TO ESTIMATE 120*  MCV 94.9 92.8 93.0  MCH 27.9 28.6 27.6  MCHC 29.4* 30.9 29.6*  RDW 13.9 14.7 14.7  LYMPHSABS 1.2 1.6 2.1  MONOABS 1.9* 1.6* 1.3*  EOSABS 0.1 0.0 0.1  BASOSABS 0.0 0.1 0.1    Chemistries  Recent Labs  Lab 02/16/18 0045 02/17/18 1322 02/18/18 0350 02/18/18 0824  NA 140 147* 147*  --   K 4.2 3.6 3.6  --   CL 103 116* 116*  --   CO2 24 28 26   --   GLUCOSE 183* 125* 128*  --   BUN 21* 22* 20  --   CREATININE 2.05* 1.80* 1.80*  --   CALCIUM 9.0 8.7* 8.7*  --   MG  --   --   --  2.5*  AST 24  --  25  --   ALT 17  --  19  --   ALKPHOS 52  --  38  --   BILITOT 0.6  --  0.5  --    ------------------------------------------------------------------------------------------------------------------ No results for input(s): CHOL, HDL, LDLCALC, TRIG, CHOLHDL, LDLDIRECT in the last 72 hours.  No results found for:  HGBA1C ------------------------------------------------------------------------------------------------------------------ No results for input(s): TSH, T4TOTAL, T3FREE, THYROIDAB in the last 72 hours.  Invalid input(s): FREET3 ------------------------------------------------------------------------------------------------------------------ No results for input(s): VITAMINB12, FOLATE, FERRITIN, TIBC, IRON, RETICCTPCT in the last 72 hours.  Coagulation profile No results for input(s): INR, PROTIME in the last 168 hours.  No results for input(s): DDIMER in the last 72 hours.  Cardiac Enzymes No results for input(s): CKMB, TROPONINI, MYOGLOBIN in the last 168 hours.  Invalid input(s): CK ------------------------------------------------------------------------------------------------------------------ No results found for: BNP  Micro Results Recent Results (from the  past 240 hour(s))  Culture, blood (Routine X 2) w Reflex to ID Panel     Status: None (Preliminary result)   Collection Time: 02/16/18 12:51 AM  Result Value Ref Range Status   Specimen Description BLOOD RIGHT ANTECUBITAL  Final   Special Requests   Final    BOTTLES DRAWN AEROBIC AND ANAEROBIC Blood Culture adequate volume   Culture   Final    NO GROWTH 2 DAYS Performed at [facility], [address], [place], [place] [address]    Report Status PENDING  Incomplete  Culture, blood (Routine X 2) w Reflex to ID Panel     Status: None (Preliminary result)   Collection Time: 02/16/18  2:15 AM  Result Value Ref Range Status   Specimen Description BLOOD LEFT ANTECUBITAL  Final   Special Requests   Final    BOTTLES DRAWN AEROBIC AND ANAEROBIC Blood Culture adequate volume   Culture   Final    NO GROWTH 2 DAYS Performed at [facility], [place] [address], [place], [place] [address]    Report Status PENDING  Incomplete  Urine Culture     Status: None   Collection Time: 02/16/18  2:37 AM  Result Value Ref Range  Status   Specimen Description URINE, RANDOM  Final   Special Requests NONE  Final   Culture   Final    NO GROWTH Performed at [facility], [place] [address], [place], [place] [address]    Report Status 02/17/2018 FINAL  Final  C difficile quick scan w PCR reflex     Status: None   Collection Time: 02/16/18  4:16 AM  Result Value Ref Range Status   C Diff antigen NEGATIVE NEGATIVE Final   C Diff toxin NEGATIVE NEGATIVE Final   C Diff interpretation No C. difficile detected.  Final    Comment: Performed at [facility], [place] [address] [place], [place] [address]  Gastrointestinal Panel by PCR , Stool     Status: Abnormal   Collection Time: 02/16/18  4:16 AM  Result Value Ref Range Status   Campylobacter species NOT DETECTED NOT DETECTED Final   Plesimonas shigelloides NOT DETECTED NOT DETECTED Final   Salmonella species NOT DETECTED NOT DETECTED Final   Yersinia enterocolitica NOT DETECTED NOT DETECTED Final   Vibrio species NOT DETECTED NOT DETECTED Final   Vibrio cholerae NOT DETECTED NOT DETECTED Final   Enteroaggregative E coli (EAEC) DETECTED (A) NOT DETECTED Final    Comment: RESULT CALLED TO, READ BACK BY AND VERIFIED WITH: AARON MCAULEY ON 02/16/18 AT 1450 KMP    Enteropathogenic E coli (EPEC) NOT DETECTED NOT DETECTED Final   Enterotoxigenic E coli (ETEC) NOT DETECTED NOT DETECTED Final   Shiga like toxin producing E coli (STEC) NOT DETECTED NOT DETECTED Final   Shigella/Enteroinvasive E coli (EIEC) NOT DETECTED NOT DETECTED Final   Cryptosporidium NOT DETECTED NOT DETECTED Final   Cyclospora cayetanensis NOT DETECTED NOT DETECTED Final   Entamoeba histolytica NOT DETECTED NOT DETECTED Final   Giardia lamblia NOT DETECTED NOT DETECTED Final   Adenovirus F40/41 NOT DETECTED NOT DETECTED Final   Astrovirus NOT DETECTED NOT DETECTED Final   Norovirus GI/GII NOT DETECTED NOT DETECTED Final   Rotavirus A NOT DETECTED NOT DETECTED Final   Sapovirus (I, II, IV, and  V) NOT DETECTED NOT DETECTED Final    Comment: Performed at [facility], [address], [place],  [address]  MRSA PCR Screening  Status: Abnormal   Collection Time: 02/16/18  2:31 PM  Result Value Ref Range Status   MRSA by PCR POSITIVE (A) NEGATIVE Final    Comment:        The GeneXpert MRSA Assay (FDA approved for NASAL specimens only), is one component of a comprehensive MRSA colonization surveillance program. It is not intended to diagnose MRSA infection nor to guide or monitor treatment for MRSA infections. RESULT CALLED TO, READ BACK BY AND VERIFIED WITHHeron Sabins RN 02/16/18 1834 JDW Performed at [facility], [place] [address], [place], [place] [address]   Culture, blood (routine x 2) Call MD if unable to obtain prior to antibiotics being given     Status: None (Preliminary result)   Collection Time: 02/17/18  1:00 PM  Result Value Ref Range Status   Specimen Description BLOOD LEFT ANTECUBITAL  Final   Special Requests   Final    BOTTLES DRAWN AEROBIC ONLY Blood Culture adequate volume   Culture   Final    NO GROWTH < 24 HOURS Performed at [facility], [address] [place], [place] [address]    Report Status PENDING  Incomplete  Culture, blood (routine x 2) Call MD if unable to obtain prior to antibiotics being given     Status: None (Preliminary result)   Collection Time: 02/17/18  1:20 PM  Result Value Ref Range Status   Specimen Description BLOOD RIGHT ANTECUBITAL  Final   Special Requests   Final    BOTTLES DRAWN AEROBIC ONLY Blood Culture adequate volume   Culture   Final    NO GROWTH < 24 HOURS Performed at [facility], [address], [place], [place] [address]    Report Status PENDING  Incomplete    Radiology Reports Dg Chest 2 View  Result Date: 02/17/2018 CLINICAL DATA:  History of pneumonia EXAM: CHEST - 2 VIEW COMPARISON:  02/16/2018. FINDINGS: Lung volumes with mild bibasilar atelectasis/infiltrates. No  significant change from prior exam. No pleural effusion or pneumothorax. Heart size stable. IMPRESSION: Low lung volumes with mild bibasilar atelectasis/infiltrates. No significant change from prior exam. Electronically Signed   By: Marcello Moores  Register   On: 02/17/2018 12:23   Ct Head Wo Contrast  Result Date: 02/16/2018 CLINICAL DATA:  Initial evaluation for acute altered mental status. EXAM: CT HEAD WITHOUT CONTRAST TECHNIQUE: Contiguous axial images were obtained from the base of the skull through the vertex without intravenous contrast. COMPARISON:  Prior CT from 05/05/2011. FINDINGS: Brain: Mild age-related cerebral atrophy with chronic small vessel ischemic disease. No acute intracranial hemorrhage. No acute large vessel territory infarct. No mass lesion, midline shift or mass effect. No hydrocephalus. No extra-axial fluid collection. Vascular: No hyperdense vessel. Skull: Scalp soft tissues and calvarium within normal limits. Sinuses/Orbits: Globes and orbital soft tissues demonstrate no acute abnormality. Chronic left sphenoid sinusitis noted. Paranasal sinuses are otherwise largely clear. Trace opacity right mastoid air cells, of doubtful significance. Mastoid air cells otherwise clear. Other: None. IMPRESSION: 1. No acute intracranial abnormality. 2. Mild age-related cerebral atrophy with chronic small vessel ischemic disease. 3. Chronic left frontal sinusitis. Electronically Signed   By: Jeannine Boga M.D.   On: 02/16/2018 01:31   Dg Chest Port 1 View  Result Date: 02/16/2018 CLINICAL DATA:  Altered mental status for 1 day. EXAM: PORTABLE CHEST 1 VIEW COMPARISON:  November 01, 2017 FINDINGS: The cardiomediastinal silhouette is normal. Mild opacity in left base. No other acute abnormalities. No change in the  cardiomediastinal silhouette. IMPRESSION: Mild opacity in the left base may represent atelectasis or early infiltrate. Recommend clinical correlation. Electronically Signed   By: Dorise Bullion III M.D   On: 02/16/2018 01:00   Dg Abd Portable 1v  Result Date: 02/18/2018 CLINICAL DATA:  Abdominal pain, nausea, vomiting EXAM: PORTABLE ABDOMEN - 1 VIEW COMPARISON:  None FINDINGS: Increased stool in proximal half of colon. Paucity of distal colonic stool. Numerous minimally dilated air-filled loops of small bowel throughout the abdomen and pelvis, question ileus versus obstruction. No bowel wall thickening. Osseous structures unremarkable. Numerous pelvic phleboliths without definite urinary tract calcification. IMPRESSION: Increased stool throughout proximal half of colon. Air-filled distended small bowel loops throughout abdomen and pelvis, question ileus or obstruction; consider assessment by CT imaging with contrast. Electronically Signed   By: Lavonia Dana M.D.   On: 02/18/2018 10:24    Time Spent in minutes  30   Lala Lund M.D on 02/18/2018 at 11:24 AM  To page go to [email or web address] - password [facility]

## 2018-02-19 ENCOUNTER — Inpatient Hospital Stay (HOSPITAL_COMMUNITY): Payer: Medicare Other

## 2018-02-19 LAB — COMPREHENSIVE METABOLIC PANEL
ALBUMIN: 2.3 g/dL — AB (ref 3.5–5.0)
ALK PHOS: 43 U/L (ref 38–126)
ALT: 14 U/L (ref 0–44)
AST: 18 U/L (ref 15–41)
Anion gap: 6 (ref 5–15)
BUN: 22 mg/dL — ABNORMAL HIGH (ref 6–20)
CALCIUM: 8.3 mg/dL — AB (ref 8.9–10.3)
CHLORIDE: 119 mmol/L — AB (ref 98–111)
CO2: 25 mmol/L (ref 22–32)
CREATININE: 1.76 mg/dL — AB (ref 0.61–1.24)
GFR calc Af Amer: 47 mL/min — ABNORMAL LOW (ref >60)
GFR calc non Af Amer: 41 mL/min — ABNORMAL LOW (ref >60)
GLUCOSE: 125 mg/dL — AB (ref 70–99)
Potassium: 4 mmol/L (ref 3.5–5.1)
SODIUM: 150 mmol/L — AB (ref 135–145)
Total Bilirubin: 0.3 mg/dL (ref 0.3–1.2)
Total Protein: 5.8 g/dL — ABNORMAL LOW (ref 6.5–8.1)

## 2018-02-19 LAB — CBC WITH DIFFERENTIAL/PLATELET
Abs Immature Granulocytes: 0.25 10*3/uL — ABNORMAL HIGH (ref 0.00–0.07)
BASOS ABS: 0.1 10*3/uL (ref 0.0–0.1)
Basophils Relative: 1 %
EOS ABS: 0.2 10*3/uL (ref 0.0–0.5)
Eosinophils Relative: 2 %
HEMATOCRIT: 38.3 % — AB (ref 39.0–52.0)
HEMOGLOBIN: 11.6 g/dL — AB (ref 13.0–17.0)
IMMATURE GRANULOCYTES: 3 %
LYMPHS ABS: 2 10*3/uL (ref 0.7–4.0)
LYMPHS PCT: 20 %
MCH: 28.2 pg (ref 26.0–34.0)
MCHC: 30.3 g/dL (ref 30.0–36.0)
MCV: 93.2 fL (ref 80.0–100.0)
MONOS PCT: 9 %
Monocytes Absolute: 0.9 10*3/uL (ref 0.1–1.0)
NEUTROS PCT: 65 %
Neutro Abs: 6.6 10*3/uL (ref 1.7–7.7)
Platelets: 159 10*3/uL (ref 150–400)
RBC: 4.11 MIL/uL — ABNORMAL LOW (ref 4.22–5.81)
RDW: 15.1 % (ref 11.5–15.5)
WBC: 10 10*3/uL (ref 4.0–10.5)
nRBC: 0 % (ref 0.0–0.2)

## 2018-02-19 LAB — PROCALCITONIN: PROCALCITONIN: 2.47 ng/mL

## 2018-02-19 LAB — GLUCOSE, CAPILLARY
GLUCOSE-CAPILLARY: 143 mg/dL — AB (ref 70–99)
GLUCOSE-CAPILLARY: 128 mg/dL — AB (ref 70–99)
Glucose-Capillary: 108 mg/dL — ABNORMAL HIGH (ref 70–99)
Glucose-Capillary: 113 mg/dL — ABNORMAL HIGH (ref 70–99)

## 2018-02-19 MED ORDER — DEXTROSE 5 % IV SOLN: INTRAVENOUS | Status: DC

## 2018-02-19 MED ORDER — DEXTROSE 5 % IV SOLN
INTRAVENOUS | Status: DC
  Administered 2018-02-19 – 2018-02-20 (×3): via INTRAVENOUS

## 2018-02-19 MED ORDER — INSULIN ASPART 100 UNIT/ML ~~LOC~~ SOLN
0.0000 [IU] | SUBCUTANEOUS | Status: DC
  Administered 2018-02-19 – 2018-02-22 (×7): 1 [IU] via SUBCUTANEOUS
  Administered 2018-02-22: 2 [IU] via SUBCUTANEOUS
  Administered 2018-02-22 – 2018-02-23 (×2): 1 [IU] via SUBCUTANEOUS
  Administered 2018-02-23 (×3): 2 [IU] via SUBCUTANEOUS
  Administered 2018-02-24: 1 [IU] via SUBCUTANEOUS
  Administered 2018-02-24: 2 [IU] via SUBCUTANEOUS
  Administered 2018-02-24: 1 [IU] via SUBCUTANEOUS
  Administered 2018-02-24: 2 [IU] via SUBCUTANEOUS

## 2018-02-19 MED ORDER — TAMSULOSIN HCL 0.4 MG PO CAPS
0.4000 mg | ORAL_CAPSULE | Freq: Every day | ORAL | Status: DC
  Administered 2018-02-19 – 2018-02-24 (×6): 0.4 mg via ORAL
  Filled 2018-02-19 (×6): qty 1

## 2018-02-19 NOTE — Progress Notes (Signed)
Notified Schorr, NP that pt has not voided my shift, bladder scanner results showed greater than 200cc of urine. Pt's abdomen distended. I&O cath patient with 2 RN's at bedside. I&O cath results were 1050cc of amber colored urine with strong smell. Will continue to monitor pt. Ranelle Oyster, RN

## 2018-02-19 NOTE — Progress Notes (Signed)
PROGRESS NOTE                                                                                                                                                                                                             Patient Demographics:    Nathan [name], is a 57 y.o. male, DOB - 14-Dec-1960, RCB:[id number]  Admit date - 02/16/2018   Admitting Physician Vianne Bulls, MD  Outpatient Primary MD for the patient is Pavelock, Ralene Bathe, MD  LOS - 3  Chief Complaint  Patient presents with  . Altered Mental Status       Brief Narrative 57 year old male group home resident at [facility], H/O HIV on Ezel seen in office 01/15/2018-CD4 750 viral load undetectable, Seizures with tonic-clonic movements- on Keppra and valproic acid, known history of chronic dysarthria, CKD 4, was brought in on 02/16/2018 with lethargy, diarrhea, fever elevated lactate levels, he was treated by the hospitalist team for 2 days and he was transferred to my care on 02/18/2018.   Subjective:   Patient in bed appears to be in no distress, more awake than yesterday denies any headache chest or abdominal pain does say that he gets mildly nauseated.   Assessment  & Plan :     1.  Metabolic and toxic encephalopathy.  Likely due to combination of diarrhea, dehydration, Hypernatremia and SBO.  Head CT was unremarkable, diarrhea likely was obstructive with unclear significance of enteral aggregate of E. coli which question if it was a colonizer.  CT scan suggested SVR.  EEG was unremarkable, supportive care his mentation is improving continue to monitor.  2.  SBO.  NG tube which he unfortunately removed twice on 02/18/2018, replaced, surgery following, bowel rest, restraints if needed to prevent from NG tube getting pulled again.   Repeat KUB in the morning.  3.  Urinary retention.  Had over a liter of urine on bladder scan, for now Foley-Flomax placement and monitor.  4.  History of  seizures.  Switched his seizure medications to IV, will check a EEG as he is quite lethargic to rule out underlying seizures.  5.  ARF on CKD 4.  Baseline creatinine is around 1.5.  Hydrate and monitor.  Also Foley placed for urinary retention.  6.  E. coli diarrhea.  Discussed with ID physician Dr. De Burrs.  No need for antibiotics for now.  Question if this was just a colonizing bacteria.  7.  Dehydration with hypernatremia.  Switch IV fluids to D5W  and monitor  8.  Dyslipidemia.  On statin continue.  9.  Mild atelectasis.  No pneumonia.  Incentive spirometry added.  Did have 2-3 episodes of emesis early morning on 02/18/2018 currently no signs of aspiration will continue to monitor clinically.  10. History of HIV.  Continue home medication last counts were controlled few months ago follows with Dr. Drucilla Schmidt.   Family Communication  :  None  Code Status :  None  Disposition Plan  :  Stepdown  Consults  :  CCS  Procedures  :    CT head.  Nonacute   EEG - no seizure activity  CT abdomen pelvis. - SBO    DVT Prophylaxis  :   Heparin    Lab Results  Component Value Date   PLT 159 02/19/2018    Diet :  Diet Order            Diet NPO time specified Except for: Sips with Meds  Diet effective now               Inpatient Medications Scheduled Meds: . aspirin  81 mg Oral Daily  . atorvastatin  20 mg Oral q1800  . benztropine  1 mg Oral BID  . bictegravir-emtricitabine-tenofovir AF  1 tablet Oral Daily  . Chlorhexidine Gluconate Cloth  6 each Topical Q0600  . clonazePAM  0.5 mg Oral QHS  . famotidine  20 mg Oral BID  . folic acid  1 mg Oral Daily  . haloperidol  5 mg Oral Q supper  . heparin  5,000 Units Subcutaneous Q8H  . insulin aspart  0-9 Units Subcutaneous Q4H  . mupirocin ointment  1 application Nasal BID  . sodium chloride flush  3 mL Intravenous Q12H  . tamsulosin  0.4 mg Oral Daily   Continuous Infusions: . dextrose    . levETIRAcetam 500 mg (02/19/18  0350)  . valproate sodium 500 mg (02/19/18 0950)   And  . valproate sodium 1,000 mg (02/18/18 2122)   PRN Meds:.[DISCONTINUED] acetaminophen **OR** acetaminophen, [DISCONTINUED] ondansetron **OR** ondansetron (ZOFRAN) IV  Antibiotics  :   Anti-infectives (From admission, onward)   Start     Dose/Rate Route Frequency Ordered Stop   02/17/18 1430  azithromycin (ZITHROMAX) 500 mg in sodium chloride 0.9 % 250 mL IVPB  Status:  Discontinued     500 mg 250 mL/hr over 60 Minutes Intravenous Every 24 hours 02/17/18 1258 02/18/18 1124   02/17/18 1400  cefTRIAXone (ROCEPHIN) 1 g in sodium chloride 0.9 % 100 mL IVPB  Status:  Discontinued     1 g 200 mL/hr over 30 Minutes Intravenous Every 24 hours 02/17/18 1258 02/18/18 1124   02/16/18 2300  azithromycin (ZITHROMAX) 500 mg in sodium chloride 0.9 % 250 mL IVPB  Status:  Discontinued     500 mg 250 mL/hr over 60 Minutes Intravenous Every 24 hours 02/16/18 0428 02/16/18 1522   02/16/18 2200  cefTRIAXone (ROCEPHIN) 2 g in sodium chloride 0.9 % 100 mL IVPB  Status:  Discontinued     2 g 200 mL/hr over 30 Minutes Intravenous Every 24 hours 02/16/18 0428 02/16/18 1525   02/16/18 1800  ciprofloxacin (CIPRO) tablet 500 mg  Status:  Discontinued     500 mg Oral 2 times daily 02/16/18 1525 02/17/18 1258   02/16/18 1200  bictegravir-emtricitabine-tenofovir AF (BIKTARVY) 50-200-25 MG per tablet 1 tablet     1 tablet Oral Daily 02/16/18 0428     02/16/18 0130  cefTRIAXone (ROCEPHIN) 2  g in sodium chloride 0.9 % 100 mL IVPB  Status:  Discontinued     2 g 200 mL/hr over 30 Minutes Intravenous Every 24 hours 02/16/18 0129 02/16/18 0431   02/16/18 0130  azithromycin (ZITHROMAX) 500 mg in sodium chloride 0.9 % 250 mL IVPB  Status:  Discontinued     500 mg 250 mL/hr over 60 Minutes Intravenous Every 24 hours 02/16/18 0129 02/16/18 0431          Objective:   Vitals:   02/19/18 0329 02/19/18 0334 02/19/18 0540 02/19/18 0854  BP: 123/77     Pulse:        Resp: 13     Temp:  98.4 F (36.9 C)  97.6 F (36.4 C)  TempSrc:  Oral  Oral  SpO2:      Weight:   85.4 kg   Height:        Wt Readings from Last 3 Encounters:  02/19/18 85.4 kg  02/09/18 89.4 kg  01/15/18 88.9 kg     Intake/Output Summary (Last 24 hours) at 02/19/2018 1005 Last data filed at 02/19/2018 0750 Gross per 24 hour  Intake 1922.77 ml  Output 1150 ml  Net 772.77 ml     Physical Exam  Awake Alert,  No new F.N deficits  [place].AT,PERRAL Supple Neck,No JVD, No cervical lymphadenopathy appriciated.  Symmetrical Chest wall movement, Good air movement bilaterally, CTAB RRR,No Gallops, Rubs or new Murmurs, No Parasternal Heave +ve B.Sounds, Abd Soft but mildly distended, No tenderness, No organomegaly appriciated, No rebound - guarding or rigidity. No Cyanosis, Clubbing or edema, No new Rash or bruise    Data Review:    CBC Recent Labs  Lab 02/16/18 0045 02/17/18 1322 02/18/18 0350 02/19/18 0706  WBC 12.5* 10.6* 10.0 10.0  HGB 13.2 11.6* 11.5* 11.6*  HCT 44.9 37.6* 38.8* 38.3*  PLT 155 PLATELET CLUMPS NOTED ON SMEAR, UNABLE TO ESTIMATE 120* 159  MCV 94.9 92.8 93.0 93.2  MCH 27.9 28.6 27.6 28.2  MCHC 29.4* 30.9 29.6* 30.3  RDW 13.9 14.7 14.7 15.1  LYMPHSABS 1.2 1.6 2.1 2.0  MONOABS 1.9* 1.6* 1.3* 0.9  EOSABS 0.1 0.0 0.1 0.2  BASOSABS 0.0 0.1 0.1 0.1    Chemistries  Recent Labs  Lab 02/16/18 0045 02/17/18 1322 02/18/18 0350 02/18/18 0824 02/19/18 0706  NA 140 147* 147*  --  150*  K 4.2 3.6 3.6  --  4.0  CL 103 116* 116*  --  119*  CO2 24 28 26   --  25  GLUCOSE 183* 125* 128*  --  125*  BUN 21* 22* 20  --  22*  CREATININE 2.05* 1.80* 1.80*  --  1.76*  CALCIUM 9.0 8.7* 8.7*  --  8.3*  MG  --   --   --  2.5*  --   AST 24  --  25  --  18  ALT 17  --  19  --  14  ALKPHOS 52  --  38  --  43  BILITOT 0.6  --  0.5  --  0.3   ------------------------------------------------------------------------------------------------------------------ No  results for input(s): CHOL, HDL, LDLCALC, TRIG, CHOLHDL, LDLDIRECT in the last 72 hours.  No results found for: HGBA1C ------------------------------------------------------------------------------------------------------------------ No results for input(s): TSH, T4TOTAL, T3FREE, THYROIDAB in the last 72 hours.  Invalid input(s): FREET3 ------------------------------------------------------------------------------------------------------------------ No results for input(s): VITAMINB12, FOLATE, FERRITIN, TIBC, IRON, RETICCTPCT in the last 72 hours.  Coagulation profile No results for input(s): INR, PROTIME in the last 168 hours.  No results for input(s): DDIMER in the last 72 hours.  Cardiac Enzymes No results for input(s): CKMB, TROPONINI, MYOGLOBIN in the last 168 hours.  Invalid input(s): CK ------------------------------------------------------------------------------------------------------------------ No results found for: BNP  Micro Results Recent Results (from the past 240 hour(s))  Culture, blood (Routine X 2) w Reflex to ID Panel     Status: None (Preliminary result)   Collection Time: 02/16/18 12:51 AM  Result Value Ref Range Status   Specimen Description BLOOD RIGHT ANTECUBITAL  Final   Special Requests   Final    BOTTLES DRAWN AEROBIC AND ANAEROBIC Blood Culture adequate volume   Culture   Final    NO GROWTH 3 DAYS Performed at [facility], [address], [place], [place] [address]    Report Status PENDING  Incomplete  Culture, blood (Routine X 2) w Reflex to ID Panel     Status: None (Preliminary result)   Collection Time: 02/16/18  2:15 AM  Result Value Ref Range Status   Specimen Description BLOOD LEFT ANTECUBITAL  Final   Special Requests   Final    BOTTLES DRAWN AEROBIC AND ANAEROBIC Blood Culture adequate volume   Culture   Final    NO GROWTH 3 DAYS Performed at [facility], [place] [address], [place], [place] [address]    Report Status  PENDING  Incomplete  Urine Culture     Status: None   Collection Time: 02/16/18  2:37 AM  Result Value Ref Range Status   Specimen Description URINE, RANDOM  Final   Special Requests NONE  Final   Culture   Final    NO GROWTH Performed at [facility], [place] [address] [place], [place] [address]    Report Status 02/17/2018 FINAL  Final  C difficile quick scan w PCR reflex     Status: None   Collection Time: 02/16/18  4:16 AM  Result Value Ref Range Status   C Diff antigen NEGATIVE NEGATIVE Final   C Diff toxin NEGATIVE NEGATIVE Final   C Diff interpretation No C. difficile detected.  Final    Comment: Performed at [facility], [place] [address] [place], [place] [address]  Gastrointestinal Panel by PCR , Stool     Status: Abnormal   Collection Time: 02/16/18  4:16 AM  Result Value Ref Range Status   Campylobacter species NOT DETECTED NOT DETECTED Final   Plesimonas shigelloides NOT DETECTED NOT DETECTED Final   Salmonella species NOT DETECTED NOT DETECTED Final   Yersinia enterocolitica NOT DETECTED NOT DETECTED Final   Vibrio species NOT DETECTED NOT DETECTED Final   Vibrio cholerae NOT DETECTED NOT DETECTED Final   Enteroaggregative E coli (EAEC) DETECTED (A) NOT DETECTED Final    Comment: RESULT CALLED TO, READ BACK BY AND VERIFIED WITH: AARON MCAULEY ON 02/16/18 AT 1450 KMP    Enteropathogenic E coli (EPEC) NOT DETECTED NOT DETECTED Final   Enterotoxigenic E coli (ETEC) NOT DETECTED NOT DETECTED Final   Shiga like toxin producing E coli (STEC) NOT DETECTED NOT DETECTED Final   Shigella/Enteroinvasive E coli (EIEC) NOT DETECTED NOT DETECTED Final   Cryptosporidium NOT DETECTED NOT DETECTED Final   Cyclospora cayetanensis NOT DETECTED NOT DETECTED Final   Entamoeba histolytica NOT DETECTED NOT DETECTED Final   Giardia lamblia NOT DETECTED NOT DETECTED Final   Adenovirus F40/41 NOT DETECTED NOT DETECTED Final   Astrovirus NOT DETECTED NOT DETECTED Final   Norovirus  GI/GII NOT DETECTED NOT DETECTED Final  Rotavirus A NOT DETECTED NOT DETECTED Final   Sapovirus (I, II, IV, and V) NOT DETECTED NOT DETECTED Final    Comment: Performed at [facility], [place] [place], [place] [address]  MRSA PCR Screening     Status: Abnormal   Collection Time: 02/16/18  2:31 PM  Result Value Ref Range Status   MRSA by PCR POSITIVE (A) NEGATIVE Final    Comment:        The GeneXpert MRSA Assay (FDA approved for NASAL specimens only), is one component of a comprehensive MRSA colonization surveillance program. It is not intended to diagnose MRSA infection nor to guide or monitor treatment for MRSA infections. RESULT CALLED TO, READ BACK BY AND VERIFIED WITHHeron Sabins RN 02/16/18 1834 JDW Performed at [facility], [place] [address] [place], [place] [address]   Culture, blood (routine x 2) Call MD if unable to obtain prior to antibiotics being given     Status: None (Preliminary result)   Collection Time: 02/17/18  1:00 PM  Result Value Ref Range Status   Specimen Description BLOOD LEFT ANTECUBITAL  Final   Special Requests   Final    BOTTLES DRAWN AEROBIC ONLY Blood Culture adequate volume   Culture   Final    NO GROWTH 2 DAYS Performed at [facility], [place] [address], [place], [place] [address]    Report Status PENDING  Incomplete  Culture, blood (routine x 2) Call MD if unable to obtain prior to antibiotics being given     Status: None (Preliminary result)   Collection Time: 02/17/18  1:20 PM  Result Value Ref Range Status   Specimen Description BLOOD RIGHT ANTECUBITAL  Final   Special Requests   Final    BOTTLES DRAWN AEROBIC ONLY Blood Culture adequate volume   Culture   Final    NO GROWTH 2 DAYS Performed at [facility], [address] [place], [place] [address]    Report Status PENDING  Incomplete    Radiology Reports Ct Abdomen Pelvis Wo Contrast  Result Date: 02/18/2018 CLINICAL DATA:  Follow up small  bowel obstruction EXAM: CT ABDOMEN AND PELVIS WITHOUT CONTRAST TECHNIQUE: Multidetector CT imaging of the abdomen and pelvis was performed following the standard protocol without IV contrast. COMPARISON:  None. FINDINGS: Lower chest: Lung bases are free of acute infiltrate or sizable effusion. Hepatobiliary: The liver and gallbladder are within normal limits. Pancreas: Unremarkable. No pancreatic ductal dilatation or surrounding inflammatory changes. Spleen: Normal in size without focal abnormality. Adrenals/Urinary Tract: Adrenal glands are within normal limits bilaterally. No renal calculi or obstructive changes are seen. The bladder is well distended. Stomach/Bowel: Colon demonstrates scattered fecal material throughout without obstructive change. The appendix is unremarkable and air filled. The proximal small bowel appears within normal limits although in the distal jejunum and entire ileum there is small-bowel dilatation identified. No significant fecalization of small bowel contents is noted. There is a relative transition zone identified in the distal ileum just prior to the ileocecal valve. No mass lesion is seen. Correlation with any prior surgery is recommended as this may be related to adhesions. The stomach is within normal limits with the exception of a small sliding-type hiatal hernia. Vascular/Lymphatic: Aortic atherosclerosis. No enlarged abdominal or pelvic lymph nodes. Reproductive: Prostate is unremarkable. Other: No abdominal wall hernia or abnormality. No abdominopelvic ascites. Musculoskeletal: Degenerative changes of lumbar spine are seen. No acute bony abnormality is noted. IMPRESSION: Distal small bowel obstruction involving  the ileum just prior to the ileocecal valve. No definitive mass lesion is identified. Correlate with any prior surgery as to the possibility of adhesions. The jejunum is nondilated. No other focal abnormality is seen. Electronically Signed   By: Inez Catalina M.D.   On:  02/18/2018 15:53   Dg Chest 2 View  Result Date: 02/17/2018 CLINICAL DATA:  History of pneumonia EXAM: CHEST - 2 VIEW COMPARISON:  02/16/2018. FINDINGS: Lung volumes with mild bibasilar atelectasis/infiltrates. No significant change from prior exam. No pleural effusion or pneumothorax. Heart size stable. IMPRESSION: Low lung volumes with mild bibasilar atelectasis/infiltrates. No significant change from prior exam. Electronically Signed   By: Marcello Moores  Register   On: 02/17/2018 12:23   Dg Abd 1 View  Result Date: 02/18/2018 CLINICAL DATA:  57 year old male status post nasogastric tube placement EXAM: ABDOMEN - 1 VIEW COMPARISON:  Prior abdominal radiograph obtained earlier today FINDINGS: Interval placement of a gastric tube. The tip of the tube overlies the gastric fundus. Persistent gaseous dilatation of numerous loops of small bowel in the abdomen consistent with small bowel obstruction. No acute osseous abnormality. IMPRESSION: The tip of the nasogastric tube overlies the gastric fundus. Electronically Signed   By: Jacqulynn Cadet M.D.   On: 02/18/2018 18:33   Ct Head Wo Contrast  Result Date: 02/16/2018 CLINICAL DATA:  Initial evaluation for acute altered mental status. EXAM: CT HEAD WITHOUT CONTRAST TECHNIQUE: Contiguous axial images were obtained from the base of the skull through the vertex without intravenous contrast. COMPARISON:  Prior CT from 05/05/2011. FINDINGS: Brain: Mild age-related cerebral atrophy with chronic small vessel ischemic disease. No acute intracranial hemorrhage. No acute large vessel territory infarct. No mass lesion, midline shift or mass effect. No hydrocephalus. No extra-axial fluid collection. Vascular: No hyperdense vessel. Skull: Scalp soft tissues and calvarium within normal limits. Sinuses/Orbits: Globes and orbital soft tissues demonstrate no acute abnormality. Chronic left sphenoid sinusitis noted. Paranasal sinuses are otherwise largely clear. Trace opacity  right mastoid air cells, of doubtful significance. Mastoid air cells otherwise clear. Other: None. IMPRESSION: 1. No acute intracranial abnormality. 2. Mild age-related cerebral atrophy with chronic small vessel ischemic disease. 3. Chronic left frontal sinusitis. Electronically Signed   By: Jeannine Boga M.D.   On: 02/16/2018 01:31   Dg Chest Port 1 View  Result Date: 02/19/2018 CLINICAL DATA:  Acute shortness of breath EXAM: PORTABLE CHEST 1 VIEW COMPARISON:  02/18/2018 and prior radiographs FINDINGS: The cardiomediastinal silhouette is unremarkable. Mild bibasilar atelectasis again noted. There is no evidence of focal airspace disease, pulmonary edema, suspicious pulmonary nodule/mass, pleural effusion, or pneumothorax. No acute bony abnormalities are identified. IMPRESSION: Unchanged appearance of the chest with mild bibasilar atelectasis. Electronically Signed   By: Margarette Canada M.D.   On: 02/19/2018 09:16   Dg Chest Port 1 View  Result Date: 02/18/2018 CLINICAL DATA:  Shortness of breath, weakness EXAM: PORTABLE CHEST 1 VIEW COMPARISON:  02/17/2018 FINDINGS: Low lung volumes. Bibasilar atelectasis. Heart is normal size. No effusions or acute bony abnormality. IMPRESSION: Low lung volumes, bibasilar atelectasis. Electronically Signed   By: Rolm Baptise M.D.   On: 02/18/2018 11:41   Dg Chest Port 1 View  Result Date: 02/16/2018 CLINICAL DATA:  Altered mental status for 1 day. EXAM: PORTABLE CHEST 1 VIEW COMPARISON:  November 01, 2017 FINDINGS: The cardiomediastinal silhouette is normal. Mild opacity in left base. No other acute abnormalities. No change in the cardiomediastinal silhouette. IMPRESSION: Mild opacity in the left base may represent atelectasis or  early infiltrate. Recommend clinical correlation. Electronically Signed   By: Dorise Bullion III M.D   On: 02/16/2018 01:00   Dg Abd Portable 1v  Result Date: 02/19/2018 CLINICAL DATA:  Small bowel obstruction. EXAM: PORTABLE ABDOMEN -  1 VIEW COMPARISON:  02/18/2018 FINDINGS: The enteric tube has been removed. Numerous gas-filled loops of dilated small bowel have not significantly changed. A moderate amount of stool is present in the colon. No acute osseous abnormality is seen. IMPRESSION: Interval removal of enteric tube. Unchanged small bowel dilatation consistent with obstruction. Electronically Signed   By: Logan Bores M.D.   On: 02/19/2018 09:00   Dg Abd Portable 1v  Result Date: 02/18/2018 CLINICAL DATA:  Abdominal pain, nausea, vomiting EXAM: PORTABLE ABDOMEN - 1 VIEW COMPARISON:  None FINDINGS: Increased stool in proximal half of colon. Paucity of distal colonic stool. Numerous minimally dilated air-filled loops of small bowel throughout the abdomen and pelvis, question ileus versus obstruction. No bowel wall thickening. Osseous structures unremarkable. Numerous pelvic phleboliths without definite urinary tract calcification. IMPRESSION: Increased stool throughout proximal half of colon. Air-filled distended small bowel loops throughout abdomen and pelvis, question ileus or obstruction; consider assessment by CT imaging with contrast. Electronically Signed   By: Lavonia Dana M.D.   On: 02/18/2018 10:24    Time Spent in minutes  30   Lala Lund M.D on 02/19/2018 at 10:05 AM  To page go to [email or web address] - password [facility]

## 2018-02-19 NOTE — Progress Notes (Signed)
ABD xray completed. Passed on to on-coming RN to start suction when xray is read.

## 2018-02-19 NOTE — Plan of Care (Signed)

## 2018-02-19 NOTE — Progress Notes (Addendum)
Initial Nutrition Assessment  DOCUMENTATION CODES:   Not applicable  INTERVENTION:  Once diet advanced:  -Boost Breeze po TID, each supplement provides 250 kcal and 9 grams of protein   NUTRITION DIAGNOSIS:   Inadequate oral intake related to inability to eat as evidenced by NPO status.  GOAL:   Patient will meet greater than or equal to 90% of their needs  MONITOR:   Diet advancement, PO intake, Supplement acceptance, Labs  REASON FOR ASSESSMENT:   Low Braden    ASSESSMENT:  Nathan [name] is a 57 yo male with PMH of MR, schizophrenia, bipolar disorder, HIV/AIDS, T2DM, CKD III, and seizure disorder, presenting from group home for evaluation of generalized weakness and lethargy. Admitted 11/11 for sepsis due to PNA.   CT scan found SBO involving ileum prior to ileocecal valve NG tube placed for decompression and n/v symptom control Pt has pulled out multiple tubes.   Visited pt at bedside. Pt has intellectual disability so unable to obtain diet or wt hx. Pt also with AMS and unsure if at his baseline.   Asked pt if he was eating well PTA. Pt says "I was eating regular foods" and continues to repeat this phrase throughout entirety of assessment, is not able to answer any other questions.   Pt currently NPO, on bowel rest for SBO, is having intermittent diarrhea and nausea.   Pt was NPO on admission 11/11, ate 100% of a meal 11/12, and has been NPO since 11/13. Will follow along with regard to diet advancement.  NG tube was not in place at time of visit due to pt repeatedly ripping out, however has been replaced this afternoon. Emesis NG output has been 120 ml x 24 hrs with 2 unmeasured occurrences.  Meds: bictegravir, pepcid, folic acid, novolog ss Labs: CBG 108-145, sodium 150, BUN 22, Cr 1.76, magnesium 2.5 (11/13)  NUTRITION - FOCUSED PHYSICAL EXAM:    Most Recent Value  Orbital Region  No depletion  Upper Arm Region  No depletion  Thoracic and Lumbar Region  No  depletion  Buccal Region  No depletion  Temple Region  Mild depletion  Clavicle Bone Region  Mild depletion  Clavicle and Acromion Bone Region  Mild depletion  Scapular Bone Region  Unable to assess  Dorsal Hand  Unable to assess  Patellar Region  Mild depletion  Anterior Thigh Region  Mild depletion  Posterior Calf Region  Mild depletion  Edema (RD Assessment)  None  Hair  Reviewed  Eyes  Reviewed  Mouth  Reviewed [poor dentition]  Skin  Reviewed  Nails  Reviewed      Diet Order:   Diet Order            Diet NPO time specified Except for: Sips with Meds  Diet effective now              EDUCATION NEEDS:   Not appropriate for education at this time  Skin:  Skin Assessment: Skin Integrity Issues: Skin Integrity Issues:: Other (Comment) Other: skin tear on buttocks  Last BM:  11/14 type 7  Height:   Ht Readings from Last 1 Encounters:  02/16/18 5\' 9"  (1.753 m)    Weight:   Wt Readings from Last 1 Encounters:  02/19/18 85.4 kg    Ideal Body Weight:  72.7 kg  BMI:  Body mass index is 27.8 kg/m.  Estimated Nutritional Needs:   Kcal:  2100-2300  Protein:  105-115 gm  Fluid:  >/=2.1 L  [place], Dietetic Intern [phone]

## 2018-02-19 NOTE — Progress Notes (Signed)
Subjective/Chief Complaint: Pt pulled out multiple NGs.    Objective: Vital signs in last 24 hours: Temp:  [97.6 F (36.4 C)-98.7 F (37.1 C)] 97.6 F (36.4 C) (11/14 0854) Resp:  [13-18] 13 (11/14 0329) BP: (116-137)/(77-95) 123/77 (11/14 0329) SpO2:  [99 %-100 %] 100 % (11/13 1413) Weight:  [85.4 kg] 85.4 kg (11/14 0540) Last BM Date: 02/19/18  Intake/Output from previous day: 11/13 0701 - 11/14 0700 In: 1776.1 [I.V.:1471; IV Piggyback:305.1] Out: 3370 [Urine:3250; Emesis/NG output:120] Intake/Output this shift: Total I/O In: 146.7 [I.V.:146.7] Out: -   General appearance: alert and attempts to communicate, asking for something to drink Resp: unlabored Cardio: regular rate and rhythm GI: soft, less distended than yesterday, nontender Extremities: extremities normal, atraumatic, no cyanosis or edema Skin: Skin color, texture, turgor normal. No rashes or lesions Neurologic: follows commands.   Lab Results:  Recent Labs    02/18/18 0350 02/19/18 0706  WBC 10.0 10.0  HGB 11.5* 11.6*  HCT 38.8* 38.3*  PLT 120* 159   BMET Recent Labs    02/18/18 0350 02/19/18 0706  NA 147* 150*  K 3.6 4.0  CL 116* 119*  CO2 26 25  GLUCOSE 128* 125*  BUN 20 22*  CREATININE 1.80* 1.76*  CALCIUM 8.7* 8.3*   PT/INR No results for input(s): LABPROT, INR in the last 72 hours. ABG No results for input(s): PHART, HCO3 in the last 72 hours.  Invalid input(s): PCO2, PO2  Studies/Results: Ct Abdomen Pelvis Wo Contrast  Result Date: 02/18/2018 CLINICAL DATA:  Follow up small bowel obstruction EXAM: CT ABDOMEN AND PELVIS WITHOUT CONTRAST TECHNIQUE: Multidetector CT imaging of the abdomen and pelvis was performed following the standard protocol without IV contrast. COMPARISON:  None. FINDINGS: Lower chest: Lung bases are free of acute infiltrate or sizable effusion. Hepatobiliary: The liver and gallbladder are within normal limits. Pancreas: Unremarkable. No pancreatic ductal  dilatation or surrounding inflammatory changes. Spleen: Normal in size without focal abnormality. Adrenals/Urinary Tract: Adrenal glands are within normal limits bilaterally. No renal calculi or obstructive changes are seen. The bladder is well distended. Stomach/Bowel: Colon demonstrates scattered fecal material throughout without obstructive change. The appendix is unremarkable and air filled. The proximal small bowel appears within normal limits although in the distal jejunum and entire ileum there is small-bowel dilatation identified. No significant fecalization of small bowel contents is noted. There is a relative transition zone identified in the distal ileum just prior to the ileocecal valve. No mass lesion is seen. Correlation with any prior surgery is recommended as this may be related to adhesions. The stomach is within normal limits with the exception of a small sliding-type hiatal hernia. Vascular/Lymphatic: Aortic atherosclerosis. No enlarged abdominal or pelvic lymph nodes. Reproductive: Prostate is unremarkable. Other: No abdominal wall hernia or abnormality. No abdominopelvic ascites. Musculoskeletal: Degenerative changes of lumbar spine are seen. No acute bony abnormality is noted. IMPRESSION: Distal small bowel obstruction involving the ileum just prior to the ileocecal valve. No definitive mass lesion is identified. Correlate with any prior surgery as to the possibility of adhesions. The jejunum is nondilated. No other focal abnormality is seen. Electronically Signed   By: Inez Catalina M.D.   On: 02/18/2018 15:53   Dg Chest 2 View  Result Date: 02/17/2018 CLINICAL DATA:  History of pneumonia EXAM: CHEST - 2 VIEW COMPARISON:  02/16/2018. FINDINGS: Lung volumes with mild bibasilar atelectasis/infiltrates. No significant change from prior exam. No pleural effusion or pneumothorax. Heart size stable. IMPRESSION: Low lung volumes with mild  bibasilar atelectasis/infiltrates. No significant change  from prior exam. Electronically Signed   By: Marcello Moores  Register   On: 02/17/2018 12:23   Dg Abd 1 View  Result Date: 02/18/2018 CLINICAL DATA:  57 year old male status post nasogastric tube placement EXAM: ABDOMEN - 1 VIEW COMPARISON:  Prior abdominal radiograph obtained earlier today FINDINGS: Interval placement of a gastric tube. The tip of the tube overlies the gastric fundus. Persistent gaseous dilatation of numerous loops of small bowel in the abdomen consistent with small bowel obstruction. No acute osseous abnormality. IMPRESSION: The tip of the nasogastric tube overlies the gastric fundus. Electronically Signed   By: Jacqulynn Cadet M.D.   On: 02/18/2018 18:33   Dg Chest Port 1 View  Result Date: 02/19/2018 CLINICAL DATA:  Acute shortness of breath EXAM: PORTABLE CHEST 1 VIEW COMPARISON:  02/18/2018 and prior radiographs FINDINGS: The cardiomediastinal silhouette is unremarkable. Mild bibasilar atelectasis again noted. There is no evidence of focal airspace disease, pulmonary edema, suspicious pulmonary nodule/mass, pleural effusion, or pneumothorax. No acute bony abnormalities are identified. IMPRESSION: Unchanged appearance of the chest with mild bibasilar atelectasis. Electronically Signed   By: Margarette Canada M.D.   On: 02/19/2018 09:16   Dg Chest Port 1 View  Result Date: 02/18/2018 CLINICAL DATA:  Shortness of breath, weakness EXAM: PORTABLE CHEST 1 VIEW COMPARISON:  02/17/2018 FINDINGS: Low lung volumes. Bibasilar atelectasis. Heart is normal size. No effusions or acute bony abnormality. IMPRESSION: Low lung volumes, bibasilar atelectasis. Electronically Signed   By: Rolm Baptise M.D.   On: 02/18/2018 11:41   Dg Abd Portable 1v  Result Date: 02/19/2018 CLINICAL DATA:  Small bowel obstruction. EXAM: PORTABLE ABDOMEN - 1 VIEW COMPARISON:  02/18/2018 FINDINGS: The enteric tube has been removed. Numerous gas-filled loops of dilated small bowel have not significantly changed. A moderate  amount of stool is present in the colon. No acute osseous abnormality is seen. IMPRESSION: Interval removal of enteric tube. Unchanged small bowel dilatation consistent with obstruction. Electronically Signed   By: Logan Bores M.D.   On: 02/19/2018 09:00   Dg Abd Portable 1v  Result Date: 02/18/2018 CLINICAL DATA:  Abdominal pain, nausea, vomiting EXAM: PORTABLE ABDOMEN - 1 VIEW COMPARISON:  None FINDINGS: Increased stool in proximal half of colon. Paucity of distal colonic stool. Numerous minimally dilated air-filled loops of small bowel throughout the abdomen and pelvis, question ileus versus obstruction. No bowel wall thickening. Osseous structures unremarkable. Numerous pelvic phleboliths without definite urinary tract calcification. IMPRESSION: Increased stool throughout proximal half of colon. Air-filled distended small bowel loops throughout abdomen and pelvis, question ileus or obstruction; consider assessment by CT imaging with contrast. Electronically Signed   By: Lavonia Dana M.D.   On: 02/18/2018 10:24    Anti-infectives: Anti-infectives (From admission, onward)   Start     Dose/Rate Route Frequency Ordered Stop   02/17/18 1430  azithromycin (ZITHROMAX) 500 mg in sodium chloride 0.9 % 250 mL IVPB  Status:  Discontinued     500 mg 250 mL/hr over 60 Minutes Intravenous Every 24 hours 02/17/18 1258 02/18/18 1124   02/17/18 1400  cefTRIAXone (ROCEPHIN) 1 g in sodium chloride 0.9 % 100 mL IVPB  Status:  Discontinued     1 g 200 mL/hr over 30 Minutes Intravenous Every 24 hours 02/17/18 1258 02/18/18 1124   02/16/18 2300  azithromycin (ZITHROMAX) 500 mg in sodium chloride 0.9 % 250 mL IVPB  Status:  Discontinued     500 mg 250 mL/hr over 60 Minutes Intravenous Every  24 hours 02/16/18 0428 02/16/18 1522   02/16/18 2200  cefTRIAXone (ROCEPHIN) 2 g in sodium chloride 0.9 % 100 mL IVPB  Status:  Discontinued     2 g 200 mL/hr over 30 Minutes Intravenous Every 24 hours 02/16/18 0428 02/16/18  1525   02/16/18 1800  ciprofloxacin (CIPRO) tablet 500 mg  Status:  Discontinued     500 mg Oral 2 times daily 02/16/18 1525 02/17/18 1258   02/16/18 1200  bictegravir-emtricitabine-tenofovir AF (BIKTARVY) 50-200-25 MG per tablet 1 tablet     1 tablet Oral Daily 02/16/18 0428     02/16/18 0130  cefTRIAXone (ROCEPHIN) 2 g in sodium chloride 0.9 % 100 mL IVPB  Status:  Discontinued     2 g 200 mL/hr over 30 Minutes Intravenous Every 24 hours 02/16/18 0129 02/16/18 0431   02/16/18 0130  azithromycin (ZITHROMAX) 500 mg in sodium chloride 0.9 % 250 mL IVPB  Status:  Discontinued     500 mg 250 mL/hr over 60 Minutes Intravenous Every 24 hours 02/16/18 0129 02/16/18 0431      Assessment/Plan: HIV DM-II H/o seizures CKD-4   SBO ?Ileus 2/2 Enteroaggregative E coli (+) stool - No prior h/o abdominal surgery - CT scan reports distal small bowel obstruction involving the ileum just prior to the ileocecal valve - He was having diarrhea prior to admission, multiple loose BMs yesterday - has not tolerated NG.   Continue bowel rest and supportive care. Serial exams/ xrays. Will continue to follow.   ID - none currently VTE - SCDs, sq heparin FEN - IVF, NPO Foley - none Follow up - TBD    LOS: 3 days    Clovis Riley 02/19/2018

## 2018-02-20 ENCOUNTER — Inpatient Hospital Stay (HOSPITAL_COMMUNITY): Payer: Medicare Other

## 2018-02-20 LAB — CBC WITH DIFFERENTIAL/PLATELET
BASOS ABS: 0.1 10*3/uL (ref 0.0–0.1)
Basophils Relative: 1 %
EOS ABS: 0.3 10*3/uL (ref 0.0–0.5)
Eosinophils Relative: 3 %
HCT: 37 % — ABNORMAL LOW (ref 39.0–52.0)
HEMOGLOBIN: 11.5 g/dL — AB (ref 13.0–17.0)
LYMPHS PCT: 21 %
Lymphs Abs: 1.8 10*3/uL (ref 0.7–4.0)
MCH: 28.8 pg (ref 26.0–34.0)
MCHC: 31.1 g/dL (ref 30.0–36.0)
MCV: 92.7 fL (ref 80.0–100.0)
Monocytes Absolute: 0.9 10*3/uL (ref 0.1–1.0)
Monocytes Relative: 10 %
NEUTROS ABS: 5.3 10*3/uL (ref 1.7–7.7)
NEUTROS PCT: 59 %
Platelets: 148 10*3/uL — ABNORMAL LOW (ref 150–400)
RBC: 3.99 MIL/uL — ABNORMAL LOW (ref 4.22–5.81)
RDW: 15.1 % (ref 11.5–15.5)
WBC MORPHOLOGY: INCREASED
WBC: 8.8 10*3/uL (ref 4.0–10.5)
nRBC: 0.2 % (ref 0.0–0.2)

## 2018-02-20 LAB — COMPREHENSIVE METABOLIC PANEL
ALBUMIN: 2.5 g/dL — AB (ref 3.5–5.0)
ALT: 15 U/L (ref 0–44)
ANION GAP: 8 (ref 5–15)
AST: 17 U/L (ref 15–41)
Alkaline Phosphatase: 43 U/L (ref 38–126)
BUN: 18 mg/dL (ref 6–20)
CHLORIDE: 113 mmol/L — AB (ref 98–111)
CO2: 25 mmol/L (ref 22–32)
Calcium: 8.3 mg/dL — ABNORMAL LOW (ref 8.9–10.3)
Creatinine, Ser: 1.81 mg/dL — ABNORMAL HIGH (ref 0.61–1.24)
GFR calc non Af Amer: 39 mL/min — ABNORMAL LOW (ref >60)
GFR, EST AFRICAN AMERICAN: 45 mL/min — AB (ref >60)
Glucose, Bld: 125 mg/dL — ABNORMAL HIGH (ref 70–99)
POTASSIUM: 3.4 mmol/L — AB (ref 3.5–5.1)
SODIUM: 146 mmol/L — AB (ref 135–145)
Total Bilirubin: 0.5 mg/dL (ref 0.3–1.2)
Total Protein: 6.1 g/dL — ABNORMAL LOW (ref 6.5–8.1)

## 2018-02-20 LAB — STREP PNEUMONIAE URINARY ANTIGEN: Strep Pneumo Urinary Antigen: NEGATIVE

## 2018-02-20 LAB — GLUCOSE, CAPILLARY
GLUCOSE-CAPILLARY: 107 mg/dL — AB (ref 70–99)
GLUCOSE-CAPILLARY: 133 mg/dL — AB (ref 70–99)
GLUCOSE-CAPILLARY: 137 mg/dL — AB (ref 70–99)
Glucose-Capillary: 125 mg/dL — ABNORMAL HIGH (ref 70–99)
Glucose-Capillary: 124 mg/dL — ABNORMAL HIGH (ref 70–99)
Glucose-Capillary: 104 mg/dL — ABNORMAL HIGH (ref 70–99)

## 2018-02-20 LAB — MAGNESIUM: MAGNESIUM: 2.2 mg/dL (ref 1.7–2.4)

## 2018-02-20 LAB — PROCALCITONIN: PROCALCITONIN: 1.57 ng/mL

## 2018-02-20 MED ORDER — BISACODYL 10 MG RE SUPP
10.0000 mg | Freq: Once | RECTAL | Status: AC
  Administered 2018-02-20: 10 mg via RECTAL
  Filled 2018-02-20: qty 1

## 2018-02-20 MED ORDER — POTASSIUM CHLORIDE 2 MEQ/ML IV SOLN
INTRAVENOUS | Status: DC
  Administered 2018-02-20 (×2): via INTRAVENOUS
  Filled 2018-02-20 (×3): qty 1000

## 2018-02-20 NOTE — Progress Notes (Signed)
Pt's NG tube slid out when pt was assisted up to [facility] by sitter.   Tape and device holder still in place, tube slid out by gravity when pt stood up.   (NG tube was clamped at 0950 per orders and sips clears started).

## 2018-02-20 NOTE — Progress Notes (Signed)
Pt has tolerated sips of clear liquids throughout the day, pt denies nausea. Pt has had small BM after suppository earlier today, and passing some gas.

## 2018-02-20 NOTE — Progress Notes (Signed)
PROGRESS NOTE                                                                                                                                                                                                             Patient Demographics:    Nathan [name], is a 57 y.o. male, DOB - Aug 09, 1960, HUT:[id number]  Admit date - 02/16/2018   Admitting Physician Vianne Bulls, MD  Outpatient Primary MD for the patient is Pavelock, Ralene Bathe, MD  LOS - 4  Chief Complaint  Patient presents with  . Altered Mental Status       Brief Narrative 57 year old male group home resident at [facility], H/O HIV on [place] seen in office 01/15/2018-CD4 750 viral load undetectable, Seizures with tonic-clonic movements- on Keppra and valproic acid, known history of chronic dysarthria, CKD 4, was brought in on 02/16/2018 with lethargy, diarrhea, fever elevated lactate levels, he was treated by the hospitalist team for 2 days and he was transferred to my care on 02/18/2018.   Subjective:   Patient in bed, appears comfortable, denies any headache, no fever, no chest pain or pressure, no shortness of breath , no abdominal pain no nausea. No focal weakness.    Assessment  & Plan :     1.  Metabolic and toxic encephalopathy.  Likely due to combination of diarrhea, dehydration, Hypernatremia and SBO.  Improved, unremarkable EEG, head CT unremarkable no focal neuro deficits hydration and supportive care mentation is improving.  He is now mildly lethargic but overall much improved.  2.  SBO.  At best partial versus ileus, contrast and stool along with gas seen in sigmoid colon, discussed with general surgery on 02/20/2018, clamp NG, Dulcolax suppository and monitor.    3.  Urinary retention.  Continue Foley and Flomax.  4.  History of seizures.  Switched his seizure medications to IV, stable EEG this admission.  5.  ARF on CKD 4.  Baseline creatinine is around 1.5.  Hydrate and  monitor.  Also Foley placed for urinary retention.  6.  E. coli diarrhea.  Discussed with ID physician Dr. De Burrs.  No need for antibiotics for now.  Question if this was just a colonizing bacteria.  7.  Dehydration with hypernatremia and hypokalemia.  Placed on D5W with potassium.  Improving will monitor.  8.  Dyslipidemia.  On statin continue.  9.  Mild atelectasis.  No pneumonia.  Incentive spirometry added.  Did have 2-3 episodes of emesis early morning on 02/18/2018  currently no signs of aspiration will continue to monitor clinically.  10. History of HIV.  Continue home medication last counts were controlled few months ago follows with Dr. Drucilla Schmidt.   Family Communication  :  None  Code Status :  None  Disposition Plan  :  Stepdown  Consults  :  CCS  Procedures  :    CT head.  Nonacute   EEG - no seizure activity  CT abdomen pelvis. - SBO    DVT Prophylaxis  :   Heparin    Lab Results  Component Value Date   PLT 148 (L) 02/20/2018    Diet :  Diet Order            Diet NPO time specified Except for: Sips with Meds, Ice Chips, Other (See Comments)  Diet effective now               Inpatient Medications Scheduled Meds: . aspirin  81 mg Oral Daily  . atorvastatin  20 mg Oral q1800  . benztropine  1 mg Oral BID  . bictegravir-emtricitabine-tenofovir AF  1 tablet Oral Daily  . bisacodyl  10 mg Rectal Once  . Chlorhexidine Gluconate Cloth  6 each Topical Q0600  . clonazePAM  0.5 mg Oral QHS  . famotidine  20 mg Oral BID  . folic acid  1 mg Oral Daily  . haloperidol  5 mg Oral Q supper  . heparin  5,000 Units Subcutaneous Q8H  . insulin aspart  0-9 Units Subcutaneous Q4H  . mupirocin ointment  1 application Nasal BID  . sodium chloride flush  3 mL Intravenous Q12H  . tamsulosin  0.4 mg Oral Daily   Continuous Infusions: . dextrose 5 % with kcl 75 mL/hr at 02/20/18 0956  . levETIRAcetam 400 mL/hr at 02/20/18 0310  . valproate sodium 500 mg (02/19/18 0950)     And  . valproate sodium Stopped (02/19/18 2206)   PRN Meds:.[DISCONTINUED] acetaminophen **OR** acetaminophen, [DISCONTINUED] ondansetron **OR** ondansetron (ZOFRAN) IV  Antibiotics  :   Anti-infectives (From admission, onward)   Start     Dose/Rate Route Frequency Ordered Stop   02/17/18 1430  azithromycin (ZITHROMAX) 500 mg in sodium chloride 0.9 % 250 mL IVPB  Status:  Discontinued     500 mg 250 mL/hr over 60 Minutes Intravenous Every 24 hours 02/17/18 1258 02/18/18 1124   02/17/18 1400  cefTRIAXone (ROCEPHIN) 1 g in sodium chloride 0.9 % 100 mL IVPB  Status:  Discontinued     1 g 200 mL/hr over 30 Minutes Intravenous Every 24 hours 02/17/18 1258 02/18/18 1124   02/16/18 2300  azithromycin (ZITHROMAX) 500 mg in sodium chloride 0.9 % 250 mL IVPB  Status:  Discontinued     500 mg 250 mL/hr over 60 Minutes Intravenous Every 24 hours 02/16/18 0428 02/16/18 1522   02/16/18 2200  cefTRIAXone (ROCEPHIN) 2 g in sodium chloride 0.9 % 100 mL IVPB  Status:  Discontinued     2 g 200 mL/hr over 30 Minutes Intravenous Every 24 hours 02/16/18 0428 02/16/18 1525   02/16/18 1800  ciprofloxacin (CIPRO) tablet 500 mg  Status:  Discontinued     500 mg Oral 2 times daily 02/16/18 1525 02/17/18 1258   02/16/18 1200  bictegravir-emtricitabine-tenofovir AF (BIKTARVY) 50-200-25 MG per tablet 1 tablet     1 tablet Oral Daily 02/16/18 0428     02/16/18 0130  cefTRIAXone (ROCEPHIN) 2 g in sodium chloride 0.9 % 100 mL IVPB  Status:  Discontinued     2 g 200 mL/hr over 30 Minutes Intravenous Every 24 hours 02/16/18 0129 02/16/18 0431   02/16/18 0130  azithromycin (ZITHROMAX) 500 mg in sodium chloride 0.9 % 250 mL IVPB  Status:  Discontinued     500 mg 250 mL/hr over 60 Minutes Intravenous Every 24 hours 02/16/18 0129 02/16/18 0431          Objective:   Vitals:   02/20/18 0104 02/20/18 0419 02/20/18 0505 02/20/18 0757  BP:   135/64   Pulse:      Resp:   (!) 21   Temp: 98.2 F (36.8 C) 98.1 F  (36.7 C) 98.1 F (36.7 C) 98 F (36.7 C)  TempSrc: Oral   Oral  SpO2: 100%     Weight:      Height:        Wt Readings from Last 3 Encounters:  02/19/18 85.4 kg  02/09/18 89.4 kg  01/15/18 88.9 kg     Intake/Output Summary (Last 24 hours) at 02/20/2018 1005 Last data filed at 02/20/2018 0420 Gross per 24 hour  Intake 2545.49 ml  Output 2500 ml  Net 45.49 ml     Physical Exam  Awake, mildly confused, No new F.N deficits, Normal affect [place].AT,PERRAL, NG in place, Foley in place Supple Neck,No JVD, No cervical lymphadenopathy appriciated.  Symmetrical Chest wall movement, Good air movement bilaterally, CTAB RRR,No Gallops, Rubs or new Murmurs, No Parasternal Heave +ve B.Sounds, Abd Soft, No tenderness, No organomegaly appriciated, No rebound - guarding or rigidity. No Cyanosis, Clubbing or edema, No new Rash or bruise     Data Review:    CBC Recent Labs  Lab 02/16/18 0045 02/17/18 1322 02/18/18 0350 02/19/18 0706 02/20/18 0256  WBC 12.5* 10.6* 10.0 10.0 8.8  HGB 13.2 11.6* 11.5* 11.6* 11.5*  HCT 44.9 37.6* 38.8* 38.3* 37.0*  PLT 155 PLATELET CLUMPS NOTED ON SMEAR, UNABLE TO ESTIMATE 120* 159 148*  MCV 94.9 92.8 93.0 93.2 92.7  MCH 27.9 28.6 27.6 28.2 28.8  MCHC 29.4* 30.9 29.6* 30.3 31.1  RDW 13.9 14.7 14.7 15.1 15.1  LYMPHSABS 1.2 1.6 2.1 2.0 1.8  MONOABS 1.9* 1.6* 1.3* 0.9 0.9  EOSABS 0.1 0.0 0.1 0.2 0.3  BASOSABS 0.0 0.1 0.1 0.1 0.1    Chemistries  Recent Labs  Lab 02/16/18 0045 02/17/18 1322 02/18/18 0350 02/18/18 0824 02/19/18 0706 02/20/18 0256  NA 140 147* 147*  --  150* 146*  K 4.2 3.6 3.6  --  4.0 3.4*  CL 103 116* 116*  --  119* 113*  CO2 24 28 26   --  25 25  GLUCOSE 183* 125* 128*  --  125* 125*  BUN 21* 22* 20  --  22* 18  CREATININE 2.05* 1.80* 1.80*  --  1.76* 1.81*  CALCIUM 9.0 8.7* 8.7*  --  8.3* 8.3*  MG  --   --   --  2.5*  --  2.2  AST 24  --  25  --  18 17  ALT 17  --  19  --  14 15  ALKPHOS 52  --  38  --  43 43   BILITOT 0.6  --  0.5  --  0.3 0.5   ------------------------------------------------------------------------------------------------------------------ No results for input(s): CHOL, HDL, LDLCALC, TRIG, CHOLHDL, LDLDIRECT in the last 72 hours.  No results found for: HGBA1C ------------------------------------------------------------------------------------------------------------------ No results for input(s): TSH, T4TOTAL, T3FREE, THYROIDAB in the last 72 hours.  Invalid input(s): FREET3 ------------------------------------------------------------------------------------------------------------------ No results  for input(s): VITAMINB12, FOLATE, FERRITIN, TIBC, IRON, RETICCTPCT in the last 72 hours.  Coagulation profile No results for input(s): INR, PROTIME in the last 168 hours.  No results for input(s): DDIMER in the last 72 hours.  Cardiac Enzymes No results for input(s): CKMB, TROPONINI, MYOGLOBIN in the last 168 hours.  Invalid input(s): CK ------------------------------------------------------------------------------------------------------------------ No results found for: BNP  Micro Results Recent Results (from the past 240 hour(s))  Culture, blood (Routine X 2) w Reflex to ID Panel     Status: None (Preliminary result)   Collection Time: 02/16/18 12:51 AM  Result Value Ref Range Status   Specimen Description BLOOD RIGHT ANTECUBITAL  Final   Special Requests   Final    BOTTLES DRAWN AEROBIC AND ANAEROBIC Blood Culture adequate volume   Culture   Final    NO GROWTH 4 DAYS Performed at [facility], [address] [place], [place] [address]    Report Status PENDING  Incomplete  Culture, blood (Routine X 2) w Reflex to ID Panel     Status: None (Preliminary result)   Collection Time: 02/16/18  2:15 AM  Result Value Ref Range Status   Specimen Description BLOOD LEFT ANTECUBITAL  Final   Special Requests   Final    BOTTLES DRAWN AEROBIC AND ANAEROBIC Blood  Culture adequate volume   Culture   Final    NO GROWTH 4 DAYS Performed at [facility], [place] [address], [place], [place] [address]    Report Status PENDING  Incomplete  Urine Culture     Status: None   Collection Time: 02/16/18  2:37 AM  Result Value Ref Range Status   Specimen Description URINE, RANDOM  Final   Special Requests NONE  Final   Culture   Final    NO GROWTH Performed at [facility], [place] [address] [place], [place] [address]    Report Status 02/17/2018 FINAL  Final  C difficile quick scan w PCR reflex     Status: None   Collection Time: 02/16/18  4:16 AM  Result Value Ref Range Status   C Diff antigen NEGATIVE NEGATIVE Final   C Diff toxin NEGATIVE NEGATIVE Final   C Diff interpretation No C. difficile detected.  Final    Comment: Performed at [facility], [place] [address], [place],  [address]  Gastrointestinal Panel by PCR , Stool     Status: Abnormal   Collection Time: 02/16/18  4:16 AM  Result Value Ref Range Status   Campylobacter species NOT DETECTED NOT DETECTED Final   Plesimonas shigelloides NOT DETECTED NOT DETECTED Final   Salmonella species NOT DETECTED NOT DETECTED Final   Yersinia enterocolitica NOT DETECTED NOT DETECTED Final   Vibrio species NOT DETECTED NOT DETECTED Final   Vibrio cholerae NOT DETECTED NOT DETECTED Final   Enteroaggregative E coli (EAEC) DETECTED (A) NOT DETECTED Final    Comment: RESULT CALLED TO, READ BACK BY AND VERIFIED WITH: AARON MCAULEY ON 02/16/18 AT 1450 KMP    Enteropathogenic E coli (EPEC) NOT DETECTED NOT DETECTED Final   Enterotoxigenic E coli (ETEC) NOT DETECTED NOT DETECTED Final   Shiga like toxin producing E coli (STEC) NOT DETECTED NOT DETECTED Final   Shigella/Enteroinvasive E coli (EIEC) NOT DETECTED NOT DETECTED Final   Cryptosporidium NOT DETECTED NOT DETECTED Final   Cyclospora cayetanensis NOT DETECTED NOT DETECTED Final   Entamoeba histolytica NOT DETECTED NOT DETECTED Final    Giardia lamblia NOT DETECTED NOT  DETECTED Final   Adenovirus F40/41 NOT DETECTED NOT DETECTED Final   Astrovirus NOT DETECTED NOT DETECTED Final   Norovirus GI/GII NOT DETECTED NOT DETECTED Final   Rotavirus A NOT DETECTED NOT DETECTED Final   Sapovirus (I, II, IV, and V) NOT DETECTED NOT DETECTED Final    Comment: Performed at [facility], [address] [place], [place] [address]  MRSA PCR Screening     Status: Abnormal   Collection Time: 02/16/18  2:31 PM  Result Value Ref Range Status   MRSA by PCR POSITIVE (A) NEGATIVE Final    Comment:        The GeneXpert MRSA Assay (FDA approved for NASAL specimens only), is one component of a comprehensive MRSA colonization surveillance program. It is not intended to diagnose MRSA infection nor to guide or monitor treatment for MRSA infections. RESULT CALLED TO, READ BACK BY AND VERIFIED WITHHeron Sabins RN 02/16/18 1834 JDW Performed at [facility], [place] [address] [place], [place] [address]   Culture, blood (routine x 2) Call MD if unable to obtain prior to antibiotics being given     Status: None (Preliminary result)   Collection Time: 02/17/18  1:00 PM  Result Value Ref Range Status   Specimen Description BLOOD LEFT ANTECUBITAL  Final   Special Requests   Final    BOTTLES DRAWN AEROBIC ONLY Blood Culture adequate volume   Culture   Final    NO GROWTH 3 DAYS Performed at [facility], [place] [address], [place], [place] [address]    Report Status PENDING  Incomplete  Culture, blood (routine x 2) Call MD if unable to obtain prior to antibiotics being given     Status: None (Preliminary result)   Collection Time: 02/17/18  1:20 PM  Result Value Ref Range Status   Specimen Description BLOOD RIGHT ANTECUBITAL  Final   Special Requests   Final    BOTTLES DRAWN AEROBIC ONLY Blood Culture adequate volume   Culture   Final    NO GROWTH 3 DAYS Performed at [facility], [address], [place], [place]  [address]    Report Status PENDING  Incomplete    Radiology Reports Ct Abdomen Pelvis Wo Contrast  Result Date: 02/18/2018 CLINICAL DATA:  Follow up small bowel obstruction EXAM: CT ABDOMEN AND PELVIS WITHOUT CONTRAST TECHNIQUE: Multidetector CT imaging of the abdomen and pelvis was performed following the standard protocol without IV contrast. COMPARISON:  None. FINDINGS: Lower chest: Lung bases are free of acute infiltrate or sizable effusion. Hepatobiliary: The liver and gallbladder are within normal limits. Pancreas: Unremarkable. No pancreatic ductal dilatation or surrounding inflammatory changes. Spleen: Normal in size without focal abnormality. Adrenals/Urinary Tract: Adrenal glands are within normal limits bilaterally. No renal calculi or obstructive changes are seen. The bladder is well distended. Stomach/Bowel: Colon demonstrates scattered fecal material throughout without obstructive change. The appendix is unremarkable and air filled. The proximal small bowel appears within normal limits although in the distal jejunum and entire ileum there is small-bowel dilatation identified. No significant fecalization of small bowel contents is noted. There is a relative transition zone identified in the distal ileum just prior to the ileocecal valve. No mass lesion is seen. Correlation with any prior surgery is recommended as this may be related to adhesions. The stomach is within normal limits with the exception of a small sliding-type hiatal hernia. Vascular/Lymphatic: Aortic atherosclerosis. No enlarged abdominal or pelvic lymph nodes. Reproductive: Prostate is unremarkable.  Other: No abdominal wall hernia or abnormality. No abdominopelvic ascites. Musculoskeletal: Degenerative changes of lumbar spine are seen. No acute bony abnormality is noted. IMPRESSION: Distal small bowel obstruction involving the ileum just prior to the ileocecal valve. No definitive mass lesion is identified. Correlate with any prior  surgery as to the possibility of adhesions. The jejunum is nondilated. No other focal abnormality is seen. Electronically Signed   By: Inez Catalina M.D.   On: 02/18/2018 15:53   Dg Chest 2 View  Result Date: 02/17/2018 CLINICAL DATA:  History of pneumonia EXAM: CHEST - 2 VIEW COMPARISON:  02/16/2018. FINDINGS: Lung volumes with mild bibasilar atelectasis/infiltrates. No significant change from prior exam. No pleural effusion or pneumothorax. Heart size stable. IMPRESSION: Low lung volumes with mild bibasilar atelectasis/infiltrates. No significant change from prior exam. Electronically Signed   By: Marcello Moores  Register   On: 02/17/2018 12:23   Dg Abd 1 View  Result Date: 02/18/2018 CLINICAL DATA:  57 year old male status post nasogastric tube placement EXAM: ABDOMEN - 1 VIEW COMPARISON:  Prior abdominal radiograph obtained earlier today FINDINGS: Interval placement of a gastric tube. The tip of the tube overlies the gastric fundus. Persistent gaseous dilatation of numerous loops of small bowel in the abdomen consistent with small bowel obstruction. No acute osseous abnormality. IMPRESSION: The tip of the nasogastric tube overlies the gastric fundus. Electronically Signed   By: Jacqulynn Cadet M.D.   On: 02/18/2018 18:33   Ct Head Wo Contrast  Result Date: 02/16/2018 CLINICAL DATA:  Initial evaluation for acute altered mental status. EXAM: CT HEAD WITHOUT CONTRAST TECHNIQUE: Contiguous axial images were obtained from the base of the skull through the vertex without intravenous contrast. COMPARISON:  Prior CT from 05/05/2011. FINDINGS: Brain: Mild age-related cerebral atrophy with chronic small vessel ischemic disease. No acute intracranial hemorrhage. No acute large vessel territory infarct. No mass lesion, midline shift or mass effect. No hydrocephalus. No extra-axial fluid collection. Vascular: No hyperdense vessel. Skull: Scalp soft tissues and calvarium within normal limits. Sinuses/Orbits: Globes  and orbital soft tissues demonstrate no acute abnormality. Chronic left sphenoid sinusitis noted. Paranasal sinuses are otherwise largely clear. Trace opacity right mastoid air cells, of doubtful significance. Mastoid air cells otherwise clear. Other: None. IMPRESSION: 1. No acute intracranial abnormality. 2. Mild age-related cerebral atrophy with chronic small vessel ischemic disease. 3. Chronic left frontal sinusitis. Electronically Signed   By: Jeannine Boga M.D.   On: 02/16/2018 01:31   Dg Chest Port 1 View  Result Date: 02/19/2018 CLINICAL DATA:  Acute shortness of breath EXAM: PORTABLE CHEST 1 VIEW COMPARISON:  02/18/2018 and prior radiographs FINDINGS: The cardiomediastinal silhouette is unremarkable. Mild bibasilar atelectasis again noted. There is no evidence of focal airspace disease, pulmonary edema, suspicious pulmonary nodule/mass, pleural effusion, or pneumothorax. No acute bony abnormalities are identified. IMPRESSION: Unchanged appearance of the chest with mild bibasilar atelectasis. Electronically Signed   By: Margarette Canada M.D.   On: 02/19/2018 09:16   Dg Chest Port 1 View  Result Date: 02/18/2018 CLINICAL DATA:  Shortness of breath, weakness EXAM: PORTABLE CHEST 1 VIEW COMPARISON:  02/17/2018 FINDINGS: Low lung volumes. Bibasilar atelectasis. Heart is normal size. No effusions or acute bony abnormality. IMPRESSION: Low lung volumes, bibasilar atelectasis. Electronically Signed   By: Rolm Baptise M.D.   On: 02/18/2018 11:41   Dg Chest Port 1 View  Result Date: 02/16/2018 CLINICAL DATA:  Altered mental status for 1 day. EXAM: PORTABLE CHEST 1 VIEW COMPARISON:  November 01, 2017 FINDINGS: The  cardiomediastinal silhouette is normal. Mild opacity in left base. No other acute abnormalities. No change in the cardiomediastinal silhouette. IMPRESSION: Mild opacity in the left base may represent atelectasis or early infiltrate. Recommend clinical correlation. Electronically Signed   By:  Dorise Bullion III M.D   On: 02/16/2018 01:00   Dg Abd Portable 1v  Result Date: 02/20/2018 CLINICAL DATA:  Small-bowel obstruction. EXAM: PORTABLE ABDOMEN - 1 VIEW COMPARISON:  Plain films of the abdomen dated 02/19/2018 and 02/18/2018. FINDINGS: Continued gaseous distention of the small bowel loops throughout the upper and central abdomen. Stool and gas noted within the colon. No evidence of free intraperitoneal air, although the upper portion of the abdomen is excluded on this exam. IMPRESSION: Continued evidence of small bowel obstruction, presumably partial obstruction as there is gas and stool seen in the colon, not appreciably changed compared to the previous exams. Electronically Signed   By: Franki Cabot M.D.   On: 02/20/2018 09:26   Dg Abd Portable 1v  Result Date: 02/19/2018 CLINICAL DATA:  NG tube check EXAM: PORTABLE ABDOMEN - 1 VIEW COMPARISON:  February 19, 2018 FINDINGS: An NG tube terminates just to the left of midline in the upper abdomen, likely within the body of the stomach. Continued dilatation of small bowel loops. No other abnormalities. IMPRESSION: The NG tube terminates in the stomach. Continued small bowel obstruction. Electronically Signed   By: Dorise Bullion III M.D   On: 02/19/2018 23:41   Dg Abd Portable 1v  Result Date: 02/19/2018 CLINICAL DATA:  NG tube placement EXAM: PORTABLE ABDOMEN - 1 VIEW COMPARISON:  02/19/2018 FINDINGS: NG tube has been placed with the tip in the mid to distal stomach. Small bowel dilatation again noted compatible with small bowel obstruction. Large stool burden throughout the colon. No free air or organomegaly. IMPRESSION: NG tube tip in the mid to distal stomach. Continued small bowel obstruction pattern Large stool burden throughout the colon. Electronically Signed   By: Rolm Baptise M.D.   On: 02/19/2018 13:11   Dg Abd Portable 1v  Result Date: 02/19/2018 CLINICAL DATA:  Small bowel obstruction. EXAM: PORTABLE ABDOMEN - 1 VIEW  COMPARISON:  02/18/2018 FINDINGS: The enteric tube has been removed. Numerous gas-filled loops of dilated small bowel have not significantly changed. A moderate amount of stool is present in the colon. No acute osseous abnormality is seen. IMPRESSION: Interval removal of enteric tube. Unchanged small bowel dilatation consistent with obstruction. Electronically Signed   By: Logan Bores M.D.   On: 02/19/2018 09:00   Dg Abd Portable 1v  Result Date: 02/18/2018 CLINICAL DATA:  Abdominal pain, nausea, vomiting EXAM: PORTABLE ABDOMEN - 1 VIEW COMPARISON:  None FINDINGS: Increased stool in proximal half of colon. Paucity of distal colonic stool. Numerous minimally dilated air-filled loops of small bowel throughout the abdomen and pelvis, question ileus versus obstruction. No bowel wall thickening. Osseous structures unremarkable. Numerous pelvic phleboliths without definite urinary tract calcification. IMPRESSION: Increased stool throughout proximal half of colon. Air-filled distended small bowel loops throughout abdomen and pelvis, question ileus or obstruction; consider assessment by CT imaging with contrast. Electronically Signed   By: Lavonia Dana M.D.   On: 02/18/2018 10:24    Time Spent in minutes  30   Lala Lund M.D on 02/20/2018 at 10:05 AM  To page go to [email or web address] - password [email or web address]

## 2018-02-20 NOTE — Progress Notes (Signed)
[facility] Progress Note     Subjective: CC-  Patient much more awake and alert today. Begging for something to drink. States that he has zero abdominal pain. Denies n/v. Less bloated. No BM yesterday, may be passing a little flatus. Kept NG tube, output is low.  Objective: Vital signs in last 24 hours: Temp:  [97.6 F (36.4 C)-98.3 F (36.8 C)] 98 F (36.7 C) (11/15 0757) Pulse Rate:  [79] 79 (11/14 2045) Resp:  [15-21] 21 (11/15 0505) BP: (108-135)/(64-79) 135/64 (11/15 0505) SpO2:  [94 %-100 %] 100 % (11/15 0104) Last BM Date: 02/19/18  Intake/Output from previous day: 11/14 0701 - 11/15 0700 In: 2692.2 [I.V.:2369.7; IV Piggyback:322.4] Out: 2500 [Urine:2500] Intake/Output this shift: No intake/output data recorded.  PE: Gen:  Alert, NAD, pleasant HEENT: EOM's intact, pupils equal and round Pulm:  effort normal Abd: distended but very soft, +BS in all 4 quadrants, no HSM, no hernia, nontender Ext:  Calves soft and nontender Skin: no rashes noted, warm and dry  Lab Results:  Recent Labs    02/19/18 0706 02/20/18 0256  WBC 10.0 8.8  HGB 11.6* 11.5*  HCT 38.3* 37.0*  PLT 159 148*   BMET Recent Labs    02/19/18 0706 02/20/18 0256  NA 150* 146*  K 4.0 3.4*  CL 119* 113*  CO2 25 25  GLUCOSE 125* 125*  BUN 22* 18  CREATININE 1.76* 1.81*  CALCIUM 8.3* 8.3*   PT/INR No results for input(s): LABPROT, INR in the last 72 hours. CMP     Component Value Date/Time   NA 146 (H) 02/20/2018 0256   K 3.4 (L) 02/20/2018 0256   CL 113 (H) 02/20/2018 0256   CO2 25 02/20/2018 0256   GLUCOSE 125 (H) 02/20/2018 0256   BUN 18 02/20/2018 0256   CREATININE 1.81 (H) 02/20/2018 0256   CREATININE 1.91 (H) 07/21/2017 1120   CALCIUM 8.3 (L) 02/20/2018 0256   CALCIUM 8.6 05/09/2011 0614   PROT 6.1 (L) 02/20/2018 0256   ALBUMIN 2.5 (L) 02/20/2018 0256   AST 17 02/20/2018 0256   ALT 15 02/20/2018 0256   ALKPHOS 43 02/20/2018 0256   BILITOT 0.5 02/20/2018  0256   GFRNONAA 39 (L) 02/20/2018 0256   GFRNONAA 38 (L) 07/21/2017 1120   GFRAA 45 (L) 02/20/2018 0256   GFRAA 44 (L) 07/21/2017 1120   Lipase     Component Value Date/Time   LIPASE 35 07/23/2009 1720       Studies/Results: Ct Abdomen Pelvis Wo Contrast  Result Date: 02/18/2018 CLINICAL DATA:  Follow up small bowel obstruction EXAM: CT ABDOMEN AND PELVIS WITHOUT CONTRAST TECHNIQUE: Multidetector CT imaging of the abdomen and pelvis was performed following the standard protocol without IV contrast. COMPARISON:  None. FINDINGS: Lower chest: Lung bases are free of acute infiltrate or sizable effusion. Hepatobiliary: The liver and gallbladder are within normal limits. Pancreas: Unremarkable. No pancreatic ductal dilatation or surrounding inflammatory changes. Spleen: Normal in size without focal abnormality. Adrenals/Urinary Tract: Adrenal glands are within normal limits bilaterally. No renal calculi or obstructive changes are seen. The bladder is well distended. Stomach/Bowel: Colon demonstrates scattered fecal material throughout without obstructive change. The appendix is unremarkable and air filled. The proximal small bowel appears within normal limits although in the distal jejunum and entire ileum there is small-bowel dilatation identified. No significant fecalization of small bowel contents is noted. There is a relative transition zone identified in the distal ileum just prior to the ileocecal valve. No mass lesion  is seen. Correlation with any prior surgery is recommended as this may be related to adhesions. The stomach is within normal limits with the exception of a small sliding-type hiatal hernia. Vascular/Lymphatic: Aortic atherosclerosis. No enlarged abdominal or pelvic lymph nodes. Reproductive: Prostate is unremarkable. Other: No abdominal wall hernia or abnormality. No abdominopelvic ascites. Musculoskeletal: Degenerative changes of lumbar spine are seen. No acute bony abnormality is  noted. IMPRESSION: Distal small bowel obstruction involving the ileum just prior to the ileocecal valve. No definitive mass lesion is identified. Correlate with any prior surgery as to the possibility of adhesions. The jejunum is nondilated. No other focal abnormality is seen. Electronically Signed   By: Inez Catalina M.D.   On: 02/18/2018 15:53   Dg Abd 1 View  Result Date: 02/18/2018 CLINICAL DATA:  57 year old male status post nasogastric tube placement EXAM: ABDOMEN - 1 VIEW COMPARISON:  Prior abdominal radiograph obtained earlier today FINDINGS: Interval placement of a gastric tube. The tip of the tube overlies the gastric fundus. Persistent gaseous dilatation of numerous loops of small bowel in the abdomen consistent with small bowel obstruction. No acute osseous abnormality. IMPRESSION: The tip of the nasogastric tube overlies the gastric fundus. Electronically Signed   By: Jacqulynn Cadet M.D.   On: 02/18/2018 18:33   Dg Chest Port 1 View  Result Date: 02/19/2018 CLINICAL DATA:  Acute shortness of breath EXAM: PORTABLE CHEST 1 VIEW COMPARISON:  02/18/2018 and prior radiographs FINDINGS: The cardiomediastinal silhouette is unremarkable. Mild bibasilar atelectasis again noted. There is no evidence of focal airspace disease, pulmonary edema, suspicious pulmonary nodule/mass, pleural effusion, or pneumothorax. No acute bony abnormalities are identified. IMPRESSION: Unchanged appearance of the chest with mild bibasilar atelectasis. Electronically Signed   By: Margarette Canada M.D.   On: 02/19/2018 09:16   Dg Chest Port 1 View  Result Date: 02/18/2018 CLINICAL DATA:  Shortness of breath, weakness EXAM: PORTABLE CHEST 1 VIEW COMPARISON:  02/17/2018 FINDINGS: Low lung volumes. Bibasilar atelectasis. Heart is normal size. No effusions or acute bony abnormality. IMPRESSION: Low lung volumes, bibasilar atelectasis. Electronically Signed   By: Rolm Baptise M.D.   On: 02/18/2018 11:41   Dg Abd Portable  1v  Result Date: 02/19/2018 CLINICAL DATA:  NG tube check EXAM: PORTABLE ABDOMEN - 1 VIEW COMPARISON:  February 19, 2018 FINDINGS: An NG tube terminates just to the left of midline in the upper abdomen, likely within the body of the stomach. Continued dilatation of small bowel loops. No other abnormalities. IMPRESSION: The NG tube terminates in the stomach. Continued small bowel obstruction. Electronically Signed   By: Dorise Bullion III M.D   On: 02/19/2018 23:41   Dg Abd Portable 1v  Result Date: 02/19/2018 CLINICAL DATA:  NG tube placement EXAM: PORTABLE ABDOMEN - 1 VIEW COMPARISON:  02/19/2018 FINDINGS: NG tube has been placed with the tip in the mid to distal stomach. Small bowel dilatation again noted compatible with small bowel obstruction. Large stool burden throughout the colon. No free air or organomegaly. IMPRESSION: NG tube tip in the mid to distal stomach. Continued small bowel obstruction pattern Large stool burden throughout the colon. Electronically Signed   By: Rolm Baptise M.D.   On: 02/19/2018 13:11   Dg Abd Portable 1v  Result Date: 02/19/2018 CLINICAL DATA:  Small bowel obstruction. EXAM: PORTABLE ABDOMEN - 1 VIEW COMPARISON:  02/18/2018 FINDINGS: The enteric tube has been removed. Numerous gas-filled loops of dilated small bowel have not significantly changed. A moderate amount of stool is  present in the colon. No acute osseous abnormality is seen. IMPRESSION: Interval removal of enteric tube. Unchanged small bowel dilatation consistent with obstruction. Electronically Signed   By: Logan Bores M.D.   On: 02/19/2018 09:00   Dg Abd Portable 1v  Result Date: 02/18/2018 CLINICAL DATA:  Abdominal pain, nausea, vomiting EXAM: PORTABLE ABDOMEN - 1 VIEW COMPARISON:  None FINDINGS: Increased stool in proximal half of colon. Paucity of distal colonic stool. Numerous minimally dilated air-filled loops of small bowel throughout the abdomen and pelvis, question ileus versus obstruction.  No bowel wall thickening. Osseous structures unremarkable. Numerous pelvic phleboliths without definite urinary tract calcification. IMPRESSION: Increased stool throughout proximal half of colon. Air-filled distended small bowel loops throughout abdomen and pelvis, question ileus or obstruction; consider assessment by CT imaging with contrast. Electronically Signed   By: Lavonia Dana M.D.   On: 02/18/2018 10:24    Anti-infectives: Anti-infectives (From admission, onward)   Start     Dose/Rate Route Frequency Ordered Stop   02/17/18 1430  azithromycin (ZITHROMAX) 500 mg in sodium chloride 0.9 % 250 mL IVPB  Status:  Discontinued     500 mg 250 mL/hr over 60 Minutes Intravenous Every 24 hours 02/17/18 1258 02/18/18 1124   02/17/18 1400  cefTRIAXone (ROCEPHIN) 1 g in sodium chloride 0.9 % 100 mL IVPB  Status:  Discontinued     1 g 200 mL/hr over 30 Minutes Intravenous Every 24 hours 02/17/18 1258 02/18/18 1124   02/16/18 2300  azithromycin (ZITHROMAX) 500 mg in sodium chloride 0.9 % 250 mL IVPB  Status:  Discontinued     500 mg 250 mL/hr over 60 Minutes Intravenous Every 24 hours 02/16/18 0428 02/16/18 1522   02/16/18 2200  cefTRIAXone (ROCEPHIN) 2 g in sodium chloride 0.9 % 100 mL IVPB  Status:  Discontinued     2 g 200 mL/hr over 30 Minutes Intravenous Every 24 hours 02/16/18 0428 02/16/18 1525   02/16/18 1800  ciprofloxacin (CIPRO) tablet 500 mg  Status:  Discontinued     500 mg Oral 2 times daily 02/16/18 1525 02/17/18 1258   02/16/18 1200  bictegravir-emtricitabine-tenofovir AF (BIKTARVY) 50-200-25 MG per tablet 1 tablet     1 tablet Oral Daily 02/16/18 0428     02/16/18 0130  cefTRIAXone (ROCEPHIN) 2 g in sodium chloride 0.9 % 100 mL IVPB  Status:  Discontinued     2 g 200 mL/hr over 30 Minutes Intravenous Every 24 hours 02/16/18 0129 02/16/18 0431   02/16/18 0130  azithromycin (ZITHROMAX) 500 mg in sodium chloride 0.9 % 250 mL IVPB  Status:  Discontinued     500 mg 250 mL/hr over 60  Minutes Intravenous Every 24 hours 02/16/18 0129 02/16/18 0431       Assessment/Plan HIV DM-II H/o seizures CKD-4  Chronic constipation - take daily laxatives at home  SBO ?Ileus 2/2 Enteroaggregative E coli (+) stool - No prior h/o abdominal surgery - CT scan 11/13 reports distal small bowel obstruction involving the ileum just prior to the ileocecal valve - He was having diarrhea prior to admission - NG tube in place with low output   ID -none currently VTE -SCDs, sq heparin FEN -IVF, clamp NG tube, sips clear liquids Foley - placed for urinary retension Follow up -TBD  Plan: Clamp NG tube and allow sips of clears from the floor. Dulcolax suppository today as he has a h/o chronic constipation and significant stool burden in colon on xray. Mobilize.    LOS: 4 days  Wellington [place] , [facility] 02/20/2018, 8:48 AM Pager: [phone] Mon 7:00 am -11:30 AM Tues-Fri 7:00 am-4:30 pm Sat-Sun 7:00 am-11:30 am

## 2018-02-21 ENCOUNTER — Inpatient Hospital Stay (HOSPITAL_COMMUNITY): Payer: Medicare Other

## 2018-02-21 LAB — COMPREHENSIVE METABOLIC PANEL
ALT: 15 U/L (ref 0–44)
ANION GAP: 8 (ref 5–15)
AST: 19 U/L (ref 15–41)
Albumin: 2.5 g/dL — ABNORMAL LOW (ref 3.5–5.0)
Alkaline Phosphatase: 44 U/L (ref 38–126)
BUN: 15 mg/dL (ref 6–20)
CHLORIDE: 110 mmol/L (ref 98–111)
CO2: 23 mmol/L (ref 22–32)
Calcium: 8.1 mg/dL — ABNORMAL LOW (ref 8.9–10.3)
Creatinine, Ser: 1.64 mg/dL — ABNORMAL HIGH (ref 0.61–1.24)
GFR calc non Af Amer: 44 mL/min — ABNORMAL LOW (ref >60)
GFR, EST AFRICAN AMERICAN: 51 mL/min — AB (ref >60)
Glucose, Bld: 102 mg/dL — ABNORMAL HIGH (ref 70–99)
Potassium: 3.6 mmol/L (ref 3.5–5.1)
SODIUM: 141 mmol/L (ref 135–145)
Total Bilirubin: 0.3 mg/dL (ref 0.3–1.2)
Total Protein: 6 g/dL — ABNORMAL LOW (ref 6.5–8.1)

## 2018-02-21 LAB — CBC WITH DIFFERENTIAL/PLATELET
Abs Immature Granulocytes: 1.21 10*3/uL — ABNORMAL HIGH (ref 0.00–0.07)
BASOS ABS: 0 10*3/uL (ref 0.0–0.1)
Basophils Relative: 0 %
EOS ABS: 0.4 10*3/uL (ref 0.0–0.5)
Eosinophils Relative: 4 %
HEMATOCRIT: 34.9 % — AB (ref 39.0–52.0)
HEMOGLOBIN: 10.9 g/dL — AB (ref 13.0–17.0)
Immature Granulocytes: 14 %
LYMPHS ABS: 2.2 10*3/uL (ref 0.7–4.0)
LYMPHS PCT: 25 %
MCH: 28.9 pg (ref 26.0–34.0)
MCHC: 31.2 g/dL (ref 30.0–36.0)
MCV: 92.6 fL (ref 80.0–100.0)
MONOS PCT: 12 %
Monocytes Absolute: 1.1 10*3/uL — ABNORMAL HIGH (ref 0.1–1.0)
NEUTROS ABS: 4 10*3/uL (ref 1.7–7.7)
NRBC: 0.2 % (ref 0.0–0.2)
Neutrophils Relative %: 45 %
Platelets: 153 10*3/uL (ref 150–400)
RBC: 3.77 MIL/uL — ABNORMAL LOW (ref 4.22–5.81)
RDW: 15.2 % (ref 11.5–15.5)
WBC: 8.9 10*3/uL (ref 4.0–10.5)

## 2018-02-21 LAB — GLUCOSE, CAPILLARY
GLUCOSE-CAPILLARY: 115 mg/dL — AB (ref 70–99)
GLUCOSE-CAPILLARY: 116 mg/dL — AB (ref 70–99)
GLUCOSE-CAPILLARY: 162 mg/dL — AB (ref 70–99)
Glucose-Capillary: 110 mg/dL — ABNORMAL HIGH (ref 70–99)
Glucose-Capillary: 120 mg/dL — ABNORMAL HIGH (ref 70–99)
Glucose-Capillary: 123 mg/dL — ABNORMAL HIGH (ref 70–99)
Glucose-Capillary: 148 mg/dL — ABNORMAL HIGH (ref 70–99)

## 2018-02-21 MED ORDER — BISACODYL 10 MG RE SUPP
10.0000 mg | Freq: Every day | RECTAL | Status: DC
  Administered 2018-02-21: 10 mg via RECTAL
  Filled 2018-02-21: qty 1

## 2018-02-21 MED ORDER — DOCUSATE SODIUM 100 MG PO CAPS
100.0000 mg | ORAL_CAPSULE | Freq: Two times a day (BID) | ORAL | Status: DC
  Administered 2018-02-21 (×2): 100 mg via ORAL
  Filled 2018-02-21 (×2): qty 1

## 2018-02-21 MED ORDER — POLYETHYLENE GLYCOL 3350 17 G PO PACK
17.0000 g | PACK | Freq: Two times a day (BID) | ORAL | Status: DC
  Administered 2018-02-21 – 2018-02-23 (×6): 17 g via ORAL
  Filled 2018-02-21 (×7): qty 1

## 2018-02-21 MED ORDER — BISACODYL 10 MG RE SUPP
10.0000 mg | Freq: Every day | RECTAL | Status: DC
  Filled 2018-02-21: qty 1

## 2018-02-21 NOTE — Progress Notes (Signed)
PROGRESS NOTE                                                                                                                                                                                                             Patient Demographics:    Nathan [name], is a 57 y.o. male, DOB - 05-03-1960, KCL:[id number]  Admit date - 02/16/2018   Admitting Physician Vianne Bulls, MD  Outpatient Primary MD for the patient is Pavelock, Ralene Bathe, MD  LOS - 5  Chief Complaint  Patient presents with  . Altered Mental Status       Brief Narrative 57 year old male group home resident at [facility], H/O HIV on [place] seen in office 01/15/2018-CD4 750 viral load undetectable, Seizures with tonic-clonic movements- on Keppra and valproic acid, known history of chronic dysarthria, CKD 4, was brought in on 02/16/2018 with lethargy, diarrhea, fever elevated lactate levels, he was treated by the hospitalist team for 2 days and he was transferred to my care on 02/18/2018.   Subjective:   Patient in bed, appears comfortable, denies any headache, no fever, no chest pain or pressure, no shortness of breath , no abdominal pain no nausea. No focal weakness.    Assessment  & Plan :     1.  Metabolic and toxic encephalopathy.  Likely due to combination of diarrhea, dehydration, Hypernatremia and SBO.  Improved, unremarkable EEG, head CT unremarkable no focal neuro deficits hydration and supportive care mentation is improving.  He is now mildly lethargic but overall much improved.  2.  SBO.  Likely partial SBO versus severe ileus with a lot of stool burden.  General surgery following, initially had NG which has now been removed by general surgery, has been placed on Dulcolax suppository along with oral bowel regimen, clear liquids initiated by general surgery, will continue to monitor closely currently not nauseated and no abdominal pain..    3.  Urinary retention.  Continue Foley and  Flomax.  4.  History of seizures.  Switched his seizure medications to IV, stable EEG this admission.  5.  ARF on CKD 4.  Baseline creatinine is around 1.5.  Hydrate and monitor.  Also Foley placed for urinary retention.  Renal function is serially improving.  6.  E. coli diarrhea.  Discussed with ID physician Dr. De Burrs.  No need for antibiotics for now.  Question if this was just a colonizing bacteria.  7.  Dehydration with hypernatremia and hypokalemia.  Placed on D5W with potassium.  Improving will monitor.  8.  Dyslipidemia.  On statin continue.  9.  Mild atelectasis.  No pneumonia.  Incentive spirometry added.  Did have 2-3 episodes of emesis early morning on 02/18/2018 currently no signs of aspiration will continue to monitor clinically.  10. History of HIV.  Continue home medication last counts were controlled few months ago follows with Dr. Drucilla Schmidt.   Family Communication  :  None  Code Status :  None  Disposition Plan  :  Stepdown  Consults  :  CCS  Procedures  :    CT head.  Nonacute   EEG - no seizure activity  CT abdomen pelvis. - SBO    DVT Prophylaxis  :   Heparin    Lab Results  Component Value Date   PLT 153 02/21/2018    Diet :  Diet Order            Diet clear liquid Room service appropriate? Yes; Fluid consistency: Thin  Diet effective now               Inpatient Medications Scheduled Meds: . aspirin  81 mg Oral Daily  . atorvastatin  20 mg Oral q1800  . benztropine  1 mg Oral BID  . bictegravir-emtricitabine-tenofovir AF  1 tablet Oral Daily  . bisacodyl  10 mg Rectal Daily  . clonazePAM  0.5 mg Oral QHS  . docusate sodium  100 mg Oral BID  . famotidine  20 mg Oral BID  . folic acid  1 mg Oral Daily  . haloperidol  5 mg Oral Q supper  . heparin  5,000 Units Subcutaneous Q8H  . insulin aspart  0-9 Units Subcutaneous Q4H  . polyethylene glycol  17 g Oral BID  . sodium chloride flush  3 mL Intravenous Q12H  . tamsulosin  0.4 mg Oral  Daily   Continuous Infusions: . dextrose 5 % with kcl 75 mL/hr at 02/20/18 2300  . levETIRAcetam 500 mg (02/21/18 0211)  . valproate sodium 500 mg (02/20/18 1006)   And  . valproate sodium 1,000 mg (02/20/18 2305)   PRN Meds:.[DISCONTINUED] acetaminophen **OR** acetaminophen, [DISCONTINUED] ondansetron **OR** ondansetron (ZOFRAN) IV  Antibiotics  :   Anti-infectives (From admission, onward)   Start     Dose/Rate Route Frequency Ordered Stop   02/17/18 1430  azithromycin (ZITHROMAX) 500 mg in sodium chloride 0.9 % 250 mL IVPB  Status:  Discontinued     500 mg 250 mL/hr over 60 Minutes Intravenous Every 24 hours 02/17/18 1258 02/18/18 1124   02/17/18 1400  cefTRIAXone (ROCEPHIN) 1 g in sodium chloride 0.9 % 100 mL IVPB  Status:  Discontinued     1 g 200 mL/hr over 30 Minutes Intravenous Every 24 hours 02/17/18 1258 02/18/18 1124   02/16/18 2300  azithromycin (ZITHROMAX) 500 mg in sodium chloride 0.9 % 250 mL IVPB  Status:  Discontinued     500 mg 250 mL/hr over 60 Minutes Intravenous Every 24 hours 02/16/18 0428 02/16/18 1522   02/16/18 2200  cefTRIAXone (ROCEPHIN) 2 g in sodium chloride 0.9 % 100 mL IVPB  Status:  Discontinued     2 g 200 mL/hr over 30 Minutes Intravenous Every 24 hours 02/16/18 0428 02/16/18 1525   02/16/18 1800  ciprofloxacin (CIPRO) tablet 500 mg  Status:  Discontinued     500 mg Oral 2 times daily 02/16/18 1525 02/17/18 1258   02/16/18 1200  bictegravir-emtricitabine-tenofovir AF (BIKTARVY) 50-200-25 MG per tablet 1 tablet  1 tablet Oral Daily 02/16/18 0428     02/16/18 0130  cefTRIAXone (ROCEPHIN) 2 g in sodium chloride 0.9 % 100 mL IVPB  Status:  Discontinued     2 g 200 mL/hr over 30 Minutes Intravenous Every 24 hours 02/16/18 0129 02/16/18 0431   02/16/18 0130  azithromycin (ZITHROMAX) 500 mg in sodium chloride 0.9 % 250 mL IVPB  Status:  Discontinued     500 mg 250 mL/hr over 60 Minutes Intravenous Every 24 hours 02/16/18 0129 02/16/18 0431            Objective:   Vitals:   02/20/18 2001 02/21/18 0105 02/21/18 0700 02/21/18 0807  BP: 124/89 102/71 111/65   Pulse: 73     Resp: 15 18 19    Temp: 97.9 F (36.6 C) 97.9 F (36.6 C) 97.8 F (36.6 C) (!) 97.5 F (36.4 C)  TempSrc: Oral Oral Oral Oral  SpO2: 100% 100%    Weight:      Height:        Wt Readings from Last 3 Encounters:  02/19/18 85.4 kg  02/09/18 89.4 kg  01/15/18 88.9 kg     Intake/Output Summary (Last 24 hours) at 02/21/2018 0956 Last data filed at 02/21/2018 0900 Gross per 24 hour  Intake 2068.16 ml  Output 2650 ml  Net -581.84 ml     Physical Exam  Awake Alert,  No new F.N deficits, Normal affect [place].AT,PERRAL Supple Neck,No JVD, No cervical lymphadenopathy appriciated.  Symmetrical Chest wall movement, Good air movement bilaterally, CTAB RRR,No Gallops, Rubs or new Murmurs, No Parasternal Heave +ve B.Sounds, Abd Soft with mild distention, No tenderness, No organomegaly appriciated, No rebound - guarding or rigidity. Foley in place. No Cyanosis, Clubbing or edema, No new Rash or bruise      Data Review:    CBC Recent Labs  Lab 02/17/18 1322 02/18/18 0350 02/19/18 0706 02/20/18 0256 02/21/18 0350  WBC 10.6* 10.0 10.0 8.8 8.9  HGB 11.6* 11.5* 11.6* 11.5* 10.9*  HCT 37.6* 38.8* 38.3* 37.0* 34.9*  PLT PLATELET CLUMPS NOTED ON SMEAR, UNABLE TO ESTIMATE 120* 159 148* 153  MCV 92.8 93.0 93.2 92.7 92.6  MCH 28.6 27.6 28.2 28.8 28.9  MCHC 30.9 29.6* 30.3 31.1 31.2  RDW 14.7 14.7 15.1 15.1 15.2  LYMPHSABS 1.6 2.1 2.0 1.8 2.2  MONOABS 1.6* 1.3* 0.9 0.9 1.1*  EOSABS 0.0 0.1 0.2 0.3 0.4  BASOSABS 0.1 0.1 0.1 0.1 0.0    Chemistries  Recent Labs  Lab 02/16/18 0045 02/17/18 1322 02/18/18 0350 02/18/18 0824 02/19/18 0706 02/20/18 0256 02/21/18 0350  NA 140 147* 147*  --  150* 146* 141  K 4.2 3.6 3.6  --  4.0 3.4* 3.6  CL 103 116* 116*  --  119* 113* 110  CO2 24 28 26   --  25 25 23   GLUCOSE 183* 125* 128*  --  125* 125* 102*  BUN 21*  22* 20  --  22* 18 15  CREATININE 2.05* 1.80* 1.80*  --  1.76* 1.81* 1.64*  CALCIUM 9.0 8.7* 8.7*  --  8.3* 8.3* 8.1*  MG  --   --   --  2.5*  --  2.2  --   AST 24  --  25  --  18 17 19   ALT 17  --  19  --  14 15 15   ALKPHOS 52  --  38  --  43 43 44  BILITOT 0.6  --  0.5  --  0.3  0.5 0.3   ------------------------------------------------------------------------------------------------------------------ No results for input(s): CHOL, HDL, LDLCALC, TRIG, CHOLHDL, LDLDIRECT in the last 72 hours.  No results found for: HGBA1C ------------------------------------------------------------------------------------------------------------------ No results for input(s): TSH, T4TOTAL, T3FREE, THYROIDAB in the last 72 hours.  Invalid input(s): FREET3 ------------------------------------------------------------------------------------------------------------------ No results for input(s): VITAMINB12, FOLATE, FERRITIN, TIBC, IRON, RETICCTPCT in the last 72 hours.  Coagulation profile No results for input(s): INR, PROTIME in the last 168 hours.  No results for input(s): DDIMER in the last 72 hours.  Cardiac Enzymes No results for input(s): CKMB, TROPONINI, MYOGLOBIN in the last 168 hours.  Invalid input(s): CK ------------------------------------------------------------------------------------------------------------------ No results found for: BNP  Micro Results Recent Results (from the past 240 hour(s))  Culture, blood (Routine X 2) w Reflex to ID Panel     Status: None (Preliminary result)   Collection Time: 02/16/18 12:51 AM  Result Value Ref Range Status   Specimen Description BLOOD RIGHT ANTECUBITAL  Final   Special Requests   Final    BOTTLES DRAWN AEROBIC AND ANAEROBIC Blood Culture adequate volume   Culture   Final    NO GROWTH 4 DAYS Performed at [facility], [address], [place], [place] [address]    Report Status PENDING  Incomplete  Culture, blood (Routine X 2) w  Reflex to ID Panel     Status: None (Preliminary result)   Collection Time: 02/16/18  2:15 AM  Result Value Ref Range Status   Specimen Description BLOOD LEFT ANTECUBITAL  Final   Special Requests   Final    BOTTLES DRAWN AEROBIC AND ANAEROBIC Blood Culture adequate volume   Culture   Final    NO GROWTH 4 DAYS Performed at [facility], [place] [address] [place], [place] [address]    Report Status PENDING  Incomplete  Urine Culture     Status: None   Collection Time: 02/16/18  2:37 AM  Result Value Ref Range Status   Specimen Description URINE, RANDOM  Final   Special Requests NONE  Final   Culture   Final    NO GROWTH Performed at [facility], [place] [address] [place], [place] [address]    Report Status 02/17/2018 FINAL  Final  C difficile quick scan w PCR reflex     Status: None   Collection Time: 02/16/18  4:16 AM  Result Value Ref Range Status   C Diff antigen NEGATIVE NEGATIVE Final   C Diff toxin NEGATIVE NEGATIVE Final   C Diff interpretation No C. difficile detected.  Final    Comment: Performed at [facility], [place] [address], [place], [place] [address]  Gastrointestinal Panel by PCR , Stool     Status: Abnormal   Collection Time: 02/16/18  4:16 AM  Result Value Ref Range Status   Campylobacter species NOT DETECTED NOT DETECTED Final   Plesimonas shigelloides NOT DETECTED NOT DETECTED Final   Salmonella species NOT DETECTED NOT DETECTED Final   Yersinia enterocolitica NOT DETECTED NOT DETECTED Final   Vibrio species NOT DETECTED NOT DETECTED Final   Vibrio cholerae NOT DETECTED NOT DETECTED Final   Enteroaggregative E coli (EAEC) DETECTED (A) NOT DETECTED Final    Comment: RESULT CALLED TO, READ BACK BY AND VERIFIED WITH: AARON MCAULEY ON 02/16/18 AT 1450 KMP    Enteropathogenic E coli (EPEC) NOT DETECTED NOT DETECTED Final   Enterotoxigenic E coli (ETEC) NOT DETECTED NOT DETECTED Final   Shiga like toxin producing E coli (STEC) NOT DETECTED NOT  DETECTED Final   Shigella/Enteroinvasive E coli (EIEC) NOT DETECTED NOT DETECTED Final   Cryptosporidium NOT DETECTED NOT DETECTED Final   Cyclospora cayetanensis NOT DETECTED NOT DETECTED Final   Entamoeba histolytica NOT DETECTED NOT DETECTED Final   Giardia lamblia NOT DETECTED NOT DETECTED Final   Adenovirus F40/41 NOT DETECTED NOT DETECTED Final   Astrovirus NOT DETECTED NOT DETECTED Final   Norovirus GI/GII NOT DETECTED NOT DETECTED Final   Rotavirus A NOT DETECTED NOT DETECTED Final   Sapovirus (I, II, IV, and V) NOT DETECTED NOT DETECTED Final    Comment: Performed at [facility], [address] [place], [place] [address]  MRSA PCR Screening     Status: Abnormal   Collection Time: 02/16/18  2:31 PM  Result Value Ref Range Status   MRSA by PCR POSITIVE (A) NEGATIVE Final    Comment:        The GeneXpert MRSA Assay (FDA approved for NASAL specimens only), is one component of a comprehensive MRSA colonization surveillance program. It is not intended to diagnose MRSA infection nor to guide or monitor treatment for MRSA infections. RESULT CALLED TO, READ BACK BY AND VERIFIED WITHHeron Sabins RN 02/16/18 1834 JDW Performed at [facility], [place] [address] [place], [place] [address]   Culture, blood (routine x 2) Call MD if unable to obtain prior to antibiotics being given     Status: None (Preliminary result)   Collection Time: 02/17/18  1:00 PM  Result Value Ref Range Status   Specimen Description BLOOD LEFT ANTECUBITAL  Final   Special Requests   Final    BOTTLES DRAWN AEROBIC ONLY Blood Culture adequate volume   Culture   Final    NO GROWTH 4 DAYS Performed at [facility], [place] [address], [place], [place] [address]    Report Status PENDING  Incomplete  Culture, blood (routine x 2) Call MD if unable to obtain prior to antibiotics being given     Status: None (Preliminary result)   Collection Time: 02/17/18  1:20 PM  Result Value Ref Range Status     Specimen Description BLOOD RIGHT ANTECUBITAL  Final   Special Requests   Final    BOTTLES DRAWN AEROBIC ONLY Blood Culture adequate volume   Culture   Final    NO GROWTH 4 DAYS Performed at [facility], [place] [address] [place], [place] [address]    Report Status PENDING  Incomplete    Radiology Reports Ct Abdomen Pelvis Wo Contrast  Result Date: 02/18/2018 CLINICAL DATA:  Follow up small bowel obstruction EXAM: CT ABDOMEN AND PELVIS WITHOUT CONTRAST TECHNIQUE: Multidetector CT imaging of the abdomen and pelvis was performed following the standard protocol without IV contrast. COMPARISON:  None. FINDINGS: Lower chest: Lung bases are free of acute infiltrate or sizable effusion. Hepatobiliary: The liver and gallbladder are within normal limits. Pancreas: Unremarkable. No pancreatic ductal dilatation or surrounding inflammatory changes. Spleen: Normal in size without focal abnormality. Adrenals/Urinary Tract: Adrenal glands are within normal limits bilaterally. No renal calculi or obstructive changes are seen. The bladder is well distended. Stomach/Bowel: Colon demonstrates scattered fecal material throughout without obstructive change. The appendix is unremarkable and air filled. The proximal small bowel appears within normal limits although in the distal jejunum and entire ileum there is small-bowel dilatation identified. No significant fecalization of small bowel contents is noted. There is a relative transition zone identified in the distal ileum just prior to the ileocecal valve. No  mass lesion is seen. Correlation with any prior surgery is recommended as this may be related to adhesions. The stomach is within normal limits with the exception of a small sliding-type hiatal hernia. Vascular/Lymphatic: Aortic atherosclerosis. No enlarged abdominal or pelvic lymph nodes. Reproductive: Prostate is unremarkable. Other: No abdominal wall hernia or abnormality. No abdominopelvic ascites.  Musculoskeletal: Degenerative changes of lumbar spine are seen. No acute bony abnormality is noted. IMPRESSION: Distal small bowel obstruction involving the ileum just prior to the ileocecal valve. No definitive mass lesion is identified. Correlate with any prior surgery as to the possibility of adhesions. The jejunum is nondilated. No other focal abnormality is seen. Electronically Signed   By: Inez Catalina M.D.   On: 02/18/2018 15:53   Dg Chest 2 View  Result Date: 02/17/2018 CLINICAL DATA:  History of pneumonia EXAM: CHEST - 2 VIEW COMPARISON:  02/16/2018. FINDINGS: Lung volumes with mild bibasilar atelectasis/infiltrates. No significant change from prior exam. No pleural effusion or pneumothorax. Heart size stable. IMPRESSION: Low lung volumes with mild bibasilar atelectasis/infiltrates. No significant change from prior exam. Electronically Signed   By: Marcello Moores  Register   On: 02/17/2018 12:23   Dg Abd 1 View  Result Date: 02/18/2018 CLINICAL DATA:  57 year old male status post nasogastric tube placement EXAM: ABDOMEN - 1 VIEW COMPARISON:  Prior abdominal radiograph obtained earlier today FINDINGS: Interval placement of a gastric tube. The tip of the tube overlies the gastric fundus. Persistent gaseous dilatation of numerous loops of small bowel in the abdomen consistent with small bowel obstruction. No acute osseous abnormality. IMPRESSION: The tip of the nasogastric tube overlies the gastric fundus. Electronically Signed   By: Jacqulynn Cadet M.D.   On: 02/18/2018 18:33   Ct Head Wo Contrast  Result Date: 02/16/2018 CLINICAL DATA:  Initial evaluation for acute altered mental status. EXAM: CT HEAD WITHOUT CONTRAST TECHNIQUE: Contiguous axial images were obtained from the base of the skull through the vertex without intravenous contrast. COMPARISON:  Prior CT from 05/05/2011. FINDINGS: Brain: Mild age-related cerebral atrophy with chronic small vessel ischemic disease. No acute intracranial  hemorrhage. No acute large vessel territory infarct. No mass lesion, midline shift or mass effect. No hydrocephalus. No extra-axial fluid collection. Vascular: No hyperdense vessel. Skull: Scalp soft tissues and calvarium within normal limits. Sinuses/Orbits: Globes and orbital soft tissues demonstrate no acute abnormality. Chronic left sphenoid sinusitis noted. Paranasal sinuses are otherwise largely clear. Trace opacity right mastoid air cells, of doubtful significance. Mastoid air cells otherwise clear. Other: None. IMPRESSION: 1. No acute intracranial abnormality. 2. Mild age-related cerebral atrophy with chronic small vessel ischemic disease. 3. Chronic left frontal sinusitis. Electronically Signed   By: Jeannine Boga M.D.   On: 02/16/2018 01:31   Dg Chest Port 1 View  Result Date: 02/19/2018 CLINICAL DATA:  Acute shortness of breath EXAM: PORTABLE CHEST 1 VIEW COMPARISON:  02/18/2018 and prior radiographs FINDINGS: The cardiomediastinal silhouette is unremarkable. Mild bibasilar atelectasis again noted. There is no evidence of focal airspace disease, pulmonary edema, suspicious pulmonary nodule/mass, pleural effusion, or pneumothorax. No acute bony abnormalities are identified. IMPRESSION: Unchanged appearance of the chest with mild bibasilar atelectasis. Electronically Signed   By: Margarette Canada M.D.   On: 02/19/2018 09:16   Dg Chest Port 1 View  Result Date: 02/18/2018 CLINICAL DATA:  Shortness of breath, weakness EXAM: PORTABLE CHEST 1 VIEW COMPARISON:  02/17/2018 FINDINGS: Low lung volumes. Bibasilar atelectasis. Heart is normal size. No effusions or acute bony abnormality. IMPRESSION: Low lung volumes, bibasilar  atelectasis. Electronically Signed   By: Rolm Baptise M.D.   On: 02/18/2018 11:41   Dg Chest Port 1 View  Result Date: 02/16/2018 CLINICAL DATA:  Altered mental status for 1 day. EXAM: PORTABLE CHEST 1 VIEW COMPARISON:  November 01, 2017 FINDINGS: The cardiomediastinal silhouette  is normal. Mild opacity in left base. No other acute abnormalities. No change in the cardiomediastinal silhouette. IMPRESSION: Mild opacity in the left base may represent atelectasis or early infiltrate. Recommend clinical correlation. Electronically Signed   By: Dorise Bullion III M.D   On: 02/16/2018 01:00   Dg Abd Portable 1v  Result Date: 02/21/2018 CLINICAL DATA:  Follow up small bowel obstruction EXAM: PORTABLE ABDOMEN - 1 VIEW COMPARISON:  02/20/2018 FINDINGS: Scattered large and small bowel gas is noted. Considerable rounded retained fecal material is again noted within the colon. No free air is seen. Persistent small bowel dilatation is noted but slightly decreased when compared with the prior study. No bony abnormality is noted. IMPRESSION: Stable stool burden within the colon. Persistent but slightly improved small bowel dilatation. Electronically Signed   By: Inez Catalina M.D.   On: 02/21/2018 08:14   Dg Abd Portable 1v  Result Date: 02/20/2018 CLINICAL DATA:  Small-bowel obstruction. EXAM: PORTABLE ABDOMEN - 1 VIEW COMPARISON:  Plain films of the abdomen dated 02/19/2018 and 02/18/2018. FINDINGS: Continued gaseous distention of the small bowel loops throughout the upper and central abdomen. Stool and gas noted within the colon. No evidence of free intraperitoneal air, although the upper portion of the abdomen is excluded on this exam. IMPRESSION: Continued evidence of small bowel obstruction, presumably partial obstruction as there is gas and stool seen in the colon, not appreciably changed compared to the previous exams. Electronically Signed   By: Franki Cabot M.D.   On: 02/20/2018 09:26   Dg Abd Portable 1v  Result Date: 02/19/2018 CLINICAL DATA:  NG tube check EXAM: PORTABLE ABDOMEN - 1 VIEW COMPARISON:  February 19, 2018 FINDINGS: An NG tube terminates just to the left of midline in the upper abdomen, likely within the body of the stomach. Continued dilatation of small bowel loops.  No other abnormalities. IMPRESSION: The NG tube terminates in the stomach. Continued small bowel obstruction. Electronically Signed   By: Dorise Bullion III M.D   On: 02/19/2018 23:41   Dg Abd Portable 1v  Result Date: 02/19/2018 CLINICAL DATA:  NG tube placement EXAM: PORTABLE ABDOMEN - 1 VIEW COMPARISON:  02/19/2018 FINDINGS: NG tube has been placed with the tip in the mid to distal stomach. Small bowel dilatation again noted compatible with small bowel obstruction. Large stool burden throughout the colon. No free air or organomegaly. IMPRESSION: NG tube tip in the mid to distal stomach. Continued small bowel obstruction pattern Large stool burden throughout the colon. Electronically Signed   By: Rolm Baptise M.D.   On: 02/19/2018 13:11   Dg Abd Portable 1v  Result Date: 02/19/2018 CLINICAL DATA:  Small bowel obstruction. EXAM: PORTABLE ABDOMEN - 1 VIEW COMPARISON:  02/18/2018 FINDINGS: The enteric tube has been removed. Numerous gas-filled loops of dilated small bowel have not significantly changed. A moderate amount of stool is present in the colon. No acute osseous abnormality is seen. IMPRESSION: Interval removal of enteric tube. Unchanged small bowel dilatation consistent with obstruction. Electronically Signed   By: Logan Bores M.D.   On: 02/19/2018 09:00   Dg Abd Portable 1v  Result Date: 02/18/2018 CLINICAL DATA:  Abdominal pain, nausea, vomiting EXAM: PORTABLE ABDOMEN -  1 VIEW COMPARISON:  None FINDINGS: Increased stool in proximal half of colon. Paucity of distal colonic stool. Numerous minimally dilated air-filled loops of small bowel throughout the abdomen and pelvis, question ileus versus obstruction. No bowel wall thickening. Osseous structures unremarkable. Numerous pelvic phleboliths without definite urinary tract calcification. IMPRESSION: Increased stool throughout proximal half of colon. Air-filled distended small bowel loops throughout abdomen and pelvis, question ileus or  obstruction; consider assessment by CT imaging with contrast. Electronically Signed   By: Lavonia Dana M.D.   On: 02/18/2018 10:24    Time Spent in minutes  30   Lala Lund M.D on 02/21/2018 at 9:56 AM  To page go to [email or web address] - password [facility]

## 2018-02-21 NOTE — Progress Notes (Signed)
[facility] Progress Note     Subjective: CC-  NG clamped yesterday and it accidentally fell out over night. Patient has been sipping on clear liquids and tolerating well. Denies n/v. Denies abdominal pain. States that he's hungry. 1 BM yesterday with suppository, and 1 more last night. Xray still shows colon full of stool.  Objective: Vital signs in last 24 hours: Temp:  [97.5 F (36.4 C)-97.9 F (36.6 C)] 97.5 F (36.4 C) (11/16 0807) Pulse Rate:  [73] 73 (11/15 2001) Resp:  [13-19] 19 (11/16 0700) BP: (102-124)/(65-89) 111/65 (11/16 0700) SpO2:  [100 %] 100 % (11/16 0105) Last BM Date: 02/20/18  Intake/Output from previous day: 11/15 0701 - 11/16 0700 In: 1748.2 [P.O.:906; I.V.:604.2; IV Piggyback:238] Out: 2650 [Urine:2650] Intake/Output this shift: Total I/O In: 320 [P.O.:320] Out: -   PE: Gen:  Alert, NAD, pleasant HEENT: EOM's intact, pupils equal and round Pulm:  effort normal Abd: distended but very soft, +BS in all 4 quadrants, no HSM, no hernia, nontender Ext:  Calves soft and nontender Skin: no rashes noted, warm and dry  Lab Results:  Recent Labs    02/20/18 0256 02/21/18 0350  WBC 8.8 8.9  HGB 11.5* 10.9*  HCT 37.0* 34.9*  PLT 148* 153   BMET Recent Labs    02/20/18 0256 02/21/18 0350  NA 146* 141  K 3.4* 3.6  CL 113* 110  CO2 25 23  GLUCOSE 125* 102*  BUN 18 15  CREATININE 1.81* 1.64*  CALCIUM 8.3* 8.1*   PT/INR No results for input(s): LABPROT, INR in the last 72 hours. CMP     Component Value Date/Time   NA 141 02/21/2018 0350   K 3.6 02/21/2018 0350   CL 110 02/21/2018 0350   CO2 23 02/21/2018 0350   GLUCOSE 102 (H) 02/21/2018 0350   BUN 15 02/21/2018 0350   CREATININE 1.64 (H) 02/21/2018 0350   CREATININE 1.91 (H) 07/21/2017 1120   CALCIUM 8.1 (L) 02/21/2018 0350   CALCIUM 8.6 05/09/2011 0614   PROT 6.0 (L) 02/21/2018 0350   ALBUMIN 2.5 (L) 02/21/2018 0350   AST 19 02/21/2018 0350   ALT 15 02/21/2018  0350   ALKPHOS 44 02/21/2018 0350   BILITOT 0.3 02/21/2018 0350   GFRNONAA 44 (L) 02/21/2018 0350   GFRNONAA 38 (L) 07/21/2017 1120   GFRAA 51 (L) 02/21/2018 0350   GFRAA 44 (L) 07/21/2017 1120   Lipase     Component Value Date/Time   LIPASE 35 07/23/2009 1720       Studies/Results: Dg Abd Portable 1v  Result Date: 02/21/2018 CLINICAL DATA:  Follow up small bowel obstruction EXAM: PORTABLE ABDOMEN - 1 VIEW COMPARISON:  02/20/2018 FINDINGS: Scattered large and small bowel gas is noted. Considerable rounded retained fecal material is again noted within the colon. No free air is seen. Persistent small bowel dilatation is noted but slightly decreased when compared with the prior study. No bony abnormality is noted. IMPRESSION: Stable stool burden within the colon. Persistent but slightly improved small bowel dilatation. Electronically Signed   By: Inez Catalina M.D.   On: 02/21/2018 08:14   Dg Abd Portable 1v  Result Date: 02/20/2018 CLINICAL DATA:  Small-bowel obstruction. EXAM: PORTABLE ABDOMEN - 1 VIEW COMPARISON:  Plain films of the abdomen dated 02/19/2018 and 02/18/2018. FINDINGS: Continued gaseous distention of the small bowel loops throughout the upper and central abdomen. Stool and gas noted within the colon. No evidence of free intraperitoneal air, although the upper portion of the  abdomen is excluded on this exam. IMPRESSION: Continued evidence of small bowel obstruction, presumably partial obstruction as there is gas and stool seen in the colon, not appreciably changed compared to the previous exams. Electronically Signed   By: Franki Cabot M.D.   On: 02/20/2018 09:26   Dg Abd Portable 1v  Result Date: 02/19/2018 CLINICAL DATA:  NG tube check EXAM: PORTABLE ABDOMEN - 1 VIEW COMPARISON:  February 19, 2018 FINDINGS: An NG tube terminates just to the left of midline in the upper abdomen, likely within the body of the stomach. Continued dilatation of small bowel loops. No other  abnormalities. IMPRESSION: The NG tube terminates in the stomach. Continued small bowel obstruction. Electronically Signed   By: Dorise Bullion III M.D   On: 02/19/2018 23:41   Dg Abd Portable 1v  Result Date: 02/19/2018 CLINICAL DATA:  NG tube placement EXAM: PORTABLE ABDOMEN - 1 VIEW COMPARISON:  02/19/2018 FINDINGS: NG tube has been placed with the tip in the mid to distal stomach. Small bowel dilatation again noted compatible with small bowel obstruction. Large stool burden throughout the colon. No free air or organomegaly. IMPRESSION: NG tube tip in the mid to distal stomach. Continued small bowel obstruction pattern Large stool burden throughout the colon. Electronically Signed   By: Rolm Baptise M.D.   On: 02/19/2018 13:11    Anti-infectives: Anti-infectives (From admission, onward)   Start     Dose/Rate Route Frequency Ordered Stop   02/17/18 1430  azithromycin (ZITHROMAX) 500 mg in sodium chloride 0.9 % 250 mL IVPB  Status:  Discontinued     500 mg 250 mL/hr over 60 Minutes Intravenous Every 24 hours 02/17/18 1258 02/18/18 1124   02/17/18 1400  cefTRIAXone (ROCEPHIN) 1 g in sodium chloride 0.9 % 100 mL IVPB  Status:  Discontinued     1 g 200 mL/hr over 30 Minutes Intravenous Every 24 hours 02/17/18 1258 02/18/18 1124   02/16/18 2300  azithromycin (ZITHROMAX) 500 mg in sodium chloride 0.9 % 250 mL IVPB  Status:  Discontinued     500 mg 250 mL/hr over 60 Minutes Intravenous Every 24 hours 02/16/18 0428 02/16/18 1522   02/16/18 2200  cefTRIAXone (ROCEPHIN) 2 g in sodium chloride 0.9 % 100 mL IVPB  Status:  Discontinued     2 g 200 mL/hr over 30 Minutes Intravenous Every 24 hours 02/16/18 0428 02/16/18 1525   02/16/18 1800  ciprofloxacin (CIPRO) tablet 500 mg  Status:  Discontinued     500 mg Oral 2 times daily 02/16/18 1525 02/17/18 1258   02/16/18 1200  bictegravir-emtricitabine-tenofovir AF (BIKTARVY) 50-200-25 MG per tablet 1 tablet     1 tablet Oral Daily 02/16/18 0428      02/16/18 0130  cefTRIAXone (ROCEPHIN) 2 g in sodium chloride 0.9 % 100 mL IVPB  Status:  Discontinued     2 g 200 mL/hr over 30 Minutes Intravenous Every 24 hours 02/16/18 0129 02/16/18 0431   02/16/18 0130  azithromycin (ZITHROMAX) 500 mg in sodium chloride 0.9 % 250 mL IVPB  Status:  Discontinued     500 mg 250 mL/hr over 60 Minutes Intravenous Every 24 hours 02/16/18 0129 02/16/18 0431       Assessment/Plan HIV DM-II H/o seizures CKD-4  Chronic constipation - take daily laxatives at home  SBO ?Ileus 2/2 Enteroaggregative E coli (+) stool - No prior h/o abdominal surgery - CT scan 11/13 reports distal small bowel obstruction involving the ileum just prior to the ileocecal valve -  He was having diarrhea prior to admission - NG now out and patient tolerating sips of clears, having bowel function  ID -none currently VTE -SCDs, sq heparin FEN -IVF, CLD Foley - placed for urinary retension Follow up -TBD  Plan: Advance to clear liquids tray. Continue OOB/mobilization. Add oral bowel regimen with BID miralax and colace. Continue daily suppositories.   LOS: 5 days    Wellington [place] , [facility] 02/21/2018, 9:30 AM Pager: [phone] Mon 7:00 am -11:30 AM Tues-Fri 7:00 am-4:30 pm Sat-Sun 7:00 am-11:30 am

## 2018-02-22 ENCOUNTER — Inpatient Hospital Stay (HOSPITAL_COMMUNITY): Payer: Medicare Other

## 2018-02-22 LAB — COMPREHENSIVE METABOLIC PANEL
ALBUMIN: 2.4 g/dL — AB (ref 3.5–5.0)
ALT: 22 U/L (ref 0–44)
ANION GAP: 6 (ref 5–15)
AST: 29 U/L (ref 15–41)
Alkaline Phosphatase: 34 U/L — ABNORMAL LOW (ref 38–126)
BUN: 11 mg/dL (ref 6–20)
CO2: 27 mmol/L (ref 22–32)
Calcium: 8.4 mg/dL — ABNORMAL LOW (ref 8.9–10.3)
Chloride: 111 mmol/L (ref 98–111)
Creatinine, Ser: 1.71 mg/dL — ABNORMAL HIGH (ref 0.61–1.24)
GFR calc Af Amer: 49 mL/min — ABNORMAL LOW (ref >60)
GFR calc non Af Amer: 42 mL/min — ABNORMAL LOW (ref >60)
GLUCOSE: 114 mg/dL — AB (ref 70–99)
POTASSIUM: 3.7 mmol/L (ref 3.5–5.1)
Sodium: 144 mmol/L (ref 135–145)
TOTAL PROTEIN: 5.6 g/dL — AB (ref 6.5–8.1)
Total Bilirubin: 0.3 mg/dL (ref 0.3–1.2)

## 2018-02-22 LAB — CBC WITH DIFFERENTIAL/PLATELET
BASOS ABS: 0 10*3/uL (ref 0.0–0.1)
BASOS PCT: 0 %
BLASTS: 0 %
Band Neutrophils: 0 %
EOS ABS: 0.6 10*3/uL — AB (ref 0.0–0.5)
EOS PCT: 8 %
HEMATOCRIT: 32.2 % — AB (ref 39.0–52.0)
Hemoglobin: 10.1 g/dL — ABNORMAL LOW (ref 13.0–17.0)
LYMPHS PCT: 24 %
Lymphs Abs: 1.8 10*3/uL (ref 0.7–4.0)
MCH: 28.9 pg (ref 26.0–34.0)
MCHC: 31.4 g/dL (ref 30.0–36.0)
MCV: 92 fL (ref 80.0–100.0)
MONO ABS: 1.3 10*3/uL — AB (ref 0.1–1.0)
Metamyelocytes Relative: 3 %
Monocytes Relative: 17 %
Myelocytes: 2 %
NEUTROS ABS: 3.7 10*3/uL (ref 1.7–7.7)
NEUTROS PCT: 46 %
OTHER: 0 %
PROMYELOCYTES RELATIVE: 0 %
Platelets: 160 10*3/uL (ref 150–400)
RBC: 3.5 MIL/uL — ABNORMAL LOW (ref 4.22–5.81)
RDW: 14.9 % (ref 11.5–15.5)
WBC: 7.4 10*3/uL (ref 4.0–10.5)
nRBC: 0 % (ref 0.0–0.2)
nRBC: 2 /100 WBC — ABNORMAL HIGH

## 2018-02-22 LAB — CULTURE, BLOOD (ROUTINE X 2)
CULTURE: NO GROWTH
Culture: NO GROWTH
SPECIAL REQUESTS: ADEQUATE
Special Requests: ADEQUATE

## 2018-02-22 LAB — GLUCOSE, CAPILLARY
GLUCOSE-CAPILLARY: 114 mg/dL — AB (ref 70–99)
Glucose-Capillary: 149 mg/dL — ABNORMAL HIGH (ref 70–99)
Glucose-Capillary: 86 mg/dL (ref 70–99)
Glucose-Capillary: 130 mg/dL — ABNORMAL HIGH (ref 70–99)
Glucose-Capillary: 152 mg/dL — ABNORMAL HIGH (ref 70–99)

## 2018-02-22 LAB — MAGNESIUM: MAGNESIUM: 2.1 mg/dL (ref 1.7–2.4)

## 2018-02-22 MED ORDER — DOCUSATE SODIUM 100 MG PO CAPS
200.0000 mg | ORAL_CAPSULE | Freq: Two times a day (BID) | ORAL | Status: DC
  Administered 2018-02-22 – 2018-02-24 (×5): 200 mg via ORAL
  Filled 2018-02-22 (×5): qty 2

## 2018-02-22 NOTE — Progress Notes (Signed)
PROGRESS NOTE                                                                                                                                                                                                             Patient Demographics:    Ann Groeneveld, is a 57 y.o. male, DOB - February 08, 1961, RJJ:[id number]  Admit date - 02/16/2018   Admitting Physician Vianne Bulls, MD  Outpatient Primary MD for the patient is Pavelock, Ralene Bathe, MD  LOS - 6  Chief Complaint  Patient presents with  . Altered Mental Status       Brief Narrative 57 year old male group home resident at [facility], H/O HIV on [place] seen in office 01/15/2018-CD4 750 viral load undetectable, Seizures with tonic-clonic movements- on Keppra and valproic acid, known history of chronic dysarthria, CKD 4, was brought in on 02/16/2018 with lethargy, diarrhea, fever elevated lactate levels, he was treated by the hospitalist team for 2 days and he was transferred to my care on 02/18/2018.   Subjective:   Patient in bed, appears comfortable, denies any headache, no fever, no chest pain or pressure, no shortness of breath , no abdominal pain. No focal weakness.    Assessment  & Plan :     1.  Metabolic and toxic encephalopathy.  Likely due to combination of diarrhea, dehydration, Hypernatremia and SBO.  Improved, unremarkable EEG, head CT unremarkable no focal neuro deficits hydration and supportive care mentation is improving likely he is close to his baseline but does have underlying mental retardation.  2.  SBO.  Likely partial SBO versus severe ileus with a lot of stool burden.  By general surgery, initially required NG tube now placed on bowel regimen with regular bowel movements, abdominal distention much improved, nausea resolved tolerating soft diet, continue bowel regimen and monitor closely.    3.  Urinary retention.  Continue Foley and Flomax.  4.  History of seizures.  Switched his  seizure medications to IV, stable EEG this admission.  5.  ARF on CKD 4.  Baseline creatinine is around 1.6.  Hydrate and monitor.  Also Foley placed for urinary retention.  RF has resolved now close to baseline.  6.  E. coli diarrhea.  Discussed with ID physician Dr. De Burrs.  No need for antibiotics for now.  Question if this was just a colonizing bacteria.  7.  Dehydration with hypernatremia and hypokalemia.  Placed on D5W with potassium.  Improving will monitor.  8.  Dyslipidemia.  On statin continue.  9.  Mild atelectasis.  No pneumonia.  Incentive spirometry added.  Did have 2-3 episodes of emesis early morning on 02/18/2018 currently no signs of aspiration will continue to monitor clinically.  10. History of HIV.  Continue home medication last counts were controlled few months ago follows with Dr. Drucilla Schmidt.   Family Communication  :  None  Code Status :  None  Disposition Plan  :  Stepdown  Consults  :  CCS  Procedures  :    CT head.  Nonacute   EEG - no seizure activity  CT abdomen pelvis. - SBO    DVT Prophylaxis  :   Heparin    Lab Results  Component Value Date   PLT 160 02/22/2018    Diet :  Diet Order            Diet clear liquid Room service appropriate? Yes; Fluid consistency: Thin  Diet effective now               Inpatient Medications Scheduled Meds: . aspirin  81 mg Oral Daily  . atorvastatin  20 mg Oral q1800  . benztropine  1 mg Oral BID  . bictegravir-emtricitabine-tenofovir AF  1 tablet Oral Daily  . bisacodyl  10 mg Rectal Daily  . clonazePAM  0.5 mg Oral QHS  . docusate sodium  200 mg Oral BID  . famotidine  20 mg Oral BID  . folic acid  1 mg Oral Daily  . haloperidol  5 mg Oral Q supper  . heparin  5,000 Units Subcutaneous Q8H  . insulin aspart  0-9 Units Subcutaneous Q4H  . polyethylene glycol  17 g Oral BID  . sodium chloride flush  3 mL Intravenous Q12H  . tamsulosin  0.4 mg Oral Daily   Continuous Infusions: . levETIRAcetam  500 mg (02/22/18 0252)  . valproate sodium 500 mg (02/21/18 0957)   And  . valproate sodium 1,000 mg (02/21/18 2356)   PRN Meds:.[DISCONTINUED] acetaminophen **OR** acetaminophen, [DISCONTINUED] ondansetron **OR** ondansetron (ZOFRAN) IV  Antibiotics  :   Anti-infectives (From admission, onward)   Start     Dose/Rate Route Frequency Ordered Stop   02/17/18 1430  azithromycin (ZITHROMAX) 500 mg in sodium chloride 0.9 % 250 mL IVPB  Status:  Discontinued     500 mg 250 mL/hr over 60 Minutes Intravenous Every 24 hours 02/17/18 1258 02/18/18 1124   02/17/18 1400  cefTRIAXone (ROCEPHIN) 1 g in sodium chloride 0.9 % 100 mL IVPB  Status:  Discontinued     1 g 200 mL/hr over 30 Minutes Intravenous Every 24 hours 02/17/18 1258 02/18/18 1124   02/16/18 2300  azithromycin (ZITHROMAX) 500 mg in sodium chloride 0.9 % 250 mL IVPB  Status:  Discontinued     500 mg 250 mL/hr over 60 Minutes Intravenous Every 24 hours 02/16/18 0428 02/16/18 1522   02/16/18 2200  cefTRIAXone (ROCEPHIN) 2 g in sodium chloride 0.9 % 100 mL IVPB  Status:  Discontinued     2 g 200 mL/hr over 30 Minutes Intravenous Every 24 hours 02/16/18 0428 02/16/18 1525   02/16/18 1800  ciprofloxacin (CIPRO) tablet 500 mg  Status:  Discontinued     500 mg Oral 2 times daily 02/16/18 1525 02/17/18 1258   02/16/18 1200  bictegravir-emtricitabine-tenofovir AF (BIKTARVY) 50-200-25 MG per tablet 1 tablet     1 tablet Oral Daily 02/16/18 0428     02/16/18 0130  cefTRIAXone (ROCEPHIN) 2 g in sodium chloride  0.9 % 100 mL IVPB  Status:  Discontinued     2 g 200 mL/hr over 30 Minutes Intravenous Every 24 hours 02/16/18 0129 02/16/18 0431   02/16/18 0130  azithromycin (ZITHROMAX) 500 mg in sodium chloride 0.9 % 250 mL IVPB  Status:  Discontinued     500 mg 250 mL/hr over 60 Minutes Intravenous Every 24 hours 02/16/18 0129 02/16/18 0431          Objective:   Vitals:   02/21/18 2000 02/22/18 0000 02/22/18 0340 02/22/18 0809  BP: 111/65  117/63 122/66   Pulse: 75 74 75   Resp: 18 18 16    Temp: 97.6 F (36.4 C) 98.4 F (36.9 C) 98.4 F (36.9 C) 97.9 F (36.6 C)  TempSrc: Oral Oral Oral Oral  SpO2: 100% 100% 100% 99%  Weight:      Height:        Wt Readings from Last 3 Encounters:  02/19/18 85.4 kg  02/09/18 89.4 kg  01/15/18 88.9 kg     Intake/Output Summary (Last 24 hours) at 02/22/2018 0923 Last data filed at 02/22/2018 0700 Gross per 24 hour  Intake 2574 ml  Output 3400 ml  Net -826 ml     Physical Exam  Awake Alert, Oriented X 3, No new F.N deficits, Normal affect [place].AT,PERRAL Supple Neck,No JVD, No cervical lymphadenopathy appriciated.  Symmetrical Chest wall movement, Good air movement bilaterally, CTAB RRR,No Gallops, Rubs or new Murmurs, No Parasternal Heave +ve B.Sounds, Abd Soft & distention is much better, No tenderness, No organomegaly appriciated, No rebound - guarding or rigidity. Foley in place. No Cyanosis, Clubbing or edema, No new Rash or bruise    Data Review:    CBC Recent Labs  Lab 02/18/18 0350 02/19/18 0706 02/20/18 0256 02/21/18 0350 02/22/18 0624  WBC 10.0 10.0 8.8 8.9 7.4  HGB 11.5* 11.6* 11.5* 10.9* 10.1*  HCT 38.8* 38.3* 37.0* 34.9* 32.2*  PLT 120* 159 148* 153 160  MCV 93.0 93.2 92.7 92.6 92.0  MCH 27.6 28.2 28.8 28.9 28.9  MCHC 29.6* 30.3 31.1 31.2 31.4  RDW 14.7 15.1 15.1 15.2 14.9  LYMPHSABS 2.1 2.0 1.8 2.2 1.8  MONOABS 1.3* 0.9 0.9 1.1* 1.3*  EOSABS 0.1 0.2 0.3 0.4 0.6*  BASOSABS 0.1 0.1 0.1 0.0 0.0    Chemistries  Recent Labs  Lab 02/18/18 0350 02/18/18 0824 02/19/18 0706 02/20/18 0256 02/21/18 0350 02/22/18 0624  NA 147*  --  150* 146* 141 144  K 3.6  --  4.0 3.4* 3.6 3.7  CL 116*  --  119* 113* 110 111  CO2 26  --  25 25 23 27   GLUCOSE 128*  --  125* 125* 102* 114*  BUN 20  --  22* 18 15 11   CREATININE 1.80*  --  1.76* 1.81* 1.64* 1.71*  CALCIUM 8.7*  --  8.3* 8.3* 8.1* 8.4*  MG  --  2.5*  --  2.2  --  2.1  AST 25  --  18 17 19 29     ALT 19  --  14 15 15 22   ALKPHOS 38  --  43 43 44 34*  BILITOT 0.5  --  0.3 0.5 0.3 0.3   ------------------------------------------------------------------------------------------------------------------ No results for input(s): CHOL, HDL, LDLCALC, TRIG, CHOLHDL, LDLDIRECT in the last 72 hours.  No results found for: HGBA1C ------------------------------------------------------------------------------------------------------------------ No results for input(s): TSH, T4TOTAL, T3FREE, THYROIDAB in the last 72 hours.  Invalid input(s): FREET3 ------------------------------------------------------------------------------------------------------------------ No results for input(s): VITAMINB12, FOLATE, FERRITIN, TIBC,  IRON, RETICCTPCT in the last 72 hours.  Coagulation profile No results for input(s): INR, PROTIME in the last 168 hours.  No results for input(s): DDIMER in the last 72 hours.  Cardiac Enzymes No results for input(s): CKMB, TROPONINI, MYOGLOBIN in the last 168 hours.  Invalid input(s): CK ------------------------------------------------------------------------------------------------------------------ No results found for: BNP  Micro Results Recent Results (from the past 240 hour(s))  Culture, blood (Routine X 2) w Reflex to ID Panel     Status: None (Preliminary result)   Collection Time: 02/16/18 12:51 AM  Result Value Ref Range Status   Specimen Description BLOOD RIGHT ANTECUBITAL  Final   Special Requests   Final    BOTTLES DRAWN AEROBIC AND ANAEROBIC Blood Culture adequate volume   Culture   Final    NO GROWTH 4 DAYS Performed at [facility], [address], [place], [place] [address]    Report Status PENDING  Incomplete  Culture, blood (Routine X 2) w Reflex to ID Panel     Status: None (Preliminary result)   Collection Time: 02/16/18  2:15 AM  Result Value Ref Range Status   Specimen Description BLOOD LEFT ANTECUBITAL  Final   Special Requests    Final    BOTTLES DRAWN AEROBIC AND ANAEROBIC Blood Culture adequate volume   Culture   Final    NO GROWTH 4 DAYS Performed at [facility], [place] [address], [place], [place] [address]    Report Status PENDING  Incomplete  Urine Culture     Status: None   Collection Time: 02/16/18  2:37 AM  Result Value Ref Range Status   Specimen Description URINE, RANDOM  Final   Special Requests NONE  Final   Culture   Final    NO GROWTH Performed at [facility], [place] [address], [place], [place] [address]    Report Status 02/17/2018 FINAL  Final  C difficile quick scan w PCR reflex     Status: None   Collection Time: 02/16/18  4:16 AM  Result Value Ref Range Status   C Diff antigen NEGATIVE NEGATIVE Final   C Diff toxin NEGATIVE NEGATIVE Final   C Diff interpretation No C. difficile detected.  Final    Comment: Performed at [facility], [place] [address], [place],  [address]  Gastrointestinal Panel by PCR , Stool     Status: Abnormal   Collection Time: 02/16/18  4:16 AM  Result Value Ref Range Status   Campylobacter species NOT DETECTED NOT DETECTED Final   Plesimonas shigelloides NOT DETECTED NOT DETECTED Final   Salmonella species NOT DETECTED NOT DETECTED Final   Yersinia enterocolitica NOT DETECTED NOT DETECTED Final   Vibrio species NOT DETECTED NOT DETECTED Final   Vibrio cholerae NOT DETECTED NOT DETECTED Final   Enteroaggregative E coli (EAEC) DETECTED (A) NOT DETECTED Final    Comment: RESULT CALLED TO, READ BACK BY AND VERIFIED WITH: AARON MCAULEY ON 02/16/18 AT 1450 KMP    Enteropathogenic E coli (EPEC) NOT DETECTED NOT DETECTED Final   Enterotoxigenic E coli (ETEC) NOT DETECTED NOT DETECTED Final   Shiga like toxin producing E coli (STEC) NOT DETECTED NOT DETECTED Final   Shigella/Enteroinvasive E coli (EIEC) NOT DETECTED NOT DETECTED Final   Cryptosporidium NOT DETECTED NOT DETECTED Final   Cyclospora cayetanensis NOT DETECTED NOT DETECTED Final    Entamoeba histolytica NOT DETECTED NOT DETECTED Final   Giardia lamblia NOT DETECTED NOT DETECTED Final   Adenovirus F40/41  NOT DETECTED NOT DETECTED Final   Astrovirus NOT DETECTED NOT DETECTED Final   Norovirus GI/GII NOT DETECTED NOT DETECTED Final   Rotavirus A NOT DETECTED NOT DETECTED Final   Sapovirus (I, II, IV, and V) NOT DETECTED NOT DETECTED Final    Comment: Performed at [facility] [address] [place], [place] [address]  MRSA PCR Screening     Status: Abnormal   Collection Time: 02/16/18  2:31 PM  Result Value Ref Range Status   MRSA by PCR POSITIVE (A) NEGATIVE Final    Comment:        The GeneXpert MRSA Assay (FDA approved for NASAL specimens only), is one component of a comprehensive MRSA colonization surveillance program. It is not intended to diagnose MRSA infection nor to guide or monitor treatment for MRSA infections. RESULT CALLED TO, READ BACK BY AND VERIFIED WITHHeron Sabins RN 02/16/18 1834 JDW Performed at [facility], [place] [address] [place], [place] [address]   Culture, blood (routine x 2) Call MD if unable to obtain prior to antibiotics being given     Status: None (Preliminary result)   Collection Time: 02/17/18  1:00 PM  Result Value Ref Range Status   Specimen Description BLOOD LEFT ANTECUBITAL  Final   Special Requests   Final    BOTTLES DRAWN AEROBIC ONLY Blood Culture adequate volume   Culture   Final    NO GROWTH 4 DAYS Performed at [facility], [place] [address], [place], [place] [address]    Report Status PENDING  Incomplete  Culture, blood (routine x 2) Call MD if unable to obtain prior to antibiotics being given     Status: None (Preliminary result)   Collection Time: 02/17/18  1:20 PM  Result Value Ref Range Status   Specimen Description BLOOD RIGHT ANTECUBITAL  Final   Special Requests   Final    BOTTLES DRAWN AEROBIC ONLY Blood Culture adequate volume   Culture   Final    NO GROWTH 4 DAYS Performed at  [facility], [place] [address], [place], [place] [address]    Report Status PENDING  Incomplete    Radiology Reports Ct Abdomen Pelvis Wo Contrast  Result Date: 02/18/2018 CLINICAL DATA:  Follow up small bowel obstruction EXAM: CT ABDOMEN AND PELVIS WITHOUT CONTRAST TECHNIQUE: Multidetector CT imaging of the abdomen and pelvis was performed following the standard protocol without IV contrast. COMPARISON:  None. FINDINGS: Lower chest: Lung bases are free of acute infiltrate or sizable effusion. Hepatobiliary: The liver and gallbladder are within normal limits. Pancreas: Unremarkable. No pancreatic ductal dilatation or surrounding inflammatory changes. Spleen: Normal in size without focal abnormality. Adrenals/Urinary Tract: Adrenal glands are within normal limits bilaterally. No renal calculi or obstructive changes are seen. The bladder is well distended. Stomach/Bowel: Colon demonstrates scattered fecal material throughout without obstructive change. The appendix is unremarkable and air filled. The proximal small bowel appears within normal limits although in the distal jejunum and entire ileum there is small-bowel dilatation identified. No significant fecalization of small bowel contents is noted. There is a relative transition zone identified in the distal ileum just prior to the ileocecal valve. No mass lesion is seen. Correlation with any prior surgery is recommended as this may be related to adhesions. The stomach is within normal limits with the exception of a small sliding-type hiatal hernia. Vascular/Lymphatic: Aortic atherosclerosis. No enlarged abdominal or pelvic lymph nodes. Reproductive: Prostate is unremarkable. Other: No abdominal wall hernia or  abnormality. No abdominopelvic ascites. Musculoskeletal: Degenerative changes of lumbar spine are seen. No acute bony abnormality is noted. IMPRESSION: Distal small bowel obstruction involving the ileum just prior to the ileocecal valve. No  definitive mass lesion is identified. Correlate with any prior surgery as to the possibility of adhesions. The jejunum is nondilated. No other focal abnormality is seen. Electronically Signed   By: Inez Catalina M.D.   On: 02/18/2018 15:53   Dg Chest 2 View  Result Date: 02/17/2018 CLINICAL DATA:  History of pneumonia EXAM: CHEST - 2 VIEW COMPARISON:  02/16/2018. FINDINGS: Lung volumes with mild bibasilar atelectasis/infiltrates. No significant change from prior exam. No pleural effusion or pneumothorax. Heart size stable. IMPRESSION: Low lung volumes with mild bibasilar atelectasis/infiltrates. No significant change from prior exam. Electronically Signed   By: Marcello Moores  Register   On: 02/17/2018 12:23   Dg Abd 1 View  Result Date: 02/18/2018 CLINICAL DATA:  57 year old male status post nasogastric tube placement EXAM: ABDOMEN - 1 VIEW COMPARISON:  Prior abdominal radiograph obtained earlier today FINDINGS: Interval placement of a gastric tube. The tip of the tube overlies the gastric fundus. Persistent gaseous dilatation of numerous loops of small bowel in the abdomen consistent with small bowel obstruction. No acute osseous abnormality. IMPRESSION: The tip of the nasogastric tube overlies the gastric fundus. Electronically Signed   By: Jacqulynn Cadet M.D.   On: 02/18/2018 18:33   Ct Head Wo Contrast  Result Date: 02/16/2018 CLINICAL DATA:  Initial evaluation for acute altered mental status. EXAM: CT HEAD WITHOUT CONTRAST TECHNIQUE: Contiguous axial images were obtained from the base of the skull through the vertex without intravenous contrast. COMPARISON:  Prior CT from 05/05/2011. FINDINGS: Brain: Mild age-related cerebral atrophy with chronic small vessel ischemic disease. No acute intracranial hemorrhage. No acute large vessel territory infarct. No mass lesion, midline shift or mass effect. No hydrocephalus. No extra-axial fluid collection. Vascular: No hyperdense vessel. Skull: Scalp soft  tissues and calvarium within normal limits. Sinuses/Orbits: Globes and orbital soft tissues demonstrate no acute abnormality. Chronic left sphenoid sinusitis noted. Paranasal sinuses are otherwise largely clear. Trace opacity right mastoid air cells, of doubtful significance. Mastoid air cells otherwise clear. Other: None. IMPRESSION: 1. No acute intracranial abnormality. 2. Mild age-related cerebral atrophy with chronic small vessel ischemic disease. 3. Chronic left frontal sinusitis. Electronically Signed   By: Jeannine Boga M.D.   On: 02/16/2018 01:31   Dg Chest Port 1 View  Result Date: 02/19/2018 CLINICAL DATA:  Acute shortness of breath EXAM: PORTABLE CHEST 1 VIEW COMPARISON:  02/18/2018 and prior radiographs FINDINGS: The cardiomediastinal silhouette is unremarkable. Mild bibasilar atelectasis again noted. There is no evidence of focal airspace disease, pulmonary edema, suspicious pulmonary nodule/mass, pleural effusion, or pneumothorax. No acute bony abnormalities are identified. IMPRESSION: Unchanged appearance of the chest with mild bibasilar atelectasis. Electronically Signed   By: Margarette Canada M.D.   On: 02/19/2018 09:16   Dg Chest Port 1 View  Result Date: 02/18/2018 CLINICAL DATA:  Shortness of breath, weakness EXAM: PORTABLE CHEST 1 VIEW COMPARISON:  02/17/2018 FINDINGS: Low lung volumes. Bibasilar atelectasis. Heart is normal size. No effusions or acute bony abnormality. IMPRESSION: Low lung volumes, bibasilar atelectasis. Electronically Signed   By: Rolm Baptise M.D.   On: 02/18/2018 11:41   Dg Chest Port 1 View  Result Date: 02/16/2018 CLINICAL DATA:  Altered mental status for 1 day. EXAM: PORTABLE CHEST 1 VIEW COMPARISON:  November 01, 2017 FINDINGS: The cardiomediastinal silhouette is normal. Mild opacity  in left base. No other acute abnormalities. No change in the cardiomediastinal silhouette. IMPRESSION: Mild opacity in the left base may represent atelectasis or early  infiltrate. Recommend clinical correlation. Electronically Signed   By: Dorise Bullion III M.D   On: 02/16/2018 01:00   Dg Abd Portable 1v  Result Date: 02/22/2018 CLINICAL DATA:  Small-bowel obstruction, follow-up exam EXAM: PORTABLE ABDOMEN - 1 VIEW COMPARISON:  02/21/18 FINDINGS: Scattered large and small bowel gas is noted. A few loops of mildly dilated small bowel are again identified. Fecal material is again noted within the colon although improved from the prior exam. No free air is seen. No bony abnormality is noted. IMPRESSION: Slight improvement in the degree of retained fecal material within the colon. Persistent mild small bowel dilatation stable from the prior exam. Electronically Signed   By: Inez Catalina M.D.   On: 02/22/2018 08:13   Dg Abd Portable 1v  Result Date: 02/21/2018 CLINICAL DATA:  Follow up small bowel obstruction EXAM: PORTABLE ABDOMEN - 1 VIEW COMPARISON:  02/20/2018 FINDINGS: Scattered large and small bowel gas is noted. Considerable rounded retained fecal material is again noted within the colon. No free air is seen. Persistent small bowel dilatation is noted but slightly decreased when compared with the prior study. No bony abnormality is noted. IMPRESSION: Stable stool burden within the colon. Persistent but slightly improved small bowel dilatation. Electronically Signed   By: Inez Catalina M.D.   On: 02/21/2018 08:14   Dg Abd Portable 1v  Result Date: 02/20/2018 CLINICAL DATA:  Small-bowel obstruction. EXAM: PORTABLE ABDOMEN - 1 VIEW COMPARISON:  Plain films of the abdomen dated 02/19/2018 and 02/18/2018. FINDINGS: Continued gaseous distention of the small bowel loops throughout the upper and central abdomen. Stool and gas noted within the colon. No evidence of free intraperitoneal air, although the upper portion of the abdomen is excluded on this exam. IMPRESSION: Continued evidence of small bowel obstruction, presumably partial obstruction as there is gas and stool  seen in the colon, not appreciably changed compared to the previous exams. Electronically Signed   By: Franki Cabot M.D.   On: 02/20/2018 09:26   Dg Abd Portable 1v  Result Date: 02/19/2018 CLINICAL DATA:  NG tube check EXAM: PORTABLE ABDOMEN - 1 VIEW COMPARISON:  February 19, 2018 FINDINGS: An NG tube terminates just to the left of midline in the upper abdomen, likely within the body of the stomach. Continued dilatation of small bowel loops. No other abnormalities. IMPRESSION: The NG tube terminates in the stomach. Continued small bowel obstruction. Electronically Signed   By: Dorise Bullion III M.D   On: 02/19/2018 23:41   Dg Abd Portable 1v  Result Date: 02/19/2018 CLINICAL DATA:  NG tube placement EXAM: PORTABLE ABDOMEN - 1 VIEW COMPARISON:  02/19/2018 FINDINGS: NG tube has been placed with the tip in the mid to distal stomach. Small bowel dilatation again noted compatible with small bowel obstruction. Large stool burden throughout the colon. No free air or organomegaly. IMPRESSION: NG tube tip in the mid to distal stomach. Continued small bowel obstruction pattern Large stool burden throughout the colon. Electronically Signed   By: Rolm Baptise M.D.   On: 02/19/2018 13:11   Dg Abd Portable 1v  Result Date: 02/19/2018 CLINICAL DATA:  Small bowel obstruction. EXAM: PORTABLE ABDOMEN - 1 VIEW COMPARISON:  02/18/2018 FINDINGS: The enteric tube has been removed. Numerous gas-filled loops of dilated small bowel have not significantly changed. A moderate amount of stool is present in the  colon. No acute osseous abnormality is seen. IMPRESSION: Interval removal of enteric tube. Unchanged small bowel dilatation consistent with obstruction. Electronically Signed   By: Logan Bores M.D.   On: 02/19/2018 09:00   Dg Abd Portable 1v  Result Date: 02/18/2018 CLINICAL DATA:  Abdominal pain, nausea, vomiting EXAM: PORTABLE ABDOMEN - 1 VIEW COMPARISON:  None FINDINGS: Increased stool in proximal half of  colon. Paucity of distal colonic stool. Numerous minimally dilated air-filled loops of small bowel throughout the abdomen and pelvis, question ileus versus obstruction. No bowel wall thickening. Osseous structures unremarkable. Numerous pelvic phleboliths without definite urinary tract calcification. IMPRESSION: Increased stool throughout proximal half of colon. Air-filled distended small bowel loops throughout abdomen and pelvis, question ileus or obstruction; consider assessment by CT imaging with contrast. Electronically Signed   By: Lavonia Dana M.D.   On: 02/18/2018 10:24    Time Spent in minutes  30   Lala Lund M.D on 02/22/2018 at 9:23 AM  To page go to [email or web address] - password [email or web address]

## 2018-02-23 ENCOUNTER — Inpatient Hospital Stay (HOSPITAL_COMMUNITY): Payer: Medicare Other

## 2018-02-23 LAB — GLUCOSE, CAPILLARY
GLUCOSE-CAPILLARY: 122 mg/dL — AB (ref 70–99)
GLUCOSE-CAPILLARY: 95 mg/dL (ref 70–99)
GLUCOSE-CAPILLARY: 184 mg/dL — AB (ref 70–99)
Glucose-Capillary: 118 mg/dL — ABNORMAL HIGH (ref 70–99)
Glucose-Capillary: 162 mg/dL — ABNORMAL HIGH (ref 70–99)
Glucose-Capillary: 167 mg/dL — ABNORMAL HIGH (ref 70–99)

## 2018-02-23 MED ORDER — DIVALPROEX SODIUM 250 MG PO DR TAB
1000.0000 mg | DELAYED_RELEASE_TABLET | Freq: Every day | ORAL | Status: DC
  Administered 2018-02-23: 1000 mg via ORAL
  Filled 2018-02-23: qty 4

## 2018-02-23 MED ORDER — MAGNESIUM HYDROXIDE 400 MG/5ML PO SUSP
30.0000 mL | Freq: Two times a day (BID) | ORAL | Status: AC
  Administered 2018-02-23 (×2): 30 mL via ORAL
  Filled 2018-02-23 (×2): qty 30

## 2018-02-23 MED ORDER — LEVETIRACETAM 500 MG PO TABS
500.0000 mg | ORAL_TABLET | Freq: Two times a day (BID) | ORAL | Status: DC
  Administered 2018-02-23 – 2018-02-24 (×2): 500 mg via ORAL
  Filled 2018-02-23 (×2): qty 1

## 2018-02-23 MED ORDER — BOOST / RESOURCE BREEZE PO LIQD CUSTOM
1.0000 | Freq: Three times a day (TID) | ORAL | Status: DC
  Administered 2018-02-23 – 2018-02-24 (×4): 1 via ORAL

## 2018-02-23 MED ORDER — DIVALPROEX SODIUM 250 MG PO DR TAB
500.0000 mg | DELAYED_RELEASE_TABLET | Freq: Every day | ORAL | Status: DC
  Administered 2018-02-24: 500 mg via ORAL
  Filled 2018-02-23: qty 2

## 2018-02-23 MED ORDER — BISACODYL 10 MG RE SUPP
10.0000 mg | Freq: Two times a day (BID) | RECTAL | Status: DC
  Administered 2018-02-23 (×2): 10 mg via RECTAL
  Filled 2018-02-23 (×2): qty 1

## 2018-02-23 NOTE — Progress Notes (Signed)
PROGRESS NOTE                                                                                                                                                                                                             Patient Demographics:    Nathan [name], is a 57 y.o. male, DOB - 03-29-1961, CZY:[id number]  Admit date - 02/16/2018   Admitting Physician Vianne Bulls, MD  Outpatient Primary MD for the patient is Pavelock, Ralene Bathe, MD  LOS - 7  Chief Complaint  Patient presents with  . Altered Mental Status       Brief Narrative 57 year old male group home resident at [facility], H/O HIV on [place] seen in office 01/15/2018-CD4 750 viral load undetectable, Seizures with tonic-clonic movements- on Keppra and valproic acid, known history of chronic dysarthria, CKD 4, was brought in on 02/16/2018 with lethargy, diarrhea, fever elevated lactate levels, he was treated by the hospitalist team for 2 days and he was transferred to my care on 02/18/2018.   Subjective:   Patient in bed, appears comfortable, denies any headache, no fever, no chest pain or pressure, no shortness of breath , no abdominal pain. No focal weakness.   Assessment  & Plan :     1.  Metabolic and toxic encephalopathy.  Likely due to combination of diarrhea, dehydration, Hypernatremia and SBO.  Improved, unremarkable EEG, head CT unremarkable no focal neuro deficits hydration and supportive care mentation is improving likely he is close to his baseline but does have underlying mental retardation.  2.  SBO.  Likely partial SBO versus severe ileus with a lot of stool burden.  By general surgery, initially required NG tube now placed on bowel regimen with regular bowel movements, abdominal distention much improved, nausea resolved tolerating soft diet, continue bowel regimen and monitor closely.  If Stable likely discharge tomorrow.  3.  Urinary retention.  Got Foley and Flomax. DC foley  02/23/18.  4.  History of seizures.  Switched his seizure medications to IV, stable EEG this admission.  5.  ARF on CKD 4.  Baseline creatinine is around 1.6.  Hydrate and monitor.  Also Foley placed for urinary retention.  RF has resolved now close to baseline.  6.  E. coli diarrhea.  Discussed with ID physician Dr. De Burrs.  No need for antibiotics for now.  Question if this was just a colonizing bacteria.  7.  Dehydration with hypernatremia and hypokalemia.  Placed on D5W with potassium.  Improving will  monitor.  8.  Dyslipidemia.  On statin continue.  9.  Mild atelectasis.  No pneumonia.  Incentive spirometry added.  Did have 2-3 episodes of emesis early morning on 02/18/2018 currently no signs of aspiration will continue to monitor clinically.  10. History of HIV.  Continue home medication last counts were controlled few months ago follows with Dr. Drucilla Schmidt.   Family Communication  :  None  Code Status :  None  Disposition Plan  :  Stepdown  Consults  :  CCS  Procedures  :    CT head.  Nonacute   EEG - no seizure activity  CT abdomen pelvis. - SBO    DVT Prophylaxis  :   Heparin    Lab Results  Component Value Date   PLT 160 02/22/2018    Diet :  Diet Order            Diet clear liquid Room service appropriate? Yes; Fluid consistency: Thin  Diet effective now               Inpatient Medications Scheduled Meds: . aspirin  81 mg Oral Daily  . atorvastatin  20 mg Oral q1800  . benztropine  1 mg Oral BID  . bictegravir-emtricitabine-tenofovir AF  1 tablet Oral Daily  . bisacodyl  10 mg Rectal BID  . clonazePAM  0.5 mg Oral QHS  . docusate sodium  200 mg Oral BID  . famotidine  20 mg Oral BID  . feeding supplement  1 Container Oral TID BM  . folic acid  1 mg Oral Daily  . haloperidol  5 mg Oral Q supper  . heparin  5,000 Units Subcutaneous Q8H  . insulin aspart  0-9 Units Subcutaneous Q4H  . magnesium hydroxide  30 mL Oral BID  . polyethylene glycol  17  g Oral BID  . sodium chloride flush  3 mL Intravenous Q12H  . tamsulosin  0.4 mg Oral Daily   Continuous Infusions: . levETIRAcetam 500 mg (02/23/18 0348)  . valproate sodium 500 mg (02/22/18 1023)   And  . valproate sodium Stopped (02/23/18 0811)   PRN Meds:.[DISCONTINUED] acetaminophen **OR** acetaminophen, [DISCONTINUED] ondansetron **OR** ondansetron (ZOFRAN) IV  Antibiotics  :   Anti-infectives (From admission, onward)   Start     Dose/Rate Route Frequency Ordered Stop   02/17/18 1430  azithromycin (ZITHROMAX) 500 mg in sodium chloride 0.9 % 250 mL IVPB  Status:  Discontinued     500 mg 250 mL/hr over 60 Minutes Intravenous Every 24 hours 02/17/18 1258 02/18/18 1124   02/17/18 1400  cefTRIAXone (ROCEPHIN) 1 g in sodium chloride 0.9 % 100 mL IVPB  Status:  Discontinued     1 g 200 mL/hr over 30 Minutes Intravenous Every 24 hours 02/17/18 1258 02/18/18 1124   02/16/18 2300  azithromycin (ZITHROMAX) 500 mg in sodium chloride 0.9 % 250 mL IVPB  Status:  Discontinued     500 mg 250 mL/hr over 60 Minutes Intravenous Every 24 hours 02/16/18 0428 02/16/18 1522   02/16/18 2200  cefTRIAXone (ROCEPHIN) 2 g in sodium chloride 0.9 % 100 mL IVPB  Status:  Discontinued     2 g 200 mL/hr over 30 Minutes Intravenous Every 24 hours 02/16/18 0428 02/16/18 1525   02/16/18 1800  ciprofloxacin (CIPRO) tablet 500 mg  Status:  Discontinued     500 mg Oral 2 times daily 02/16/18 1525 02/17/18 1258   02/16/18 1200  bictegravir-emtricitabine-tenofovir AF (BIKTARVY) 50-200-25 MG per tablet 1 tablet  1 tablet Oral Daily 02/16/18 0428     02/16/18 0130  cefTRIAXone (ROCEPHIN) 2 g in sodium chloride 0.9 % 100 mL IVPB  Status:  Discontinued     2 g 200 mL/hr over 30 Minutes Intravenous Every 24 hours 02/16/18 0129 02/16/18 0431   02/16/18 0130  azithromycin (ZITHROMAX) 500 mg in sodium chloride 0.9 % 250 mL IVPB  Status:  Discontinued     500 mg 250 mL/hr over 60 Minutes Intravenous Every 24 hours  02/16/18 0129 02/16/18 0431          Objective:   Vitals:   02/22/18 2300 02/23/18 0031 02/23/18 0352 02/23/18 0759  BP:  (!) 145/81 121/65   Pulse:  (!) 106 81   Resp:  (!) 23 19   Temp: 97.9 F (36.6 C) 99.2 F (37.3 C) 98 F (36.7 C) 98.5 F (36.9 C)  TempSrc:  Axillary Axillary Axillary  SpO2:  97% 95%   Weight:      Height:        Wt Readings from Last 3 Encounters:  02/19/18 85.4 kg  02/09/18 89.4 kg  01/15/18 88.9 kg     Intake/Output Summary (Last 24 hours) at 02/23/2018 1101 Last data filed at 02/23/2018 1000 Gross per 24 hour  Intake 1486 ml  Output 3675 ml  Net -2189 ml     Physical Exam  Awake Alert,   No new F.N deficits, Normal affect .AT,PERRAL Supple Neck,No JVD, No cervical lymphadenopathy appriciated.  Symmetrical Chest wall movement, Good air movement bilaterally, CTAB RRR,No Gallops, Rubs or new Murmurs, No Parasternal Heave +ve B.Sounds, Abd Soft, No tenderness, No organomegaly appriciated, No rebound - guarding or rigidity. No Cyanosis, Clubbing or edema, No new Rash or bruise    Data Review:    CBC Recent Labs  Lab 02/18/18 0350 02/19/18 0706 02/20/18 0256 02/21/18 0350 02/22/18 0624  WBC 10.0 10.0 8.8 8.9 7.4  HGB 11.5* 11.6* 11.5* 10.9* 10.1*  HCT 38.8* 38.3* 37.0* 34.9* 32.2*  PLT 120* 159 148* 153 160  MCV 93.0 93.2 92.7 92.6 92.0  MCH 27.6 28.2 28.8 28.9 28.9  MCHC 29.6* 30.3 31.1 31.2 31.4  RDW 14.7 15.1 15.1 15.2 14.9  LYMPHSABS 2.1 2.0 1.8 2.2 1.8  MONOABS 1.3* 0.9 0.9 1.1* 1.3*  EOSABS 0.1 0.2 0.3 0.4 0.6*  BASOSABS 0.1 0.1 0.1 0.0 0.0    Chemistries  Recent Labs  Lab 02/18/18 0350 02/18/18 0824 02/19/18 0706 02/20/18 0256 02/21/18 0350 02/22/18 0624  NA 147*  --  150* 146* 141 144  K 3.6  --  4.0 3.4* 3.6 3.7  CL 116*  --  119* 113* 110 111  CO2 26  --  25 25 23 27   GLUCOSE 128*  --  125* 125* 102* 114*  BUN 20  --  22* 18 15 11   CREATININE 1.80*  --  1.76* 1.81* 1.64* 1.71*  CALCIUM 8.7*   --  8.3* 8.3* 8.1* 8.4*  MG  --  2.5*  --  2.2  --  2.1  AST 25  --  18 17 19 29   ALT 19  --  14 15 15 22   ALKPHOS 38  --  43 43 44 34*  BILITOT 0.5  --  0.3 0.5 0.3 0.3   ------------------------------------------------------------------------------------------------------------------ No results for input(s): CHOL, HDL, LDLCALC, TRIG, CHOLHDL, LDLDIRECT in the last 72 hours.  No results found for: HGBA1C ------------------------------------------------------------------------------------------------------------------ No results for input(s): TSH, T4TOTAL, T3FREE, THYROIDAB in the last 72 hours.  Invalid input(s): FREET3 ------------------------------------------------------------------------------------------------------------------ No results for input(s): VITAMINB12, FOLATE, FERRITIN, TIBC, IRON, RETICCTPCT in the last 72 hours.  Coagulation profile No results for input(s): INR, PROTIME in the last 168 hours.  No results for input(s): DDIMER in the last 72 hours.  Cardiac Enzymes No results for input(s): CKMB, TROPONINI, MYOGLOBIN in the last 168 hours.  Invalid input(s): CK ------------------------------------------------------------------------------------------------------------------ No results found for: BNP  Micro Results Recent Results (from the past 240 hour(s))  Culture, blood (Routine X 2) w Reflex to ID Panel     Status: None (Preliminary result)   Collection Time: 02/16/18 12:51 AM  Result Value Ref Range Status   Specimen Description BLOOD RIGHT ANTECUBITAL  Final   Special Requests   Final    BOTTLES DRAWN AEROBIC AND ANAEROBIC Blood Culture adequate volume   Culture   Final    NO GROWTH 4 DAYS Performed at [facility], [address], [place], [place] [address]    Report Status PENDING  Incomplete  Culture, blood (Routine X 2) w Reflex to ID Panel     Status: None (Preliminary result)   Collection Time: 02/16/18  2:15 AM  Result Value Ref Range  Status   Specimen Description BLOOD LEFT ANTECUBITAL  Final   Special Requests   Final    BOTTLES DRAWN AEROBIC AND ANAEROBIC Blood Culture adequate volume   Culture   Final    NO GROWTH 4 DAYS Performed at [facility], [place] [address], [place], [place] [address]    Report Status PENDING  Incomplete  Urine Culture     Status: None   Collection Time: 02/16/18  2:37 AM  Result Value Ref Range Status   Specimen Description URINE, RANDOM  Final   Special Requests NONE  Final   Culture   Final    NO GROWTH Performed at [facility], [place] [address], [place], [place] [address]    Report Status 02/17/2018 FINAL  Final  C difficile quick scan w PCR reflex     Status: None   Collection Time: 02/16/18  4:16 AM  Result Value Ref Range Status   C Diff antigen NEGATIVE NEGATIVE Final   C Diff toxin NEGATIVE NEGATIVE Final   C Diff interpretation No C. difficile detected.  Final    Comment: Performed at [facility], [place] [address] [place], [place] [address]  Gastrointestinal Panel by PCR , Stool     Status: Abnormal   Collection Time: 02/16/18  4:16 AM  Result Value Ref Range Status   Campylobacter species NOT DETECTED NOT DETECTED Final   Plesimonas shigelloides NOT DETECTED NOT DETECTED Final   Salmonella species NOT DETECTED NOT DETECTED Final   Yersinia enterocolitica NOT DETECTED NOT DETECTED Final   Vibrio species NOT DETECTED NOT DETECTED Final   Vibrio cholerae NOT DETECTED NOT DETECTED Final   Enteroaggregative E coli (EAEC) DETECTED (A) NOT DETECTED Final    Comment: RESULT CALLED TO, READ BACK BY AND VERIFIED WITH: AARON MCAULEY ON 02/16/18 AT 1450 KMP    Enteropathogenic E coli (EPEC) NOT DETECTED NOT DETECTED Final   Enterotoxigenic E coli (ETEC) NOT DETECTED NOT DETECTED Final   Shiga like toxin producing E coli (STEC) NOT DETECTED NOT DETECTED Final   Shigella/Enteroinvasive E coli (EIEC) NOT DETECTED NOT DETECTED Final   Cryptosporidium NOT DETECTED NOT  DETECTED Final   Cyclospora cayetanensis NOT DETECTED NOT DETECTED Final   Entamoeba histolytica NOT DETECTED NOT DETECTED Final  Giardia lamblia NOT DETECTED NOT DETECTED Final   Adenovirus F40/41 NOT DETECTED NOT DETECTED Final   Astrovirus NOT DETECTED NOT DETECTED Final   Norovirus GI/GII NOT DETECTED NOT DETECTED Final   Rotavirus A NOT DETECTED NOT DETECTED Final   Sapovirus (I, II, IV, and V) NOT DETECTED NOT DETECTED Final    Comment: Performed at [facility], [address] [place], [place] [address]  MRSA PCR Screening     Status: Abnormal   Collection Time: 02/16/18  2:31 PM  Result Value Ref Range Status   MRSA by PCR POSITIVE (A) NEGATIVE Final    Comment:        The GeneXpert MRSA Assay (FDA approved for NASAL specimens only), is one component of a comprehensive MRSA colonization surveillance program. It is not intended to diagnose MRSA infection nor to guide or monitor treatment for MRSA infections. RESULT CALLED TO, READ BACK BY AND VERIFIED WITHHeron Sabins RN 02/16/18 1834 JDW Performed at [facility], [place] [address], [place], [place] [address]   Culture, blood (routine x 2) Call MD if unable to obtain prior to antibiotics being given     Status: None   Collection Time: 02/17/18  1:00 PM  Result Value Ref Range Status   Specimen Description BLOOD LEFT ANTECUBITAL  Final   Special Requests   Final    BOTTLES DRAWN AEROBIC ONLY Blood Culture adequate volume   Culture   Final    NO GROWTH 5 DAYS Performed at [facility], [place] [address] [place], [place] [address]    Report Status 02/22/2018 FINAL  Final  Culture, blood (routine x 2) Call MD if unable to obtain prior to antibiotics being given     Status: None   Collection Time: 02/17/18  1:20 PM  Result Value Ref Range Status   Specimen Description BLOOD RIGHT ANTECUBITAL  Final   Special Requests   Final    BOTTLES DRAWN AEROBIC ONLY Blood Culture adequate volume   Culture   Final     NO GROWTH 5 DAYS Performed at [facility], [place] [address] [place], [place] [address]    Report Status 02/22/2018 FINAL  Final    Radiology Reports Ct Abdomen Pelvis Wo Contrast  Result Date: 02/18/2018 CLINICAL DATA:  Follow up small bowel obstruction EXAM: CT ABDOMEN AND PELVIS WITHOUT CONTRAST TECHNIQUE: Multidetector CT imaging of the abdomen and pelvis was performed following the standard protocol without IV contrast. COMPARISON:  None. FINDINGS: Lower chest: Lung bases are free of acute infiltrate or sizable effusion. Hepatobiliary: The liver and gallbladder are within normal limits. Pancreas: Unremarkable. No pancreatic ductal dilatation or surrounding inflammatory changes. Spleen: Normal in size without focal abnormality. Adrenals/Urinary Tract: Adrenal glands are within normal limits bilaterally. No renal calculi or obstructive changes are seen. The bladder is well distended. Stomach/Bowel: Colon demonstrates scattered fecal material throughout without obstructive change. The appendix is unremarkable and air filled. The proximal small bowel appears within normal limits although in the distal jejunum and entire ileum there is small-bowel dilatation identified. No significant fecalization of small bowel contents is noted. There is a relative transition zone identified in the distal ileum just prior to the ileocecal valve. No mass lesion is seen. Correlation with any prior surgery is recommended as this may be related to adhesions. The stomach is within normal limits with the exception of a small sliding-type hiatal hernia. Vascular/Lymphatic: Aortic atherosclerosis. No enlarged abdominal or pelvic lymph nodes. Reproductive:  Prostate is unremarkable. Other: No abdominal wall hernia or abnormality. No abdominopelvic ascites. Musculoskeletal: Degenerative changes of lumbar spine are seen. No acute bony abnormality is noted. IMPRESSION: Distal small bowel obstruction involving the ileum just  prior to the ileocecal valve. No definitive mass lesion is identified. Correlate with any prior surgery as to the possibility of adhesions. The jejunum is nondilated. No other focal abnormality is seen. Electronically Signed   By: Inez Catalina M.D.   On: 02/18/2018 15:53   Dg Chest 2 View  Result Date: 02/17/2018 CLINICAL DATA:  History of pneumonia EXAM: CHEST - 2 VIEW COMPARISON:  02/16/2018. FINDINGS: Lung volumes with mild bibasilar atelectasis/infiltrates. No significant change from prior exam. No pleural effusion or pneumothorax. Heart size stable. IMPRESSION: Low lung volumes with mild bibasilar atelectasis/infiltrates. No significant change from prior exam. Electronically Signed   By: Marcello Moores  Register   On: 02/17/2018 12:23   Dg Abd 1 View  Result Date: 02/18/2018 CLINICAL DATA:  57 year old male status post nasogastric tube placement EXAM: ABDOMEN - 1 VIEW COMPARISON:  Prior abdominal radiograph obtained earlier today FINDINGS: Interval placement of a gastric tube. The tip of the tube overlies the gastric fundus. Persistent gaseous dilatation of numerous loops of small bowel in the abdomen consistent with small bowel obstruction. No acute osseous abnormality. IMPRESSION: The tip of the nasogastric tube overlies the gastric fundus. Electronically Signed   By: Jacqulynn Cadet M.D.   On: 02/18/2018 18:33   Ct Head Wo Contrast  Result Date: 02/16/2018 CLINICAL DATA:  Initial evaluation for acute altered mental status. EXAM: CT HEAD WITHOUT CONTRAST TECHNIQUE: Contiguous axial images were obtained from the base of the skull through the vertex without intravenous contrast. COMPARISON:  Prior CT from 05/05/2011. FINDINGS: Brain: Mild age-related cerebral atrophy with chronic small vessel ischemic disease. No acute intracranial hemorrhage. No acute large vessel territory infarct. No mass lesion, midline shift or mass effect. No hydrocephalus. No extra-axial fluid collection. Vascular: No hyperdense  vessel. Skull: Scalp soft tissues and calvarium within normal limits. Sinuses/Orbits: Globes and orbital soft tissues demonstrate no acute abnormality. Chronic left sphenoid sinusitis noted. Paranasal sinuses are otherwise largely clear. Trace opacity right mastoid air cells, of doubtful significance. Mastoid air cells otherwise clear. Other: None. IMPRESSION: 1. No acute intracranial abnormality. 2. Mild age-related cerebral atrophy with chronic small vessel ischemic disease. 3. Chronic left frontal sinusitis. Electronically Signed   By: Jeannine Boga M.D.   On: 02/16/2018 01:31   Dg Chest Port 1 View  Result Date: 02/19/2018 CLINICAL DATA:  Acute shortness of breath EXAM: PORTABLE CHEST 1 VIEW COMPARISON:  02/18/2018 and prior radiographs FINDINGS: The cardiomediastinal silhouette is unremarkable. Mild bibasilar atelectasis again noted. There is no evidence of focal airspace disease, pulmonary edema, suspicious pulmonary nodule/mass, pleural effusion, or pneumothorax. No acute bony abnormalities are identified. IMPRESSION: Unchanged appearance of the chest with mild bibasilar atelectasis. Electronically Signed   By: Margarette Canada M.D.   On: 02/19/2018 09:16   Dg Chest Port 1 View  Result Date: 02/18/2018 CLINICAL DATA:  Shortness of breath, weakness EXAM: PORTABLE CHEST 1 VIEW COMPARISON:  02/17/2018 FINDINGS: Low lung volumes. Bibasilar atelectasis. Heart is normal size. No effusions or acute bony abnormality. IMPRESSION: Low lung volumes, bibasilar atelectasis. Electronically Signed   By: Rolm Baptise M.D.   On: 02/18/2018 11:41   Dg Chest Port 1 View  Result Date: 02/16/2018 CLINICAL DATA:  Altered mental status for 1 day. EXAM: PORTABLE CHEST 1 VIEW COMPARISON:  November 01, 2017 FINDINGS: The cardiomediastinal silhouette is normal. Mild opacity in left base. No other acute abnormalities. No change in the cardiomediastinal silhouette. IMPRESSION: Mild opacity in the left base may represent  atelectasis or early infiltrate. Recommend clinical correlation. Electronically Signed   By: Dorise Bullion III M.D   On: 02/16/2018 01:00   Dg Abd Portable 1v  Result Date: 02/23/2018 CLINICAL DATA:  Follow-up small bowel obstruction. EXAM: PORTABLE ABDOMEN - 1 VIEW COMPARISON:  Abdominal radiographs of February 22, 2018 FINDINGS: There is a moderate amount of gas within mildly distended small bowel loops and normal caliber large bowel loops. There is a moderate amount of stool in the left colon and rectum. No free extraluminal gas collections are observed. The bony structures are unremarkable. There are numerous pelvic phleboliths. IMPRESSION: Fairly stable appearance of the abdomen with findings which may reflect a generalized ileus or partial small bowel obstruction. A moderate amount of stool remains in the left colon and rectum. Electronically Signed   By: David  Martinique M.D.   On: 02/23/2018 07:31   Dg Abd Portable 1v  Result Date: 02/22/2018 CLINICAL DATA:  Small-bowel obstruction, follow-up exam EXAM: PORTABLE ABDOMEN - 1 VIEW COMPARISON:  02/21/18 FINDINGS: Scattered large and small bowel gas is noted. A few loops of mildly dilated small bowel are again identified. Fecal material is again noted within the colon although improved from the prior exam. No free air is seen. No bony abnormality is noted. IMPRESSION: Slight improvement in the degree of retained fecal material within the colon. Persistent mild small bowel dilatation stable from the prior exam. Electronically Signed   By: Inez Catalina M.D.   On: 02/22/2018 08:13   Dg Abd Portable 1v  Result Date: 02/21/2018 CLINICAL DATA:  Follow up small bowel obstruction EXAM: PORTABLE ABDOMEN - 1 VIEW COMPARISON:  02/20/2018 FINDINGS: Scattered large and small bowel gas is noted. Considerable rounded retained fecal material is again noted within the colon. No free air is seen. Persistent small bowel dilatation is noted but slightly decreased when  compared with the prior study. No bony abnormality is noted. IMPRESSION: Stable stool burden within the colon. Persistent but slightly improved small bowel dilatation. Electronically Signed   By: Inez Catalina M.D.   On: 02/21/2018 08:14   Dg Abd Portable 1v  Result Date: 02/20/2018 CLINICAL DATA:  Small-bowel obstruction. EXAM: PORTABLE ABDOMEN - 1 VIEW COMPARISON:  Plain films of the abdomen dated 02/19/2018 and 02/18/2018. FINDINGS: Continued gaseous distention of the small bowel loops throughout the upper and central abdomen. Stool and gas noted within the colon. No evidence of free intraperitoneal air, although the upper portion of the abdomen is excluded on this exam. IMPRESSION: Continued evidence of small bowel obstruction, presumably partial obstruction as there is gas and stool seen in the colon, not appreciably changed compared to the previous exams. Electronically Signed   By: Franki Cabot M.D.   On: 02/20/2018 09:26   Dg Abd Portable 1v  Result Date: 02/19/2018 CLINICAL DATA:  NG tube check EXAM: PORTABLE ABDOMEN - 1 VIEW COMPARISON:  February 19, 2018 FINDINGS: An NG tube terminates just to the left of midline in the upper abdomen, likely within the body of the stomach. Continued dilatation of small bowel loops. No other abnormalities. IMPRESSION: The NG tube terminates in the stomach. Continued small bowel obstruction. Electronically Signed   By: Dorise Bullion III M.D   On: 02/19/2018 23:41   Dg Abd Portable 1v  Result Date: 02/19/2018 CLINICAL DATA:  NG tube placement EXAM: PORTABLE ABDOMEN - 1 VIEW COMPARISON:  02/19/2018 FINDINGS: NG tube has been placed with the tip in the mid to distal stomach. Small bowel dilatation again noted compatible with small bowel obstruction. Large stool burden throughout the colon. No free air or organomegaly. IMPRESSION: NG tube tip in the mid to distal stomach. Continued small bowel obstruction pattern Large stool burden throughout the colon.  Electronically Signed   By: Rolm Baptise M.D.   On: 02/19/2018 13:11   Dg Abd Portable 1v  Result Date: 02/19/2018 CLINICAL DATA:  Small bowel obstruction. EXAM: PORTABLE ABDOMEN - 1 VIEW COMPARISON:  02/18/2018 FINDINGS: The enteric tube has been removed. Numerous gas-filled loops of dilated small bowel have not significantly changed. A moderate amount of stool is present in the colon. No acute osseous abnormality is seen. IMPRESSION: Interval removal of enteric tube. Unchanged small bowel dilatation consistent with obstruction. Electronically Signed   By: Logan Bores M.D.   On: 02/19/2018 09:00   Dg Abd Portable 1v  Result Date: 02/18/2018 CLINICAL DATA:  Abdominal pain, nausea, vomiting EXAM: PORTABLE ABDOMEN - 1 VIEW COMPARISON:  None FINDINGS: Increased stool in proximal half of colon. Paucity of distal colonic stool. Numerous minimally dilated air-filled loops of small bowel throughout the abdomen and pelvis, question ileus versus obstruction. No bowel wall thickening. Osseous structures unremarkable. Numerous pelvic phleboliths without definite urinary tract calcification. IMPRESSION: Increased stool throughout proximal half of colon. Air-filled distended small bowel loops throughout abdomen and pelvis, question ileus or obstruction; consider assessment by CT imaging with contrast. Electronically Signed   By: Lavonia Dana M.D.   On: 02/18/2018 10:24    Time Spent in minutes  30   Lala Lund M.D on 02/23/2018 at 11:01 AM  To page go to [email or web address] - password [facility]

## 2018-02-23 NOTE — Progress Notes (Signed)
CSW continuing to follow for discharge needs.  Kaicen Desena LCSW [phone]  

## 2018-02-23 NOTE — Evaluation (Signed)
Physical Therapy Evaluation Patient Details Name: Nathan [name] MRN: [id number] DOB: 1960-11-14 Today's Date: 02/23/2018   History of Present Illness  Pt is a 57 y/o male admitted secondary to lethargy and diarrhea. Thought to have metabolic encephalopathy and sepsis likely secondary to small SBO. PMH includes cognitive disability, HIV, bipolar disorder, CKD, and schizophrenia.   Clinical Impression  Pt admitted secondary to problem above with deficits below. Pt requiring min guard for safety during ambulation with RW. Did demonstrate decreased safety awareness with use of RW and tended to drift R and L and run into objects. Was able to correct technique with cues. Per notes, pt from group home, so pt with likely 24/7 support. If pt does not have 24/7 support, may need to consider short term SNF prior to return to group home. Will continue to follow acutely to maximize functional mobility independence and safety.     Follow Up Recommendations Home health PT;Supervision/Assistance - 24 hour    Equipment Recommendations  Rolling walker with 5" wheels    Recommendations for Other Services OT consult     Precautions / Restrictions Precautions Precautions: Fall Restrictions Weight Bearing Restrictions: No      Mobility  Bed Mobility Overal bed mobility: Needs Assistance Bed Mobility: Supine to Sit     Supine to sit: Supervision     General bed mobility comments: Supervision and increased time for safety.   Transfers Overall transfer level: Needs assistance Equipment used: None;Rolling walker (2 wheeled) Transfers: Sit to/from Stand Sit to Stand: Min assist;Min guard         General transfer comment: Initially requiring min A for steadying assist and lift assist without AD. Improved balance noted with use of RW and required min guard for safety  Ambulation/Gait Ambulation/Gait assistance: Min guard Gait Distance (Feet): 150 Feet Assistive device: Rolling walker (2  wheeled) Gait Pattern/deviations: Step-through pattern;Decreased stride length;Drifts right/left Gait velocity: Decreased    General Gait Details: Pt with decreased safety awareness when using RW. Tended to run into obstacles and drift to the L and R during gait this session. OVerall was steady, however, and required min guard A for safety.   Stairs            Wheelchair Mobility    Modified Rankin (Stroke Patients Only)       Balance Overall balance assessment: Needs assistance Sitting-balance support: No upper extremity supported;Feet supported Sitting balance-Leahy Scale: Good     Standing balance support: During functional activity;Bilateral upper extremity supported Standing balance-Leahy Scale: Poor Standing balance comment: Reliant on BUE support                              Pertinent Vitals/Pain Pain Assessment: No/denies pain    Home Living Family/patient expects to be discharged to:: Group home                 Additional Comments: Pt from [place] family care per chart.     Prior Function Level of Independence: Independent         Comments: Unsure of baseline level of function given pt's cognitive deficits, however, reports he did not need AD to ambulate.      Hand Dominance        Extremity/Trunk Assessment   Upper Extremity Assessment Upper Extremity Assessment: Defer to OT evaluation    Lower Extremity Assessment Lower Extremity Assessment: Generalized weakness    Cervical / Trunk Assessment Cervical /  Trunk Assessment: Kyphotic  Communication   Communication: Expressive difficulties  Cognition Arousal/Alertness: Awake/alert Behavior During Therapy: WFL for tasks assessed/performed Overall Cognitive Status: No family/caregiver present to determine baseline cognitive functioning                                 General Comments: Pt with cognitive disability at baseline, however, unsure if pt at baseline.        General Comments General comments (skin integrity, edema, etc.): No family/caregiver present during session.     Exercises     Assessment/Plan    PT Assessment Patient needs continued PT services  PT Problem List Decreased strength;Decreased mobility;Decreased coordination;Decreased cognition;Decreased knowledge of use of DME;Decreased safety awareness;Decreased knowledge of precautions       PT Treatment Interventions DME instruction;Gait training;Therapeutic activities;Functional mobility training;Balance training;Therapeutic exercise;Patient/family education;Stair training;Cognitive remediation    PT Goals (Current goals can be found in the Care Plan section)  Acute Rehab PT Goals Patient Stated Goal: to get some milk PT Goal Formulation: With patient Time For Goal Achievement: 03/09/18 Potential to Achieve Goals: Good    Frequency Min 3X/week   Barriers to discharge        Co-evaluation               AM-PAC PT "6 Clicks" Daily Activity  Outcome Measure Difficulty turning over in bed (including adjusting bedclothes, sheets and blankets)?: A Little Difficulty moving from lying on back to sitting on the side of the bed? : A Little Difficulty sitting down on and standing up from a chair with arms (e.g., wheelchair, bedside commode, etc,.)?: Unable Help needed moving to and from a bed to chair (including a wheelchair)?: A Little Help needed walking in hospital room?: A Little Help needed climbing 3-5 steps with a railing? : A Lot 6 Click Score: 15    End of Session Equipment Utilized During Treatment: Gait belt Activity Tolerance: Patient tolerated treatment well Patient left: in chair;with call bell/phone within reach;with chair alarm set Nurse Communication: Mobility status PT Visit Diagnosis: Muscle weakness (generalized) (M62.81);Other abnormalities of gait and mobility (R26.89)    Time: 6812-7517 PT Time Calculation (min) (ACUTE ONLY): 24  min   Charges:   PT Evaluation $PT Eval Low Complexity: 1 Low PT Treatments $Gait Training: 8-22 mins        Leighton Ruff, PT, DPT  Acute Rehabilitation Services  Pager: [phone] Office: [phone]   Rudean Hitt 02/23/2018, 4:28 PM

## 2018-02-24 LAB — GLUCOSE, CAPILLARY
GLUCOSE-CAPILLARY: 145 mg/dL — AB (ref 70–99)
Glucose-Capillary: 149 mg/dL — ABNORMAL HIGH (ref 70–99)
Glucose-Capillary: 163 mg/dL — ABNORMAL HIGH (ref 70–99)
Glucose-Capillary: 167 mg/dL — ABNORMAL HIGH (ref 70–99)
Glucose-Capillary: 178 mg/dL — ABNORMAL HIGH (ref 70–99)

## 2018-02-24 LAB — CULTURE, BLOOD (ROUTINE X 2)
CULTURE: NO GROWTH
Culture: NO GROWTH
SPECIAL REQUESTS: ADEQUATE
Special Requests: ADEQUATE

## 2018-02-24 LAB — PATHOLOGIST SMEAR REVIEW

## 2018-02-24 MED ORDER — HALOPERIDOL LACTATE 5 MG/ML IJ SOLN
5.0000 mg | Freq: Once | INTRAMUSCULAR | Status: AC
  Administered 2018-02-24: 5 mg via INTRAVENOUS
  Filled 2018-02-24: qty 1

## 2018-02-24 MED ORDER — CLOZAPINE 100 MG PO TABS
100.0000 mg | ORAL_TABLET | Freq: Two times a day (BID) | ORAL | Status: DC
  Administered 2018-02-24: 100 mg via ORAL
  Filled 2018-02-24 (×2): qty 1

## 2018-02-24 MED ORDER — ATORVASTATIN CALCIUM 20 MG PO TABS: 20.0000 mg | ORAL_TABLET | Freq: Every day | ORAL | Status: DC

## 2018-02-24 MED ORDER — POLYETHYLENE GLYCOL 3350 17 G PO PACK: 17.0000 g | PACK | Freq: Every day | ORAL | 0 refills | Status: DC

## 2018-02-24 MED ORDER — DOCUSATE SODIUM 100 MG PO CAPS: 100.0000 mg | ORAL_CAPSULE | Freq: Every day | ORAL | 0 refills | Status: AC

## 2018-02-24 NOTE — Plan of Care (Signed)
  Problem: Education: Goal: Knowledge of General Education information will improve Description: Including pain rating scale, medication(s)/side effects and non-pharmacologic comfort measures Outcome: Completed/Met   Problem: Health Behavior/Discharge Planning: Goal: Ability to manage health-related needs will improve Outcome: Completed/Met   Problem: Clinical Measurements: Goal: Ability to maintain clinical measurements within normal limits will improve Outcome: Completed/Met Goal: Will remain free from infection Outcome: Completed/Met Goal: Diagnostic test results will improve Outcome: Completed/Met Goal: Respiratory complications will improve Outcome: Completed/Met Goal: Cardiovascular complication will be avoided Outcome: Completed/Met   Problem: Activity: Goal: Risk for activity intolerance will decrease Outcome: Completed/Met   Problem: Nutrition: Goal: Adequate nutrition will be maintained Outcome: Completed/Met   Problem: Coping: Goal: Level of anxiety will decrease Outcome: Completed/Met   Problem: Elimination: Goal: Will not experience complications related to bowel motility Outcome: Completed/Met Goal: Will not experience complications related to urinary retention Outcome: Completed/Met   Problem: Safety: Goal: Ability to remain free from injury will improve Outcome: Completed/Met   Problem: Skin Integrity: Goal: Risk for impaired skin integrity will decrease Outcome: Completed/Met   

## 2018-02-24 NOTE — Evaluation (Signed)
Occupational Therapy Evaluation Patient Details Name: Nathan [name] MRN: [id number] DOB: 03-26-1961 Today's Date: 02/24/2018    History of Present Illness Pt is a 57 y/o male admitted secondary to lethargy and diarrhea. Thought to have metabolic encephalopathy and sepsis likely secondary to small SBO. PMH includes cognitive disability, HIV, bipolar disorder, CKD, and schizophrenia.    Clinical Impression   Pt admitted with above. He demonstrates the below listed deficits.  He currently requires min A for ADLs due to decreased activity tolerance and balance deficits.  He lives in a group home - unusure of PLOF.  Per chart, pt is to discharge back to group home today.  Recommend HHOT at discharge.  All further OT needs can be addressed by OT.  DOE 3/4 with ADLs.       Follow Up Recommendations  Home health OT;Supervision/Assistance - 24 hour    Equipment Recommendations  None recommended by OT    Recommendations for Other Services       Precautions / Restrictions Precautions Precautions: Fall Restrictions Weight Bearing Restrictions: No      Mobility Bed Mobility               General bed mobility comments: Pt sitting up in chair   Transfers Overall transfer level: Needs assistance Equipment used: None;Rolling walker (2 wheeled) Transfers: Sit to/from Stand Sit to Stand: Min guard         General transfer comment: min guard assist for safety     Balance Overall balance assessment: Needs assistance Sitting-balance support: No upper extremity supported;Feet supported Sitting balance-Leahy Scale: Good     Standing balance support: During functional activity;Bilateral upper extremity supported Standing balance-Leahy Scale: Poor Standing balance comment: Reliant on BUE support                            ADL either performed or assessed with clinical judgement   ADL Overall ADL's : Needs assistance/impaired Eating/Feeding:  Independent;Sitting   Grooming: Wash/dry hands;Wash/dry face;Brushing hair;Min guard;Standing   Upper Body Bathing: Supervision/ safety;Set up;Sitting   Lower Body Bathing: Minimal assistance;Sit to/from stand   Upper Body Dressing : Set up;Supervision/safety;Sitting   Lower Body Dressing: Minimal assistance;Sit to/from stand Lower Body Dressing Details (indicate cue type and reason): able to don/doff socks but requires increased time  Toilet Transfer: Minimal assistance;Ambulation;Comfort height toilet;RW Armed forces technical officer Details (indicate cue type and reason): Pt requires assist to maneuver RW  Toileting- Clothing Manipulation and Hygiene: Minimal assistance;Sit to/from stand       Functional mobility during ADLs: Minimal assistance;Rolling walker General ADL Comments: assist for balance      Vision   Additional Comments: not tested      Perception     Praxis      Pertinent Vitals/Pain Pain Assessment: No/denies pain     Hand Dominance Right   Extremity/Trunk Assessment Upper Extremity Assessment Upper Extremity Assessment: Generalized weakness   Lower Extremity Assessment Lower Extremity Assessment: Defer to PT evaluation   Cervical / Trunk Assessment Cervical / Trunk Assessment: Kyphotic   Communication Communication Communication: Expressive difficulties(limited interactions )   Cognition Arousal/Alertness: Lethargic Behavior During Therapy: WFL for tasks assessed/performed;Flat affect Overall Cognitive Status: No family/caregiver present to determine baseline cognitive functioning                                 General Comments: Pt with cognitive disability  at baseline, however, unsure if pt at baseline.    General Comments  Pt requires increased time to complete activities.  DOE 3/4 with donning socks.  per chart, pt will discharge back to group home this pm     Exercises     Shoulder Instructions      Home Living Family/patient  expects to be discharged to:: Group home                                 Additional Comments: Pt from [place] [facility] per chart.       Prior Functioning/Environment Level of Independence: Independent(unsure )        Comments: Unsure of baseline level of function given pt's cognitive deficits, however, reports he did not need AD to ambulate.         OT Problem List: Decreased strength;Decreased activity tolerance;Impaired balance (sitting and/or standing);Decreased cognition;Decreased safety awareness;Decreased knowledge of use of DME or AE      OT Treatment/Interventions:      OT Goals(Current goals can be found in the care plan section) Acute Rehab OT Goals Patient Stated Goal: to sleep  OT Goal Formulation: All assessment and education complete, DC therapy  OT Frequency:     Barriers to D/C:            Co-evaluation              AM-PAC PT "6 Clicks" Daily Activity     Outcome Measure Help from another person eating meals?: None Help from another person taking care of personal grooming?: A Little Help from another person toileting, which includes using toliet, bedpan, or urinal?: A Little Help from another person bathing (including washing, rinsing, drying)?: A Little Help from another person to put on and taking off regular upper body clothing?: A Little Help from another person to put on and taking off regular lower body clothing?: A Little 6 Click Score: 19   End of Session Equipment Utilized During Treatment: Rolling walker;Gait belt  Activity Tolerance: Patient limited by fatigue Patient left: in chair;with call bell/phone within reach;with chair alarm set  OT Visit Diagnosis: Unsteadiness on feet (R26.81)                Time: 3546-5681 OT Time Calculation (min): 16 min Charges:  OT General Charges $OT Visit: 1 Visit OT Evaluation $OT Eval Moderate Complexity: 1 Mod  Lucille Passy, OTR/L Acute Rehabilitation Services Pager  [phone] Office [phone]   Lucille Passy M 02/24/2018, 3:12 PM

## 2018-02-24 NOTE — Progress Notes (Signed)
Nsg Discharge Note  Admit Date:  02/16/2018 Discharge date: 02/24/2018   Nathan [name] to be D/C'd Group Home per MD order.  AVS completed.  Copy for chart, and copy for patient signed, and dated. Patient/caregiver able to verbalize understanding.  Discharge Medication: Allergies as of 02/24/2018   No Known Allergies     Medication List    TAKE these medications   atorvastatin 20 MG tablet Commonly known as:  LIPITOR Take 20 mg by mouth daily at 6 PM.   benztropine 2 MG tablet Commonly known as:  COGENTIN Take 2 mg by mouth 2 (two) times daily.   bictegravir-emtricitabine-tenofovir AF 50-200-25 MG Tabs tablet Commonly known as:  BIKTARVY Take 1 tablet by mouth daily.   cetirizine 10 MG tablet Commonly known as:  ZYRTEC TAKE 1 TABLET ONCE DAILY.   clozapine 100 MG disintegrating tablet Commonly known as:  FAZACLO Take 50-100 mg by mouth See admin instructions. 100mg  in the morning and 50mg  at bedtime   docusate sodium 100 MG capsule Commonly known as:  COLACE Take 1 capsule (100 mg total) by mouth daily.   folic acid 1 MG tablet Commonly known as:  FOLVITE Take 1 mg by mouth daily.   gabapentin 300 MG capsule Commonly known as:  NEURONTIN Take 300 mg by mouth every morning. Take with the 400mg  tablet to equal a total of 700mg  in the mornings.   gabapentin 400 MG capsule Commonly known as:  NEURONTIN Take 400 mg by mouth 2 (two) times daily.   haloperidol 5 MG tablet Commonly known as:  HALDOL Take 2 tablets (10 mg total) by mouth daily with supper. What changed:  how much to take   levETIRAcetam 500 MG tablet Commonly known as:  KEPPRA Take 1 tablet (500 mg total) by mouth 2 (two) times daily.   polyethylene glycol packet Commonly known as:  MIRALAX / GLYCOLAX Take 17 g by mouth daily.   ranitidine 150 MG tablet Commonly known as:  ZANTAC TAKE 1 TABLET BY MOUTH 12 HOURS AFTER TAKING REYATAZ. What changed:  See the new instructions.   senna 8.6  MG tablet Commonly known as:  SENOKOT Take 2 tablets by mouth 2 (two) times daily.   valproic acid 250 MG capsule Commonly known as:  DEPAKENE Take 2 capsules every morning then take 4 capsules at bedtime What changed:    how much to take  how to take this  when to take this  additional instructions            Durable Medical Equipment  (From admission, onward)         Start     Ordered   02/24/18 1041  For home use only DME Walker rolling  [facility])  Once    Question:  Patient needs a walker to treat with the following condition  Answer:  Weakness   02/24/18 1041   02/24/18 1041  For home use only DME Walker  [facility])  Once    Question:  Patient needs a walker to treat with the following condition  Answer:  Weakness   02/24/18 1041          Discharge Assessment: Vitals:   02/24/18 0746 02/24/18 1211  BP: 129/84   Pulse: 95   Resp: 17   Temp: 98.1 F (36.7 C) 99.1 F (37.3 C)  SpO2: 99%    Skin clean, dry and intact without evidence of skin break down, no evidence of skin tears noted. IV catheter discontinued  intact. Site without signs and symptoms of complications - no redness or edema noted at insertion site, patient denies c/o pain - only slight tenderness at site.  Dressing with slight pressure applied.  D/c Instructions-Education: Discharge instructions given to patient/family with verbalized understanding. D/c education completed with patient/family including follow up instructions, medication list, d/c activities limitations if indicated, with other d/c instructions as indicated by MD - patient able to verbalize understanding, all questions fully answered. Patient instructed to return to ED, call 911, or call MD for any changes in condition.  Patient escorted via [place], and D/C to [facility] via private auto with [name] from the home.   Salley Slaughter, RN 02/24/2018 3:00 PM

## 2018-02-24 NOTE — Care Management Note (Addendum)
Case Management Note  Patient Details  Name: Nathan [name] MRN: [id number] Date of Birth: 03-28-1961  Subjective/Objective:         Admitted with lethargy and diarrhea / SBO. Hx of  cognitive disability, HIV, bipolar disorder, CKD, and schizophrenia. From group home.  Linna Darner (Legal Guardian) Lelon Perla (Other) Bovey Liggins-Neal (Other539 [phone]        [phone]                               PCP: Lillia Corporal  Action/Plan: Transition back to group home / [facility].  Expected Discharge Date:  02/24/18               Expected Discharge Plan:  [place]  In-House Referral:  Clinical Social Work  Discharge planning Services  CM Consult  Post Acute Care Choice:  NA Choice offered to:  Patient, [facility] [place] POA / Guardian  DME Arranged:  Walker rolling DME Agency:  [place] Arranged:  PT, RN, Social Work [employer] Agency:  [place]  Status of Service:  Completed, signed off  If discussed at [employer], dates discussed:    Additional Comments:  Sharin Mons, RN 02/24/2018, 12:06 PM

## 2018-02-24 NOTE — Progress Notes (Signed)
CSW confirmed that patient will return to [facility]. They request a copy of the AVS when [name] from the facility comes to pick patient up after 4pm today. RNCM setting up home health services. Patient's legal Guardian, Cheri, notified.   CSW signing off as no further needs identified.   Percell Locus Leamon Palau LCSW [phone]

## 2018-02-24 NOTE — Progress Notes (Signed)
Physical Therapy Treatment Patient Details Name: Nathan [name] MRN: [id number] DOB: [birth year] Today's Date: 02/24/2018    History of Present Illness Pt is a 57 y/o male admitted secondary to lethargy and diarrhea. Thought to have metabolic encephalopathy and sepsis likely secondary to small SBO. PMH includes cognitive disability, HIV, bipolar disorder, CKD, and schizophrenia.     PT Comments    Pt showed unsafe due to too sleepy.  Sit to stand and gait stability required min assist at best with gait pattern degrading to a shuffle with pt lethargic throughout.    Follow Up Recommendations  Home health PT;Supervision/Assistance - 24 hour     Equipment Recommendations  Rolling walker with 5" wheels    Recommendations for Other Services OT consult     Precautions / Restrictions Precautions Precautions: Fall Restrictions Weight Bearing Restrictions: No    Mobility  Bed Mobility               General bed mobility comments: Pt sitting up in chair   Transfers Overall transfer level: Needs assistance Equipment used: None;Rolling walker (2 wheeled) Transfers: Sit to/from Stand Sit to Stand: Min assist         General transfer comment: min assist due to sleepy  Ambulation/Gait Ambulation/Gait assistance: Min assist Gait Distance (Feet): 170 Feet Assistive device: Rolling walker (2 wheeled) Gait Pattern/deviations: Step-through pattern Gait velocity: Decreased    General Gait Details: getting too far  away from RW and progressively worsening shuffled steps.   Stairs             Wheelchair Mobility    Modified Rankin (Stroke Patients Only)       Balance Overall balance assessment: Needs assistance Sitting-balance support: No upper extremity supported;Feet supported Sitting balance-Leahy Scale: Fair     Standing balance support: During functional activity;Bilateral upper extremity supported Standing balance-Leahy Scale: Poor Standing balance  comment: Reliant on BUE support                             Cognition Arousal/Alertness: Lethargic(difficult to awaken) Behavior During Therapy: WFL for tasks assessed/performed;Flat affect Overall Cognitive Status: No family/caregiver present to determine baseline cognitive functioning                                 General Comments: Pt with cognitive disability at baseline, however, unsure if pt at baseline.       Exercises      General Comments General comments (skin integrity, edema, etc.): stood at edge of chair for pericare (rectal pouch loose) and pt unable to stand without RW and or external assist      Pertinent Vitals/Pain Pain Assessment: No/denies pain    Home Living Family/patient expects to be discharged to:: Group home               Additional Comments: Pt from [place] family care per chart.     Prior Function Level of Independence: Independent(unsure )      Comments: Unsure of baseline level of function given pt's cognitive deficits, however, reports he did not need AD to ambulate.    PT Goals (current goals can now be found in the care plan section) Acute Rehab PT Goals Patient Stated Goal: to sleep  PT Goal Formulation: With patient Time For Goal Achievement: 03/09/18 Potential to Achieve Goals: Good Progress towards PT goals: Progressing toward goals  Frequency    Min 3X/week      PT Plan Current plan remains appropriate    Co-evaluation              AM-PAC PT "6 Clicks" Daily Activity  Outcome Measure  Difficulty turning over in bed (including adjusting bedclothes, sheets and blankets)?: A Little Difficulty moving from lying on back to sitting on the side of the bed? : A Little Difficulty sitting down on and standing up from a chair with arms (e.g., wheelchair, bedside commode, etc,.)?: Unable Help needed moving to and from a bed to chair (including a wheelchair)?: A Little Help needed walking in  hospital room?: A Little Help needed climbing 3-5 steps with a railing? : A Little 6 Click Score: 16    End of Session   Activity Tolerance: Patient tolerated treatment well;Patient limited by fatigue Patient left: in chair;with call bell/phone within reach;with chair alarm set Nurse Communication: Mobility status PT Visit Diagnosis: Muscle weakness (generalized) (M62.81);Other abnormalities of gait and mobility (R26.89)     Time: 1884-1660 PT Time Calculation (min) (ACUTE ONLY): 22 min  Charges:  $Gait Training: 8-22 mins                     02/24/2018  Nathan [name], [place] [phone]  (pager) [phone]  (office)   Nathan [name] Nathan [name] 02/24/2018, 4:59 PM

## 2018-02-24 NOTE — Discharge Instructions (Signed)
Follow with Primary MD Nathan [name], Nathan Bathe, MD and your psychiatrist in 7 days   Get CBC, CMP, 2 view Chest X ray -  checked  by Primary MD or SNF MD in 5-7 days    Activity: As tolerated with Full fall precautions use walker/cane & assistance as needed  Disposition Group Home  Diet: Heart Healthy   Special Instructions: If you have smoked or chewed Tobacco  in the last 2 yrs please stop smoking, stop any regular Alcohol  and or any Recreational drug use.  On your next visit with your primary care physician please Get Medicines reviewed and adjusted.  Please request your Prim.MD to go over all Hospital Tests and Procedure/Radiological results at the follow up, please get all Hospital records sent to your Prim MD by signing hospital release before you go home.  If you experience worsening of your admission symptoms, develop shortness of breath, life threatening emergency, suicidal or homicidal thoughts you must seek medical attention immediately by calling 911 or calling your MD immediately  if symptoms less severe.  You Must read complete instructions/literature along with all the possible adverse reactions/side effects for all the Medicines you take and that have been prescribed to you. Take any new Medicines after you have completely understood and accpet all the possible adverse reactions/side effects.

## 2018-02-24 NOTE — Discharge Summary (Signed)
Nathan [name] CXK:[id number] DOB: 1961-01-01 DOA: 02/16/2018  PCP: Javier Docker, MD  Admit date: 02/16/2018  Discharge date: 02/24/2018  Admitted From: Group home   disposition: Group home   Recommendations for Outpatient Follow-up:   Follow up with PCP in 1-2 weeks  PCP Please obtain BMP/CBC, 2 view CXR in 1week,  (see Discharge instructions)   PCP Please follow up on the following pending results:    Home Health: PT, RN, social work Equipment/Devices: Conservation officer, nature Consultations: Surgery Discharge Condition: Stable CODE STATUS: Full Diet Recommendation: Heart Healthy    Chief Complaint  Patient presents with  . Altered Mental Status     Brief history of present illness from the day of admission and additional interim summary   57 year old male group home resident at [facility] care, H/O HIV on [place] seen in office 01/15/2018-CD4 750 viral load undetectable, Seizures with tonic-clonic movements- on Keppra and valproic acid, known history of chronic dysarthria, CKD 4, was brought in on 02/16/2018 with lethargy, diarrhea, fever elevated lactate levels, he was treated by the hospitalist team for 2 days and he was transferred to my care on 02/18/2018.                                                                 Hospital Course    1.  Metabolic and toxic encephalopathy.  Likely due to combination of diarrhea, dehydration, Hypernatremia and SBO.  Improved, unremarkable EEG, head CT unremarkable no focal neuro deficits hydration and supportive care mentation is improving likely he is close to his baseline but does have underlying mental retardation and history of psychosis.  2.  SBO.  Likely partial SBO versus severe ileus with a lot of stool burden.  By general surgery, initially  required NG tube now placed on bowel regimen with regular bowel movements, abdominal distention much improved, nausea resolved tolerating soft diet, discharge back to group home with bowel regimen in place.  3.  Urinary retention.  Old after brief course of Foley and Flomax likely brought on by severe constipation and stool burden.  4.  History of seizures.  Switched his seizure medications to IV, stable EEG this admission.  5.  ARF on CKD 4.  Baseline creatinine is around 1.6.  Hydrate and monitor.  Also Foley placed for urinary retention.  RF has resolved now close to baseline.  6.  E. coli diarrhea.  Discussed with ID physician Dr. De Burrs.  No need for antibiotics for now.  Question if this was just a colonizing bacteria.  7.  Dehydration with hypernatremia and hypokalemia.  Placed on D5W with potassium.  Improving will monitor.  8.  Dyslipidemia.  On statin continue.  9.  Mild atelectasis.  No pneumonia.  Incentive spirometry added.  Did have 2-3 episodes of emesis early  morning on 02/18/2018 currently no signs of aspiration will continue to monitor clinically.  10. History of HIV.  Continue home medication last counts were controlled few months ago follows with Dr. Drucilla Schmidt.   Discharge diagnosis     Principal Problem:   Sepsis due to pneumonia [facility]) Active Problems:   HIV disease ([place])   Bipolar 1 disorder, mixed, moderate ([place])   Intellectual disability   CKD (chronic kidney disease) stage 3, GFR 30-59 ml/min ([place])   ARF (acute renal failure) ([place])   Acute urinary retention   Acute encephalopathy   Altered mental state   Schizoaffective disorder ([place])   Diarrhea    Discharge instructions    Discharge Instructions    Diet - low sodium heart healthy   Complete by:  As directed    Discharge instructions   Complete by:  As directed    Follow with Primary MD Pavelock, Ralene Bathe, MD and your psychiatrist in 7 days   Get CBC, CMP, 2 view Chest X ray -  checked  by  Primary MD or SNF MD in 5-7 days    Activity: As tolerated with Full fall precautions use walker/cane & assistance as needed  Disposition Group Home  Diet: Heart Healthy   Special Instructions: If you have smoked or chewed Tobacco  in the last 2 yrs please stop smoking, stop any regular Alcohol  and or any Recreational drug use.  On your next visit with your primary care physician please Get Medicines reviewed and adjusted.  Please request your Prim.MD to go over all Hospital Tests and Procedure/Radiological results at the follow up, please get all Hospital records sent to your Prim MD by signing hospital release before you go home.  If you experience worsening of your admission symptoms, develop shortness of breath, life threatening emergency, suicidal or homicidal thoughts you must seek medical attention immediately by calling 911 or calling your MD immediately  if symptoms less severe.  You Must read complete instructions/literature along with all the possible adverse reactions/side effects for all the Medicines you take and that have been prescribed to you. Take any new Medicines after you have completely understood and accpet all the possible adverse reactions/side effects.   Increase activity slowly   Complete by:  As directed       Discharge Medications   Allergies as of 02/24/2018   No Known Allergies     Medication List    TAKE these medications   atorvastatin 20 MG tablet Commonly known as:  LIPITOR Take 20 mg by mouth daily at 6 PM.   benztropine 2 MG tablet Commonly known as:  COGENTIN Take 2 mg by mouth 2 (two) times daily.   bictegravir-emtricitabine-tenofovir AF 50-200-25 MG Tabs tablet Commonly known as:  BIKTARVY Take 1 tablet by mouth daily.   cetirizine 10 MG tablet Commonly known as:  ZYRTEC TAKE 1 TABLET ONCE DAILY.   clozapine 100 MG disintegrating tablet Commonly known as:  FAZACLO Take 50-100 mg by mouth See admin instructions. 100mg  in the  morning and 50mg  at bedtime   docusate sodium 100 MG capsule Commonly known as:  COLACE Take 1 capsule (100 mg total) by mouth daily.   folic acid 1 MG tablet Commonly known as:  FOLVITE Take 1 mg by mouth daily.   gabapentin 300 MG capsule Commonly known as:  NEURONTIN Take 300 mg by mouth every morning. Take with the 400mg  tablet to equal a total of 700mg  in the mornings.   gabapentin  400 MG capsule Commonly known as:  NEURONTIN Take 400 mg by mouth 2 (two) times daily.   haloperidol 5 MG tablet Commonly known as:  HALDOL Take 2 tablets (10 mg total) by mouth daily with supper. What changed:  how much to take   levETIRAcetam 500 MG tablet Commonly known as:  KEPPRA Take 1 tablet (500 mg total) by mouth 2 (two) times daily.   polyethylene glycol packet Commonly known as:  MIRALAX / GLYCOLAX Take 17 g by mouth daily.   ranitidine 150 MG tablet Commonly known as:  ZANTAC TAKE 1 TABLET BY MOUTH 12 HOURS AFTER TAKING REYATAZ. What changed:  See the new instructions.   senna 8.6 MG tablet Commonly known as:  SENOKOT Take 2 tablets by mouth 2 (two) times daily.   valproic acid 250 MG capsule Commonly known as:  DEPAKENE Take 2 capsules every morning then take 4 capsules at bedtime What changed:    how much to take  how to take this  when to take this  additional instructions            Durable Medical Equipment  (From admission, onward)         Start     Ordered   02/24/18 1041  For home use only DME Walker rolling  [facility])  Once    Question:  Patient needs a walker to treat with the following condition  Answer:  Weakness   02/24/18 1041   02/24/18 1041  For home use only DME Walker  [facility])  Once    Question:  Patient needs a walker to treat with the following condition  Answer:  Weakness   02/24/18 1041          Follow-up Information    Pavelock, Ralene Bathe, MD. Schedule an appointment as soon as possible for a visit in 1 week(s).     Specialty:  Internal Medicine Why:  Along with your psychiatrist in a week Contact information: [address] [place] [address] [phone]           Major procedures and Radiology Reports - PLEASE review detailed and final reports thoroughly  -        Ct Abdomen Pelvis Wo Contrast  Result Date: 02/18/2018 CLINICAL DATA:  Follow up small bowel obstruction EXAM: CT ABDOMEN AND PELVIS WITHOUT CONTRAST TECHNIQUE: Multidetector CT imaging of the abdomen and pelvis was performed following the standard protocol without IV contrast. COMPARISON:  None. FINDINGS: Lower chest: Lung bases are free of acute infiltrate or sizable effusion. Hepatobiliary: The liver and gallbladder are within normal limits. Pancreas: Unremarkable. No pancreatic ductal dilatation or surrounding inflammatory changes. Spleen: Normal in size without focal abnormality. Adrenals/Urinary Tract: Adrenal glands are within normal limits bilaterally. No renal calculi or obstructive changes are seen. The bladder is well distended. Stomach/Bowel: Colon demonstrates scattered fecal material throughout without obstructive change. The appendix is unremarkable and air filled. The proximal small bowel appears within normal limits although in the distal jejunum and entire ileum there is small-bowel dilatation identified. No significant fecalization of small bowel contents is noted. There is a relative transition zone identified in the distal ileum just prior to the ileocecal valve. No mass lesion is seen. Correlation with any prior surgery is recommended as this may be related to adhesions. The stomach is within normal limits with the exception of a small sliding-type hiatal hernia. Vascular/Lymphatic: Aortic atherosclerosis. No enlarged abdominal or pelvic lymph nodes. Reproductive: Prostate is unremarkable. Other:  No abdominal wall hernia or abnormality. No abdominopelvic ascites. Musculoskeletal: Degenerative changes of  lumbar spine are seen. No acute bony abnormality is noted. IMPRESSION: Distal small bowel obstruction involving the ileum just prior to the ileocecal valve. No definitive mass lesion is identified. Correlate with any prior surgery as to the possibility of adhesions. The jejunum is nondilated. No other focal abnormality is seen. Electronically Signed   By: Inez Catalina M.D.   On: 02/18/2018 15:53   Dg Chest 2 View  Result Date: 02/17/2018 CLINICAL DATA:  History of pneumonia EXAM: CHEST - 2 VIEW COMPARISON:  02/16/2018. FINDINGS: Lung volumes with mild bibasilar atelectasis/infiltrates. No significant change from prior exam. No pleural effusion or pneumothorax. Heart size stable. IMPRESSION: Low lung volumes with mild bibasilar atelectasis/infiltrates. No significant change from prior exam. Electronically Signed   By: Marcello Moores  Register   On: 02/17/2018 12:23   Dg Abd 1 View  Result Date: 02/18/2018 CLINICAL DATA:  57 year old male status post nasogastric tube placement EXAM: ABDOMEN - 1 VIEW COMPARISON:  Prior abdominal radiograph obtained earlier today FINDINGS: Interval placement of a gastric tube. The tip of the tube overlies the gastric fundus. Persistent gaseous dilatation of numerous loops of small bowel in the abdomen consistent with small bowel obstruction. No acute osseous abnormality. IMPRESSION: The tip of the nasogastric tube overlies the gastric fundus. Electronically Signed   By: Jacqulynn Cadet M.D.   On: 02/18/2018 18:33   Ct Head Wo Contrast  Result Date: 02/16/2018 CLINICAL DATA:  Initial evaluation for acute altered mental status. EXAM: CT HEAD WITHOUT CONTRAST TECHNIQUE: Contiguous axial images were obtained from the base of the skull through the vertex without intravenous contrast. COMPARISON:  Prior CT from 05/05/2011. FINDINGS: Brain: Mild age-related cerebral atrophy with chronic small vessel ischemic disease. No acute intracranial hemorrhage. No acute large vessel territory  infarct. No mass lesion, midline shift or mass effect. No hydrocephalus. No extra-axial fluid collection. Vascular: No hyperdense vessel. Skull: Scalp soft tissues and calvarium within normal limits. Sinuses/Orbits: Globes and orbital soft tissues demonstrate no acute abnormality. Chronic left sphenoid sinusitis noted. Paranasal sinuses are otherwise largely clear. Trace opacity right mastoid air cells, of doubtful significance. Mastoid air cells otherwise clear. Other: None. IMPRESSION: 1. No acute intracranial abnormality. 2. Mild age-related cerebral atrophy with chronic small vessel ischemic disease. 3. Chronic left frontal sinusitis. Electronically Signed   By: Jeannine Boga M.D.   On: 02/16/2018 01:31   Dg Chest Port 1 View  Result Date: 02/19/2018 CLINICAL DATA:  Acute shortness of breath EXAM: PORTABLE CHEST 1 VIEW COMPARISON:  02/18/2018 and prior radiographs FINDINGS: The cardiomediastinal silhouette is unremarkable. Mild bibasilar atelectasis again noted. There is no evidence of focal airspace disease, pulmonary edema, suspicious pulmonary nodule/mass, pleural effusion, or pneumothorax. No acute bony abnormalities are identified. IMPRESSION: Unchanged appearance of the chest with mild bibasilar atelectasis. Electronically Signed   By: Margarette Canada M.D.   On: 02/19/2018 09:16   Dg Chest Port 1 View  Result Date: 02/18/2018 CLINICAL DATA:  Shortness of breath, weakness EXAM: PORTABLE CHEST 1 VIEW COMPARISON:  02/17/2018 FINDINGS: Low lung volumes. Bibasilar atelectasis. Heart is normal size. No effusions or acute bony abnormality. IMPRESSION: Low lung volumes, bibasilar atelectasis. Electronically Signed   By: Rolm Baptise M.D.   On: 02/18/2018 11:41   Dg Chest Port 1 View  Result Date: 02/16/2018 CLINICAL DATA:  Altered mental status for 1 day. EXAM: PORTABLE CHEST 1 VIEW COMPARISON:  November 01, 2017 FINDINGS: The cardiomediastinal  silhouette is normal. Mild opacity in left base. No  other acute abnormalities. No change in the cardiomediastinal silhouette. IMPRESSION: Mild opacity in the left base may represent atelectasis or early infiltrate. Recommend clinical correlation. Electronically Signed   By: Dorise Bullion III M.D   On: 02/16/2018 01:00   Dg Abd Portable 1v  Result Date: 02/23/2018 CLINICAL DATA:  Follow-up small bowel obstruction. EXAM: PORTABLE ABDOMEN - 1 VIEW COMPARISON:  Abdominal radiographs of February 22, 2018 FINDINGS: There is a moderate amount of gas within mildly distended small bowel loops and normal caliber large bowel loops. There is a moderate amount of stool in the left colon and rectum. No free extraluminal gas collections are observed. The bony structures are unremarkable. There are numerous pelvic phleboliths. IMPRESSION: Fairly stable appearance of the abdomen with findings which may reflect a generalized ileus or partial small bowel obstruction. A moderate amount of stool remains in the left colon and rectum. Electronically Signed   By: David  Martinique M.D.   On: 02/23/2018 07:31   Dg Abd Portable 1v  Result Date: 02/22/2018 CLINICAL DATA:  Small-bowel obstruction, follow-up exam EXAM: PORTABLE ABDOMEN - 1 VIEW COMPARISON:  02/21/18 FINDINGS: Scattered large and small bowel gas is noted. A few loops of mildly dilated small bowel are again identified. Fecal material is again noted within the colon although improved from the prior exam. No free air is seen. No bony abnormality is noted. IMPRESSION: Slight improvement in the degree of retained fecal material within the colon. Persistent mild small bowel dilatation stable from the prior exam. Electronically Signed   By: Inez Catalina M.D.   On: 02/22/2018 08:13   Dg Abd Portable 1v  Result Date: 02/21/2018 CLINICAL DATA:  Follow up small bowel obstruction EXAM: PORTABLE ABDOMEN - 1 VIEW COMPARISON:  02/20/2018 FINDINGS: Scattered large and small bowel gas is noted. Considerable rounded retained fecal  material is again noted within the colon. No free air is seen. Persistent small bowel dilatation is noted but slightly decreased when compared with the prior study. No bony abnormality is noted. IMPRESSION: Stable stool burden within the colon. Persistent but slightly improved small bowel dilatation. Electronically Signed   By: Inez Catalina M.D.   On: 02/21/2018 08:14   Dg Abd Portable 1v  Result Date: 02/20/2018 CLINICAL DATA:  Small-bowel obstruction. EXAM: PORTABLE ABDOMEN - 1 VIEW COMPARISON:  Plain films of the abdomen dated 02/19/2018 and 02/18/2018. FINDINGS: Continued gaseous distention of the small bowel loops throughout the upper and central abdomen. Stool and gas noted within the colon. No evidence of free intraperitoneal air, although the upper portion of the abdomen is excluded on this exam. IMPRESSION: Continued evidence of small bowel obstruction, presumably partial obstruction as there is gas and stool seen in the colon, not appreciably changed compared to the previous exams. Electronically Signed   By: Franki Cabot M.D.   On: 02/20/2018 09:26   Dg Abd Portable 1v  Result Date: 02/19/2018 CLINICAL DATA:  NG tube check EXAM: PORTABLE ABDOMEN - 1 VIEW COMPARISON:  February 19, 2018 FINDINGS: An NG tube terminates just to the left of midline in the upper abdomen, likely within the body of the stomach. Continued dilatation of small bowel loops. No other abnormalities. IMPRESSION: The NG tube terminates in the stomach. Continued small bowel obstruction. Electronically Signed   By: Dorise Bullion III M.D   On: 02/19/2018 23:41   Dg Abd Portable 1v  Result Date: 02/19/2018 CLINICAL DATA:  NG tube placement  EXAM: PORTABLE ABDOMEN - 1 VIEW COMPARISON:  02/19/2018 FINDINGS: NG tube has been placed with the tip in the mid to distal stomach. Small bowel dilatation again noted compatible with small bowel obstruction. Large stool burden throughout the colon. No free air or organomegaly.  IMPRESSION: NG tube tip in the mid to distal stomach. Continued small bowel obstruction pattern Large stool burden throughout the colon. Electronically Signed   By: Rolm Baptise M.D.   On: 02/19/2018 13:11   Dg Abd Portable 1v  Result Date: 02/19/2018 CLINICAL DATA:  Small bowel obstruction. EXAM: PORTABLE ABDOMEN - 1 VIEW COMPARISON:  02/18/2018 FINDINGS: The enteric tube has been removed. Numerous gas-filled loops of dilated small bowel have not significantly changed. A moderate amount of stool is present in the colon. No acute osseous abnormality is seen. IMPRESSION: Interval removal of enteric tube. Unchanged small bowel dilatation consistent with obstruction. Electronically Signed   By: Logan Bores M.D.   On: 02/19/2018 09:00   Dg Abd Portable 1v  Result Date: 02/18/2018 CLINICAL DATA:  Abdominal pain, nausea, vomiting EXAM: PORTABLE ABDOMEN - 1 VIEW COMPARISON:  None FINDINGS: Increased stool in proximal half of colon. Paucity of distal colonic stool. Numerous minimally dilated air-filled loops of small bowel throughout the abdomen and pelvis, question ileus versus obstruction. No bowel wall thickening. Osseous structures unremarkable. Numerous pelvic phleboliths without definite urinary tract calcification. IMPRESSION: Increased stool throughout proximal half of colon. Air-filled distended small bowel loops throughout abdomen and pelvis, question ileus or obstruction; consider assessment by CT imaging with contrast. Electronically Signed   By: Lavonia Dana M.D.   On: 02/18/2018 10:24    Micro Results    Recent Results (from the past 240 hour(s))  Culture, blood (Routine X 2) w Reflex to ID Panel     Status: None (Preliminary result)   Collection Time: 02/16/18 12:51 AM  Result Value Ref Range Status   Specimen Description BLOOD RIGHT ANTECUBITAL  Final   Special Requests   Final    BOTTLES DRAWN AEROBIC AND ANAEROBIC Blood Culture adequate volume   Culture   Final    NO GROWTH 4  DAYS Performed at [facility], [place] [address], [place], [place] [address]    Report Status PENDING  Incomplete  Culture, blood (Routine X 2) w Reflex to ID Panel     Status: None (Preliminary result)   Collection Time: 02/16/18  2:15 AM  Result Value Ref Range Status   Specimen Description BLOOD LEFT ANTECUBITAL  Final   Special Requests   Final    BOTTLES DRAWN AEROBIC AND ANAEROBIC Blood Culture adequate volume   Culture   Final    NO GROWTH 4 DAYS Performed at [facility], [place] [address], [place], [place] [address]    Report Status PENDING  Incomplete  Urine Culture     Status: None   Collection Time: 02/16/18  2:37 AM  Result Value Ref Range Status   Specimen Description URINE, RANDOM  Final   Special Requests NONE  Final   Culture   Final    NO GROWTH Performed at [facility], [place] [address], [place], [place] [address]    Report Status 02/17/2018 FINAL  Final  C difficile quick scan w PCR reflex     Status: None   Collection Time: 02/16/18  4:16 AM  Result Value Ref Range Status   C Diff antigen NEGATIVE NEGATIVE Final   C Diff toxin NEGATIVE NEGATIVE Final  C Diff interpretation No C. difficile detected.  Final    Comment: Performed at [facility], [place] [address] [place], [place] [address]  Gastrointestinal Panel by PCR , Stool     Status: Abnormal   Collection Time: 02/16/18  4:16 AM  Result Value Ref Range Status   Campylobacter species NOT DETECTED NOT DETECTED Final   Plesimonas shigelloides NOT DETECTED NOT DETECTED Final   Salmonella species NOT DETECTED NOT DETECTED Final   Yersinia enterocolitica NOT DETECTED NOT DETECTED Final   Vibrio species NOT DETECTED NOT DETECTED Final   Vibrio cholerae NOT DETECTED NOT DETECTED Final   Enteroaggregative E coli (EAEC) DETECTED (A) NOT DETECTED Final    Comment: RESULT CALLED TO, READ BACK BY AND VERIFIED WITH: AARON MCAULEY ON 02/16/18 AT 1450 KMP    Enteropathogenic E coli (EPEC) NOT  DETECTED NOT DETECTED Final   Enterotoxigenic E coli (ETEC) NOT DETECTED NOT DETECTED Final   Shiga like toxin producing E coli (STEC) NOT DETECTED NOT DETECTED Final   Shigella/Enteroinvasive E coli (EIEC) NOT DETECTED NOT DETECTED Final   Cryptosporidium NOT DETECTED NOT DETECTED Final   Cyclospora cayetanensis NOT DETECTED NOT DETECTED Final   Entamoeba histolytica NOT DETECTED NOT DETECTED Final   Giardia lamblia NOT DETECTED NOT DETECTED Final   Adenovirus F40/41 NOT DETECTED NOT DETECTED Final   Astrovirus NOT DETECTED NOT DETECTED Final   Norovirus GI/GII NOT DETECTED NOT DETECTED Final   Rotavirus A NOT DETECTED NOT DETECTED Final   Sapovirus (I, II, IV, and V) NOT DETECTED NOT DETECTED Final    Comment: Performed at [facility], [place] [place], [place] [address]  MRSA PCR Screening     Status: Abnormal   Collection Time: 02/16/18  2:31 PM  Result Value Ref Range Status   MRSA by PCR POSITIVE (A) NEGATIVE Final    Comment:        The GeneXpert MRSA Assay (FDA approved for NASAL specimens only), is one component of a comprehensive MRSA colonization surveillance program. It is not intended to diagnose MRSA infection nor to guide or monitor treatment for MRSA infections. RESULT CALLED TO, READ BACK BY AND VERIFIED WITHHeron Sabins RN 02/16/18 1834 JDW Performed at [facility], [place] [address], [place], [place] [address]   Culture, blood (routine x 2) Call MD if unable to obtain prior to antibiotics being given     Status: None   Collection Time: 02/17/18  1:00 PM  Result Value Ref Range Status   Specimen Description BLOOD LEFT ANTECUBITAL  Final   Special Requests   Final    BOTTLES DRAWN AEROBIC ONLY Blood Culture adequate volume   Culture   Final    NO GROWTH 5 DAYS Performed at [facility], [place] [address] [place], [place] [address]    Report Status 02/22/2018 FINAL  Final  Culture, blood (routine x 2) Call MD if unable to obtain  prior to antibiotics being given     Status: None   Collection Time: 02/17/18  1:20 PM  Result Value Ref Range Status   Specimen Description BLOOD RIGHT ANTECUBITAL  Final   Special Requests   Final    BOTTLES DRAWN AEROBIC ONLY Blood Culture adequate volume   Culture   Final    NO GROWTH 5 DAYS Performed at [facility], [place] [address], [place], [place] [address]    Report Status 02/22/2018 FINAL  Final    Today  Subjective    Nathan [name] today has no headache,no chest abdominal pain,no new weakness tingling or numbness, feels much better wants to go home today.     Objective   Blood pressure 129/84, pulse 95, temperature 98.1 F (36.7 C), temperature source Oral, resp. rate 17, height 5\' 9"  (1.753 m), weight 84.2 kg, SpO2 99 %.   Intake/Output Summary (Last 24 hours) at 02/24/2018 1044 Last data filed at 02/24/2018 0550 Gross per 24 hour  Intake 788.25 ml  Output 1951 ml  Net -1162.75 ml    Exam Awake Alert ,No new F.N deficits, Normal affect [place].AT,PERRAL Supple Neck,No JVD, No cervical lymphadenopathy appriciated.  Symmetrical Chest wall movement, Good air movement bilaterally, CTAB RRR,No Gallops,Rubs or new Murmurs, No Parasternal Heave +ve B.Sounds, Abd Soft, Non tender, No organomegaly appriciated, No rebound -guarding or rigidity. No Cyanosis, Clubbing or edema, No new Rash or bruise   Data Review   CBC w Diff:  Lab Results  Component Value Date   WBC 7.4 02/22/2018   HGB 10.1 (L) 02/22/2018   HCT 32.2 (L) 02/22/2018   PLT 160 02/22/2018   LYMPHOPCT 24 02/22/2018   BANDSPCT 0 02/22/2018   MONOPCT 17 02/22/2018   EOSPCT 8 02/22/2018   BASOPCT 0 02/22/2018    CMP:  Lab Results  Component Value Date   NA 144 02/22/2018   K 3.7 02/22/2018   CL 111 02/22/2018   CO2 27 02/22/2018   BUN 11 02/22/2018   CREATININE 1.71 (H) 02/22/2018   CREATININE 1.91 (H) 07/21/2017   PROT 5.6 (L) 02/22/2018   ALBUMIN 2.4 (L) 02/22/2018   BILITOT 0.3  02/22/2018   ALKPHOS 34 (L) 02/22/2018   AST 29 02/22/2018   ALT 22 02/22/2018  .   Total Time in preparing paper work, data evaluation and todays exam - 43 minutes  Lala Lund M.D on 02/24/2018 at 10:44 AM  [facility]   Office  [phone]

## 2018-03-03 ENCOUNTER — Ambulatory Visit (INDEPENDENT_AMBULATORY_CARE_PROVIDER_SITE_OTHER): Payer: Medicare Other | Admitting: Neurology

## 2018-03-03 DIAGNOSIS — R569 Unspecified convulsions: Secondary | ICD-10-CM

## 2018-03-03 DIAGNOSIS — G4089 Other seizures: Secondary | ICD-10-CM | POA: Diagnosis not present

## 2018-03-13 NOTE — Procedures (Signed)
   HISTORY: 57 year old male with history of HIV, polypharmacy treatment, had a seizure  TECHNIQUE:  This is a routine 16 channel EEG recording with one channel devoted to a limited EKG recording.  It was performed during wakefulness, drowsiness and asleep.  Hyperventilation and photic stimulation were performed as activating procedures.  There are minimum muscle and movement artifact noted.  Upon maximum arousal, posterior dominant waking rhythm consistent of mildly dysarrhythmic theta range activity, with frequency of 7 hz. Activities are symmetric over the bilateral posterior derivations and attenuated with eye opening.  Hyperventilation produced mild/moderate buildup with higher amplitude and the slower activities noted.  Photic stimulation did not alter the tracing.  During EEG recording, patient developed drowsiness and entered sleep, sleep EEG demonstrated architecture, there were frontal centrally dominant vertex waves and symmetric sleep spindles noted.  During EEG recording, there was no epileptiform discharge noted.  EKG demonstrate sinus rhythm, with heart rate of 90 bpm  CONCLUSION: This is a mild abnormal EEG.  There is no electrodiagnostic evidence of epileptiform discharge, there is evidence of mild background slowing consistent with mild bihemisphere malfunction, common etiology are metabolic toxic.  Marcial Pacas, M.D. Ph.D.  [facility] [place], [place] [address] Phone: [phone] Fax:      [phone]

## 2018-03-16 ENCOUNTER — Telehealth: Payer: Self-pay | Admitting: *Deleted

## 2018-03-16 NOTE — Telephone Encounter (Signed)
-----   Message from Marcial Pacas, MD sent at 03/16/2018  8:13 AM EST ----- Please call patient for mild abnormal EEG, with mild background slowing, but no evidence of epileptic form discharges.

## 2018-03-16 NOTE — Telephone Encounter (Signed)
Tried [phone], went to automated that VM full and could not leave message. LVM For Nathan [name] (on [place]) to call about EEG results

## 2018-03-17 ENCOUNTER — Telehealth: Payer: Self-pay | Admitting: *Deleted

## 2018-03-17 NOTE — Telephone Encounter (Signed)
Cheri called back about results. Relayed results per Dr. Krista Blue note. She verbalized understanding and appreciation for call.

## 2018-03-17 NOTE — Telephone Encounter (Signed)
Cheri returning RNs call- call dropped

## 2018-03-17 NOTE — Telephone Encounter (Signed)
-----   Message from Marcial Pacas, MD sent at 03/16/2018  8:13 AM EST ----- Please call patient for mild abnormal EEG, with mild background slowing, but no evidence of epileptic form discharges.

## 2018-03-17 NOTE — Telephone Encounter (Signed)
Called, LVM For Cheri to call.

## 2018-03-17 NOTE — Telephone Encounter (Signed)
Spoke to [facility] Dellie Catholic) on [place].  She is aware of his EEG results.

## 2018-04-14 ENCOUNTER — Other Ambulatory Visit: Payer: Medicare Other

## 2018-04-17 ENCOUNTER — Observation Stay (HOSPITAL_COMMUNITY)
Admission: EM | Admit: 2018-04-17 | Discharge: 2018-04-19 | Disposition: A | Discharge status: Home / Self Care | Payer: Medicare Other | Attending: Internal Medicine | Admitting: Internal Medicine

## 2018-04-17 ENCOUNTER — Encounter (HOSPITAL_COMMUNITY): Payer: Self-pay | Admitting: *Deleted

## 2018-04-17 ENCOUNTER — Emergency Department (HOSPITAL_COMMUNITY): Payer: Medicare Other

## 2018-04-17 ENCOUNTER — Other Ambulatory Visit: Payer: Self-pay

## 2018-04-17 DIAGNOSIS — Z79899 Other long term (current) drug therapy: Secondary | ICD-10-CM | POA: Diagnosis not present

## 2018-04-17 DIAGNOSIS — Z7984 Long term (current) use of oral hypoglycemic drugs: Secondary | ICD-10-CM | POA: Insufficient documentation

## 2018-04-17 DIAGNOSIS — F70 Mild intellectual disabilities: Secondary | ICD-10-CM | POA: Insufficient documentation

## 2018-04-17 DIAGNOSIS — F3162 Bipolar disorder, current episode mixed, moderate: Secondary | ICD-10-CM | POA: Diagnosis not present

## 2018-04-17 DIAGNOSIS — F209 Schizophrenia, unspecified: Secondary | ICD-10-CM | POA: Insufficient documentation

## 2018-04-17 DIAGNOSIS — G9340 Encephalopathy, unspecified: Secondary | ICD-10-CM | POA: Diagnosis not present

## 2018-04-17 DIAGNOSIS — R569 Unspecified convulsions: Secondary | ICD-10-CM

## 2018-04-17 DIAGNOSIS — R5383 Other fatigue: Secondary | ICD-10-CM | POA: Diagnosis present

## 2018-04-17 DIAGNOSIS — F79 Unspecified intellectual disabilities: Secondary | ICD-10-CM

## 2018-04-17 DIAGNOSIS — Z7982 Long term (current) use of aspirin: Secondary | ICD-10-CM | POA: Diagnosis not present

## 2018-04-17 DIAGNOSIS — B20 Human immunodeficiency virus [HIV] disease: Secondary | ICD-10-CM | POA: Insufficient documentation

## 2018-04-17 DIAGNOSIS — Z23 Encounter for immunization: Secondary | ICD-10-CM | POA: Diagnosis not present

## 2018-04-17 DIAGNOSIS — N183 Chronic kidney disease, stage 3 unspecified: Secondary | ICD-10-CM | POA: Diagnosis present

## 2018-04-17 LAB — VALPROIC ACID LEVEL: Valproic Acid Lvl: 55 ug/mL (ref 50.0–100.0)

## 2018-04-17 LAB — CBC WITH DIFFERENTIAL/PLATELET
Abs Immature Granulocytes: 0.02 10*3/uL (ref 0.00–0.07)
Basophils Absolute: 0 10*3/uL (ref 0.0–0.1)
Basophils Relative: 0 %
Eosinophils Absolute: 0.3 10*3/uL (ref 0.0–0.5)
Eosinophils Relative: 4 %
HCT: 39.3 % (ref 39.0–52.0)
Hemoglobin: 12 g/dL — ABNORMAL LOW (ref 13.0–17.0)
Immature Granulocytes: 0 %
Lymphocytes Relative: 30 %
Lymphs Abs: 2.2 10*3/uL (ref 0.7–4.0)
MCH: 28.4 pg (ref 26.0–34.0)
MCHC: 30.5 g/dL (ref 30.0–36.0)
MCV: 93.1 fL (ref 80.0–100.0)
Monocytes Absolute: 0.7 10*3/uL (ref 0.1–1.0)
Monocytes Relative: 10 %
Neutro Abs: 4.1 10*3/uL (ref 1.7–7.7)
Neutrophils Relative %: 56 %
Platelets: 138 10*3/uL — ABNORMAL LOW (ref 150–400)
RBC: 4.22 MIL/uL (ref 4.22–5.81)
RDW: 13.8 % (ref 11.5–15.5)
WBC: 7.4 10*3/uL (ref 4.0–10.5)
nRBC: 0 % (ref 0.0–0.2)

## 2018-04-17 LAB — COMPREHENSIVE METABOLIC PANEL
ALT: 11 U/L (ref 0–44)
AST: 15 U/L (ref 15–41)
Albumin: 3.6 g/dL (ref 3.5–5.0)
Alkaline Phosphatase: 46 U/L (ref 38–126)
Anion gap: 8 (ref 5–15)
BUN: 19 mg/dL (ref 6–20)
CO2: 27 mmol/L (ref 22–32)
Calcium: 9.1 mg/dL (ref 8.9–10.3)
Chloride: 104 mmol/L (ref 98–111)
Creatinine, Ser: 1.9 mg/dL — ABNORMAL HIGH (ref 0.61–1.24)
GFR calc Af Amer: 44 mL/min — ABNORMAL LOW (ref >60)
GFR calc non Af Amer: 38 mL/min — ABNORMAL LOW (ref >60)
Glucose, Bld: 113 mg/dL — ABNORMAL HIGH (ref 70–99)
Potassium: 3.6 mmol/L (ref 3.5–5.1)
Sodium: 139 mmol/L (ref 135–145)
Total Bilirubin: 0.5 mg/dL (ref 0.3–1.2)
Total Protein: 7.3 g/dL (ref 6.5–8.1)

## 2018-04-17 LAB — ETHANOL: Alcohol, Ethyl (B): <10 mg/dL (ref <10)

## 2018-04-17 LAB — AMMONIA: Ammonia: 20 umol/L (ref 9–35)

## 2018-04-17 MED ORDER — NALOXONE HCL 0.4 MG/ML IJ SOLN
0.2000 mg | Freq: Once | INTRAMUSCULAR | Status: AC
  Administered 2018-04-17: 0.2 mg via INTRAVENOUS
  Filled 2018-04-17: qty 1

## 2018-04-17 NOTE — ED Notes (Signed)
The person with the group home has left for awhile  She will return

## 2018-04-17 NOTE — ED Notes (Signed)
If pt gets discharged, call caregiver, Vaughan Basta at [phone], she will be the person to pick him up

## 2018-04-17 NOTE — ED Provider Notes (Signed)
[facility] EMERGENCY DEPARTMENT Provider Note   CSN: [id number] Arrival date & time: 04/17/18  1900     History   Chief Complaint Chief Complaint  Patient presents with  . Fatigue   Level 5 caveat due to altered mental status. HPI Nathan [name] is a 58 y.o. male.  HPI Patient brought in for altered mental status.  For the last 2 to 3 days apparently his been talking to himself and more manic.  History of schizophrenia.  Today became much less responsive.  Will basically just lie there.  Nonverbal.  No recent changes in medication.  No fevers.  Has had good oral intake up until today.  There is a note from the patient's doctor saying that the patient's Clazuril will need to be adjusted or stopped. Past Medical History:  Diagnosis Date  . Anemia   . Anemia   . Anxiety   . Bipolar 1 disorder ([place])   . Depression, major   . HIV (human immunodeficiency virus infection) ([place])   . Hyperglycemia 08/06/2017  . Hypernatremia   . Kidney failure   . Lithium toxicity   . MR (mental retardation)   . Schizoaffective disorder ([place]) 08/06/2017  . Schizophrenia ([employer] [facility])   . Seizures [facility])     Patient Active Problem List   Diagnosis Date Noted  . Sepsis due to pneumonia ([place]) 02/16/2018  . Diarrhea 02/16/2018  . Gait abnormality 02/09/2018  . Hyperglycemia 08/06/2017  . Schizoaffective disorder ([place]) 08/06/2017  . Bipolar 1 disorder ([place])   . Schizophrenia ([place])   . Seizures ([place])   . Altered mental state 07/20/2016  . Aggressive behavior   . GERD (gastroesophageal reflux disease) 07/25/2014  . Acute encephalopathy 06/23/2014  . Hypernatremia 06/20/2014  . Dehydration 06/18/2014  . Lithium toxicity 06/18/2014  . ARF (acute renal failure) ([place]) 06/18/2014  . Acute urinary retention 06/18/2014  . CAP (community acquired pneumonia) 06/18/2014  . Encounter for screening colonoscopy 03/25/2014  . Dysphagia, pharyngoesophageal phase 03/25/2014  . CKD (chronic  kidney disease) stage 3, GFR 30-59 ml/min (HCC) 07/22/2012  . HIV disease ([place]) 06/19/2012  . Bipolar 1 disorder, mixed, moderate ([place]) 06/19/2012  . Hyponatremia 06/19/2012  . Intellectual disability 06/19/2012    Past Surgical History:  Procedure Laterality Date  . BIOPSY N/A 04/12/2014   Procedure: BIOPSY;  Surgeon: Danie Binder, MD;  Location: [facility]S;  Service: Endoscopy;  Laterality: N/A;  Gastric  . COLONOSCOPY WITH PROPOFOL N/A 04/12/2014   SLF:  1. one colon polyp removed 2. The left colon is extremely redundant 3. Small internal hemorrhoids  . ESOPHAGOGASTRODUODENOSCOPY (EGD) WITH PROPOFOL N/A 04/12/2014   SLF: Stricture at the gastroesophageal junction 2. small polyp  in teh gastric body 3. Mild Non-erosive gastritis.   Marland Kitchen NOSE SURGERY  1968   unsure what type of surgery. something to help him breathe better  . POLYPECTOMY N/A 04/12/2014   Procedure: POLYPECTOMY;  Surgeon: Danie Binder, MD;  Location: [facility];  Service: Endoscopy;  Laterality: N/A;  Cecal  . SAVORY DILATION N/A 04/12/2014   Procedure: SAVORY DILATION;  Surgeon: Danie Binder, MD;  Location: [facility];  Service: Endoscopy;  Laterality: N/A;  12.8/ 14/15/16  . SKIN BIOPSY          Home Medications    Prior to Admission medications   Medication Sig Start Date End Date Taking? Authorizing Provider  aspirin 81 MG chewable tablet Chew 81 mg by mouth daily.   Yes [provider]  atorvastatin (LIPITOR) 20 MG tablet Take 20 mg by mouth daily at 6 PM.   Yes [provider]  benztropine (COGENTIN) 2 MG tablet Take 2 mg by mouth 2 (two) times daily.    Yes [provider]  bictegravir-emtricitabine-tenofovir AF (BIKTARVY) 50-200-25 MG TABS tablet Take 1 tablet by mouth daily. 08/06/17  Yes Tommy Medal, Lavell Islam, MD  Cholecalciferol (VITAMIN D3 PO) Take 1 tablet by mouth daily.   Yes [provider]  clozapine (FAZACLO) 100 MG disintegrating tablet Take 50-100 mg by mouth See admin  instructions. 100mg  in the morning and 50mg  at bedtime   Yes [provider]  folic acid (FOLVITE) 1 MG tablet Take 1 mg by mouth daily.   Yes [provider]  gabapentin (NEURONTIN) 300 MG capsule Take 300 mg by mouth every morning. Take with the 400mg  tablet to equal a total of 700mg  in the mornings.   Yes [provider]  gabapentin (NEURONTIN) 400 MG capsule Take 400 mg by mouth 2 (two) times daily.    Yes [provider]  haloperidol (HALDOL) 5 MG tablet Take 2 tablets (10 mg total) by mouth daily with supper. Patient taking differently: Take 5 mg by mouth daily with supper.  07/21/16  Yes Rogue Bussing, MD  haloperidol decanoate (HALDOL DECANOATE) 50 MG/ML injection Inject 50 mg into the muscle every 28 (twenty-eight) days.   Yes [provider]  levETIRAcetam (KEPPRA) 500 MG tablet Take 1 tablet (500 mg total) by mouth 2 (two) times daily. 02/09/18  Yes Marcial Pacas, MD  metFORMIN (GLUCOPHAGE-XR) 500 MG 24 hr tablet Take 500 mg by mouth 2 (two) times daily.   Yes [provider]  nystatin (MYCOSTATIN/NYSTOP) powder Apply topically 4 (four) times daily.   Yes [provider]  polyethylene glycol (MIRALAX / GLYCOLAX) packet Take 17 g by mouth daily. 02/24/18  Yes Thurnell Lose, MD  ranitidine (ZANTAC) 150 MG capsule Take 150 mg by mouth 2 (two) times daily.   Yes [provider]  senna (SENOKOT) 8.6 MG tablet Take 2 tablets by mouth 2 (two) times daily.   Yes [provider]  terbinafine (LAMISIL) 250 MG tablet Take 250 mg by mouth daily.   Yes [provider]  valproic acid (DEPAKENE) 250 MG capsule Take 2 capsules every morning then take 4 capsules at bedtime Patient taking differently: Take 500-1,000 mg by mouth See admin instructions. Take 2 capsules (500mg ) every morning then take 4 capsules (1000mg ) at bedtime 02/09/18  Yes Marcial Pacas, MD  cetirizine (ZYRTEC) 10 MG tablet TAKE 1 TABLET ONCE  DAILY. Patient not taking: No sig reported 01/21/18   Delia Chimes A, MD  ranitidine (ZANTAC) 150 MG tablet TAKE 1 TABLET BY MOUTH 12 HOURS AFTER TAKING REYATAZ. Patient not taking: No sig reported 10/31/12   Tommy Medal, Lavell Islam, MD    Family History Family History  Problem Relation Age of Onset  . Other Mother        unknown medical history  . Other Father        unknown medical history  . Colon cancer Neg Hx     Social History Social History   Tobacco Use  . Smoking status: Never Smoker  . Smokeless tobacco: Never Used  Substance Use Topics  . Alcohol use: No    Alcohol/week: 0.0 standard drinks  . Drug use: No     Allergies   Patient has no known allergies.  Review of Systems Review of Systems  Unable to perform ROS: Mental status change     Physical Exam Updated Vital Signs BP 108/70   Pulse 71   Temp 98.1 F (36.7 C)   Resp 18   SpO2 98%   Physical Exam Constitutional:      General: He is not in acute distress. HENT:     Head: Atraumatic.     Mouth/Throat:     Mouth: Mucous membranes are moist.  Eyes:     Comments: Pupils constricted bilaterally.  Neck:     Musculoskeletal: Neck supple.  Cardiovascular:     Rate and Rhythm: Regular rhythm.  Pulmonary:     Breath sounds: No wheezing, rhonchi or rales.  Chest:     Chest wall: No tenderness.  Abdominal:     Tenderness: There is no abdominal tenderness.  Musculoskeletal:        General: No tenderness or deformity.  Skin:    General: Skin is warm.     Capillary Refill: Capillary refill takes less than 2 seconds.     Coloration: Skin is not jaundiced.  Neurological:     Comments: Sitting in bed breathing spontaneously.  Eyes closed.  Nonverbal.  Minimal arousal to stimulation but does have gag reflex intact.  When arm held up over his face and dropped it will hit him in the head bilaterally.  No clonus.      ED Treatments / Results  Labs (all labs ordered are listed, but only abnormal  results are displayed) Labs Reviewed  CBC WITH DIFFERENTIAL/PLATELET - Abnormal; Notable for the following components:      Result Value   Hemoglobin 12.0 (*)    Platelets 138 (*)    All other components within normal limits  COMPREHENSIVE METABOLIC PANEL - Abnormal; Notable for the following components:   Glucose, Bld 113 (*)    Creatinine, Ser 1.90 (*)    GFR calc non Af Amer 38 (*)    GFR calc Af Amer 44 (*)    All other components within normal limits  ETHANOL  AMMONIA  VALPROIC ACID LEVEL  RAPID URINE DRUG SCREEN, HOSP PERFORMED  URINALYSIS, ROUTINE W REFLEX MICROSCOPIC    EKG EKG Interpretation  Date/Time:  Friday April 17 2018 21:00:45 EST Ventricular Rate:  71 PR Interval:    QRS Duration: 92 QT Interval:  385 QTC Calculation: 419 R Axis:   59 Text Interpretation:  Sinus rhythm Borderline ST elevation, inferior leads Baseline wander in lead(s) V6 Confirmed by Davonna Belling [phone]) on 04/17/2018 11:30:01 PM   Radiology Dg Chest Portable 1 View  Result Date: 04/17/2018 CLINICAL DATA:  Mental status changes. EXAM: PORTABLE CHEST 1 VIEW COMPARISON:  02/19/2018 FINDINGS: 2107 hours. Low volumes. Cardiopericardial silhouette is at upper limits of normal for size. Bibasilar opacity, left greater than right compatible with atelectasis or infiltrate. The visualized bony structures of the thorax are intact. Telemetry leads overlie the chest. IMPRESSION: Bibasilar atelectasis or infiltrate, left greater than right. Electronically Signed   By: Misty Stanley M.D.   On: 04/17/2018 21:28    Procedures Procedures (including critical care time)  Medications Ordered in ED Medications  naloxone [facility]) injection 0.2 mg (0.2 mg Intravenous Given 04/17/18 2153)     Initial Impression / Assessment and Plan / ED Course  I have reviewed the triage vital signs and the nursing notes.  Pertinent labs & imaging results that were available during my care of the patient were  reviewed by  me and considered in my medical decision making (see chart for details).     Patient brought in for mental status changes.  Had been rather manic and appeared reactive schizophrenia the last couple days but today much less responsive.  Initially unresponsive but is now improved somewhat.  Will say a word or 2 but reportedly his baseline is talking normally.  Labs overall reassuring.  Creatinine mildly elevated.  Doubt this is a seizure.  I think patient would benefit from [place] the hospital.  Could be a psychiatric component or medication effects.    Final Clinical Impressions(s) / ED Diagnoses   Final diagnoses:  Encephalopathy    ED Discharge Orders    None       Davonna Belling, MD 04/17/18 2344

## 2018-04-17 NOTE — ED Notes (Signed)
No response from the narcan

## 2018-04-17 NOTE — ED Notes (Signed)
The pt arrived asleep in the wheelchair  He lives in a group home and the person who runs the home is with him.  She reports that he was manic earlier then took his prescribed meds.  He had to be helped out  Of the chair and tumbled into the bed

## 2018-04-17 NOTE — ED Notes (Signed)
2 unsuccessful attempts to start  An iv

## 2018-04-17 NOTE — ED Triage Notes (Signed)
Pt is from a group home, brought in by caregiver. Caregiver reports pt has been having manic episodes (hx of schizophrenia) for the past two days where he's been very talkative and running around a lot. Today pt more lethargic and tired. Pt difficulty to get information from at present, denies pain.

## 2018-04-18 ENCOUNTER — Encounter (HOSPITAL_COMMUNITY): Payer: Self-pay | Admitting: Internal Medicine

## 2018-04-18 ENCOUNTER — Other Ambulatory Visit: Payer: Self-pay

## 2018-04-18 ENCOUNTER — Observation Stay (HOSPITAL_COMMUNITY): Payer: Medicare Other

## 2018-04-18 DIAGNOSIS — G9340 Encephalopathy, unspecified: Secondary | ICD-10-CM

## 2018-04-18 DIAGNOSIS — B20 Human immunodeficiency virus [HIV] disease: Secondary | ICD-10-CM

## 2018-04-18 DIAGNOSIS — R451 Restlessness and agitation: Secondary | ICD-10-CM

## 2018-04-18 DIAGNOSIS — F209 Schizophrenia, unspecified: Secondary | ICD-10-CM

## 2018-04-18 DIAGNOSIS — F3162 Bipolar disorder, current episode mixed, moderate: Secondary | ICD-10-CM

## 2018-04-18 DIAGNOSIS — F3130 Bipolar disorder, current episode depressed, mild or moderate severity, unspecified: Secondary | ICD-10-CM | POA: Diagnosis not present

## 2018-04-18 DIAGNOSIS — F79 Unspecified intellectual disabilities: Secondary | ICD-10-CM

## 2018-04-18 DIAGNOSIS — N183 Chronic kidney disease, stage 3 (moderate): Secondary | ICD-10-CM

## 2018-04-18 LAB — BASIC METABOLIC PANEL
Anion gap: 12 (ref 5–15)
BUN: 19 mg/dL (ref 6–20)
CHLORIDE: 103 mmol/L (ref 98–111)
CO2: 27 mmol/L (ref 22–32)
Calcium: 9.2 mg/dL (ref 8.9–10.3)
Creatinine, Ser: 1.66 mg/dL — ABNORMAL HIGH (ref 0.61–1.24)
GFR calc Af Amer: 52 mL/min — ABNORMAL LOW (ref >60)
GFR calc non Af Amer: 44 mL/min — ABNORMAL LOW (ref >60)
Glucose, Bld: 155 mg/dL — ABNORMAL HIGH (ref 70–99)
Potassium: 3.3 mmol/L — ABNORMAL LOW (ref 3.5–5.1)
Sodium: 142 mmol/L (ref 135–145)

## 2018-04-18 LAB — GLUCOSE, CAPILLARY
Glucose-Capillary: 88 mg/dL (ref 70–99)
Glucose-Capillary: 112 mg/dL — ABNORMAL HIGH (ref 70–99)
Glucose-Capillary: 145 mg/dL — ABNORMAL HIGH (ref 70–99)
Glucose-Capillary: 117 mg/dL — ABNORMAL HIGH (ref 70–99)

## 2018-04-18 LAB — CBC
HCT: 38.9 % — ABNORMAL LOW (ref 39.0–52.0)
Hemoglobin: 12.1 g/dL — ABNORMAL LOW (ref 13.0–17.0)
MCH: 28.7 pg (ref 26.0–34.0)
MCHC: 31.1 g/dL (ref 30.0–36.0)
MCV: 92.2 fL (ref 80.0–100.0)
Platelets: 133 10*3/uL — ABNORMAL LOW (ref 150–400)
RBC: 4.22 MIL/uL (ref 4.22–5.81)
RDW: 13.9 % (ref 11.5–15.5)
WBC: 6.3 10*3/uL (ref 4.0–10.5)
nRBC: 0 % (ref 0.0–0.2)

## 2018-04-18 LAB — URINALYSIS, ROUTINE W REFLEX MICROSCOPIC
Bilirubin Urine: NEGATIVE
GLUCOSE, UA: 50 mg/dL — AB
Hgb urine dipstick: NEGATIVE
Ketones, ur: NEGATIVE mg/dL
Leukocytes, UA: NEGATIVE
Nitrite: NEGATIVE
PROTEIN: NEGATIVE mg/dL
Specific Gravity, Urine: 1.015 (ref 1.005–1.030)
pH: 5 (ref 5.0–8.0)

## 2018-04-18 LAB — TROPONIN I: Troponin I: <0.03 ng/mL (ref <0.03)

## 2018-04-18 LAB — MRSA PCR SCREENING: MRSA by PCR: NEGATIVE

## 2018-04-18 LAB — RAPID URINE DRUG SCREEN, HOSP PERFORMED
Amphetamines: NOT DETECTED
Barbiturates: NOT DETECTED
Benzodiazepines: NOT DETECTED
Cocaine: NOT DETECTED
Opiates: NOT DETECTED
Tetrahydrocannabinol: NOT DETECTED

## 2018-04-18 LAB — CBG MONITORING, ED: Glucose-Capillary: 97 mg/dL (ref 70–99)

## 2018-04-18 MED ORDER — CLOZAPINE 25 MG PO TABS
50.0000 mg | ORAL_TABLET | Freq: Every day | ORAL | Status: DC
  Administered 2018-04-18: 50 mg via ORAL
  Filled 2018-04-18 (×2): qty 2

## 2018-04-18 MED ORDER — INSULIN ASPART 100 UNIT/ML ~~LOC~~ SOLN
0.0000 [IU] | Freq: Three times a day (TID) | SUBCUTANEOUS | Status: DC
  Administered 2018-04-18: 1 [IU] via SUBCUTANEOUS
  Administered 2018-04-19: 2 [IU] via SUBCUTANEOUS

## 2018-04-18 MED ORDER — GABAPENTIN 400 MG PO CAPS
400.0000 mg | ORAL_CAPSULE | Freq: Every day | ORAL | Status: DC
  Administered 2018-04-18 (×2): 400 mg via ORAL
  Filled 2018-04-18 (×3): qty 1

## 2018-04-18 MED ORDER — VALPROIC ACID 250 MG PO CAPS
1000.0000 mg | ORAL_CAPSULE | Freq: Every day | ORAL | Status: DC
  Administered 2018-04-18: 1000 mg via ORAL
  Filled 2018-04-18: qty 4

## 2018-04-18 MED ORDER — ACETAMINOPHEN 650 MG RE SUPP: 650.0000 mg | Freq: Four times a day (QID) | RECTAL | Status: DC | PRN

## 2018-04-18 MED ORDER — ACETAMINOPHEN 325 MG PO TABS: 650.0000 mg | ORAL_TABLET | Freq: Four times a day (QID) | ORAL | Status: DC | PRN

## 2018-04-18 MED ORDER — FAMOTIDINE 20 MG PO TABS
20.0000 mg | ORAL_TABLET | Freq: Two times a day (BID) | ORAL | Status: DC
  Administered 2018-04-18 – 2018-04-19 (×4): 20 mg via ORAL
  Filled 2018-04-18 (×5): qty 1

## 2018-04-18 MED ORDER — INFLUENZA VAC SPLIT QUAD 0.5 ML IM SUSY
0.5000 mL | PREFILLED_SYRINGE | INTRAMUSCULAR | Status: AC
  Administered 2018-04-19: 0.5 mL via INTRAMUSCULAR
  Filled 2018-04-18: qty 0.5

## 2018-04-18 MED ORDER — LEVETIRACETAM IN NACL 500 MG/100ML IV SOLN
500.0000 mg | Freq: Two times a day (BID) | INTRAVENOUS | Status: DC
  Administered 2018-04-18 (×2): 500 mg via INTRAVENOUS
  Filled 2018-04-18 (×2): qty 100

## 2018-04-18 MED ORDER — GABAPENTIN 400 MG PO CAPS
700.0000 mg | ORAL_CAPSULE | Freq: Every day | ORAL | Status: DC
  Administered 2018-04-18 – 2018-04-19 (×2): 700 mg via ORAL
  Filled 2018-04-18 (×2): qty 1

## 2018-04-18 MED ORDER — POLYETHYLENE GLYCOL 3350 17 G PO PACK
17.0000 g | PACK | Freq: Every day | ORAL | Status: DC
  Administered 2018-04-18 – 2018-04-19 (×2): 17 g via ORAL
  Filled 2018-04-18 (×2): qty 1

## 2018-04-18 MED ORDER — VALPROIC ACID 250 MG PO CAPS
500.0000 mg | ORAL_CAPSULE | Freq: Every day | ORAL | Status: DC
  Administered 2018-04-18 – 2018-04-19 (×2): 500 mg via ORAL
  Filled 2018-04-18 (×2): qty 2

## 2018-04-18 MED ORDER — SENNA 8.6 MG PO TABS
2.0000 | ORAL_TABLET | Freq: Two times a day (BID) | ORAL | Status: DC
  Administered 2018-04-18 – 2018-04-19 (×3): 17.2 mg via ORAL
  Filled 2018-04-18 (×3): qty 2

## 2018-04-18 MED ORDER — GABAPENTIN 300 MG PO CAPS: 300.0000 mg | ORAL_CAPSULE | ORAL | Status: DC

## 2018-04-18 MED ORDER — CLOZAPINE 100 MG PO TABS
100.0000 mg | ORAL_TABLET | Freq: Every day | ORAL | Status: DC
  Administered 2018-04-18 – 2018-04-19 (×2): 100 mg via ORAL
  Filled 2018-04-18 (×2): qty 1

## 2018-04-18 MED ORDER — FOLIC ACID 1 MG PO TABS
1.0000 mg | ORAL_TABLET | Freq: Every day | ORAL | Status: DC
  Administered 2018-04-18 – 2018-04-19 (×2): 1 mg via ORAL
  Filled 2018-04-18 (×2): qty 1

## 2018-04-18 MED ORDER — ASPIRIN 81 MG PO CHEW
81.0000 mg | CHEWABLE_TABLET | Freq: Every day | ORAL | Status: DC
  Administered 2018-04-18 – 2018-04-19 (×2): 81 mg via ORAL
  Filled 2018-04-18 (×2): qty 1

## 2018-04-18 MED ORDER — BICTEGRAVIR-EMTRICITAB-TENOFOV 50-200-25 MG PO TABS
1.0000 | ORAL_TABLET | Freq: Every day | ORAL | Status: DC
  Administered 2018-04-18 – 2018-04-19 (×2): 1 via ORAL
  Filled 2018-04-18 (×2): qty 1

## 2018-04-18 MED ORDER — ONDANSETRON HCL 4 MG/2ML IJ SOLN: 4.0000 mg | Freq: Four times a day (QID) | INTRAMUSCULAR | Status: DC | PRN

## 2018-04-18 MED ORDER — CLOZAPINE 100 MG PO TBDP: 50.0000 mg | ORAL_TABLET | ORAL | Status: DC

## 2018-04-18 MED ORDER — ONDANSETRON HCL 4 MG PO TABS: 4.0000 mg | ORAL_TABLET | Freq: Four times a day (QID) | ORAL | Status: DC | PRN

## 2018-04-18 MED ORDER — BENZTROPINE MESYLATE 0.5 MG PO TABS
2.0000 mg | ORAL_TABLET | Freq: Two times a day (BID) | ORAL | Status: DC
  Administered 2018-04-18 – 2018-04-19 (×4): 2 mg via ORAL
  Filled 2018-04-18 (×5): qty 4

## 2018-04-18 MED ORDER — TERBINAFINE HCL 250 MG PO TABS
250.0000 mg | ORAL_TABLET | Freq: Every day | ORAL | Status: DC
  Administered 2018-04-18 – 2018-04-19 (×2): 250 mg via ORAL
  Filled 2018-04-18 (×2): qty 1

## 2018-04-18 MED ORDER — ATORVASTATIN CALCIUM 10 MG PO TABS
20.0000 mg | ORAL_TABLET | Freq: Every day | ORAL | Status: DC
  Administered 2018-04-18: 20 mg via ORAL
  Filled 2018-04-18: qty 2

## 2018-04-18 MED ORDER — HALOPERIDOL 5 MG PO TABS
5.0000 mg | ORAL_TABLET | Freq: Every day | ORAL | Status: DC
  Administered 2018-04-18: 5 mg via ORAL
  Filled 2018-04-18 (×2): qty 1

## 2018-04-18 MED ORDER — LEVETIRACETAM 500 MG PO TABS
500.0000 mg | ORAL_TABLET | Freq: Two times a day (BID) | ORAL | Status: DC
  Administered 2018-04-18: 500 mg via ORAL
  Filled 2018-04-18: qty 1

## 2018-04-18 NOTE — H&P (Signed)
History and Physical    Nathan [name] MHD:[id number] DOB: 07-03-1960 DOA: 04/17/2018  PCP: Javier Docker, MD  Patient coming from: Group home.  Chief Complaint: Lethargy.  HPI: Nathan [name] is a 58 y.o. male with history of bipolar disorder, schizophrenia, HIV, chronic kidney disease, mental retardation who was admitted in November 2019 for bowel obstruction and also had mental status changes due to metabolic encephalopathy per the reports was agitated for the last 2 days and was in a manic spell became increasingly drowsy over the last 24 hours.  As per the report patient was not given any sedative but was taking his usual home medications.  ED Course: In the ER patient is drowsy responses in a.m. moves all extremities follows commands.  Chest x-ray was unremarkable patient is afebrile.  UA is pending along with urine drug screen.  Ammonia levels are normal valproic acid was within normal limits.  Given the mental status changes patient is being admitted for further management.  Creatinine is mildly elevated from baseline.  CT head is pending.  Review of Systems: As per HPI, rest all negative.   Past Medical History:  Diagnosis Date  . Anemia   . Anemia   . Anxiety   . Bipolar 1 disorder ([place])   . Depression, major   . HIV (human immunodeficiency virus infection) ([place])   . Hyperglycemia 08/06/2017  . Hypernatremia   . Kidney failure   . Lithium toxicity   . MR (mental retardation)   . Schizoaffective disorder ([place]) 08/06/2017  . Schizophrenia ([place])   . Seizures ([place])     Past Surgical History:  Procedure Laterality Date  . BIOPSY N/A 04/12/2014   Procedure: BIOPSY;  Surgeon: Danie Binder, MD;  Location: [facility];  Service: Endoscopy;  Laterality: N/A;  Gastric  . COLONOSCOPY WITH PROPOFOL N/A 04/12/2014   SLF:  1. one colon polyp removed 2. The left colon is extremely redundant 3. Small internal hemorrhoids  . ESOPHAGOGASTRODUODENOSCOPY (EGD) WITH PROPOFOL N/A  04/12/2014   SLF: Stricture at the gastroesophageal junction 2. small polyp  in teh gastric body 3. Mild Non-erosive gastritis.   Marland Kitchen NOSE SURGERY  1968   unsure what type of surgery. something to help him breathe better  . POLYPECTOMY N/A 04/12/2014   Procedure: POLYPECTOMY;  Surgeon: Danie Binder, MD;  Location: [facility];  Service: Endoscopy;  Laterality: N/A;  Cecal  . SAVORY DILATION N/A 04/12/2014   Procedure: SAVORY DILATION;  Surgeon: Danie Binder, MD;  Location: [facility];  Service: Endoscopy;  Laterality: N/A;  12.8/ 14/15/16  . SKIN BIOPSY       reports that he has never smoked. He has never used smokeless tobacco. He reports that he does not drink alcohol or use drugs.  No Known Allergies  Family History  Problem Relation Age of Onset  . Other Mother        unknown medical history  . Other Father        unknown medical history  . Colon cancer Neg Hx     Prior to Admission medications   Medication Sig Start Date End Date Taking? Authorizing Provider  aspirin 81 MG chewable tablet Chew 81 mg by mouth daily.   Yes [provider]  atorvastatin (LIPITOR) 20 MG tablet Take 20 mg by mouth daily at 6 PM.   Yes [provider]  benztropine (COGENTIN) 2 MG tablet Take 2 mg by mouth 2 (two) times daily.  Yes [provider]  bictegravir-emtricitabine-tenofovir AF (BIKTARVY) 50-200-25 MG TABS tablet Take 1 tablet by mouth daily. 08/06/17  Yes Tommy Medal, Lavell Islam, MD  Cholecalciferol (VITAMIN D3 PO) Take 1 tablet by mouth daily.   Yes [provider]  clozapine (FAZACLO) 100 MG disintegrating tablet Take 50-100 mg by mouth See admin instructions. 100mg  in the morning and 50mg  at bedtime   Yes [provider]  folic acid (FOLVITE) 1 MG tablet Take 1 mg by mouth daily.   Yes [provider]  gabapentin (NEURONTIN) 300 MG capsule Take 300 mg by mouth every morning. Take with the 400mg  tablet to equal a total of 700mg  in the mornings.   Yes  [provider]  gabapentin (NEURONTIN) 400 MG capsule Take 400 mg by mouth 2 (two) times daily.    Yes [provider]  haloperidol (HALDOL) 5 MG tablet Take 2 tablets (10 mg total) by mouth daily with supper. Patient taking differently: Take 5 mg by mouth daily with supper.  07/21/16  Yes Rogue Bussing, MD  haloperidol decanoate (HALDOL DECANOATE) 50 MG/ML injection Inject 50 mg into the muscle every 28 (twenty-eight) days.   Yes [provider]  levETIRAcetam (KEPPRA) 500 MG tablet Take 1 tablet (500 mg total) by mouth 2 (two) times daily. 02/09/18  Yes Marcial Pacas, MD  metFORMIN (GLUCOPHAGE-XR) 500 MG 24 hr tablet Take 500 mg by mouth 2 (two) times daily.   Yes [provider]  nystatin (MYCOSTATIN/NYSTOP) powder Apply topically 4 (four) times daily.   Yes [provider]  polyethylene glycol (MIRALAX / GLYCOLAX) packet Take 17 g by mouth daily. 02/24/18  Yes Thurnell Lose, MD  ranitidine (ZANTAC) 150 MG capsule Take 150 mg by mouth 2 (two) times daily.   Yes [provider]  senna (SENOKOT) 8.6 MG tablet Take 2 tablets by mouth 2 (two) times daily.   Yes [provider]  terbinafine (LAMISIL) 250 MG tablet Take 250 mg by mouth daily.   Yes [provider]  valproic acid (DEPAKENE) 250 MG capsule Take 2 capsules every morning then take 4 capsules at bedtime Patient taking differently: Take 500-1,000 mg by mouth See admin instructions. Take 2 capsules (500mg ) every morning then take 4 capsules (1000mg ) at bedtime 02/09/18  Yes Marcial Pacas, MD  cetirizine (ZYRTEC) 10 MG tablet TAKE 1 TABLET ONCE DAILY. Patient not taking: No sig reported 01/21/18   Delia Chimes A, MD  ranitidine (ZANTAC) 150 MG tablet TAKE 1 TABLET BY MOUTH 12 HOURS AFTER TAKING REYATAZ. Patient not taking: No sig reported 10/31/12   Tommy Medal, Lavell Islam, MD    Physical Exam: Vitals:   04/17/18 2030 04/17/18 2100 04/17/18 2130 04/17/18 2244    BP: 110/73 111/72 108/66 108/70  Pulse: 71 68 72 71  Resp:  11 13 18   Temp:      SpO2: 98% 98% 97% 98%      Constitutional: Moderately built and nourished. Vitals:   04/17/18 2030 04/17/18 2100 04/17/18 2130 04/17/18 2244  BP: 110/73 111/72 108/66 108/70  Pulse: 71 68 72 71  Resp:  11 13 18   Temp:      SpO2: 98% 98% 97% 98%   Eyes: Anicteric no pallor. ENMT: No discharge from the ears eyes nose or mouth. Neck: No mass.  No neck rigidity. Respiratory: No rhonchi or crepitations. Cardiovascular: S1-S2 heard. Abdomen: Soft nontender bowel sounds present. Musculoskeletal: No edema.  No joint effusion. Skin: No rash. Neurologic: Patient  is lethargic but oriented to his name follows commands moves all extremities pupils are equal and reacting to light. Psychiatric: Lethargic.   Labs on Admission: I have personally reviewed following labs and imaging studies  CBC: Recent Labs  Lab 04/17/18 2110  WBC 7.4  NEUTROABS 4.1  HGB 12.0*  HCT 39.3  MCV 93.1  PLT 295*   Basic Metabolic Panel: Recent Labs  Lab 04/17/18 2110  NA 139  K 3.6  CL 104  CO2 27  GLUCOSE 113*  BUN 19  CREATININE 1.90*  CALCIUM 9.1   GFR: CrCl cannot be calculated (Unknown ideal weight.). Liver Function Tests: Recent Labs  Lab 04/17/18 2110  AST 15  ALT 11  ALKPHOS 46  BILITOT 0.5  PROT 7.3  ALBUMIN 3.6   No results for input(s): LIPASE, AMYLASE in the last 168 hours. Recent Labs  Lab 04/17/18 2111  AMMONIA 20   Coagulation Profile: No results for input(s): INR, PROTIME in the last 168 hours. Cardiac Enzymes: No results for input(s): CKTOTAL, CKMB, CKMBINDEX, TROPONINI in the last 168 hours. BNP (last 3 results) No results for input(s): PROBNP in the last 8760 hours. HbA1C: No results for input(s): HGBA1C in the last 72 hours. CBG: Recent Labs  Lab 04/18/18 0110  GLUCAP 97   Lipid Profile: No results for input(s): CHOL, HDL, LDLCALC, TRIG, CHOLHDL, LDLDIRECT in the  last 72 hours. Thyroid Function Tests: No results for input(s): TSH, T4TOTAL, FREET4, T3FREE, THYROIDAB in the last 72 hours. Anemia Panel: No results for input(s): VITAMINB12, FOLATE, FERRITIN, TIBC, IRON, RETICCTPCT in the last 72 hours. Urine analysis:    Component Value Date/Time   COLORURINE AMBER (A) 02/16/2018 0237   APPEARANCEUR CLEAR 02/16/2018 0237   LABSPEC 1.017 02/16/2018 0237   PHURINE 5.0 02/16/2018 0237   GLUCOSEU NEGATIVE 02/16/2018 0237   HGBUR NEGATIVE 02/16/2018 0237   BILIRUBINUR NEGATIVE 02/16/2018 0237   KETONESUR 5 (A) 02/16/2018 0237   PROTEINUR NEGATIVE 02/16/2018 0237   UROBILINOGEN 0.2 06/20/2014 1525   NITRITE NEGATIVE 02/16/2018 0237   LEUKOCYTESUR NEGATIVE 02/16/2018 0237   Sepsis Labs: @LABRCNTIP (procalcitonin:4,lacticidven:4) )No results found for this or any previous visit (from the past 240 hour(s)).   Radiological Exams on Admission: Dg Chest Portable 1 View  Result Date: 04/17/2018 CLINICAL DATA:  Mental status changes. EXAM: PORTABLE CHEST 1 VIEW COMPARISON:  02/19/2018 FINDINGS: 2107 hours. Low volumes. Cardiopericardial silhouette is at upper limits of normal for size. Bibasilar opacity, left greater than right compatible with atelectasis or infiltrate. The visualized bony structures of the thorax are intact. Telemetry leads overlie the chest. IMPRESSION: Bibasilar atelectasis or infiltrate, left greater than right. Electronically Signed   By: Misty Stanley M.D.   On: 04/17/2018 21:28    EKG: Independently reviewed.  Normal sinus rhythm with nonspecific T changes in inferior leads.  Assessment/Plan Principal Problem:   Acute encephalopathy Active Problems:   HIV disease (H[facility])   Bipolar 1 disorder, mixed, moderate (H[facility])   Intellectual disability   CKD (chronic kidney disease) stage 3, GFR 30-59 ml/min ([facility])   Schizophrenia ([facility])   Seizures ([facility])    1. Acute encephalopathy -cause not clear.  UA is pending.  Urine drug screen is also  pending.  I have ordered CT head.  Gently hydrate recheck metabolic panel.  Because of encephalopathy is not clear. 2. History of HIV on antiretroviral last CD4 count in October 2019 was around 750.  Continue antiretroviral.  Patient is afebrile. 3. Schizophrenia and bipolar disorder will continue  home medications. 4. History of seizures on Keppra which I have dosed as IV for now.  I do not think patient is having any seizure at this time since patient is following commands. 5. Chronic kidney disease stage III with creatinine mildly elevated.  If there is any further decreasing GFR may have to change the doses of his Keppra. 6. Anemia appears to be chronic follow CBC. 7. Thrombocytopenia appears to be new.  Follow CBC.  Patient is afebrile.  Further decline in platelets may have to consider further work-up. 8. Diabetes mellitus type 2 we will keep patient on sliding scale coverage.   DVT prophylaxis: SCDs. Code Status: Full code. Family Communication: No family at the bedside. Disposition Plan: Home. Consults called: None. Admission status: Observation.   Rise Patience MD [facility] Pager [phone].  If 7PM-7AM, please contact night-coverage [email or web address] Password [email or web address]  04/18/2018, 1:42 AM

## 2018-04-18 NOTE — ED Notes (Signed)
If pt gets discharged or admitted, call caregiver, Vaughan Basta at [phone], she will be the person to pick him up

## 2018-04-18 NOTE — Consult Note (Signed)
[facility] Consult   Reason for Consult:  ''suspecting manic episode'' Referring Physician:  Dr. Tamala Julian Patient Identification: Nathan [name] MRN:  [id number] Principal Diagnosis: Acute encephalopathy Diagnosis:  Principal Problem:   Acute encephalopathy Active Problems:   HIV disease ([place])   Bipolar 1 disorder, mixed, moderate (HCC)   Intellectual disability   CKD (chronic kidney disease) stage 3, GFR 30-59 ml/min ([place])   Schizophrenia ([place])   Seizures ([place])   Total Time spent with patient: 45 minutes  Subjective:   Nathan [name] is a 58 y.o. male patient admitted with lethargy.  HPI:  Patient  with history of bipolar disorder, schizophrenia, HIV, chronic kidney disease, mental retardation who was admitted due to metabolic encephalopathy. Staff nurse reports that patient was agitated on admission but has since calm down a lot. Patient is a poor historian but he  reports that he lives in a group home/assisted living environment but unable to recall the reason for his current admission. He is cooperative, alert, awake and oriented x3. He endorses hearing voices telling him good stuffs but denies paranoid, suicidal or homicidal ideations, intent or plan. There is no evidence of agitation, mood swings or manic symptoms today, he appears stable mentally on current medications.  Past Psychiatric History: as above  Risk to Self:   Risk to Others:   Prior Inpatient Therapy:   Prior Outpatient Therapy:    Past Medical History:  Past Medical History:  Diagnosis Date  . Anemia   . Anemia   . Anxiety   . Bipolar 1 disorder ([place])   . Depression, major   . HIV (human immunodeficiency virus infection) ([place])   . Hyperglycemia 08/06/2017  . Hypernatremia   . Kidney failure   . Lithium toxicity   . MR (mental retardation)   . Schizoaffective disorder ([place]) 08/06/2017  . Schizophrenia ([place])   . Seizures ([place])     Past Surgical History:  Procedure Laterality Date   . BIOPSY N/A 04/12/2014   Procedure: BIOPSY;  Surgeon: Danie Binder, MD;  Location: [facility];  Service: Endoscopy;  Laterality: N/A;  Gastric  . COLONOSCOPY WITH PROPOFOL N/A 04/12/2014   SLF:  1. one colon polyp removed 2. The left colon is extremely redundant 3. Small internal hemorrhoids  . ESOPHAGOGASTRODUODENOSCOPY (EGD) WITH PROPOFOL N/A 04/12/2014   SLF: Stricture at the gastroesophageal junction 2. small polyp  in teh gastric body 3. Mild Non-erosive gastritis.   Marland Kitchen NOSE SURGERY  1968   unsure what type of surgery. something to help him breathe better  . POLYPECTOMY N/A 04/12/2014   Procedure: POLYPECTOMY;  Surgeon: Danie Binder, MD;  Location: [facility];  Service: Endoscopy;  Laterality: N/A;  Cecal  . SAVORY DILATION N/A 04/12/2014   Procedure: SAVORY DILATION;  Surgeon: Danie Binder, MD;  Location: [facility];  Service: Endoscopy;  Laterality: N/A;  12.8/ 14/15/16  . SKIN BIOPSY     Family History:  Family History  Problem Relation Age of Onset  . Other Mother        unknown medical history  . Other Father        unknown medical history  . Colon cancer Neg Hx    Family Psychiatric  History:  Social History:  Social History   Substance and Sexual Activity  Alcohol Use No  . Alcohol/week: 0.0 standard drinks     Social History   Substance and Sexual Activity  Drug Use No    Social History  Socioeconomic History  . Marital status: Single    Spouse name: Not on file  . Number of children: 0  . Years of education: 9th grade  . Highest education level: Not on file  Occupational History  . Occupation: Disabled  Social Needs  . Financial resource strain: Not on file  . Food insecurity:    Worry: Not on file    Inability: Not on file  . Transportation needs:    Medical: Not on file    Non-medical: Not on file  Tobacco Use  . Smoking status: Never Smoker  . Smokeless tobacco: Never Used  Substance and Sexual Activity  . Alcohol use: No    Alcohol/week: 0.0 standard  drinks  . Drug use: No  . Sexual activity: Never    Birth control/protection: Abstinence    Comment: [place]  Lifestyle  . Physical activity:    Days per week: Not on file    Minutes per session: Not on file  . Stress: Not on file  Relationships  . Social connections:    Talks on phone: Not on file    Gets together: Not on file    Attends religious service: Not on file    Active member of club or organization: Not on file    Attends meetings of clubs or organizations: Not on file    Relationship status: Not on file  Other Topics Concern  . Not on file  Social History Narrative   Lives in group home.   Right-handed.   2-3 sodas per day.   Additional Social History:    Allergies:  No Known Allergies  Labs:  Results for orders placed or performed during the hospital encounter of 04/17/18 (from the past 48 hour(s))  CBC with Differential     Status: Abnormal   Collection Time: 04/17/18  9:10 PM  Result Value Ref Range   WBC 7.4 4.0 - 10.5 K/uL   RBC 4.22 4.22 - 5.81 MIL/uL   Hemoglobin 12.0 (L) 13.0 - 17.0 g/dL   HCT 39.3 39.0 - 52.0 %   MCV 93.1 80.0 - 100.0 fL   MCH 28.4 26.0 - 34.0 pg   MCHC 30.5 30.0 - 36.0 g/dL   RDW 13.8 11.5 - 15.5 %   Platelets 138 (L) 150 - 400 K/uL   nRBC 0.0 0.0 - 0.2 %   Neutrophils Relative % 56 %   Neutro Abs 4.1 1.7 - 7.7 K/uL   Lymphocytes Relative 30 %   Lymphs Abs 2.2 0.7 - 4.0 K/uL   Monocytes Relative 10 %   Monocytes Absolute 0.7 0.1 - 1.0 K/uL   Eosinophils Relative 4 %   Eosinophils Absolute 0.3 0.0 - 0.5 K/uL   Basophils Relative 0 %   Basophils Absolute 0.0 0.0 - 0.1 K/uL   Immature Granulocytes 0 %   Abs Immature Granulocytes 0.02 0.00 - 0.07 K/uL    Comment: Performed at [facility], [address] [place], [place] [address]  Comprehensive metabolic panel     Status: Abnormal   Collection Time: 04/17/18  9:10 PM  Result Value Ref Range   Sodium 139 135 - 145 mmol/L   Potassium 3.6 3.5 - 5.1 mmol/L    Chloride 104 98 - 111 mmol/L   CO2 27 22 - 32 mmol/L   Glucose, Bld 113 (H) 70 - 99 mg/dL   BUN 19 6 - 20 mg/dL   Creatinine, Ser 1.90 (H) 0.61 - 1.24 mg/dL  Calcium 9.1 8.9 - 10.3 mg/dL   Total Protein 7.3 6.5 - 8.1 g/dL   Albumin 3.6 3.5 - 5.0 g/dL   AST 15 15 - 41 U/L   ALT 11 0 - 44 U/L   Alkaline Phosphatase 46 38 - 126 U/L   Total Bilirubin 0.5 0.3 - 1.2 mg/dL   GFR calc non Af Amer 38 (L) >60 mL/min   GFR calc Af Amer 44 (L) >60 mL/min   Anion gap 8 5 - 15    Comment: Performed at [facility] [address] [place], [place] [address]  Valproic acid level     Status: None   Collection Time: 04/17/18  9:10 PM  Result Value Ref Range   Valproic Acid Lvl 55 50.0 - 100.0 ug/mL    Comment: Performed at [facility] [address], [place], [place] [address]  Ethanol     Status: None   Collection Time: 04/17/18  9:11 PM  Result Value Ref Range   Alcohol, Ethyl (B) <10 <10 mg/dL    Comment: (NOTE) Lowest detectable limit for serum alcohol is 10 mg/dL. For medical purposes only. Performed at [facility], [place] [address] [place], [place] [address]   Ammonia     Status: None   Collection Time: 04/17/18  9:11 PM  Result Value Ref Range   Ammonia 20 9 - 35 umol/L    Comment: Performed at [facility], [place] [address], [place], [place] [address]  CBG monitoring, ED     Status: None   Collection Time: 04/18/18  1:10 AM  Result Value Ref Range   Glucose-Capillary 97 70 - 99 mg/dL  MRSA PCR Screening     Status: None   Collection Time: 04/18/18  3:30 AM  Result Value Ref Range   MRSA by PCR NEGATIVE NEGATIVE    Comment:        The GeneXpert MRSA Assay (FDA approved for NASAL specimens only), is one component of a comprehensive MRSA colonization surveillance program. It is not intended to diagnose MRSA infection nor to guide or monitor treatment for MRSA infections. Performed at [facility], [place] [address] [place], [place] [address]    Basic metabolic panel     Status: Abnormal   Collection Time: 04/18/18  4:49 AM  Result Value Ref Range   Sodium 142 135 - 145 mmol/L   Potassium 3.3 (L) 3.5 - 5.1 mmol/L   Chloride 103 98 - 111 mmol/L   CO2 27 22 - 32 mmol/L   Glucose, Bld 155 (H) 70 - 99 mg/dL   BUN 19 6 - 20 mg/dL   Creatinine, Ser 1.66 (H) 0.61 - 1.24 mg/dL   Calcium 9.2 8.9 - 10.3 mg/dL   GFR calc non Af Amer 44 (L) >60 mL/min   GFR calc Af Amer 52 (L) >60 mL/min   Anion gap 12 5 - 15    Comment: Performed at [facility] [address], [place] [address]  CBC     Status: Abnormal   Collection Time: 04/18/18  4:49 AM  Result Value Ref Range   WBC 6.3 4.0 - 10.5 K/uL   RBC 4.22 4.22 - 5.81 MIL/uL   Hemoglobin 12.1 (L) 13.0 - 17.0 g/dL   HCT 38.9 (L) 39.0 - 52.0 %   MCV 92.2 80.0 - 100.0 fL   MCH 28.7 26.0 - 34.0 pg   MCHC 31.1 30.0 - 36.0  g/dL   RDW 13.9 11.5 - 15.5 %   Platelets 133 (L) 150 - 400 K/uL   nRBC 0.0 0.0 - 0.2 %    Comment: Performed at [facility], [place] [address], [place], [place] [address]  Troponin I - Once     Status: None   Collection Time: 04/18/18  4:49 AM  Result Value Ref Range   Troponin I <0.03 <0.03 ng/mL    Comment: Performed at [facility] [address] [place], [place] [address]  Glucose, capillary     Status: None   Collection Time: 04/18/18  6:30 AM  Result Value Ref Range   Glucose-Capillary 88 70 - 99 mg/dL   Comment 1 Notify RN    Comment 2 Document in Chart   Urine rapid drug screen (hosp performed)     Status: None   Collection Time: 04/18/18  9:01 AM  Result Value Ref Range   Opiates NONE DETECTED NONE DETECTED   Cocaine NONE DETECTED NONE DETECTED   Benzodiazepines NONE DETECTED NONE DETECTED   Amphetamines NONE DETECTED NONE DETECTED   Tetrahydrocannabinol NONE DETECTED NONE DETECTED   Barbiturates NONE DETECTED NONE DETECTED    Comment: (NOTE) DRUG SCREEN FOR MEDICAL PURPOSES ONLY.  IF CONFIRMATION IS NEEDED FOR ANY  PURPOSE, NOTIFY LAB WITHIN 5 DAYS. LOWEST DETECTABLE LIMITS FOR URINE DRUG SCREEN Drug Class                     Cutoff (ng/mL) Amphetamine and metabolites    1000 Barbiturate and metabolites    200 Benzodiazepine                 756 Tricyclics and metabolites     300 Opiates and metabolites        300 Cocaine and metabolites        300 THC                            50 Performed at [facility], [place] [address] [place], [place] [address]   Urinalysis, Routine w reflex microscopic     Status: Abnormal   Collection Time: 04/18/18  9:02 AM  Result Value Ref Range   Color, Urine YELLOW YELLOW   APPearance CLEAR CLEAR   Specific Gravity, Urine 1.015 1.005 - 1.030   pH 5.0 5.0 - 8.0   Glucose, UA 50 (A) NEGATIVE mg/dL   Hgb urine dipstick NEGATIVE NEGATIVE   Bilirubin Urine NEGATIVE NEGATIVE   Ketones, ur NEGATIVE NEGATIVE mg/dL   Protein, ur NEGATIVE NEGATIVE mg/dL   Nitrite NEGATIVE NEGATIVE   Leukocytes, UA NEGATIVE NEGATIVE    Comment: Performed at [facility] [address] [place], [place] [address]    Current Facility-Administered Medications  Medication Dose Route Frequency Provider Last Rate Last Dose  . acetaminophen (TYLENOL) tablet 650 mg  650 mg Oral Q6H PRN Rise Patience, MD       Or  . acetaminophen (TYLENOL) suppository 650 mg  650 mg Rectal Q6H PRN Rise Patience, MD      . aspirin chewable tablet 81 mg  81 mg Oral Daily Rise Patience, MD   81 mg at 04/18/18 1660  . atorvastatin (LIPITOR) tablet 20 mg  20 mg Oral q1800 Rise Patience, MD      . benztropine (COGENTIN) tablet 2 mg  2 mg Oral BID Rise Patience, MD  2 mg at 04/18/18 0928  . bictegravir-emtricitabine-tenofovir AF (BIKTARVY) 50-200-25 MG per tablet 1 tablet  1 tablet Oral Daily Rise Patience, MD   1 tablet at 04/18/18 7989  . cloZAPine (CLOZARIL) tablet 100 mg  100 mg Oral Daily Rise Patience, MD   100 mg at 04/18/18 2119  . cloZAPine  (CLOZARIL) tablet 50 mg  50 mg Oral QHS Rise Patience, MD      . famotidine (PEPCID) tablet 20 mg  20 mg Oral BID Rise Patience, MD   20 mg at 04/18/18 4174  . folic acid (FOLVITE) tablet 1 mg  1 mg Oral Daily Rise Patience, MD   1 mg at 04/18/18 0814  . gabapentin (NEURONTIN) capsule 400 mg  400 mg Oral QHS Rise Patience, MD   400 mg at 04/18/18 0352  . gabapentin (NEURONTIN) capsule 700 mg  700 mg Oral Daily Rise Patience, MD   700 mg at 04/18/18 4818  . haloperidol (HALDOL) tablet 5 mg  5 mg Oral Q supper Rise Patience, MD      . Derrill Memo ON 04/19/2018] Influenza vac split quadrivalent PF (FLUARIX) injection 0.5 mL  0.5 mL Intramuscular Tomorrow-1000 Rise Patience, MD      . insulin aspart (novoLOG) injection 0-9 Units  0-9 Units Subcutaneous TID WC Rise Patience, MD      . levETIRAcetam (KEPPRA) IVPB 500 mg/100 mL premix  500 mg Intravenous Q12H Rise Patience, MD 400 mL/hr at 04/18/18 0930 500 mg at 04/18/18 0930  . ondansetron (ZOFRAN) tablet 4 mg  4 mg Oral Q6H PRN Rise Patience, MD       Or  . ondansetron [facility]) injection 4 mg  4 mg Intravenous Q6H PRN Rise Patience, MD      . polyethylene glycol (MIRALAX / GLYCOLAX) packet 17 g  17 g Oral Daily Rise Patience, MD   17 g at 04/18/18 5631  . senna (SENOKOT) tablet 17.2 mg  2 tablet Oral BID Rise Patience, MD   17.2 mg at 04/18/18 0926  . terbinafine (LAMISIL) tablet 250 mg  250 mg Oral Daily Rise Patience, MD   250 mg at 04/18/18 4970  . valproic acid (DEPAKENE) 250 MG capsule 1,000 mg  1,000 mg Oral QHS Rise Patience, MD      . valproic acid (DEPAKENE) 250 MG capsule 500 mg  500 mg Oral Daily Rise Patience, MD   500 mg at 04/18/18 0930    Musculoskeletal: Strength & Muscle Tone: within normal limits Gait & Station: not tested Patient leans: N/A  Psychiatric Specialty Exam: Physical Exam  Psychiatric: He has a normal mood and  affect. Judgment and thought content normal. His speech is delayed. He is actively hallucinating. Cognition and memory are normal.    Review of Systems  Constitutional: Negative.   HENT: Negative.   Eyes: Negative.   Respiratory: Negative.   Cardiovascular: Negative.   Gastrointestinal: Negative.   Skin: Negative.   Psychiatric/Behavioral: Negative.     Blood pressure 127/69, pulse 85, temperature 97.6 F (36.4 C), temperature source Oral, resp. rate 16, SpO2 97 %.There is no height or weight on file to calculate BMI.  General Appearance: Casual  Eye Contact:  Good  Speech:  Clear and Coherent and Slow  Volume:  Decreased  Mood:  Euthymic  Affect:  Appropriate  Thought Process:  Coherent  Orientation:  Full (Time, Place, and Person)  Thought  Content:  Hallucinations: Auditory  Suicidal Thoughts:  No  Homicidal Thoughts:  No  Memory:  Immediate;   Fair Recent;   Fair Remote;   Poor  Judgement:  Intact  Insight:  Fair  Psychomotor Activity:  Psychomotor Retardation  Concentration:  Concentration: Fair and Attention Span: Fair  Recall:  Poor  Fund of Knowledge:  Fair  Language:  Fair  Akathisia:  No  Handed:  Right  AIMS (if indicated):     Assets:  Communication Skills Desire for Improvement Housing  ADL's:  maginal  Cognition:  WNL  Sleep:   fair     Treatment Plan Summary: 58 y.o. male with history of bipolar disorder, schizophrenia, HIV, chronic kidney disease, mental retardation who was admitted due to metabolic encephalopathy and agitation. Today, patient is cooperative, pleasant, alert and awake. There is no evidence of agitation or mania today. He appears stable on his current psychotropics.  Recommendations: -Continue current psychotropics-see [facility] -Psychiatric service signing out-no evidence of manic episode today.  Disposition: No evidence of imminent risk to self or others at present.   Re-consult psych service as needed  Corena Pilgrim,  MD 04/18/2018 10:57 AM

## 2018-04-18 NOTE — ED Notes (Signed)
Report called to rn on 3w  Pt in c-t  Will take upstairs when he returns

## 2018-04-18 NOTE — Progress Notes (Signed)
CSW received a phone call from the MD. CSW has not been following the patient. Patient is from a group home. After reviewing the notes, it is not stated anywhere what group home the patient is from. CSW called the legal guardian in the chart and left a voicemail. CSW also attempted to leave a voicemail on the mobile number and was unable to do so because the voicemail box was full.   CSW googled the home number for the patient in his chart and it was listed for his group home. Address matched as well. Patient is from [facility].   CSW will contact the group home to see if they are able to accept him back.   CSW will continue to follow.   Domenic Schwab, MSW, [place]

## 2018-04-18 NOTE — Progress Notes (Signed)
CSW spoke with Ms. Dellie Catholic at the patient's group home. She is able to take him back and stated she will be coming up to the hospital. She had concerns and wanted to speak with the doctor.   CSW reported that she will have the doctor give her a call or meet her at bedside.   Ms. Elly Modena also provided a number to patient's APS worker who is on call for this weekend.   CSW called and left a message for the on-call APS worker.   Domenic Schwab, MSW, [place]

## 2018-04-18 NOTE — ED Notes (Signed)
Admitting doctor has seen 

## 2018-04-18 NOTE — ED Notes (Signed)
Sleeps unless  awakened

## 2018-04-19 DIAGNOSIS — F3162 Bipolar disorder, current episode mixed, moderate: Secondary | ICD-10-CM | POA: Diagnosis not present

## 2018-04-19 DIAGNOSIS — B20 Human immunodeficiency virus [HIV] disease: Secondary | ICD-10-CM | POA: Diagnosis not present

## 2018-04-19 DIAGNOSIS — N183 Chronic kidney disease, stage 3 (moderate): Secondary | ICD-10-CM | POA: Diagnosis not present

## 2018-04-19 DIAGNOSIS — R569 Unspecified convulsions: Secondary | ICD-10-CM

## 2018-04-19 DIAGNOSIS — G9340 Encephalopathy, unspecified: Secondary | ICD-10-CM | POA: Diagnosis not present

## 2018-04-19 LAB — CBC WITH DIFFERENTIAL/PLATELET
Abs Immature Granulocytes: 0.02 10*3/uL (ref 0.00–0.07)
Basophils Absolute: 0 10*3/uL (ref 0.0–0.1)
Basophils Relative: 0 %
EOS PCT: 4 %
Eosinophils Absolute: 0.3 10*3/uL (ref 0.0–0.5)
HCT: 36.7 % — ABNORMAL LOW (ref 39.0–52.0)
Hemoglobin: 11.7 g/dL — ABNORMAL LOW (ref 13.0–17.0)
Immature Granulocytes: 0 %
Lymphocytes Relative: 35 %
Lymphs Abs: 2.3 10*3/uL (ref 0.7–4.0)
MCH: 29.6 pg (ref 26.0–34.0)
MCHC: 31.9 g/dL (ref 30.0–36.0)
MCV: 92.9 fL (ref 80.0–100.0)
MONO ABS: 0.7 10*3/uL (ref 0.1–1.0)
Monocytes Relative: 10 %
Neutro Abs: 3.4 10*3/uL (ref 1.7–7.7)
Neutrophils Relative %: 51 %
Platelets: 141 10*3/uL — ABNORMAL LOW (ref 150–400)
RBC: 3.95 MIL/uL — ABNORMAL LOW (ref 4.22–5.81)
RDW: 13.8 % (ref 11.5–15.5)
WBC: 6.7 10*3/uL (ref 4.0–10.5)
nRBC: 0 % (ref 0.0–0.2)

## 2018-04-19 LAB — BASIC METABOLIC PANEL
Anion gap: 7 (ref 5–15)
BUN: 20 mg/dL (ref 6–20)
CHLORIDE: 105 mmol/L (ref 98–111)
CO2: 27 mmol/L (ref 22–32)
Calcium: 8.9 mg/dL (ref 8.9–10.3)
Creatinine, Ser: 1.73 mg/dL — ABNORMAL HIGH (ref 0.61–1.24)
GFR calc Af Amer: 49 mL/min — ABNORMAL LOW (ref >60)
GFR calc non Af Amer: 42 mL/min — ABNORMAL LOW (ref >60)
Glucose, Bld: 112 mg/dL — ABNORMAL HIGH (ref 70–99)
Potassium: 4 mmol/L (ref 3.5–5.1)
Sodium: 139 mmol/L (ref 135–145)

## 2018-04-19 LAB — GLUCOSE, CAPILLARY
Glucose-Capillary: 106 mg/dL — ABNORMAL HIGH (ref 70–99)
Glucose-Capillary: 177 mg/dL — ABNORMAL HIGH (ref 70–99)

## 2018-04-19 MED ORDER — POTASSIUM CHLORIDE CRYS ER 20 MEQ PO TBCR
40.0000 meq | EXTENDED_RELEASE_TABLET | ORAL | Status: AC
  Administered 2018-04-19: 40 meq via ORAL
  Filled 2018-04-19: qty 2

## 2018-04-19 MED ORDER — LEVETIRACETAM 500 MG PO TABS
500.0000 mg | ORAL_TABLET | Freq: Two times a day (BID) | ORAL | Status: DC
  Administered 2018-04-19: 500 mg via ORAL
  Filled 2018-04-19: qty 1

## 2018-04-19 NOTE — Progress Notes (Deleted)
Nathan [name], is a 58 y.o. male  DOB October 01, 1960  MRN [id number].  Admission date:  04/17/2018  Admitting Physician  Rise Patience, MD  Discharge Date:  04/19/2018   Primary MD  Pavelock, Ralene Bathe, MD  Recommendations for primary care physician for things to follow:    Admission Diagnosis  Encephalopathy [G93.40] Acute encephalopathy [G93.40]   Discharge Diagnosis  Encephalopathy [G93.40] Acute encephalopathy [G93.40]    Principal Problem:   Acute encephalopathy Active Problems:   HIV disease ([place])   Bipolar 1 disorder, mixed, moderate (HCC)   Intellectual disability   CKD (chronic kidney disease) stage 3, GFR 30-59 ml/min (HCC)   Schizophrenia ([place])   Seizures ([place])      Past Medical History:  Diagnosis Date  . Anemia   . Anemia   . Anxiety   . Bipolar 1 disorder ([place])   . Depression, major   . HIV (human immunodeficiency virus infection) ([place])   . Hyperglycemia 08/06/2017  . Hypernatremia   . Kidney failure   . Lithium toxicity   . MR (mental retardation)   . Schizoaffective disorder ([place]) 08/06/2017  . Schizophrenia ([place])   . Seizures ([place])     Past Surgical History:  Procedure Laterality Date  . BIOPSY N/A 04/12/2014   Procedure: BIOPSY;  Surgeon: Danie Binder, MD;  Location: [facility];  Service: Endoscopy;  Laterality: N/A;  Gastric  . COLONOSCOPY WITH PROPOFOL N/A 04/12/2014   SLF:  1. one colon polyp removed 2. The left colon is extremely redundant 3. Small internal hemorrhoids  . ESOPHAGOGASTRODUODENOSCOPY (EGD) WITH PROPOFOL N/A 04/12/2014   SLF: Stricture at the gastroesophageal junction 2. small polyp  in teh gastric body 3. Mild Non-erosive gastritis.   [facility] Kitchen NOSE SURGERY  1968   unsure what type of surgery. something to help him breathe better  . POLYPECTOMY N/A 04/12/2014   Procedure:  POLYPECTOMY;  Surgeon: Danie Binder, MD;  Location: [facility];  Service: Endoscopy;  Laterality: N/A;  Cecal  . SAVORY DILATION N/A 04/12/2014   Procedure: SAVORY DILATION;  Surgeon: Danie Binder, MD;  Location: [facility];  Service: Endoscopy;  Laterality: N/A;  12.8/ 14/15/16  . SKIN BIOPSY         HPI  from the history and physical done on the day of admission:    Nathan [name] is a 58 y.o. male with history of bipolar disorder, schizophrenia, HIV, chronic kidney disease, mental retardation who was admitted in November 2019 for bowel obstruction and also had mental status changes due to metabolic encephalopathy per the reports was agitated for the last 2 days and was in a manic spell became increasingly drowsy over the last 24 hours.  As per the report patient was not given any sedative but was taking his usual home medications.  ED Course: In the ER patient is drowsy responses in a.m. moves all extremities follows commands.  Chest x-ray was unremarkable patient is afebrile.  UA is pending along with urine drug screen.  Ammonia levels are normal valproic acid was within normal limits.  Given the mental status changes patient is being admitted for further management.  Creatinine is mildly elevated from baseline.  CT head was initially pending.    Hospital Course:   Acute metabolic encephalopathy: Patient was noted to be more lethargic after a 2-day manic-like state.  CT scan of the brain showed no acute abnormalities.  Urinalysis showed no acute abnormalities and urine drug screen was negative. Question the possibility of recent seizure and post ictal state versus medication. Psychiatry was consulted to see if any adjustments needed to be made, but they recommended continuation with current regimen.  Cause for acute lethargy included possible seizure.  Follow-up with outpatient psychiatry regarding medications.  Hypokalemia: Potassium on admission 3.3.  Patient was given 40 mEq of potassium  chloride.  Follow-up potassium levels in outpatient setting.  HIV last CD4 count 01/2018 was around 715.  Patient was continued on antiretroviral therapy.  Schizophrenia and bipolar disorder: Continued home medications as prescribed.  Anemia of chronic disease: Hemoglobin 12.1 which appears stable.  Thrombocytopenia: Chronic.  Platelet count 133 no reports of bleeding during hospitalization.  Chronic kidney disease stage III: Patient's baseline kidney function appears to range from 1.6-1.8, but patient initially presented with creatinine of 1.9.  After gentle IV fluid hydration creatinine improved to 1.66, but was noted to be 1.73 on day of discharge.  Seizure disorder: No seizures were observed during hospitalization.  Continued Keppra.  Intellectual delay: Stable  Follow UP     Consults obtained: Psychiatry  Discharge Condition: Stable  Diet and Activity recommendation: See Discharge Instructions below  Discharge Instructions    Follow-up with psychiatry and primary care provider within 1-2 weeks     Discharge Medications     Allergies as of 04/19/2018   No Known Allergies     Medication List    TAKE these medications   aspirin 81 MG chewable tablet Chew 81 mg by mouth daily.   atorvastatin 20 MG tablet Commonly known as:  LIPITOR Take 20 mg by mouth daily at 6 PM.   benztropine 2 MG tablet Commonly known as:  COGENTIN Take 2 mg by mouth 2 (two) times daily.   bictegravir-emtricitabine-tenofovir AF 50-200-25 MG Tabs tablet Commonly known as:  BIKTARVY Take 1 tablet by mouth daily.   cetirizine 10 MG tablet Commonly known as:  ZYRTEC TAKE 1 TABLET ONCE DAILY.   clozapine 100 MG disintegrating tablet Commonly known as:  FAZACLO Take 50-100 mg by mouth See admin instructions. 100mg  in the morning and 50mg  at bedtime   folic acid 1 MG tablet Commonly known as:  FOLVITE Take 1 mg by mouth daily.   gabapentin 300 MG capsule Commonly known as:   NEURONTIN Take 300 mg by mouth every morning. Take with the 400mg  tablet to equal a total of 700mg  in the mornings.   gabapentin 400 MG capsule Commonly known as:  NEURONTIN Take 400 mg by mouth 2 (two) times daily.   haloperidol 5 MG tablet Commonly known as:  HALDOL Take 2 tablets (10 mg total) by mouth daily with supper. What changed:  how much to take   haloperidol decanoate 50 MG/ML injection Commonly known as:  HALDOL DECANOATE Inject 50 mg into the muscle every 28 (twenty-eight) days.   levETIRAcetam 500 MG tablet Commonly known as:  KEPPRA Take 1 tablet (500 mg total) by mouth 2 (two) times daily.   metFORMIN 500 MG 24 hr tablet Commonly known as:  GLUCOPHAGE-XR Take 500 mg by mouth 2 (two) times daily.   nystatin powder Commonly known as:  MYCOSTATIN/NYSTOP Apply topically 4 (four) times daily.   polyethylene glycol packet Commonly known as:  MIRALAX / GLYCOLAX Take 17 g by mouth daily.   ranitidine 150 MG capsule Commonly known as:  ZANTAC Take 150 mg by mouth 2 (two) times daily. What changed:  Another medication with the same name was removed. Continue taking this medication, and follow the directions you see here.   senna 8.6 MG tablet Commonly known as:  SENOKOT Take 2 tablets by mouth 2 (two) times daily.   terbinafine 250 MG tablet Commonly known as:  LAMISIL Take 250 mg by mouth daily.   valproic acid 250 MG capsule Commonly known as:  DEPAKENE Take 2 capsules every morning then take 4 capsules at bedtime What changed:    how much to take  how to take this  when to take this  additional instructions   VITAMIN D3 PO Take 1 tablet by mouth daily.       Major procedures and Radiology Reports - PLEASE review detailed and final reports for all details, in brief -     Ct Head Wo Contrast  Result Date: 04/18/2018 CLINICAL DATA:  Manic episodes for the past 2 days. EXAM: CT HEAD WITHOUT CONTRAST TECHNIQUE: Contiguous axial images were  obtained from the base of the skull through the vertex without intravenous contrast. COMPARISON:  02/16/2018 FINDINGS: Brain: No evidence of acute infarction, hemorrhage, hydrocephalus, extra-axial collection or mass lesion/mass effect. Diffuse cerebral atrophy. Vascular: Mild intracranial arterial vascular calcifications. Skull: Calvarium appears intact. Sinuses/Orbits: Small mucosal retention cysts in the maxillary antra. Chronic opacification of the left frontal sinus. Paranasal sinuses and mastoid air cells are otherwise clear. Other: None. IMPRESSION: No acute intracranial abnormalities. Chronic atrophy. Electronically Signed   By: Lucienne Capers M.D.   On: 04/18/2018 02:13   Dg Chest Portable 1 View  Result Date: 04/17/2018 CLINICAL DATA:  Mental status changes. EXAM: PORTABLE CHEST 1 VIEW COMPARISON:  02/19/2018 FINDINGS: 2107 hours. Low volumes. Cardiopericardial silhouette is at upper limits of normal for size. Bibasilar opacity, left greater than right compatible with atelectasis or infiltrate. The visualized bony structures of the thorax are intact. Telemetry leads overlie the chest. IMPRESSION: Bibasilar atelectasis or infiltrate, left greater than right. Electronically Signed   By: Misty Stanley M.D.   On: 04/17/2018 21:28    Micro Results   Recent Results (from the past 240 hour(s))  MRSA PCR Screening     Status: None   Collection Time: 04/18/18  3:30 AM  Result Value Ref Range Status   MRSA by PCR NEGATIVE NEGATIVE Final    Comment:        The GeneXpert MRSA Assay (FDA approved for NASAL specimens only), is one component of a comprehensive MRSA colonization surveillance program. It is not intended to diagnose MRSA infection nor to guide or monitor treatment for MRSA infections. Performed at [facility], [place] [address], [place], [place] [address]        Today   Subjective    Nathan [name] today has no complaints.   Objective   Blood pressure 117/69,  pulse 69, temperature 97.7 F (36.5 C), temperature source Axillary, resp. rate 18, SpO2 93 %.   Intake/Output Summary (Last 24 hours) at 04/19/2018 0443 Last data filed at 04/18/2018 1802 Gross per 24 hour  Intake 580 ml  Output -  Net 580 ml  Exam  Constitutional: NAD, calm, comfortable Eyes: PERRL, lids and conjunctivae normal ENMT: Mucous membranes are moist. Posterior pharynx clear of any exudate or lesions.  Poor dentition.  Neck: normal, supple, no masses, no thyromegaly Respiratory: clear to auscultation bilaterally, no wheezing, no crackles. Normal respiratory effort. No accessory muscle use.  Cardiovascular: Regular rate and rhythm, no murmurs / rubs / gallops. No extremity edema. 2+ pedal pulses. No carotid bruits.  Abdomen: no tenderness, no masses palpated. No hepatosplenomegaly. Bowel sounds positive.  Musculoskeletal: no clubbing / cyanosis. No joint deformity upper and lower extremities. Good ROM, no contractures. Normal muscle tone.  Skin: no rashes, lesions, ulcers. No induration Neurologic: CN 2-12 grossly intact. Sensation intact, DTR normal. Strength 5/5 in all 4.  Psychiatric: Normal mood.  Patient alert to person and place.   Data Review   CBC w Diff:  Lab Results  Component Value Date   WBC 6.3 04/18/2018   HGB 12.1 (L) 04/18/2018   HCT 38.9 (L) 04/18/2018   PLT 133 (L) 04/18/2018   LYMPHOPCT 30 04/17/2018   BANDSPCT 0 02/22/2018   MONOPCT 10 04/17/2018   EOSPCT 4 04/17/2018   BASOPCT 0 04/17/2018    CMP:  Lab Results  Component Value Date   NA 142 04/18/2018   K 3.3 (L) 04/18/2018   CL 103 04/18/2018   CO2 27 04/18/2018   BUN 19 04/18/2018   CREATININE 1.66 (H) 04/18/2018   CREATININE 1.91 (H) 07/21/2017   PROT 7.3 04/17/2018   ALBUMIN 3.6 04/17/2018   BILITOT 0.5 04/17/2018   ALKPHOS 46 04/17/2018   AST 15 04/17/2018   ALT 11 04/17/2018  .   Total Time in preparing paper work, data evaluation and todays exam - 35  minutes  Norval Morton M.D on 04/19/2018 at 4:43 AM  [facility]   Office  [phone]

## 2018-04-19 NOTE — Progress Notes (Signed)
Patient admitted after midnight.  Agree with overall plan of care as seen by the H&P.  Have ordered a psychiatry consult for evaluation of medications.  Will replace potassium as seen to be 3.3.  Patient currently appears to be in no acute distress and able to answer questions appropriately.  Will follow-up psychiatry consult and determine if patient is able to be discharged back to group facility.

## 2018-04-21 NOTE — Discharge Summary (Signed)
Nathan [name], is a 58 y.o. male  DOB 1961-02-10  MRN [id number].  Admission date:  04/17/2018  Admitting Physician  Rise Patience, MD  Discharge Date:  04/21/2018   Primary MD  Pavelock, Ralene Bathe, MD  Recommendations for primary care physician for things to follow:    Admission Diagnosis  Encephalopathy [G93.40] Acute encephalopathy [G93.40]   Discharge Diagnosis  Encephalopathy [G93.40] Acute encephalopathy [G93.40]    Principal Problem:   Acute encephalopathy Active Problems:   HIV disease ([place])   Bipolar 1 disorder, mixed, moderate (HCC)   Intellectual disability   CKD (chronic kidney disease) stage 3, GFR 30-59 ml/min (HCC)   Schizophrenia ([place])   Seizures ([place])      Past Medical History:  Diagnosis Date  . Anemia   . Anemia   . Anxiety   . Bipolar 1 disorder ([place])   . Depression, major   . HIV (human immunodeficiency virus infection) ([place])   . Hyperglycemia 08/06/2017  . Hypernatremia   . Kidney failure   . Lithium toxicity   . MR (mental retardation)   . Schizoaffective disorder ([place]) 08/06/2017  . Schizophrenia ([place])   . Seizures ([place])     Past Surgical History:  Procedure Laterality Date  . BIOPSY N/A 04/12/2014   Procedure: BIOPSY;  Surgeon: Danie Binder, MD;  Location: [facility];  Service: Endoscopy;  Laterality: N/A;  Gastric  . COLONOSCOPY WITH PROPOFOL N/A 04/12/2014   SLF:  1. one colon polyp removed 2. The left colon is extremely redundant 3. Small internal hemorrhoids  . ESOPHAGOGASTRODUODENOSCOPY (EGD) WITH PROPOFOL N/A 04/12/2014   SLF: Stricture at the gastroesophageal junction 2. small polyp  in teh gastric body 3. Mild Non-erosive gastritis.   Marland Kitchen NOSE SURGERY  1968   unsure what type of surgery. something to help him breathe better  . POLYPECTOMY N/A 04/12/2014   Procedure:  POLYPECTOMY;  Surgeon: Danie Binder, MD;  Location: [facility];  Service: Endoscopy;  Laterality: N/A;  Cecal  . SAVORY DILATION N/A 04/12/2014   Procedure: SAVORY DILATION;  Surgeon: Danie Binder, MD;  Location: [facility];  Service: Endoscopy;  Laterality: N/A;  12.8/ 14/15/16  . SKIN BIOPSY         HPI  from the history and physical done on the day of admission:    Nathan [name] is a 58 y.o. male with history of bipolar disorder, schizophrenia, HIV, chronic kidney disease, mental retardation who was admitted in November 2019 for bowel obstruction and also had mental status changes due to metabolic encephalopathy per the reports was agitated for the last 2 days and was in a manic spell became increasingly drowsy over the last 24 hours.  As per the report patient was not given any sedative but was taking his usual home medications.  ED Course: In the ER patient is drowsy responses in a.m. moves all extremities follows commands.  Chest x-ray was unremarkable patient is afebrile.  UA is pending along with urine drug screen.  Ammonia levels are normal valproic acid was within normal limits.  Given the mental status changes patient is being admitted for further management.  Creatinine is mildly elevated from baseline.  CT head was initially pending.    Hospital Course:   Acute metabolic encephalopathy: Patient was noted to be more lethargic after a 2-day manic-like state.  CT scan of the brain showed no acute abnormalities.  Urinalysis showed no acute abnormalities and urine drug screen was negative. Question the possibility of recent seizure and post ictal state versus medication. Psychiatry was consulted to see if any adjustments needed to be made, but they recommended continuation with current regimen.  Cause for acute lethargy included possible seizure.  Follow-up with outpatient psychiatry regarding medications.  Hypokalemia: Potassium on admission 3.3.  Patient was given 40 mEq of potassium  chloride.  Follow-up potassium levels in outpatient setting.  HIV last CD4 count 01/2018 was around 715.  Patient was continued on antiretroviral therapy.  Schizophrenia and bipolar disorder: Continued home medications as prescribed.  Anemia of chronic disease: Hemoglobin 12.1 which appears stable.  Thrombocytopenia: Chronic.  Platelet count 133 no reports of bleeding during hospitalization.  Chronic kidney disease stage III: Patient's baseline kidney function appears to range from 1.6-1.8, but patient initially presented with creatinine of 1.9.  After gentle IV fluid hydration creatinine improved to 1.66, but was noted to be 1.73 on day of discharge.  Seizure disorder: No seizures were observed during hospitalization.  Continued Keppra.  Intellectual delay: Stable  Follow UP  Follow-up Information    Pavelock, Ralene Bathe, MD Follow up in 1 week(s).   Specialty:  Internal Medicine Why:  Please follow-up with primary care provider regarding recent discharge from hospital to have repeat CBC and BMP Contact information: [address] [place] [address] [phone]            Consults obtained: Psychiatry  Discharge Condition: Stable  Diet and Activity recommendation: See Discharge Instructions below  Discharge Instructions    Follow-up with psychiatry and primary care provider within 1-2 weeks Discharge Instructions    Diet - low sodium heart healthy   Complete by:  As directed    Discharge instructions   Complete by:  As directed    Please follow-up with your psychiatrist regarding current medications to see if any adjustments that need to be made.  The work-up has shown that there are no significant signs of infection.  Symptoms may have been possibly caused by a seizure follow-up with your neurologist to let him that you are in the hospital to see if any changes that may need to be made to your medications.   Increase activity slowly   Complete by:  As  directed    Walk with assistance   Complete by:  As directed         Discharge Medications     Allergies as of 04/19/2018   No Known Allergies     Medication List    TAKE these medications   aspirin 81 MG chewable tablet Chew 81 mg by mouth daily.   atorvastatin 20 MG tablet Commonly known as:  LIPITOR Take 20 mg by mouth daily at 6 PM.   benztropine 2 MG tablet Commonly known as:  COGENTIN Take 2 mg by mouth 2 (two) times daily.   bictegravir-emtricitabine-tenofovir AF 50-200-25 MG Tabs tablet Commonly known as:  BIKTARVY Take 1 tablet by mouth daily.   cetirizine 10 MG tablet Commonly known as:  ZYRTEC  TAKE 1 TABLET ONCE DAILY.   clozapine 100 MG disintegrating tablet Commonly known as:  FAZACLO Take 50-100 mg by mouth See admin instructions. 100mg  in the morning and 50mg  at bedtime   folic acid 1 MG tablet Commonly known as:  FOLVITE Take 1 mg by mouth daily.   gabapentin 300 MG capsule Commonly known as:  NEURONTIN Take 300 mg by mouth every morning. Take with the 400mg  tablet to equal a total of 700mg  in the mornings.   gabapentin 400 MG capsule Commonly known as:  NEURONTIN Take 400 mg by mouth 2 (two) times daily.   haloperidol 5 MG tablet Commonly known as:  HALDOL Take 2 tablets (10 mg total) by mouth daily with supper. What changed:  how much to take   haloperidol decanoate 50 MG/ML injection Commonly known as:  HALDOL DECANOATE Inject 50 mg into the muscle every 28 (twenty-eight) days.   levETIRAcetam 500 MG tablet Commonly known as:  KEPPRA Take 1 tablet (500 mg total) by mouth 2 (two) times daily.   metFORMIN 500 MG 24 hr tablet Commonly known as:  GLUCOPHAGE-XR Take 500 mg by mouth 2 (two) times daily.   nystatin powder Commonly known as:  MYCOSTATIN/NYSTOP Apply topically 4 (four) times daily.   polyethylene glycol packet Commonly known as:  MIRALAX / GLYCOLAX Take 17 g by mouth daily.   ranitidine 150 MG capsule Commonly  known as:  ZANTAC Take 150 mg by mouth 2 (two) times daily. What changed:  Another medication with the same name was removed. Continue taking this medication, and follow the directions you see here.   senna 8.6 MG tablet Commonly known as:  SENOKOT Take 2 tablets by mouth 2 (two) times daily.   terbinafine 250 MG tablet Commonly known as:  LAMISIL Take 250 mg by mouth daily.   valproic acid 250 MG capsule Commonly known as:  DEPAKENE Take 2 capsules every morning then take 4 capsules at bedtime What changed:    how much to take  how to take this  when to take this  additional instructions   VITAMIN D3 PO Take 1 tablet by mouth daily.       Major procedures and Radiology Reports - PLEASE review detailed and final reports for all details, in brief -     Ct Head Wo Contrast  Result Date: 04/18/2018 CLINICAL DATA:  Manic episodes for the past 2 days. EXAM: CT HEAD WITHOUT CONTRAST TECHNIQUE: Contiguous axial images were obtained from the base of the skull through the vertex without intravenous contrast. COMPARISON:  02/16/2018 FINDINGS: Brain: No evidence of acute infarction, hemorrhage, hydrocephalus, extra-axial collection or mass lesion/mass effect. Diffuse cerebral atrophy. Vascular: Mild intracranial arterial vascular calcifications. Skull: Calvarium appears intact. Sinuses/Orbits: Small mucosal retention cysts in the maxillary antra. Chronic opacification of the left frontal sinus. Paranasal sinuses and mastoid air cells are otherwise clear. Other: None. IMPRESSION: No acute intracranial abnormalities. Chronic atrophy. Electronically Signed   By: Lucienne Capers M.D.   On: 04/18/2018 02:13   Dg Chest Portable 1 View  Result Date: 04/17/2018 CLINICAL DATA:  Mental status changes. EXAM: PORTABLE CHEST 1 VIEW COMPARISON:  02/19/2018 FINDINGS: 2107 hours. Low volumes. Cardiopericardial silhouette is at upper limits of normal for size. Bibasilar opacity, left greater than right  compatible with atelectasis or infiltrate. The visualized bony structures of the thorax are intact. Telemetry leads overlie the chest. IMPRESSION: Bibasilar atelectasis or infiltrate, left greater than right. Electronically Signed   By: Misty Stanley  M.D.   On: 04/17/2018 21:28    Micro Results   Recent Results (from the past 240 hour(s))  MRSA PCR Screening     Status: None   Collection Time: 04/18/18  3:30 AM  Result Value Ref Range Status   MRSA by PCR NEGATIVE NEGATIVE Final    Comment:        The GeneXpert MRSA Assay (FDA approved for NASAL specimens only), is one component of a comprehensive MRSA colonization surveillance program. It is not intended to diagnose MRSA infection nor to guide or monitor treatment for MRSA infections. Performed at [facility], [place] [address], [place], [place] [address]        Today   Subjective    Jaxx Huish today has no complaints.   Objective   Blood pressure 123/74, pulse 64, temperature 98 F (36.7 C), temperature source Axillary, resp. rate 15, SpO2 94 %.  No intake or output data in the 24 hours ending 04/21/18 0933  Exam  Constitutional: NAD, calm, comfortable Eyes: PERRL, lids and conjunctivae normal ENMT: Mucous membranes are moist. Posterior pharynx clear of any exudate or lesions.  Poor dentition.  Neck: normal, supple, no masses, no thyromegaly Respiratory: clear to auscultation bilaterally, no wheezing, no crackles. Normal respiratory effort. No accessory muscle use.  Cardiovascular: Regular rate and rhythm, no murmurs / rubs / gallops. No extremity edema. 2+ pedal pulses. No carotid bruits.  Abdomen: no tenderness, no masses palpated. No hepatosplenomegaly. Bowel sounds positive.  Musculoskeletal: no clubbing / cyanosis. No joint deformity upper and lower extremities. Good ROM, no contractures. Normal muscle tone.  Skin: no rashes, lesions, ulcers. No induration Neurologic: CN 2-12 grossly intact.  Sensation intact, DTR normal. Strength 5/5 in all 4.  Psychiatric: Normal mood.  Patient alert to person and place.   Data Review   CBC w Diff:  Lab Results  Component Value Date   WBC 6.7 04/19/2018   HGB 11.7 (L) 04/19/2018   HCT 36.7 (L) 04/19/2018   PLT 141 (L) 04/19/2018   LYMPHOPCT 35 04/19/2018   BANDSPCT 0 02/22/2018   MONOPCT 10 04/19/2018   EOSPCT 4 04/19/2018   BASOPCT 0 04/19/2018    CMP:  Lab Results  Component Value Date   NA 139 04/19/2018   K 4.0 04/19/2018   CL 105 04/19/2018   CO2 27 04/19/2018   BUN 20 04/19/2018   CREATININE 1.73 (H) 04/19/2018   CREATININE 1.91 (H) 07/21/2017   PROT 7.3 04/17/2018   ALBUMIN 3.6 04/17/2018   BILITOT 0.5 04/17/2018   ALKPHOS 46 04/17/2018   AST 15 04/17/2018   ALT 11 04/17/2018  .   Total Time in preparing paper work, data evaluation and todays exam - 35 minutes  Norval Morton M.D on 04/21/2018 at 9:33 AM  [facility]   Office  [phone]

## 2018-04-27 ENCOUNTER — Ambulatory Visit: Payer: Medicare Other | Admitting: Family

## 2018-05-02 ENCOUNTER — Encounter (HOSPITAL_COMMUNITY): Payer: Self-pay | Admitting: Emergency Medicine

## 2018-05-02 ENCOUNTER — Emergency Department (HOSPITAL_COMMUNITY)
Admission: EM | Admit: 2018-05-02 | Discharge: 2018-05-03 | Disposition: A | Discharge status: Home / Self Care | Payer: Medicare Other | Attending: Emergency Medicine | Admitting: Emergency Medicine

## 2018-05-02 ENCOUNTER — Emergency Department (HOSPITAL_COMMUNITY): Payer: Medicare Other

## 2018-05-02 DIAGNOSIS — R471 Dysarthria and anarthria: Secondary | ICD-10-CM | POA: Diagnosis not present

## 2018-05-02 DIAGNOSIS — Z7982 Long term (current) use of aspirin: Secondary | ICD-10-CM | POA: Insufficient documentation

## 2018-05-02 DIAGNOSIS — B20 Human immunodeficiency virus [HIV] disease: Secondary | ICD-10-CM | POA: Insufficient documentation

## 2018-05-02 DIAGNOSIS — N183 Chronic kidney disease, stage 3 (moderate): Secondary | ICD-10-CM | POA: Diagnosis not present

## 2018-05-02 DIAGNOSIS — Z79899 Other long term (current) drug therapy: Secondary | ICD-10-CM | POA: Insufficient documentation

## 2018-05-02 DIAGNOSIS — R4182 Altered mental status, unspecified: Secondary | ICD-10-CM | POA: Diagnosis present

## 2018-05-02 DIAGNOSIS — Z7984 Long term (current) use of oral hypoglycemic drugs: Secondary | ICD-10-CM | POA: Diagnosis not present

## 2018-05-02 LAB — CBC WITH DIFFERENTIAL/PLATELET
Abs Immature Granulocytes: 0.01 10*3/uL (ref 0.00–0.07)
Basophils Absolute: 0 10*3/uL (ref 0.0–0.1)
Basophils Relative: 0 %
Eosinophils Absolute: 0.2 10*3/uL (ref 0.0–0.5)
Eosinophils Relative: 4 %
HCT: 37.3 % — ABNORMAL LOW (ref 39.0–52.0)
HEMOGLOBIN: 11.6 g/dL — AB (ref 13.0–17.0)
Immature Granulocytes: 0 %
LYMPHS PCT: 29 %
Lymphs Abs: 1.9 10*3/uL (ref 0.7–4.0)
MCH: 29.3 pg (ref 26.0–34.0)
MCHC: 31.1 g/dL (ref 30.0–36.0)
MCV: 94.2 fL (ref 80.0–100.0)
Monocytes Absolute: 0.7 10*3/uL (ref 0.1–1.0)
Monocytes Relative: 10 %
Neutro Abs: 3.7 10*3/uL (ref 1.7–7.7)
Neutrophils Relative %: 57 %
Platelets: 129 10*3/uL — ABNORMAL LOW (ref 150–400)
RBC: 3.96 MIL/uL — ABNORMAL LOW (ref 4.22–5.81)
RDW: 13.3 % (ref 11.5–15.5)
WBC: 6.5 10*3/uL (ref 4.0–10.5)
nRBC: 0 % (ref 0.0–0.2)

## 2018-05-02 LAB — POCT I-STAT EG7
Acid-Base Excess: 4 mmol/L — ABNORMAL HIGH (ref 0.0–2.0)
Bicarbonate: 27.9 mmol/L (ref 20.0–28.0)
Calcium, Ion: 1.05 mmol/L — ABNORMAL LOW (ref 1.15–1.40)
HCT: 36 % — ABNORMAL LOW (ref 39.0–52.0)
Hemoglobin: 12.2 g/dL — ABNORMAL LOW (ref 13.0–17.0)
O2 Saturation: 96 %
Potassium: 3.9 mmol/L (ref 3.5–5.1)
Sodium: 140 mmol/L (ref 135–145)
TCO2: 29 mmol/L (ref 22–32)
pCO2, Ven: 39.1 mmHg — ABNORMAL LOW (ref 44.0–60.0)
pH, Ven: 7.461 — ABNORMAL HIGH (ref 7.250–7.430)
pO2, Ven: 77 mmHg — ABNORMAL HIGH (ref 32.0–45.0)

## 2018-05-02 LAB — COMPREHENSIVE METABOLIC PANEL
ALBUMIN: 3.6 g/dL (ref 3.5–5.0)
ALT: 16 U/L (ref 0–44)
AST: 17 U/L (ref 15–41)
Alkaline Phosphatase: 41 U/L (ref 38–126)
Anion gap: 11 (ref 5–15)
BUN: 15 mg/dL (ref 6–20)
CO2: 25 mmol/L (ref 22–32)
Calcium: 8.7 mg/dL — ABNORMAL LOW (ref 8.9–10.3)
Chloride: 103 mmol/L (ref 98–111)
Creatinine, Ser: 1.73 mg/dL — ABNORMAL HIGH (ref 0.61–1.24)
GFR calc Af Amer: 49 mL/min — ABNORMAL LOW (ref >60)
GFR calc non Af Amer: 42 mL/min — ABNORMAL LOW (ref >60)
Glucose, Bld: 134 mg/dL — ABNORMAL HIGH (ref 70–99)
Potassium: 3.8 mmol/L (ref 3.5–5.1)
Sodium: 139 mmol/L (ref 135–145)
Total Bilirubin: 0.3 mg/dL (ref 0.3–1.2)
Total Protein: 7.3 g/dL (ref 6.5–8.1)

## 2018-05-02 LAB — ACETAMINOPHEN LEVEL: Acetaminophen (Tylenol), Serum: <10 ug/mL — ABNORMAL LOW (ref 10–30)

## 2018-05-02 LAB — CBG MONITORING, ED
Glucose-Capillary: 144 mg/dL — ABNORMAL HIGH (ref 70–99)
Glucose-Capillary: 95 mg/dL (ref 70–99)

## 2018-05-02 LAB — AMMONIA: Ammonia: 47 umol/L — ABNORMAL HIGH (ref 9–35)

## 2018-05-02 LAB — ETHANOL

## 2018-05-02 LAB — VALPROIC ACID LEVEL: Valproic Acid Lvl: 60 ug/mL (ref 50.0–100.0)

## 2018-05-02 LAB — SALICYLATE LEVEL

## 2018-05-02 NOTE — ED Provider Notes (Signed)
[facility] EMERGENCY DEPARTMENT Provider Note   CSN: [id number] Arrival date & time: 05/02/18  1801     History   Chief Complaint Chief Complaint  Patient presents with  . Weakness  . Altered Mental Status    HPI Nathan [name] is a 58 y.o. male.  HPI   Nathan [name] is a 58 y.o. male with PMH of Anemia, bipolar disorder, HIV, depression, hyperglycemia, hyponatremia, kidney failure, lithium toxicity, mental retardation, schizoaffective disorder and schizophrenia with history of seizure disorder who presents with a care provider from group home at the bedside.  She reports that since he was discharged around January 11 the patient has had persistent changes in his mental status.  She reports that he has been requiring more assistance with walking and appears to lean from side to side.  Slowly falls down often while standing or attempting to walk.  He does not appear to change his mental status during those times and remains alert.  When riding in the car sitting down he often leans to the side, more often the left than the right.  His last seizure was about 6 weeks ago.  Compliant with all medications since discharge.  She reports that in the past 2 days he has had worsening dysarthria and expressive aphasia.  It has been constant and not waxing and waning.  He has been able to follow commands but appears to have difficulty speaking and normally at baseline he is able to communicate and speaks clearly although at a low tone.  He has not been violent or agitated.  She does not believe he has had any recent falls or trauma.  Past Medical History:  Diagnosis Date  . Anemia   . Anemia   . Anxiety   . Bipolar 1 disorder ([place])   . Depression, major   . HIV (human immunodeficiency virus infection) ([place])   . Hyperglycemia 08/06/2017  . Hypernatremia   . Kidney failure   . Lithium toxicity   . MR (mental retardation)   . Schizoaffective disorder ([place]) 08/06/2017  .  Schizophrenia ([place])   . Seizures [facility])     Patient Active Problem List   Diagnosis Date Noted  . Sepsis due to pneumonia ([place]) 02/16/2018  . Diarrhea 02/16/2018  . Gait abnormality 02/09/2018  . Hyperglycemia 08/06/2017  . Schizoaffective disorder ([place]) 08/06/2017  . Bipolar 1 disorder ([place])   . Schizophrenia ([place])   . Seizures ([place])   . Altered mental state 07/20/2016  . Aggressive behavior   . GERD (gastroesophageal reflux disease) 07/25/2014  . Acute encephalopathy 06/23/2014  . Hypernatremia 06/20/2014  . Dehydration 06/18/2014  . Lithium toxicity 06/18/2014  . ARF (acute renal failure) ([place]) 06/18/2014  . Acute urinary retention 06/18/2014  . CAP (community acquired pneumonia) 06/18/2014  . Encounter for screening colonoscopy 03/25/2014  . Dysphagia, pharyngoesophageal phase 03/25/2014  . CKD (chronic kidney disease) stage 3, GFR 30-59 ml/min (HCC) 07/22/2012  . HIV disease ([place]) 06/19/2012  . Bipolar 1 disorder, mixed, moderate ([place]) 06/19/2012  . Hyponatremia 06/19/2012  . Intellectual disability 06/19/2012    Past Surgical History:  Procedure Laterality Date  . BIOPSY N/A 04/12/2014   Procedure: BIOPSY;  Surgeon: Danie Binder, MD;  Location: [facility];  Service: Endoscopy;  Laterality: N/A;  Gastric  . COLONOSCOPY WITH PROPOFOL N/A 04/12/2014   SLF:  1. one colon polyp removed 2. The left colon is extremely redundant 3. Small internal hemorrhoids  . ESOPHAGOGASTRODUODENOSCOPY (EGD) WITH  PROPOFOL N/A 04/12/2014   SLF: Stricture at the gastroesophageal junction 2. small polyp  in teh gastric body 3. Mild Non-erosive gastritis.   Mar[facility] Kitchen NOSE SURGERY  1968   unsure what type of surgery. something to help him breathe better  . POLYPECTOMY N/A 04/12/2014   Procedure: POLYPECTOMY;  Surgeon: Danie Binder, MD;  Location: [facility];  Service: Endoscopy;  Laterality: N/A;  Cecal  . SAVORY DILATION N/A 04/12/2014   Procedure: SAVORY DILATION;  Surgeon: Danie Binder, MD;  Location: [facility];   Service: Endoscopy;  Laterality: N/A;  12.8/ 14/15/16  . SKIN BIOPSY          Home Medications    Prior to Admission medications   Medication Sig Start Date End Date Taking? Authorizing Provider  aspirin 81 MG chewable tablet Chew 81 mg by mouth daily.    [provider]  atorvastatin (LIPITOR) 20 MG tablet Take 20 mg by mouth daily at 6 PM.    [provider]  benztropine (COGENTIN) 2 MG tablet Take 2 mg by mouth 2 (two) times daily.     [provider]  bictegravir-emtricitabine-tenofovir AF (BIKTARVY) 50-200-25 MG TABS tablet Take 1 tablet by mouth daily. 08/06/17   Truman Hayward, MD  cetirizine (ZYRTEC) 10 MG tablet TAKE 1 TABLET ONCE DAILY. Patient not taking: No sig reported 01/21/18   Forrest Moron, MD  Cholecalciferol (VITAMIN D3 PO) Take 1 tablet by mouth daily.    [provider]  clozapine (FAZACLO) 100 MG disintegrating tablet Take 50-100 mg by mouth See admin instructions. 100mg  in the morning and 50mg  at bedtime    [provider]  folic acid (FOLVITE) 1 MG tablet Take 1 mg by mouth daily.    [provider]  gabapentin (NEURONTIN) 300 MG capsule Take 300 mg by mouth every morning. Take with the 400mg  tablet to equal a total of 700mg  in the mornings.    [provider]  gabapentin (NEURONTIN) 400 MG capsule Take 400 mg by mouth 2 (two) times daily.     [provider]  haloperidol (HALDOL) 5 MG tablet Take 2 tablets (10 mg total) by mouth daily with supper. Patient taking differently: Take 5 mg by mouth daily with supper.  07/21/16   Rogue Bussing, MD  haloperidol decanoate (HALDOL DECANOATE) 50 MG/ML injection Inject 50 mg into the muscle every 28 (twenty-eight) days.    [provider]  levETIRAcetam (KEPPRA) 500 MG tablet Take 1 tablet (500 mg total) by mouth 2 (two) times daily. 02/09/18   Marcial Pacas, MD  metFORMIN (GLUCOPHAGE-XR) 500 MG 24 hr tablet Take 500 mg by mouth 2  (two) times daily.    [provider]  nystatin (MYCOSTATIN/NYSTOP) powder Apply topically 4 (four) times daily.    [provider]  polyethylene glycol (MIRALAX / GLYCOLAX) packet Take 17 g by mouth daily. 02/24/18   Thurnell Lose, MD  ranitidine (ZANTAC) 150 MG capsule Take 150 mg by mouth 2 (two) times daily.    [provider]  senna (SENOKOT) 8.6 MG tablet Take 2 tablets by mouth 2 (two) times daily.    [provider]  terbinafine (LAMISIL) 250 MG tablet Take 250 mg by mouth daily.    [provider]  valproic acid (DEPAKENE) 250 MG capsule Take 2 capsules every morning then take 4 capsules at bedtime Patient taking differently: Take 500-1,000 mg by mouth See admin instructions. Take 2 capsules (500mg ) every morning  then take 4 capsules (1000mg ) at bedtime 02/09/18   Marcial Pacas, MD    Family History Family History  Problem Relation Age of Onset  . Other Mother        unknown medical history  . Other Father        unknown medical history  . Colon cancer Neg Hx     Social History Social History   Tobacco Use  . Smoking status: Never Smoker  . Smokeless tobacco: Never Used  Substance Use Topics  . Alcohol use: No    Alcohol/week: 0.0 standard drinks  . Drug use: No     Allergies   Patient has no known allergies.   Review of Systems Review of Systems  Unable to perform ROS: Mental status change (Majority of history and review of systems provided by caregiver.  Some information from patient.)  Constitutional: Negative for activity change, appetite change, diaphoresis and fever.  HENT: Negative for congestion and rhinorrhea.   Eyes: Negative for redness and visual disturbance.  Respiratory: Negative for shortness of breath.   Cardiovascular: Negative for chest pain.  Gastrointestinal: Positive for constipation. Negative for abdominal pain, nausea and vomiting.  Endocrine: Negative for polyuria.  Genitourinary: Negative for  dysuria.  Musculoskeletal: Negative for arthralgias.  Skin: Negative for rash and wound.  Neurological: Positive for speech difficulty and weakness. Negative for seizures, syncope and light-headedness.  Psychiatric/Behavioral: Negative for agitation, behavioral problems, self-injury, sleep disturbance and suicidal ideas.     Physical Exam Updated Vital Signs BP 139/77   Pulse 89   Temp 98.1 F (36.7 C) (Oral)   Resp 18   SpO2 95%   Physical Exam Vitals signs and nursing note reviewed.  Constitutional:      Appearance: He is well-developed. He is not ill-appearing.  HENT:     Head: Normocephalic and atraumatic.  Eyes:     Conjunctiva/sclera: Conjunctivae normal.  Neck:     Musculoskeletal: Neck supple.  Cardiovascular:     Rate and Rhythm: Normal rate and regular rhythm.     Heart sounds: S1 normal and S2 normal. No murmur.  Pulmonary:     Effort: Pulmonary effort is normal. No respiratory distress.     Breath sounds: Normal breath sounds.  Abdominal:     Palpations: Abdomen is soft.     Tenderness: There is no abdominal tenderness.  Skin:    General: Skin is warm and dry.  Neurological:     General: No focal deficit present.     Mental Status: He is alert.     GCS: GCS eye subscore is 4. GCS verbal subscore is 4. GCS motor subscore is 6.     Cranial Nerves: Dysarthria present. No facial asymmetry.     Sensory: No sensory deficit.     Motor: Tremor (intention tremor BL UE, equal.) present. No weakness, abnormal muscle tone, seizure activity or pronator drift.     Coordination: Coordination is intact. Romberg sign negative. Finger-Nose-Finger Test normal. Rapid alternating movements normal.     Comments: Patient walks with somewhat unsteady gait, shuffle slightly, but is able to ambulate without ataxia and without significant assistance.  No pronator drift.  No asterixis.  Of note, patient had difficulty complying with his exam but after coaching he was able to follow  commands.  Exhibits dysarthria and what appears to be possibly expressive aphasia.  Psychiatric:        Behavior: Behavior is cooperative.     ED Treatments / Results  Labs (  all labs ordered are listed, but only abnormal results are displayed) Labs Reviewed  CBC WITH DIFFERENTIAL/PLATELET - Abnormal; Notable for the following components:      Result Value   RBC 3.96 (*)    Hemoglobin 11.6 (*)    HCT 37.3 (*)    Platelets 129 (*)    All other components within normal limits  COMPREHENSIVE METABOLIC PANEL - Abnormal; Notable for the following components:   Glucose, Bld 134 (*)    Creatinine, Ser 1.73 (*)    Calcium 8.7 (*)    GFR calc non Af Amer 42 (*)    GFR calc Af Amer 49 (*)    All other components within normal limits  AMMONIA - Abnormal; Notable for the following components:   Ammonia 47 (*)    All other components within normal limits  ACETAMINOPHEN LEVEL - Abnormal; Notable for the following components:   Acetaminophen (Tylenol), Serum <10 (*)    All other components within normal limits  CBG MONITORING, ED - Abnormal; Notable for the following components:   Glucose-Capillary 144 (*)    All other components within normal limits  POCT I-STAT EG7 - Abnormal; Notable for the following components:   pH, Ven 7.461 (*)    pCO2, Ven 39.1 (*)    pO2, Ven 77.0 (*)    Acid-Base Excess 4.0 (*)    Calcium, Ion 1.05 (*)    HCT 36.0 (*)    Hemoglobin 12.2 (*)    All other components within normal limits  SALICYLATE LEVEL  ETHANOL  VALPROIC ACID LEVEL  URINALYSIS, ROUTINE W REFLEX MICROSCOPIC  RAPID URINE DRUG SCREEN, HOSP PERFORMED  BLOOD GAS, VENOUS  CBG MONITORING, ED    EKG None  Radiology Ct Head Wo Contrast  Result Date: 05/02/2018 CLINICAL DATA:  Weakness and lethargy. EXAM: CT HEAD WITHOUT CONTRAST TECHNIQUE: Contiguous axial images were obtained from the base of the skull through the vertex without intravenous contrast. COMPARISON:  04/18/2018. 02/16/2018.  FINDINGS: Brain: Mild generalized atrophy. Minor chronic small-vessel ischemic changes of the white matter and right basal ganglia. No sign of acute infarction, mass lesion, hemorrhage, hydrocephalus or extra-axial collection. Vascular: No abnormal vascular finding. Skull: Normal Sinuses/Orbits: Opacified left frontal sinus. Other sinuses are clear. Orbits negative. Other: None IMPRESSION: 1. No acute finding by CT. Mild generalized atrophy. Minor chronic small-vessel ischemic changes of the white matter and right basal ganglia. 2. Left frontal sinus opacification. Electronically Signed   By: Nelson Chimes M.D.   On: 05/02/2018 19:15    Procedures Procedures (including critical care time)  Medications Ordered in ED Medications - No data to display   Initial Impression / Assessment and Plan / ED Course  I have reviewed the triage vital signs and the nursing notes.  Pertinent labs & imaging results that were available during my care of the patient were reviewed by me and considered in my medical decision making (see chart for details).     MDM:  Imaging: CT head shows no acute abnormality.  Mild generalized atrophy.  Minor chronic small vessel ischemic changes of white matter and right basal ganglia.  Left frontal sinus opacification.  Labs: CBG 144, ethanol negative, salicylate negative, Tylenol negative, ammonia 47, CMP with creatinine of 1.7 which is his baseline, Depakote 60, VBG pH 7.4 6/39/77/27  On initial evaluation, patient appears chronically ill. Afebrile and hemodynamically stable. Alert and oriented at his baseline.  Presents with being "off balance" and speech difficulty with generalized weakness that agreeable  after discharge from the hospital as detailed above.  Chart review shows patient was admitted on January 10 for what appeared to be metabolic encephalopathy.  Psychiatry recommended no changes to his medicines and no clear identifiable cause was found.  No changes in his meds  were made.  He has been compliant with all his meds since discharge.  They did not feel it was related to seizures.  On exam, patient has what appears to be dysarthria.  Considered but overall low suspicion for expressive aphasia.  He has no focal neurologic deficit although does require coaching through exam to follow commands.  No pronator drift or asterixis.  No focal weakness or unilateral deficits including sensory deficits.  2+ pulses bilaterally.  No cranial nerve deficits.  On review of his chart the patient has chronic dysarthria.  Tolerating secretions without difficulty.  No fevers, chills, or sources reported by patient or his caregiver.  No leukocytosis here.  Low suspicion for infectious etiology.  No metabolic derangements.  Renal function at baseline.  No evidence for hepatic encephalopathy.  Depakote level is therapeutic.  No evidence for intoxication.  He is able to ambulate without assistance including turning around and walking back to the bed to sit down.  No nystagmus on exam and no report of lightheadedness or vertigo.  Suspect this is near his baseline and may have some component of his polypharmacy for his schizophrenia and mental retardation as well as seizure disorder.  I attempted to contact his guardian multiple times but was unable.  Left a generic voicemail without identifying data regarding call back to the ED.  Patient was discharged in stable condition awaiting transportation back to his facility.    The plan for this patient was discussed with Dr. Ashok Cordia who voiced agreement and who oversaw evaluation and treatment of this patient.    Final Clinical Impressions(s) / ED Diagnoses   Final diagnoses:  Dysarthria    ED Discharge Orders    None       Arien Morine, Rodena Goldmann, MD 05/03/18 2694    Lajean Saver, MD 05/05/18 1215

## 2018-05-02 NOTE — ED Notes (Signed)
I asked pt for a urine sample  Unable to understand his reply  Muffled speech

## 2018-05-02 NOTE — ED Notes (Signed)
Check CBG 95, RN Chris informed

## 2018-05-02 NOTE — ED Triage Notes (Signed)
PT arrives with group home representative/care provider. Pt is here for re-evlauation of weakness/lethargy. Pt was here 10th and discharged the 12th. She states his speech was normally soft at baseline. Pt answering question with soft slurred speech at present. She also reports he has not had a BM in 4 days. Pt alert talking and maintaining his airway. She also states he has been more "hunched over" and off balance.

## 2018-07-03 NOTE — Progress Notes (Signed)
REVIEWED-NO ADDITIONAL RECOMMENDATIONS. 

## 2018-07-18 ENCOUNTER — Other Ambulatory Visit: Payer: Self-pay | Admitting: Infectious Disease

## 2018-07-23 ENCOUNTER — Other Ambulatory Visit: Payer: Self-pay | Admitting: Infectious Disease

## 2018-08-06 ENCOUNTER — Other Ambulatory Visit: Payer: Self-pay

## 2018-08-06 ENCOUNTER — Encounter: Payer: Self-pay | Admitting: Neurology

## 2018-08-06 ENCOUNTER — Ambulatory Visit (INDEPENDENT_AMBULATORY_CARE_PROVIDER_SITE_OTHER): Payer: Medicare Other | Admitting: Neurology

## 2018-08-06 DIAGNOSIS — R569 Unspecified convulsions: Secondary | ICD-10-CM

## 2018-08-06 NOTE — Progress Notes (Signed)
Virtual Visit via [email or web address]  I connected with Nathan [name] on 08/06/18 at 10:15 AM EDT by telemedicine application, [email or web address] and verified that I am speaking with the correct person using two identifiers.   I discussed the limitations, risks, security and privacy concerns of performing an evaluation and management service by telephone and the availability of in person appointments. I also discussed with the patient that there may be a patient responsible charge related to this service. The patient expressed understanding and agreed to proceed.   History of Present Illness: 02/09/2018 Dr. Krista [name]: Nathan [name] is a 58 year old male seen in request by her primary care physician Dr. Sallee [name] for evaluation of seizure, initial evaluation was on February 10, 2018.  He is accompanied by his group home manager Nathan [name] at today's visit.  He has lived in current group home for 3 years,  I have reviewed and summarized the referring note from the referring physician.  He had a past medical history of hyperlipidemia, schizophrenia, HIV, on polypharmacy treatment.  He had two witnessed seizures, the first 1 was in 2018, he suddenly fell out of the chair, whole body shaking, confused afterwards, lasting for few minutes, most recent one was on November 26, 2017, was taken to the emergency room, she was noted to have sudden onset rhythmic body jerking, patient was unable to provide detailed history, he denies any prodrome, has no retraction of the event,  He was already taking Depakote 250 mg 2 tablets in the morning, 4 tablets at night, then Keppra 500 mg twice daily was started since November 27, 2017, he tolerated the medication well, there was no significant side effect noted.  Reviewed laboratory evaluation at emergency room, Depakote level was less than 10, suggest noncompliance, UDS was positive for benzodiazepine, CMP showed elevated creatinine 2.1, hemoglobin mildly decreased 12.2, CD4 count  was 750, HIV virus load was not detected  Nathan [name] also reported that over the past few months, he had intermittent catching while walking, sometimes he is very sleepy after taking his medications, lack of coordination,  Update August 06, 2018 SS: Taking valproic acid 250 mg (Depakene), 2 tablets in the morning, 4 tablets at bedtime, keppra 500 mg twice daily, November 2019 abnormal EEG with mild background slowing, but no evidence of epileptic form discharges  April 17 2018, hospital admission for AMS, lethargic after 2-day manic-like state, CT scan showed no abnormalities, admitted for acute encephalopathy,unsure if related to seizure, post-ictal state versus medication, no changes made to medications by psych, history of HIV, Schizophrenia, bipolar disorder, valproic acid level was 55, no seizures observed during hospitalization  May 02 2018 ED admission for AMS, requiring more assistance with walking, leaning, worsening dysarthria, expressive aphasia, evaluation was unremarkable, was discharged, depakote level WNL 60, ammonia slightly high 47 (9-35),  I did a video visit with NathanBarton, group home manager, Nathan [name].  She reports he has been doing great since his hospitalization in January 2020.  She denies any seizure activity.  It is felt that he probably had a seizure in January 2020, prompting hospitalization?  She reports his walking is better, he continues to drag his feet.  He has not had any falls.  He has a good appetite, cognition/mentation is at baseline.  There has not been any issues. He has been compliant with medications.   Observations/Objective: Alert, speech is muffled, slow, able to follow commands, facial symmetry noted, in good spirits, pleasant, no arm drift, shuffling, short stepped gait  Assessment  and Plan: 1.  Seizure  Possibly his most recent seizure was in January 2020.  His drug levels were therapeutic at time of both hospitalizations in January 2020, Altered  mental status in the setting of polypharmacy or post-ictal?  Overall he appears to be doing quite well, endorsed by his caregiver, Nathan [name].  I will check blood work today to ensure tolerability of medications.  He will continue taking Keppra 500 mg, twice daily, valproic acid (Depakene) 250 mg capsule, 2 capsules in the morning, 4 capsules at bedtime. He appears to be tolerating the medication well.  It appears that his walking has improved, he has not had any falls.  He will continue to remain active.  They will let us know of any recurrent seizure activity.  Follow Up Instructions: 4-6 months    I discussed the assessment and treatment plan with the patient. The patient was provided an opportunity to ask questions and all were answered. The patient agreed with the plan and demonstrated an understanding of the instructions.   The patient was advised to call back or seek an in-person evaluation if the symptoms worsen or if the condition fails to improve as anticipated.  I provided 20 minutes of non-face-to-face time during this encounter.   Evangeline Dakin, DNP  [facility] [address], [place], [place] [address] [phone]

## 2018-08-11 NOTE — Progress Notes (Signed)
I have reviewed and agreed above plan. 

## 2018-08-19 ENCOUNTER — Other Ambulatory Visit: Payer: Self-pay | Admitting: Infectious Disease

## 2018-08-25 ENCOUNTER — Telehealth: Payer: Self-pay | Admitting: *Deleted

## 2018-08-25 NOTE — Telephone Encounter (Signed)
LM with caregiver, ms stinson, she was not able to do at this time, but will call back to schedule 5 mo appt (around 01-06-19).

## 2018-09-02 ENCOUNTER — Other Ambulatory Visit: Payer: Self-pay | Admitting: Infectious Disease

## 2018-09-03 ENCOUNTER — Other Ambulatory Visit: Payer: Self-pay

## 2018-09-07 ENCOUNTER — Other Ambulatory Visit: Payer: Medicare Other

## 2018-09-09 ENCOUNTER — Other Ambulatory Visit: Payer: Self-pay

## 2018-09-09 ENCOUNTER — Other Ambulatory Visit: Payer: Medicare Other

## 2018-09-09 DIAGNOSIS — B20 Human immunodeficiency virus [HIV] disease: Secondary | ICD-10-CM

## 2018-09-10 LAB — T-HELPER CELL (CD4) - (RCID CLINIC ONLY)
CD4 % Helper T Cell: 30 % — ABNORMAL LOW (ref 33–65)
CD4 T Cell Abs: 567 /uL (ref 400–1790)

## 2018-09-16 ENCOUNTER — Ambulatory Visit: Payer: Medicare Other | Admitting: Adult Health

## 2018-09-16 ENCOUNTER — Ambulatory Visit: Payer: Medicare Other | Admitting: Neurology

## 2018-09-17 ENCOUNTER — Telehealth: Payer: Self-pay | Admitting: *Deleted

## 2018-09-17 ENCOUNTER — Other Ambulatory Visit: Payer: Self-pay | Admitting: Infectious Disease

## 2018-09-17 NOTE — Telephone Encounter (Signed)
Due to current COVID 19 pandemic, our office is severely reducing in office visits until further notice, in order to minimize the risk to our patients and healthcare providers.  Pts caregiver understands that although there may be some limitations with this type of visit, we will take all precautions to reduce any security or privacy concerns.  Pts caregiver understands that this will be treated like an in office visit and we will file with pt's insurance, and there may be a patient responsible charge related to this service.  She consented to [email or web address] visit.  email sent to [email or web address].

## 2018-09-20 NOTE — Progress Notes (Signed)
Virtual Visit via Video Note  I connected with Nathan [name] on 09/21/18 at  1:15 PM EDT by a video enabled telemedicine application and verified that I am speaking with the correct person using two identifiers.  Location: Patient: At his home Provider: In the office    I discussed the limitations of evaluation and management by telemedicine and the availability of in person appointments. The patient expressed understanding and agreed to proceed.  History of Present Illness: 02/09/2018 Dr. Krista Blue: Nathan Stadler Simpsonis a 58 year old maleseen in request byher primary care physician Dr. Lauretta Chester evaluation of seizure, initial evaluation was on February 10, 2018. He is accompanied by his group home manager Nathan [name] at today's visit. He has lived in current group home for 3 years,  I have reviewed and summarized the referring note from the referring physician.He had a past medical history of hyperlipidemia, schizophrenia, HIV, on polypharmacy treatment.  He had twowitnessed seizures, the first 1 was in 2018, he suddenly fell out of the chair, whole body shaking, confused afterwards, lasting for few minutes, most recent one was on November 26, 2017, was taken to the emergency room, she was noted to have sudden onset rhythmic body jerking, patient was unable to provide detailed history, he denies any prodrome, has no retraction of the event,  He was already taking Depakote 250 mg 2 tablets in the morning, 4 tablets at night, then Keppra 500 mg twice daily was started since November 27, 2017, he tolerated the medication well, there was no significant side effect noted.  Reviewed laboratory evaluation at emergency room, Depakote level was less than 10, suggest noncompliance, UDS was positive for benzodiazepine, CMP showed elevated creatinine 2.1, hemoglobin mildly decreased 12.2, CD4 count was 750, HIV virus load was not detected  Nathan [name] also reported that over the past few months,  he had intermittent catching while walking, sometimes he is very sleepy after taking his medications, lack of coordination,  Update August 06, 2018 SS: Taking valproic acid 250 mg (Depakene), 2 tablets in the morning, 4 tablets at bedtime, keppra 500 mg twice daily, November 2019 abnormal EEG with mild background slowing, but no evidence of epileptic form discharges  April 17 2018, hospital admission for AMS, lethargic after 2-day manic-like state, CT scan showed no abnormalities, admitted for acute encephalopathy,unsure if related to seizure, post-ictal state versus medication, no changes made to medications by psych, history of HIV, Schizophrenia, bipolar disorder, valproic acid level was 55, no seizures observed during hospitalization  May 02 2018 ED admission for AMS, requiring more assistance with walking, leaning, worsening dysarthria, expressive aphasia, evaluation was unremarkable, was discharged, depakote level WNL 60, ammonia slightly high 47 (9-35),  I did a video visit with Nathan [name], group home manager, Nathan [name].  She reports he has been doing great since his hospitalization in January 2020.  She denies any seizure activity.  It is felt that he probably had a seizure in January 2020, prompting hospitalization?  She reports his walking is better, he continues to drag his feet.  He has not had any falls.  He has a good appetite, cognition/mentation is at baseline.  There has not been any issues. He has been compliant with medications.   Update September 21, 2018 SS: Nathan [name] says he is doing great, has not had recurrent seizure, his walking has remained the same, he still drags his feet, he continues taking Keppra 500 mg twice daily,Valproic acid (Depakene) 250 mg capsule, 2 capsules in the morning, 4 capsules  at bedtime, no new problems or concerns.  Observations/Objective: VV with Nathan [name] and his caregiver, Nathan [name] Alert, smiling, answers questions, relies on caregivers for history,  follows commands, speech somewhat slurred, facial symmetry noted, no arm drift, gait is moderate pace, able to stand from seated position unassisted, good turns, some shuffling of the feet  Assessment and Plan: 1.  Seizure 2. Unsteady gait  He has not had recurrent seizure, last seizure may have been in January 2020.  I will reorder lab work, was ordered in April 2020, but was not completed.  He had a CMP recently, Creatinine was 1.66, was done by infectious disease.  His gait is stable, has improved from prior.  He will continue taking Keppra 500 mg twice daily, valproic acid (Depakene) 250 mg capsule, 2 capsules in the morning, 4 capsules at bedtime.  They will follow-up in 6 months with Dr. Krista Blue or sooner if needed.   Follow Up Instructions: 6 months Dr. Krista Blue 03/23/2019 9:00   I discussed the assessment and treatment plan with the patient. The patient was provided an opportunity to ask questions and all were answered. The patient agreed with the plan and demonstrated an understanding of the instructions.   The patient was advised to call back or seek an in-person evaluation if the symptoms worsen or if the condition fails to improve as anticipated.  I provided 15 minutes of non-face-to-face time during this encounter.   Evangeline Dakin, DNP  [facility] [address] [place], [place] [address] [phone]

## 2018-09-21 ENCOUNTER — Other Ambulatory Visit: Payer: Self-pay

## 2018-09-21 ENCOUNTER — Encounter: Payer: Self-pay | Admitting: Neurology

## 2018-09-21 ENCOUNTER — Ambulatory Visit (INDEPENDENT_AMBULATORY_CARE_PROVIDER_SITE_OTHER): Payer: Medicare Other | Admitting: Neurology

## 2018-09-21 DIAGNOSIS — R569 Unspecified convulsions: Secondary | ICD-10-CM

## 2018-09-21 NOTE — Progress Notes (Signed)
I have reviewed and agreed above plan. 

## 2018-09-23 ENCOUNTER — Telehealth: Payer: Self-pay | Admitting: Infectious Disease

## 2018-09-23 NOTE — Telephone Encounter (Signed)
[id number] Pre-Screening Questions: 09/23/18  Do you currently have a fever (>100 F), chills or unexplained body aches? NO  Are you currently experiencing new cough, shortness of breath, sore throat, runny nose?NO   Have you recently travelled outside the state of [place] in the last 14 days?NO   Have you been in contact with someone that is currently pending confirmation of Covid19 testing or has been confirmed to have the [place] virus?  NO  **If the patient answers NO to ALL questions -  advise the patient to please call the clinic before coming to the office should any symptoms develop.

## 2018-09-24 ENCOUNTER — Other Ambulatory Visit (INDEPENDENT_AMBULATORY_CARE_PROVIDER_SITE_OTHER): Payer: Self-pay

## 2018-09-24 ENCOUNTER — Ambulatory Visit (INDEPENDENT_AMBULATORY_CARE_PROVIDER_SITE_OTHER): Payer: Medicare Other | Admitting: Infectious Disease

## 2018-09-24 ENCOUNTER — Other Ambulatory Visit: Payer: Self-pay

## 2018-09-24 ENCOUNTER — Encounter: Payer: Self-pay | Admitting: Infectious Disease

## 2018-09-24 VITALS — BP 127/78 | HR 88 | Temp 98.3°F | Wt 185.0 lb

## 2018-09-24 DIAGNOSIS — B20 Human immunodeficiency virus [HIV] disease: Secondary | ICD-10-CM

## 2018-09-24 DIAGNOSIS — R4189 Other symptoms and signs involving cognitive functions and awareness: Secondary | ICD-10-CM

## 2018-09-24 DIAGNOSIS — F319 Bipolar disorder, unspecified: Secondary | ICD-10-CM | POA: Diagnosis not present

## 2018-09-24 DIAGNOSIS — R569 Unspecified convulsions: Secondary | ICD-10-CM

## 2018-09-24 DIAGNOSIS — Z0289 Encounter for other administrative examinations: Secondary | ICD-10-CM

## 2018-09-24 DIAGNOSIS — F258 Other schizoaffective disorders: Secondary | ICD-10-CM

## 2018-09-24 LAB — HIV-1 RNA QUANT-NO REFLEX-BLD
HIV 1 RNA Quant: <20 copies/mL
HIV-1 RNA Quant, Log: <1.3 Log copies/mL

## 2018-09-24 LAB — COMPREHENSIVE METABOLIC PANEL
AG Ratio: 1.3 (calc) (ref 1.0–2.5)
ALT: 15 U/L (ref 9–46)
AST: 17 U/L (ref 10–35)
Albumin: 4.1 g/dL (ref 3.6–5.1)
Alkaline phosphatase (APISO): 54 U/L (ref 35–144)
BUN/Creatinine Ratio: 10 (calc) (ref 6–22)
BUN: 16 mg/dL (ref 7–25)
CO2: 28 mmol/L (ref 20–32)
Calcium: 9.2 mg/dL (ref 8.6–10.3)
Chloride: 107 mmol/L (ref 98–110)
Creat: 1.66 mg/dL — ABNORMAL HIGH (ref 0.70–1.33)
Globulin: 3.2 g/dL (calc) (ref 1.9–3.7)
Glucose, Bld: 166 mg/dL — ABNORMAL HIGH (ref 65–99)
Potassium: 4.4 mmol/L (ref 3.5–5.3)
Sodium: 143 mmol/L (ref 135–146)
Total Bilirubin: 0.3 mg/dL (ref 0.2–1.2)
Total Protein: 7.3 g/dL (ref 6.1–8.1)

## 2018-09-24 LAB — RPR: RPR Ser Ql: NONREACTIVE

## 2018-09-24 MED ORDER — BIKTARVY 50-200-25 MG PO TABS: 1.0000 | ORAL_TABLET | Freq: Every day | ORAL | 11 refills | Status: DC

## 2018-09-24 NOTE — Patient Instructions (Signed)
Same day labs

## 2018-09-24 NOTE — Progress Notes (Signed)
Subjective:   Chief complaint : followup for HIV, cognitive disability   Patient ID: Nathan [name], male    DOB: 05/21/1960, 58 y.o.   MRN: [id number]  HPI  Mr. Polinsky is a 58 year old man with multiple medical problems including HIV AIDS, cognitive disability with aggressive behavior bipolar disorder who had been followed here for some time on [place] NORVIR and Epzicom then had not been seen since 2015 in the interim having been followed at [facility] as recently as  2017. He was changed to [place] has been nicely suppressed on this regimen. He was incarcerated at [facility] for some time and now has been released and wanted to reestablish care here in [facility] he has done  He has done very well on this regimen THOUGH HIS ABACAVIR wa iniitally not never stoppped at the SNF where he resides. He is now at a [facility]  He has been switched to [place] and has done very well on this regimen.  Lab Results  Component Value Date   HIV1RNAQUANT <20 NOT DETECTED 09/09/2018   HIV1RNAQUANT <20 NOT DETECTED 01/15/2018   HIV1RNAQUANT <20 DETECTED (A) 07/21/2017   Lab Results  Component Value Date   CD4TABS 567 09/09/2018   CD4TABS 750 01/15/2018   CD4TABS 690 07/21/2017     Past Medical History:  Diagnosis Date  . Anemia   . Anemia   . Anxiety   . Bipolar 1 disorder ([place])   . Depression, major   . HIV (human immunodeficiency virus infection) ([place])   . Hyperglycemia 08/06/2017  . Hypernatremia   . Kidney failure   . Lithium toxicity   . MR (mental retardation)   . Schizoaffective disorder ([place]) 08/06/2017  . Schizophrenia ([place])   . Seizures ([place])     Past Surgical History:  Procedure Laterality Date  . BIOPSY N/A 04/12/2014   Procedure: BIOPSY;  Surgeon: Danie Binder, MD;  Location: [facility];  Service: Endoscopy;  Laterality: N/A;  Gastric  . COLONOSCOPY WITH PROPOFOL N/A 04/12/2014   SLF:  1. one colon polyp removed 2. The left colon is extremely  redundant 3. Small internal hemorrhoids  . ESOPHAGOGASTRODUODENOSCOPY (EGD) WITH PROPOFOL N/A 04/12/2014   SLF: Stricture at the gastroesophageal junction 2. small polyp  in teh gastric body 3. Mild Non-erosive gastritis.   Marland Kitchen NOSE SURGERY  1968   unsure what type of surgery. something to help him breathe better  . POLYPECTOMY N/A 04/12/2014   Procedure: POLYPECTOMY;  Surgeon: Danie Binder, MD;  Location: [facility];  Service: Endoscopy;  Laterality: N/A;  Cecal  . SAVORY DILATION N/A 04/12/2014   Procedure: SAVORY DILATION;  Surgeon: Danie Binder, MD;  Location: [facility];  Service: Endoscopy;  Laterality: N/A;  12.8/ 14/15/16  . SKIN BIOPSY      Family History  Problem Relation Age of Onset  . Other Mother        unknown medical history  . Other Father        unknown medical history  . Colon cancer Neg Hx       Social History   Socioeconomic History  . Marital status: Single    Spouse name: Not on file  . Number of children: 0  . Years of education: 9th grade  . Highest education level: Not on file  Occupational History  . Occupation: Disabled  Social Needs  . Financial resource strain: Not on file  . Food insecurity  Worry: Not on file    Inability: Not on file  . Transportation needs    Medical: Not on file    Non-medical: Not on file  Tobacco Use  . Smoking status: Never Smoker  . Smokeless tobacco: Never Used  Substance and Sexual Activity  . Alcohol use: No    Alcohol/week: 0.0 standard drinks  . Drug use: No  . Sexual activity: Never    Birth control/protection: Abstinence    Comment: [place]CL[place]ED [place]  Lifestyle  . Physical activity    Days per week: Not on file    Minutes per session: Not on file  . Stress: Not on file  Relationships  . Social Herbalist on phone: Not on file    Gets together: Not on file    Attends religious service: Not on file    Active member of club or organization: Not on file    Attends meetings of clubs or organizations:  Not on file    Relationship status: Not on file  Other Topics Concern  . Not on file  Social History Narrative   Lives in group home.   Right-handed.   2-3 sodas per day.    No Known Allergies   Current Outpatient Medications:  .  aspirin 81 MG chewable tablet, Chew 81 mg by mouth daily., Disp: , Rfl:  .  atorvastatin (LIPITOR) 20 MG tablet, Take 20 mg by mouth daily at 6 PM., Disp: , Rfl:  .  benztropine (COGENTIN) 2 MG tablet, Take 2 mg by mouth 2 (two) times daily. , Disp: , Rfl:  .  BIKTARVY 50-200-25 MG TABS tablet, TAKE 1 TABLET BY MOUTH DAILY., Disp: 30 tablet, Rfl: 6 .  cetirizine (ZYRTEC) 10 MG tablet, TAKE 1 TABLET ONCE DAILY., Disp: 30 tablet, Rfl: 3 .  Cholecalciferol (VITAMIN D3 PO), Take 1 tablet by mouth daily., Disp: , Rfl:  .  clozapine (FAZACLO) 100 MG disintegrating tablet, Take 50-100 mg by mouth See admin instructions. 100mg  in the morning and 50mg  at bedtime, Disp: , Rfl:  .  folic acid (FOLVITE) 1 MG tablet, Take 1 mg by mouth daily., Disp: , Rfl:  .  gabapentin (NEURONTIN) 300 MG capsule, Take 300 mg by mouth every morning. Take with the 400mg  tablet to equal a total of 700mg  in the mornings., Disp: , Rfl:  .  gabapentin (NEURONTIN) 400 MG capsule, Take 400 mg by mouth 2 (two) times daily. , Disp: , Rfl:  .  haloperidol (HALDOL) 5 MG tablet, Take 2 tablets (10 mg total) by mouth daily with supper. (Patient taking differently: Take 5 mg by mouth daily with supper. ), Disp: , Rfl:  .  haloperidol decanoate (HALDOL DECANOATE) 50 MG/ML injection, Inject 50 mg into the muscle every 28 (twenty-eight) days., Disp: , Rfl:  .  levETIRAcetam (KEPPRA) 500 MG tablet, Take 1 tablet (500 mg total) by mouth 2 (two) times daily., Disp: 180 tablet, Rfl: 4 .  metFORMIN (GLUCOPHAGE-XR) 500 MG 24 hr tablet, Take 500 mg by mouth 2 (two) times daily., Disp: , Rfl:  .  nystatin (MYCOSTATIN/NYSTOP) powder, Apply topically 4 (four) times daily., Disp: , Rfl:  .  polyethylene glycol  (MIRALAX / GLYCOLAX) packet, Take 17 g by mouth daily., Disp: 30 each, Rfl: 0 .  ranitidine (ZANTAC) 150 MG capsule, Take 150 mg by mouth 2 (two) times daily., Disp: , Rfl:  .  senna (SENOKOT) 8.6 MG tablet, Take 2 tablets by mouth 2 (two) times daily.,  Disp: , Rfl:  .  terbinafine (LAMISIL) 250 MG tablet, Take 250 mg by mouth daily., Disp: , Rfl:  .  valproic acid (DEPAKENE) 250 MG capsule, Take 2 capsules every morning then take 4 capsules at bedtime (Patient taking differently: Take 500-1,000 mg by mouth See admin instructions. Take 2 capsules (500mg ) every morning then take 4 capsules (1000mg ) at bedtime), Disp: 180 capsule, Rfl: 11   Review of Systems  Unable to perform ROS: Psychiatric disorder       Objective:   Physical Exam  Constitutional: He is oriented to person, place, and time. He appears well-developed and well-nourished. No distress.  HENT:  Head: Normocephalic and atraumatic.  Mouth/Throat: No oropharyngeal exudate.  Eyes: Conjunctivae and EOM are normal. No scleral icterus.  Neck: Normal range of motion. Neck supple.  Cardiovascular: Normal rate and regular rhythm.  Pulmonary/Chest: Effort normal. No respiratory distress. He has no wheezes.  Abdominal: He exhibits no distension.  Musculoskeletal:        General: No tenderness or edema.  Neurological: He is alert and oriented to person, place, and time. He exhibits normal muscle tone. Coordination normal.  Skin: Skin is warm and dry. No rash noted. He is not diaphoretic. No erythema. No pallor.  Psychiatric: His speech is delayed and slurred. He is slowed. Cognition and memory are impaired.          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  Chronic kidney disease renal stable  Bipolar disorder continue VPA

## 2018-09-26 LAB — CBC WITH DIFFERENTIAL/PLATELET
Basophils Absolute: 0 10*3/uL (ref 0.0–0.2)
Basos: 0 %
EOS (ABSOLUTE): 0.4 10*3/uL (ref 0.0–0.4)
Eos: 6 %
Hematocrit: 36.4 % — ABNORMAL LOW (ref 37.5–51.0)
Hemoglobin: 12.2 g/dL — ABNORMAL LOW (ref 13.0–17.7)
Immature Grans (Abs): 0 10*3/uL (ref 0.0–0.1)
Immature Granulocytes: 1 %
Lymphocytes Absolute: 2.3 10*3/uL (ref 0.7–3.1)
Lymphs: 34 %
MCH: 29.4 pg (ref 26.6–33.0)
MCHC: 33.5 g/dL (ref 31.5–35.7)
MCV: 88 fL (ref 79–97)
Monocytes Absolute: 0.6 10*3/uL (ref 0.1–0.9)
Monocytes: 9 %
Neutrophils Absolute: 3.3 10*3/uL (ref 1.4–7.0)
Neutrophils: 50 %
Platelets: 121 10*3/uL — ABNORMAL LOW (ref 150–450)
RBC: 4.15 x10E6/uL (ref 4.14–5.80)
RDW: 14.1 % (ref 11.6–15.4)
WBC: 6.6 10*3/uL (ref 3.4–10.8)

## 2018-09-26 LAB — COMPREHENSIVE METABOLIC PANEL
ALT: 17 IU/L (ref 0–44)
AST: 16 IU/L (ref 0–40)
Albumin/Globulin Ratio: 1.4 (ref 1.2–2.2)
Albumin: 4.6 g/dL (ref 3.8–4.9)
Alkaline Phosphatase: 58 IU/L (ref 39–117)
BUN/Creatinine Ratio: 11 (ref 9–20)
BUN: 18 mg/dL (ref 6–24)
Bilirubin Total: <0.2 mg/dL (ref 0.0–1.2)
CO2: 28 mmol/L (ref 20–29)
Calcium: 9.6 mg/dL (ref 8.7–10.2)
Chloride: 102 mmol/L (ref 96–106)
Creatinine, Ser: 1.62 mg/dL — ABNORMAL HIGH (ref 0.76–1.27)
GFR calc Af Amer: 53 mL/min/{1.73_m2} — ABNORMAL LOW (ref >59)
GFR calc non Af Amer: 46 mL/min/{1.73_m2} — ABNORMAL LOW (ref >59)
Globulin, Total: 3.2 g/dL (ref 1.5–4.5)
Glucose: 127 mg/dL — ABNORMAL HIGH (ref 65–99)
Potassium: 4.3 mmol/L (ref 3.5–5.2)
Sodium: 143 mmol/L (ref 134–144)
Total Protein: 7.8 g/dL (ref 6.0–8.5)

## 2018-09-26 LAB — LEVETIRACETAM LEVEL: Levetiracetam Lvl: 16.4 ug/mL (ref 10.0–40.0)

## 2018-09-26 LAB — VALPROIC ACID LEVEL: Valproic Acid Lvl: 63 ug/mL (ref 50–100)

## 2018-09-28 ENCOUNTER — Telehealth: Payer: Self-pay

## 2018-09-28 NOTE — Telephone Encounter (Signed)
-----   Message from Suzzanne Cloud, NP sent at 09/28/2018 10:52 AM EDT ----- Please call the patient. Labs look stable. Stable low HBG 12.2, platelets 121, creatinine 1.62.

## 2018-09-28 NOTE — Telephone Encounter (Signed)
Spoke with the patient's care giver([name] Elly Modena) and she verbalized understanding his results. No other questions or concerns at this time.

## 2019-01-06 ENCOUNTER — Other Ambulatory Visit: Payer: Self-pay

## 2019-01-06 ENCOUNTER — Emergency Department (HOSPITAL_COMMUNITY)
Admission: EM | Admit: 2019-01-06 | Discharge: 2019-01-06 | Disposition: A | Discharge status: Home / Self Care | Payer: Medicare Other | Attending: Emergency Medicine | Admitting: Emergency Medicine

## 2019-01-06 DIAGNOSIS — N183 Chronic kidney disease, stage 3 (moderate): Secondary | ICD-10-CM | POA: Diagnosis not present

## 2019-01-06 DIAGNOSIS — Z7984 Long term (current) use of oral hypoglycemic drugs: Secondary | ICD-10-CM | POA: Insufficient documentation

## 2019-01-06 DIAGNOSIS — R569 Unspecified convulsions: Secondary | ICD-10-CM | POA: Insufficient documentation

## 2019-01-06 DIAGNOSIS — Z7982 Long term (current) use of aspirin: Secondary | ICD-10-CM | POA: Diagnosis not present

## 2019-01-06 DIAGNOSIS — Z21 Asymptomatic human immunodeficiency virus [HIV] infection status: Secondary | ICD-10-CM | POA: Diagnosis not present

## 2019-01-06 DIAGNOSIS — F79 Unspecified intellectual disabilities: Secondary | ICD-10-CM | POA: Diagnosis not present

## 2019-01-06 DIAGNOSIS — Z79899 Other long term (current) drug therapy: Secondary | ICD-10-CM | POA: Diagnosis not present

## 2019-01-06 LAB — CBC WITH DIFFERENTIAL/PLATELET
Abs Immature Granulocytes: 0.02 10*3/uL (ref 0.00–0.07)
Basophils Absolute: 0 10*3/uL (ref 0.0–0.1)
Basophils Relative: 0 %
Eosinophils Absolute: 0.4 10*3/uL (ref 0.0–0.5)
Eosinophils Relative: 6 %
HCT: 37 % — ABNORMAL LOW (ref 39.0–52.0)
Hemoglobin: 11.8 g/dL — ABNORMAL LOW (ref 13.0–17.0)
Immature Granulocytes: 0 %
Lymphocytes Relative: 27 %
Lymphs Abs: 2 10*3/uL (ref 0.7–4.0)
MCH: 29.9 pg (ref 26.0–34.0)
MCHC: 31.9 g/dL (ref 30.0–36.0)
MCV: 93.7 fL (ref 80.0–100.0)
Monocytes Absolute: 0.7 10*3/uL (ref 0.1–1.0)
Monocytes Relative: 9 %
Neutro Abs: 4.2 10*3/uL (ref 1.7–7.7)
Neutrophils Relative %: 58 %
Platelets: 114 10*3/uL — ABNORMAL LOW (ref 150–400)
RBC: 3.95 MIL/uL — ABNORMAL LOW (ref 4.22–5.81)
RDW: 13.5 % (ref 11.5–15.5)
WBC: 7.3 10*3/uL (ref 4.0–10.5)
nRBC: 0 % (ref 0.0–0.2)

## 2019-01-06 LAB — BASIC METABOLIC PANEL
Anion gap: 9 (ref 5–15)
BUN: 16 mg/dL (ref 6–20)
CO2: 25 mmol/L (ref 22–32)
Calcium: 9 mg/dL (ref 8.9–10.3)
Chloride: 104 mmol/L (ref 98–111)
Creatinine, Ser: 1.56 mg/dL — ABNORMAL HIGH (ref 0.61–1.24)
GFR calc Af Amer: 55 mL/min — ABNORMAL LOW (ref >60)
GFR calc non Af Amer: 48 mL/min — ABNORMAL LOW (ref >60)
Glucose, Bld: 132 mg/dL — ABNORMAL HIGH (ref 70–99)
Potassium: 3.7 mmol/L (ref 3.5–5.1)
Sodium: 138 mmol/L (ref 135–145)

## 2019-01-06 LAB — VALPROIC ACID LEVEL: Valproic Acid Lvl: 61 ug/mL (ref 50.0–100.0)

## 2019-01-06 LAB — CBG MONITORING, ED: Glucose-Capillary: 120 mg/dL — ABNORMAL HIGH (ref 70–99)

## 2019-01-06 MED ORDER — LEVETIRACETAM IN NACL 1000 MG/100ML IV SOLN
1000.0000 mg | Freq: Once | INTRAVENOUS | Status: AC
  Administered 2019-01-06: 1000 mg via INTRAVENOUS
  Filled 2019-01-06: qty 100

## 2019-01-06 NOTE — ED Triage Notes (Signed)
Pt arrived via [facility] from group home with CC "grand mal type" seizure activity lasting 1-2 minutes. Per EMS arrival postical, drooling, not responding verbally, able to follow simple commands, no signs of incontinence or mouth trauma . Hx HIV POSITIVE seizures and psych, LK seizure was over a year ago per caregiver.   18G Left hand CBG 140  Caregiver Jasmine Awe [phone]

## 2019-01-06 NOTE — ED Notes (Signed)
Patient provided drink and repositioned in bed. Denies any other needs at this time. Will continue to monitor patient.

## 2019-01-06 NOTE — ED Notes (Signed)
Pt contact "Cheri" (Legal Guardian) notified of pt condition. Guardian reports pt baseline is "hard to understand with slurred speech as baseline and deemed incompetent " . Will continue to monitor

## 2019-01-06 NOTE — ED Provider Notes (Signed)
[facility] DEPT Provider Note   CSN: [id number] Arrival date & time: 01/06/19  1357     History   Chief Complaint Chief Complaint  Patient presents with  . Seizures    HPI Nathan [name] is a 58 y.o. male.     HPI   Patient arrived by EMS in a postictal state from a reported grand mal seizure.  He is unable to give any history.  Level 5 caveat-altered mental status  Past Medical History:  Diagnosis Date  . Anemia   . Anemia   . Anxiety   . Bipolar 1 disorder ([place])   . Depression, major   . HIV (human immunodeficiency virus infection) ([place])   . Hyperglycemia 08/06/2017  . Hypernatremia   . Kidney failure   . Lithium toxicity   . MR (mental retardation)   . Schizoaffective disorder ([place]) 08/06/2017  . Schizophrenia ([place])   . Seizures [facility])     Patient Active Problem List   Diagnosis Date Noted  . Sepsis due to pneumonia ([place]) 02/16/2018  . Diarrhea 02/16/2018  . Gait abnormality 02/09/2018  . Hyperglycemia 08/06/2017  . Schizoaffective disorder ([place]) 08/06/2017  . Bipolar 1 disorder ([place])   . Schizophrenia ([place])   . Seizures ([place])   . Altered mental state 07/20/2016  . Aggressive behavior   . GERD (gastroesophageal reflux disease) 07/25/2014  . Acute encephalopathy 06/23/2014  . Hypernatremia 06/20/2014  . Dehydration 06/18/2014  . Lithium toxicity 06/18/2014  . ARF (acute renal failure) ([place]) 06/18/2014  . Acute urinary retention 06/18/2014  . CAP (community acquired pneumonia) 06/18/2014  . Encounter for screening colonoscopy 03/25/2014  . Dysphagia, pharyngoesophageal phase 03/25/2014  . CKD (chronic kidney disease) stage 3, GFR 30-59 ml/min (HCC) 07/22/2012  . HIV disease ([place]) 06/19/2012  . Bipolar 1 disorder, mixed, moderate ([place]) 06/19/2012  . Hyponatremia 06/19/2012  . Intellectual disability 06/19/2012    Past Surgical History:  Procedure Laterality Date  . BIOPSY N/A 04/12/2014   Procedure: BIOPSY;   Surgeon: Danie Binder, MD;  Location: [facility] ORS;  Service: Endoscopy;  Laterality: N/A;  Gastric  . COLONOSCOPY WITH PROPOFOL N/A 04/12/2014   SLF:  1. one colon polyp removed 2. The left colon is extremely redundant 3. Small internal hemorrhoids  . ESOPHAGOGASTRODUODENOSCOPY (EGD) WITH PROPOFOL N/A 04/12/2014   SLF: Stricture at the gastroesophageal junction 2. small polyp  in teh gastric body 3. Mild Non-erosive gastritis.   Marland Kitchen NOSE SURGERY  1968   unsure what type of surgery. something to help him breathe better  . POLYPECTOMY N/A 04/12/2014   Procedure: POLYPECTOMY;  Surgeon: Danie Binder, MD;  Location: [facility];  Service: Endoscopy;  Laterality: N/A;  Cecal  . SAVORY DILATION N/A 04/12/2014   Procedure: SAVORY DILATION;  Surgeon: Danie Binder, MD;  Location: [facility];  Service: Endoscopy;  Laterality: N/A;  12.8/ 14/15/16  . SKIN BIOPSY          Home Medications    Prior to Admission medications   Medication Sig Start Date End Date Taking? Authorizing Provider  aspirin 81 MG chewable tablet Chew 81 mg by mouth daily.   Yes [provider]  atorvastatin (LIPITOR) 20 MG tablet Take 20 mg by mouth daily at 6 PM.   Yes [provider]  benztropine (COGENTIN) 2 MG tablet Take 2 mg by mouth 2 (two) times daily.    Yes [provider]  bictegravir-emtricitabine-tenofovir AF (BIKTARVY) 50-200-25 MG TABS tablet Take 1  tablet by mouth daily. 09/24/18  Yes Tommy Medal, Lavell Islam, MD  clozapine (FAZACLO) 100 MG disintegrating tablet Take 50-100 mg by mouth See admin instructions. 100mg  in the morning and 50mg  at bedtime   Yes [provider]  famotidine (PEPCID) 40 MG tablet Take 40 mg by mouth daily. 07/28/18  Yes [provider]  folic acid (FOLVITE) 1 MG tablet Take 1 mg by mouth daily.   Yes [provider]  gabapentin (NEURONTIN) 300 MG capsule Take 300 mg by mouth every morning. Take with the 400mg  tablet to equal a total of 700mg  in the mornings.    Yes [provider]  gabapentin (NEURONTIN) 400 MG capsule Take 400 mg by mouth 2 (two) times daily.    Yes [provider]  haloperidol (HALDOL) 5 MG tablet Take 2 tablets (10 mg total) by mouth daily with supper. Patient taking differently: Take 5 mg by mouth daily with supper.  07/21/16  Yes Rogue Bussing, MD  levETIRAcetam (KEPPRA) 500 MG tablet Take 1 tablet (500 mg total) by mouth 2 (two) times daily. 02/09/18  Yes Marcial Pacas, MD  metFORMIN (GLUCOPHAGE-XR) 500 MG 24 hr tablet Take 250 mg by mouth 2 (two) times daily.    Yes [provider]  polyethylene glycol (MIRALAX / GLYCOLAX) packet Take 17 g by mouth daily. Patient taking differently: Take 17 g by mouth daily as needed for mild constipation.  02/24/18  Yes Thurnell Lose, MD  senna (SENOKOT) 8.6 MG tablet Take 2 tablets by mouth 2 (two) times daily.   Yes [provider]  valproic acid (DEPAKENE) 250 MG capsule Take 2 capsules every morning then take 4 capsules at bedtime Patient taking differently: Take 500-1,000 mg by mouth See admin instructions. Take 2 capsules (500mg ) every morning then take 4 capsules (1000mg ) at bedtime 02/09/18  Yes Marcial Pacas, MD  cetirizine (ZYRTEC) 10 MG tablet TAKE 1 TABLET ONCE DAILY. Patient not taking: Reported on 01/06/2019 01/21/18   Forrest Moron, MD    Family History Family History  Problem Relation Age of Onset  . Other Mother        unknown medical history  . Other Father        unknown medical history  . Colon cancer Neg Hx     Social History Social History   Tobacco Use  . Smoking status: Never Smoker  . Smokeless tobacco: Never Used  Substance Use Topics  . Alcohol use: No    Alcohol/week: 0.0 standard drinks  . Drug use: No     Allergies   Patient has no known allergies.   Review of Systems Review of Systems  Unable to perform ROS: Mental status change     Physical Exam Updated Vital Signs BP 116/75 (BP Location:  Right Arm)   Pulse 77   Resp 16   SpO2 97%   Physical Exam Vitals signs and nursing note reviewed.  Constitutional:      General: He is not in acute distress.    Appearance: He is well-developed. He is not ill-appearing, toxic-appearing or diaphoretic.  HENT:     Head: Normocephalic and atraumatic.     Right Ear: External ear normal.     Left Ear: External ear normal.     Mouth/Throat:     Comments: No trauma to tongue or lips Eyes:     Conjunctiva/sclera: Conjunctivae normal.     Pupils: Pupils are equal, round, and reactive to light.  Neck:  Musculoskeletal: Normal range of motion and neck supple.     Trachea: Phonation normal.  Cardiovascular:     Rate and Rhythm: Normal rate and regular rhythm.     Heart sounds: Normal heart sounds.  Pulmonary:     Effort: Pulmonary effort is normal.     Breath sounds: Normal breath sounds.  Abdominal:     Palpations: Abdomen is soft.     Tenderness: There is no abdominal tenderness.  Musculoskeletal: Normal range of motion.        General: No swelling or tenderness.  Skin:    General: Skin is warm and dry.  Neurological:     Mental Status: He is alert.     Motor: No abnormal muscle tone.     Comments: Sonorous respirations, controlling airway.  Normal tone in arms and legs bilaterally.  No focal asymmetry of muscle tone.  Psychiatric:     Comments: Lethargic      ED Treatments / Results  Labs (all labs ordered are listed, but only abnormal results are displayed) Labs Reviewed  BASIC METABOLIC PANEL - Abnormal; Notable for the following components:      Result Value   Glucose, Bld 132 (*)    Creatinine, Ser 1.56 (*)    GFR calc non Af Amer 48 (*)    GFR calc Af Amer 55 (*)    All other components within normal limits  CBC WITH DIFFERENTIAL/PLATELET - Abnormal; Notable for the following components:   RBC 3.95 (*)    Hemoglobin 11.8 (*)    HCT 37.0 (*)    Platelets 114 (*)    All other components within normal limits   CBG MONITORING, ED - Abnormal; Notable for the following components:   Glucose-Capillary 120 (*)    All other components within normal limits  VALPROIC ACID LEVEL    EKG None  Radiology No results found.  Procedures Procedures (including critical care time)  Medications Ordered in ED Medications  levETIRAcetam (KEPPRA) IVPB 1000 mg/100 mL premix (has no administration in time range)     Initial Impression / Assessment and Plan / ED Course  I have reviewed the triage vital signs and the nursing notes.  Pertinent labs & imaging results that were available during my care of the patient were reviewed by me and considered in my medical decision making (see chart for details).  Clinical Course as of Jan 06 2003  Wed Jan 06, 2019  1708 Dellie Catholic, [phone].   [EW]  1711 Patient's caregiver, Dellie Catholic, is in the ED.  The patient was in her group home where she works.  She was with him today when he had a seizure, described as generalized shaking for 2 to 3 minutes followed by a postictal state.   [EW]  1957 Normal except hemoglobin low, platelets low  CBC with Differential(!) [EW]  1957 Note sent glucose high, creatinine high, GFR low  Basic metabolic panel(!) [EW]  [id number] Therapeutic  Valproic acid level [EW]    Clinical Course User Index [EW] Daleen Bo, MD        Patient Vitals for the past 24 hrs:  BP Pulse Resp SpO2  01/06/19 1930 116/75 77 16 97 %  01/06/19 1900 116/73 74 17 97 %  01/06/19 1830 115/77 73 17 96 %  01/06/19 1800 130/83 73 16 98 %  01/06/19 1730 112/74 79 18 97 %  01/06/19 1715 - - 16 -  01/06/19 1700 115/76 79 16 96 %  01/06/19  1630 115/72 81 16 96 %  01/06/19 1600 112/70 85 15 97 %  01/06/19 1530 111/71 89 18 94 %  01/06/19 1500 126/75 91 (!) 23 95 %  01/06/19 1430 122/72 98 (!) 24 95 %  01/06/19 1415 128/85 97 18 94 %  01/06/19 1411 - - - 94 %    8:04 PM Reevaluation with update and discussion. After initial assessment and  treatment, an updated evaluation reveals he remains lethargic, but is now able to communicate, and requests a soda. Daleen Bo   9:55 PM.  He remained seizure-free.  He is alert and has been tolerating oral liquids.  I attempted to reach his legal guardian but there was no answer either her mobile phone, or her work phone.  I did leave a message at the work phone.  I have also attempted to contact his caregiver/group home attendant, to come and get him but no one answered her phone.  Medical Decision Making: Seizure, patient with known epilepsy, on Keppra and Depakote.  Depakote level normal.  No sign of electrolyte abnormality.  No sign of infection, or significant trauma.  CRITICAL CARE-yes Performed by: Daleen Bo  Nursing Notes Reviewed/ Care Coordinated Applicable Imaging Reviewed Interpretation of Laboratory Data incorporated into ED treatment  The patient appears reasonably screened and/or stabilized for discharge and I doubt any other medical condition or other [facility] requiring further screening, evaluation, or treatment in the ED at this time prior to discharge.  Plan: Home Medications-continue current; Home Treatments-rest, fluids; return here if the recommended treatment, does not improve the symptoms; Recommended follow up-PCP checkup 3 to 5 days, consider referral to neurology.    Final Clinical Impressions(s) / ED Diagnoses   Final diagnoses:  None    ED Discharge Orders    None       Daleen Bo, MD 01/06/19 2159

## 2019-01-06 NOTE — Discharge Instructions (Signed)
It appears that he had a seizure today.  We gave him some extra Keppra, to help prevent further seizures.  Call his doctor for a follow-up appointment in 3 to 5 days.  Ask his doctor if he needs referral to a neurologist for further care and treatment.

## 2019-01-06 NOTE — ED Notes (Signed)
Care giver Glinda at bedside

## 2019-01-06 NOTE — ED Notes (Signed)
Patient assisted to side of bed to use urinal. Patient had difficulty urinating but states that is normal for him. Patient urinated about 500 mLs. Will continue to monitor patient.

## 2019-01-06 NOTE — ED Notes (Addendum)
Patient discharge instructions reviewed with patient and caregiver. Opportunity for questions and concerns allowed with none voiced. Caregiver voiced permission for RN to sign for discharge.

## 2019-01-19 ENCOUNTER — Telehealth: Payer: Self-pay | Admitting: Neurology

## 2019-01-19 NOTE — Telephone Encounter (Signed)
Caregiver wanting sooner apt than what is being offered first week in Dec. Already has a Dec. 15 apt. Has had seizure and requesting sooner apt. Best call back [phone]

## 2019-01-19 NOTE — Telephone Encounter (Signed)
I returned the call to his caregiver.  Reported seizure on 01/13/2019 - back to baseline.  Denies any missed medications.  She would like an earlier follow up.  Dr. Krista Blue and Butler Denmark, NP had no availability this week.  He has been scheduled with Amy Lomax on 01/21/2019 at 11:30am.  They will arrive with the patient at 11am.

## 2019-01-21 ENCOUNTER — Other Ambulatory Visit: Payer: Self-pay

## 2019-01-21 ENCOUNTER — Encounter: Payer: Self-pay | Admitting: Family Medicine

## 2019-01-21 ENCOUNTER — Ambulatory Visit (INDEPENDENT_AMBULATORY_CARE_PROVIDER_SITE_OTHER): Payer: Medicare Other | Admitting: Family Medicine

## 2019-01-21 VITALS — BP 120/80 | HR 83 | Temp 97.9°F | Ht 69.0 in | Wt 189.8 lb

## 2019-01-21 DIAGNOSIS — R569 Unspecified convulsions: Secondary | ICD-10-CM

## 2019-01-21 MED ORDER — LEVETIRACETAM 750 MG PO TABS: 750.0000 mg | ORAL_TABLET | Freq: Two times a day (BID) | ORAL | 11 refills | Status: DC

## 2019-01-21 NOTE — Patient Instructions (Addendum)
We will increase levetiracetam to 750mg  twice daily  Continue Depakote 500mg  in am and 1000mg  in pm  Follow up with Dr Krista Blue in 4-6 weeks    Seizure, Adult A seizure is a sudden burst of abnormal electrical activity in the brain. Seizures usually last from 30 seconds to 2 minutes. They can cause many different symptoms. Usually, seizures are not harmful unless they last a long time. What are the causes? Common causes of this condition include:  Fever or infection.  Conditions that affect the brain, such as: ? A brain abnormality that you were born with. ? A brain or head injury. ? Bleeding in the brain. ? A tumor. ? Stroke. ? Brain disorders such as autism or cerebral palsy.  Low blood sugar.  Conditions that are passed from parent to child (are inherited).  Problems with substances, such as: ? Having a reaction to a drug or a medicine. ? Suddenly stopping the use of a substance (withdrawal). In some cases, the cause may not be known. A person who has repeated seizures over time without a clear cause has a condition called epilepsy. What increases the risk? You are more likely to get this condition if you have:  A family history of epilepsy.  Had a seizure in the past.  A brain disorder.  A history of head injury, lack of oxygen at birth, or strokes. What are the signs or symptoms? There are many types of seizures. The symptoms vary depending on the type of seizure you have. Examples of symptoms during a seizure include:  Shaking (convulsions).  Stiffness in the body.  Passing out (losing consciousness).  Head nodding.  Staring.  Not responding to sound or touch.  Loss of bladder control and bowel control. Some people have symptoms right before and right after a seizure happens. Symptoms before a seizure may include:  Fear.  Worry (anxiety).  Feeling like you may vomit (nauseous).  Feeling like the room is spinning (vertigo).  Feeling like you saw or  heard something before (dj vu).  Odd tastes or smells.  Changes in how you see. You may see flashing lights or spots. Symptoms after a seizure happens can include:  Confusion.  Sleepiness.  Headache.  Weakness on one side of the body. How is this treated? Most seizures will stop on their own in under 5 minutes. In these cases, no treatment is needed. Seizures that last longer than 5 minutes will usually need treatment. Treatment can include:  Medicines given through an IV tube.  Avoiding things that are known to cause your seizures. These can include medicines that you take for another condition.  Medicines to treat epilepsy.  Surgery to stop the seizures. This may be needed if medicines do not help. Follow these instructions at home: Medicines  Take over-the-counter and prescription medicines only as told by your doctor.  Do not eat or drink anything that may keep your medicine from working, such as alcohol. Activity  Do not do any activities that would be dangerous if you had another seizure, like driving or swimming. Wait until your doctor says it is safe for you to do them.  If you live in the [place] ask your local DMV (department of motor vehicles) when you can drive.  Get plenty of rest. Teaching others Teach friends and family what to do when you have a seizure. They should:  Lay you on the ground.  Protect your head and body.  Loosen any tight clothing around your  neck.  Turn you on your side.  Not hold you down.  Not put anything into your mouth.  Know whether or not you need emergency care.  Stay with you until you are better.  General instructions  Contact your doctor each time you have a seizure.  Avoid anything that gives you seizures.  Keep a seizure diary. Write down: ? What you think caused each seizure. ? What you remember about each seizure.  Keep all follow-up visits as told by your doctor. This is important. Contact a doctor if:   You have another seizure.  You have seizures more often.  There is any change in what happens during your seizures.  You keep having seizures with treatment.  You have symptoms of being sick or having an infection. Get help right away if:  You have a seizure that: ? Lasts longer than 5 minutes. ? Is different than seizures you had before. ? Makes it harder to breathe. ? Happens after you hurt your head.  You have any of these symptoms after a seizure: ? Not being able to speak. ? Not being able to use a part of your body. ? Confusion. ? A bad headache.  You have two or more seizures in a row.  You do not wake up right after a seizure.  You get hurt during a seizure. These symptoms may be an emergency. Do not wait to see if the symptoms will go away. Get medical help right away. Call your local emergency services (911 in the [place] Do not drive yourself to the hospital. Summary  Seizures usually last from 30 seconds to 2 minutes. Usually, they are not harmful unless they last a long time.  Do not eat or drink anything that may keep your medicine from working, such as alcohol.  Teach friends and family what to do when you have a seizure.  Contact your doctor each time you have a seizure. This information is not intended to replace advice given to you by your health care provider. Make sure you discuss any questions you have with your health care provider. Document Released: 09/11/2007 Document Revised: 06/12/2018 Document Reviewed: 06/12/2018 Elsevier Patient Education  [place].

## 2019-01-21 NOTE — Progress Notes (Signed)
PATIENT: Nathan [name] DOB: January 27, 1961  REASON FOR VISIT: follow up HISTORY FROM: patient  Chief Complaint  Patient presents with  . Follow-up    Room 2, seizure f/u. "Everything is the same.Speech is slurred more than normal"     HISTORY OF PRESENT ILLNESS: Today 01/21/19 Nathan [name] is a 58 y.o. male here today for follow up.  He presents today with Nathan [name], caregiver.  She reports that overall he is doing very well.  Unfortunately, he did have 1 breakthrough seizure in September.  Valproic acid levels were normal in the ER.  No medication changes were made at that time.  No additional seizures that they have noted.  He does continue to have trouble with drooling and slurred speech.  This is not new.  Some days are worse than others.  She reports that after taking morning medications he becomes very sleepy.  Otherwise he is doing well.  HISTORY: (copied from Nathan [name] note on 09/21/2018)  02/09/2018 Dr. [id number] R Simpsonis a 58 year old maleseen in request byher primary care physician Dr. Lauretta Chester evaluation of seizure, initial evaluation was on February 10, 2018. He is accompanied by his group home manager Nathan [name] at today's visit. He has lived in current group home for 3 years,  I have reviewed and summarized the referring note from the referring physician.He had a past medical history of hyperlipidemia, schizophrenia, HIV, on polypharmacy treatment.  He had twowitnessed seizures, the first 1 was in 2018, he suddenly fell out of the chair, whole body shaking, confused afterwards, lasting for few minutes, most recent one was on November 26, 2017, was taken to the emergency room, she was noted to have sudden onset rhythmic body jerking, patient was unable to provide detailed history, he denies any prodrome, has no retraction of the event,  He was already taking Depakote 250 mg 2 tablets in the morning, 4 tablets at night, then Keppra 500 mg twice  daily was started since November 27, 2017, he tolerated the medication well, there was no significant side effect noted.  Reviewed laboratory evaluation at emergency room, Depakote level was less than 10, suggest noncompliance, UDS was positive for benzodiazepine, CMP showed elevated creatinine 2.1, hemoglobin mildly decreased 12.2, CD4 count was 750, HIV virus load was not detected  Nathan [name] also reported that over the past few months, he had intermittent catching while walking, sometimes he is very sleepy after taking his medications, lack of coordination,  Update August 06, 2018 SS: Takingvalproic acid 250 mg (Depakene), 2 tablets in the morning, 4 tablets at bedtime, keppra 500 mg twice daily, November 2019 abnormal EEG with mild background slowing, but no evidence of epileptic form discharges  April 17 2018, hospital admission for AMS, lethargic after 2-day manic-like state, CT scan showed no abnormalities,admitted for acute encephalopathy,unsure if related to seizure, post-ictal state versus medication, no changes made to medications by psych, history of HIV, Schizophrenia, bipolar disorder,valproic acid level was 55,no seizures observed during hospitalization  May 02 2018 ED admission for AMS, requiring more assistance with walking, leaning, worsening dysarthria, expressive aphasia, evaluation was unremarkable, was discharged, depakote level WNL 60, ammonia slightly high 47 (9-35),  I did a video visit with NathanBarton, group home manager, Nathan [name]. She reports he has been doing great since his hospitalization in January 2020. She denies any seizure activity. It is felt that he probably had a seizure in January 2020, prompting hospitalization?She reports his walking is better, he continues to drag his feet. He  has not had any falls.He has a good appetite, cognition/mentation is at baseline. There has not been any issues. He has been compliant with medications.  Update September 21, 2018 SS: Nathan [name] says he is doing great, has not had recurrent seizure, his walking has remained the same, he still drags his feet, he continues taking Keppra 500 mg twice daily,Valproic acid (Depakene) 250 mg capsule, 2 capsules in the morning, 4 capsules at bedtime, no new problems or concerns.    REVIEW OF SYSTEMS: Out of a complete 14 system review of symptoms, the patient complains only of the following symptoms, memory loss, dizziness, seizure, speech difficulty, weakness, facial drooping and all other reviewed systems are negative.  ALLERGIES: No Known Allergies  HOME MEDICATIONS: Outpatient Medications Prior to Visit  Medication Sig Dispense Refill  . aspirin 81 MG chewable tablet Chew 81 mg by mouth daily.    Marland Kitchen atorvastatin (LIPITOR) 20 MG tablet Take 20 mg by mouth daily at 6 PM.    . benztropine (COGENTIN) 2 MG tablet Take 2 mg by mouth 2 (two) times daily.     . bictegravir-emtricitabine-tenofovir AF (BIKTARVY) 50-200-25 MG TABS tablet Take 1 tablet by mouth daily. 30 tablet 11  . cetirizine (ZYRTEC) 10 MG tablet TAKE 1 TABLET ONCE DAILY. 30 tablet 3  . clozapine (FAZACLO) 100 MG disintegrating tablet Take 50-100 mg by mouth See admin instructions. 100mg  in the morning and 50mg  at bedtime    . famotidine (PEPCID) 40 MG tablet Take 40 mg by mouth daily.    . folic acid (FOLVITE) 1 MG tablet Take 1 mg by mouth daily.    Marland Kitchen gabapentin (NEURONTIN) 400 MG capsule Take 400 mg by mouth 2 (two) times daily.     . haloperidol (HALDOL) 5 MG tablet Take 2 tablets (10 mg total) by mouth daily with supper. (Patient taking differently: Take 5 mg by mouth daily with supper. )    . metFORMIN (GLUCOPHAGE-XR) 500 MG 24 hr tablet Take 250 mg by mouth 2 (two) times daily.     . polyethylene glycol (MIRALAX / GLYCOLAX) packet Take 17 g by mouth daily. (Patient taking differently: Take 17 g by mouth daily as needed for mild constipation. ) 30 each 0  . senna (SENOKOT) 8.6 MG tablet Take 2 tablets by  mouth 2 (two) times daily.    Marland Kitchen valproic acid (DEPAKENE) 250 MG capsule Take 2 capsules every morning then take 4 capsules at bedtime (Patient taking differently: Take 500-1,000 mg by mouth See admin instructions. Take 2 capsules (500mg ) every morning then take 4 capsules (1000mg ) at bedtime) 180 capsule 11  . levETIRAcetam (KEPPRA) 500 MG tablet Take 1 tablet (500 mg total) by mouth 2 (two) times daily. 180 tablet 4  . gabapentin (NEURONTIN) 300 MG capsule Take 300 mg by mouth every morning. Take with the 400mg  tablet to equal a total of 700mg  in the mornings.     No facility-administered medications prior to visit.     PAST MEDICAL HISTORY: Past Medical History:  Diagnosis Date  . Anemia   . Anemia   . Anxiety   . Bipolar 1 disorder ([place])   . Depression, major   . HIV (human immunodeficiency virus infection) ([place])   . Hyperglycemia 08/06/2017  . Hypernatremia   . Kidney failure   . Lithium toxicity   . MR (mental retardation)   . Schizoaffective disorder ([place]) 08/06/2017  . Schizophrenia ([place])   . Seizures ([place])     PAST SURGICAL  HISTORY: Past Surgical History:  Procedure Laterality Date  . BIOPSY N/A 04/12/2014   Procedure: BIOPSY;  Surgeon: Danie Binder, MD;  Location: [facility];  Service: Endoscopy;  Laterality: N/A;  Gastric  . COLONOSCOPY WITH PROPOFOL N/A 04/12/2014   SLF:  1. one colon polyp removed 2. The left colon is extremely redundant 3. Small internal hemorrhoids  . ESOPHAGOGASTRODUODENOSCOPY (EGD) WITH PROPOFOL N/A 04/12/2014   SLF: Stricture at the gastroesophageal junction 2. small polyp  in teh gastric body 3. Mild Non-erosive gastritis.   Marland Kitchen NOSE SURGERY  1968   unsure what type of surgery. something to help him breathe better  . POLYPECTOMY N/A 04/12/2014   Procedure: POLYPECTOMY;  Surgeon: Danie Binder, MD;  Location: [facility];  Service: Endoscopy;  Laterality: N/A;  Cecal  . SAVORY DILATION N/A 04/12/2014   Procedure: SAVORY DILATION;  Surgeon: Danie Binder, MD;   Location: [facility];  Service: Endoscopy;  Laterality: N/A;  12.8/ 14/15/16  . SKIN BIOPSY      FAMILY HISTORY: Family History  Problem Relation Age of Onset  . Other Mother        unknown medical history  . Other Father        unknown medical history  . Colon cancer Neg Hx     SOCIAL HISTORY: Social History   Socioeconomic History  . Marital status: Single    Spouse name: Not on file  . Number of children: 0  . Years of education: 9th grade  . Highest education level: Not on file  Occupational History  . Occupation: Disabled  Social Needs  . Financial resource strain: Not on file  . Food insecurity    Worry: Not on file    Inability: Not on file  . Transportation needs    Medical: Not on file    Non-medical: Not on file  Tobacco Use  . Smoking status: Never Smoker  . Smokeless tobacco: Never Used  Substance and Sexual Activity  . Alcohol use: No    Alcohol/week: 0.0 standard drinks  . Drug use: No  . Sexual activity: Never    Birth control/protection: Abstinence    Comment: DECLINED CONDOMS  Lifestyle  . Physical activity    Days per week: Not on file    Minutes per session: Not on file  . Stress: Not on file  Relationships  . Social Herbalist on phone: Not on file    Gets together: Not on file    Attends religious service: Not on file    Active member of club or organization: Not on file    Attends meetings of clubs or organizations: Not on file    Relationship status: Not on file  . Intimate partner violence    Fear of current or ex partner: Not on file    Emotionally abused: Not on file    Physically abused: Not on file    Forced sexual activity: Not on file  Other Topics Concern  . Not on file  Social History Narrative   Lives in group home.   Right-handed.   2-3 sodas per day.      PHYSICAL EXAM  Vitals:   01/21/19 1139  BP: 120/80  Pulse: 83  Temp: 97.9 F (36.6 C)  Weight: 189 lb 12.8 oz (86.1 kg)  Height: 5\' 9"  (1.753 m)    Body mass index is 28.03 kg/m.  Generalized: Well developed, in no acute distress  Cardiology: normal rate and  rhythm, no murmur noted Neurological examination  Mentation: Alert oriented to time, place, history taking. Follows all commands speech and language slurred (baseline) Cranial nerve II-XII: Pupils were equal round reactive to light. Extraocular movements were full, visual field were full on confrontational test. Facial sensation and strength were normal. Uvula tongue midline. Head turning and shoulder shrug  were normal and symmetric. Motor: The motor testing reveals 5 over 5 strength of all 4 extremities. Good symmetric motor tone is noted throughout.  Sensory: Sensory testing is intact to soft touch on all 4 extremities. No evidence of extinction is noted.  Coordination: Cerebellar testing reveals good finger-nose-finger and heel-to-shin bilaterally.  Gait and station: Gait is wide, patient leans forward with walking   DIAGNOSTIC DATA (LABS, IMAGING, TESTING) - I reviewed patient records, labs, notes, testing and imaging myself where available.  No flowsheet data found.   Lab Results  Component Value Date   WBC 7.3 01/06/2019   HGB 11.8 (L) 01/06/2019   HCT 37.0 (L) 01/06/2019   MCV 93.7 01/06/2019   PLT 114 (L) 01/06/2019      Component Value Date/Time   NA 138 01/06/2019 1509   NA 143 09/24/2018 1413   K 3.7 01/06/2019 1509   CL 104 01/06/2019 1509   CO2 25 01/06/2019 1509   GLUCOSE 132 (H) 01/06/2019 1509   BUN 16 01/06/2019 1509   BUN 18 09/24/2018 1413   CREATININE 1.56 (H) 01/06/2019 1509   CREATININE 1.66 (H) 09/09/2018 1023   CALCIUM 9.0 01/06/2019 1509   CALCIUM 8.6 05/09/2011 0614   PROT 7.8 09/24/2018 1413   ALBUMIN 4.6 09/24/2018 1413   AST 16 09/24/2018 1413   ALT 17 09/24/2018 1413   ALKPHOS 58 09/24/2018 1413   BILITOT <0.2 09/24/2018 1413   GFRNONAA 48 (L) 01/06/2019 1509   GFRNONAA 38 (L) 07/21/2017 1120   GFRAA 55 (L) 01/06/2019 1509    GFRAA 44 (L) 07/21/2017 1120   Lab Results  Component Value Date   CHOL 226 (H) 03/13/2016   HDL 36 (L) 03/13/2016   LDLCALC 148 (H) 03/13/2016   TRIG 209 (H) 03/13/2016   CHOLHDL 6.3 (H) 03/13/2016   No results found for: HGBA1C Lab Results  Component Value Date   VITAMINB12 461 05/11/2011   Lab Results  Component Value Date   TSH 0.730 07/20/2016     ASSESSMENT AND PLAN 58 y.o. year old male  has a past medical history of Anemia, Anemia, Anxiety, Bipolar 1 disorder ([place]), Depression, major, HIV (human immunodeficiency virus infection) ([place]), Hyperglycemia (08/06/2017), Hypernatremia, Kidney failure, Lithium toxicity, MR (mental retardation), Schizoaffective disorder ([place]) (08/06/2017), Schizophrenia ([place]), and Seizures ([place]). here with     ICD-10-CM   1. Seizures ([place])  R56.9      Jojo presents today following breakthrough seizure in September.  Labs were unremarkable.  Creatinine clearence 61.  We will continue valproic acid 500 mg in the a.m. and 1000 mg in the p.m.  I will increase Keppra to 750 mg twice daily.  They will monitor closely for any seizure activity.  I would like for him to follow-up with Dr. Krista Blue in 4 to 6 weeks to reassess.  Initially Dontrez was very sleepy and hard to arouse.  Caregiver reports that this is normal for him, specifically after morning medications.  After stimulation from myself and caregiver, Marckus is much more alert.  They will continue to monitor this at home.  Caregiver verbalizes understanding and agreement with this plan.  She will  call with any new or worsening symptoms.  No orders of the defined types were placed in this encounter.    Meds ordered this encounter  Medications  . levETIRAcetam (KEPPRA) 750 MG tablet    Sig: Take 1 tablet (750 mg total) by mouth 2 (two) times daily.    Dispense:  60 tablet    Refill:  11    Order Specific Question:   Supervising Provider    Answer:   Melvenia Beam [id number]      I spent 15  minutes with the patient. 50% of this time was spent counseling and educating patient on plan of care and medications.    Debbora Presto, FNP-C 01/21/2019, 4:34 PM [facility] [address] [place], [place] [address] [phone]

## 2019-01-25 ENCOUNTER — Ambulatory Visit: Payer: Medicare Other | Admitting: Infectious Disease

## 2019-01-25 NOTE — Progress Notes (Signed)
I have reviewed and agreed above plan. 

## 2019-03-16 ENCOUNTER — Ambulatory Visit (INDEPENDENT_AMBULATORY_CARE_PROVIDER_SITE_OTHER): Payer: Medicare Other | Admitting: Neurology

## 2019-03-16 ENCOUNTER — Other Ambulatory Visit: Payer: Self-pay

## 2019-03-16 ENCOUNTER — Encounter: Payer: Self-pay | Admitting: Neurology

## 2019-03-16 VITALS — BP 133/78 | HR 71 | Temp 97.8°F | Ht 69.0 in | Wt 192.5 lb

## 2019-03-16 DIAGNOSIS — R569 Unspecified convulsions: Secondary | ICD-10-CM

## 2019-03-16 DIAGNOSIS — R269 Unspecified abnormalities of gait and mobility: Secondary | ICD-10-CM

## 2019-03-16 MED ORDER — LEVETIRACETAM 750 MG PO TABS: 750.0000 mg | ORAL_TABLET | Freq: Two times a day (BID) | ORAL | 4 refills | Status: DC

## 2019-03-16 MED ORDER — VALPROIC ACID 250 MG PO CAPS: ORAL_CAPSULE | ORAL | 4 refills | Status: DC

## 2019-03-16 NOTE — Progress Notes (Signed)
PATIENT: Nathan [name] DOB: [birth year]  REASON FOR VISIT: follow up HISTORY FROM: patient  Chief Complaint  Patient presents with  . Seizures    He is here with his caregiver from [facility]. His last seizure was on 01/13/2019.  He is doing well on increased medications.  No further events.      HISTORY OF PRESENT ILLNESS:  02/09/2018 Dr. [id number] R Simpsonis a 58 year old maleseen in request byher primary care physician Dr. Lauretta Chester evaluation of seizure, initial evaluation was on February 10, 2018. He is accompanied by his group home manager [name] at today's visit. He has lived in current group home for 3 years,  I have reviewed and summarized the referring note from the referring physician.He had a past medical history of hyperlipidemia, schizophrenia, HIV, on polypharmacy treatment.  He had twowitnessed seizures, the first 1 was in 2018, he suddenly fell out of the chair, whole body shaking, confused afterwards, lasting for few minutes, most recent one was on November 26, 2017, was taken to the emergency room, she was noted to have sudden onset rhythmic body jerking, patient was unable to provide detailed history, he denies any prodrome, has no retraction of the event,  He was already taking Depakote 250 mg 2 tablets in the morning, 4 tablets at night, then Keppra 500 mg twice daily was started since November 27, 2017, he tolerated the medication well, there was no significant side effect noted.  Reviewed laboratory evaluation at emergency room, Depakote level was less than 10, suggest noncompliance, UDS was positive for benzodiazepine, CMP showed elevated creatinine 2.1, hemoglobin mildly decreased 12.2, CD4 count was 750, HIV virus load was not detected  [name] also reported that over the past few months, he had intermittent catching while walking, sometimes he is very sleepy after taking his medications, lack of coordination,  Update August 06, 2018 SS: Takingvalproic acid 250 mg (Depakene), 2 tablets in the morning, 4 tablets at bedtime, keppra 500 mg twice daily, November 2019 abnormal EEG with mild background slowing, but no evidence of epileptic form discharges  April 17 2018, hospital admission for AMS, lethargic after 2-day manic-like state, CT scan showed no abnormalities,admitted for acute encephalopathy,unsure if related to seizure, post-ictal state versus medication, no changes made to medications by psych, history of HIV, Schizophrenia, bipolar disorder,valproic acid level was 55,no seizures observed during hospitalization  May 02 2018 ED admission for AMS, requiring more assistance with walking, leaning, worsening dysarthria, expressive aphasia, evaluation was unremarkable, was discharged, depakote level WNL 60, ammonia slightly high 47 (9-35),  I did a video visit with Mr.Evora, group home manager, [name]. She reports he has been doing great since his hospitalization in January 2020. She denies any seizure activity. It is felt that he probably had a seizure in January 2020, prompting hospitalization?She reports his walking is better, he continues to drag his feet. He has not had any falls.He has a good appetite, cognition/mentation is at baseline. There has not been any issues. He has been compliant with medications.  UPDATE Mar 16 2019: He had 1 break seizure on January 06, 2019, presented to the emergency room, he was taking Keppra 500 mg twice daily, Depakote 250 mg 2 tablets in the morning/4 tablets every night when he had a breakthrough seizure, Depakote level was 61, creatinine was 1.56  Since last visit, his Keppra was increased to 750 mg twice a day, overall he is tolerating well, there was no recurrent seizure, he had good  days and bad days, confused  In addition, caretaker [name] reported that he tends to bend his knee while ambulating  I personally reviewed CT head without contrast in  January 2020, there was no acute abnormality, mild generalized atrophy, mild supratentorium small vessel disease,   REVIEW OF SYSTEMS: Out of a complete 14 system review of symptoms, as above ALLERGIES: No Known Allergies  HOME MEDICATIONS: Outpatient Medications Prior to Visit  Medication Sig Dispense Refill  . aspirin 81 MG chewable tablet Chew 81 mg by mouth daily.    Marland Kitchen atorvastatin (LIPITOR) 20 MG tablet Take 20 mg by mouth daily at 6 PM.    . benztropine (COGENTIN) 2 MG tablet Take 2 mg by mouth 2 (two) times daily.     . bictegravir-emtricitabine-tenofovir AF (BIKTARVY) 50-200-25 MG TABS tablet Take 1 tablet by mouth daily. 30 tablet 11  . cetirizine (ZYRTEC) 10 MG tablet TAKE 1 TABLET ONCE DAILY. 30 tablet 3  . clozapine (FAZACLO) 100 MG disintegrating tablet 200mg  in the morning and 100mg  at bedtime    . famotidine (PEPCID) 40 MG tablet Take 40 mg by mouth daily.    . folic acid (FOLVITE) 1 MG tablet Take 1 mg by mouth daily.    Marland Kitchen gabapentin (NEURONTIN) 400 MG capsule Take 400 mg by mouth 2 (two) times daily.     . haloperidol (HALDOL) 5 MG tablet Take 2 tablets (10 mg total) by mouth daily with supper. (Patient taking differently: Take 5 mg by mouth daily with supper. )    . levETIRAcetam (KEPPRA) 750 MG tablet Take 1 tablet (750 mg total) by mouth 2 (two) times daily. 60 tablet 11  . metFORMIN (GLUCOPHAGE-XR) 500 MG 24 hr tablet Take 250 mg by mouth 2 (two) times daily.     . polyethylene glycol (MIRALAX / GLYCOLAX) packet Take 17 g by mouth daily. (Patient taking differently: Take 17 g by mouth daily as needed for mild constipation. ) 30 each 0  . senna (SENOKOT) 8.6 MG tablet Take 2 tablets by mouth 2 (two) times daily.    Marland Kitchen valproic acid (DEPAKENE) 250 MG capsule Take 2 capsules every morning then take 4 capsules at bedtime (Patient taking differently: Take 500-1,000 mg by mouth See admin instructions. Take 2 capsules (500mg ) every morning then take 4 capsules (1000mg ) at bedtime)  180 capsule 11   No facility-administered medications prior to visit.     PAST MEDICAL HISTORY: Past Medical History:  Diagnosis Date  . Anemia   . Anemia   . Anxiety   . Bipolar 1 disorder ([place])   . Depression, major   . HIV (human immunodeficiency virus infection) ([place])   . Hyperglycemia 08/06/2017  . Hypernatremia   . Kidney failure   . Lithium toxicity   . MR (mental retardation)   . Schizoaffective disorder ([place]) 08/06/2017  . Schizophrenia ([place])   . Seizures ([place])     PAST SURGICAL HISTORY: Past Surgical History:  Procedure Laterality Date  . BIOPSY N/A 04/12/2014   Procedure: BIOPSY;  Surgeon: Danie Binder, MD;  Location: [facility];  Service: Endoscopy;  Laterality: N/A;  Gastric  . COLONOSCOPY WITH PROPOFOL N/A 04/12/2014   SLF:  1. one colon polyp removed 2. The left colon is extremely redundant 3. Small internal hemorrhoids  . ESOPHAGOGASTRODUODENOSCOPY (EGD) WITH PROPOFOL N/A 04/12/2014   SLF: Stricture at the gastroesophageal junction 2. small polyp  in teh gastric body 3. Mild Non-erosive gastritis.   Marland Kitchen [place]  unsure what type of surgery. something to help him breathe better  . POLYPECTOMY N/A 04/12/2014   Procedure: POLYPECTOMY;  Surgeon: Danie Binder, MD;  Location: [facility];  Service: Endoscopy;  Laterality: N/A;  Cecal  . SAVORY DILATION N/A 04/12/2014   Procedure: SAVORY DILATION;  Surgeon: Danie Binder, MD;  Location: [facility];  Service: Endoscopy;  Laterality: N/A;  12.8/ 14/15/16  . SKIN BIOPSY      FAMILY HISTORY: Family History  Problem Relation Age of Onset  . Other Mother        unknown medical history  . Other Father        unknown medical history  . Colon cancer Neg Hx     SOCIAL HISTORY: Social History   Socioeconomic History  . Marital status: Single    Spouse name: Not on file  . Number of children: 0  . Years of education: 9th grade  . Highest education level: Not on file  Occupational History  . Occupation: Disabled  Social  Needs  . Financial resource strain: Not on file  . Food insecurity    Worry: Not on file    Inability: Not on file  . Transportation needs    Medical: Not on file    Non-medical: Not on file  Tobacco Use  . Smoking status: Never Smoker  . Smokeless tobacco: Never Used  Substance and Sexual Activity  . Alcohol use: No    Alcohol/week: 0.0 standard drinks  . Drug use: No  . Sexual activity: Never    Birth control/protection: Abstinence    Comment: DECLINED CONDOMS  Lifestyle  . Physical activity    Days per week: Not on file    Minutes per session: Not on file  . Stress: Not on file  Relationships  . Social Herbalist on phone: Not on file    Gets together: Not on file    Attends religious service: Not on file    Active member of club or organization: Not on file    Attends meetings of clubs or organizations: Not on file    Relationship status: Not on file  . Intimate partner violence    Fear of current or ex partner: Not on file    Emotionally abused: Not on file    Physically abused: Not on file    Forced sexual activity: Not on file  Other Topics Concern  . Not on file  Social History Narrative   Lives in group home.   Right-handed.   2-3 sodas per day.      PHYSICAL EXAM  Vitals:   03/16/19 1302  BP: 133/78  Pulse: 71  Temp: 97.8 F (36.6 C)  Weight: 192 lb 8 oz (87.3 kg)  Height: 5\' 9"  (1.753 m)   Body mass index is 28.43 kg/m.  Generalized: Well developed, in no acute distress  Cardiology: normal rate and rhythm, no murmur noted Neurological examination  Mentation: Alert oriented to time, place, history taking. Follows all commands, mild dysarthria Cranial nerve II-XII: Pupils were equal round reactive to light. Extraocular movements were full, visual field were full on confrontational test. Facial sensation and strength were normal. Uvula tongue midline. Head turning and shoulder shrug  were normal and symmetric. Motor: The motor testing  reveals 5 over 5 strength of all 4 extremities. Good symmetric motor tone is noted throughout.  Sensory: Sensory testing is intact to soft touch on all 4 extremities. No evidence of extinction is noted.  Coordination: Cerebellar testing reveals good finger-nose-finger and heel-to-shin bilaterally.  Gait and station: Gait is wide, patient leans forward with knee flexion while walking   DIAGNOSTIC DATA (LABS, IMAGING, TESTING) - I reviewed patient records, labs, notes, testing and imaging myself where available.  No flowsheet data found.   Lab Results  Component Value Date   WBC 7.3 01/06/2019   HGB 11.8 (L) 01/06/2019   HCT 37.0 (L) 01/06/2019   MCV 93.7 01/06/2019   PLT 114 (L) 01/06/2019      Component Value Date/Time   NA 138 01/06/2019 1509   NA 143 09/24/2018 1413   K 3.7 01/06/2019 1509   CL 104 01/06/2019 1509   CO2 25 01/06/2019 1509   GLUCOSE 132 (H) 01/06/2019 1509   BUN 16 01/06/2019 1509   BUN 18 09/24/2018 1413   CREATININE 1.56 (H) 01/06/2019 1509   CREATININE 1.66 (H) 09/09/2018 1023   CALCIUM 9.0 01/06/2019 1509   CALCIUM 8.6 05/09/2011 0614   PROT 7.8 09/24/2018 1413   ALBUMIN 4.6 09/24/2018 1413   AST 16 09/24/2018 1413   ALT 17 09/24/2018 1413   ALKPHOS 58 09/24/2018 1413   BILITOT <0.2 09/24/2018 1413   GFRNONAA 48 (L) 01/06/2019 1509   GFRNONAA 38 (L) 07/21/2017 1120   GFRAA 55 (L) 01/06/2019 1509   GFRAA 44 (L) 07/21/2017 1120   Lab Results  Component Value Date   CHOL 226 (H) 03/13/2016   HDL 36 (L) 03/13/2016   LDLCALC 148 (H) 03/13/2016   TRIG 209 (H) 03/13/2016   CHOLHDL 6.3 (H) 03/13/2016   No results found for: HGBA1C Lab Results  Component Value Date   VITAMINB12 461 05/11/2011   Lab Results  Component Value Date   TSH 0.730 07/20/2016     ASSESSMENT AND PLAN Epilepsy  Most recurrent seizure was on January 06, 2019,  Keep current dose of Depakote DR 250 mg 2 capsules in the morning/4 capsules at nighttime, Keppra 750 mg  twice a day Bipolar disorder HIV positive Gait abnormalities  Likely due to his long-term neurotrophic medications, medication side effect, deconditioning  Marcial Pacas, M.D. Ph.D.  [facility] [place], [place] [address] Phone: [phone] Fax:      [phone]

## 2019-03-23 ENCOUNTER — Ambulatory Visit: Payer: Medicare Other | Admitting: Neurology

## 2019-03-31 ENCOUNTER — Emergency Department (HOSPITAL_COMMUNITY)
Admission: EM | Admit: 2019-03-31 | Discharge: 2019-04-03 | Disposition: A | Discharge status: Home / Self Care | Payer: Medicare Other | Attending: Emergency Medicine | Admitting: Emergency Medicine

## 2019-03-31 ENCOUNTER — Encounter (HOSPITAL_COMMUNITY): Payer: Self-pay | Admitting: Emergency Medicine

## 2019-03-31 ENCOUNTER — Other Ambulatory Visit: Payer: Self-pay

## 2019-03-31 DIAGNOSIS — R45850 Homicidal ideations: Secondary | ICD-10-CM | POA: Diagnosis not present

## 2019-03-31 DIAGNOSIS — Z79899 Other long term (current) drug therapy: Secondary | ICD-10-CM | POA: Insufficient documentation

## 2019-03-31 DIAGNOSIS — N183 Chronic kidney disease, stage 3 unspecified: Secondary | ICD-10-CM | POA: Insufficient documentation

## 2019-03-31 DIAGNOSIS — Z7982 Long term (current) use of aspirin: Secondary | ICD-10-CM | POA: Insufficient documentation

## 2019-03-31 DIAGNOSIS — B20 Human immunodeficiency virus [HIV] disease: Secondary | ICD-10-CM | POA: Diagnosis not present

## 2019-03-31 DIAGNOSIS — Z20828 Contact with and (suspected) exposure to other viral communicable diseases: Secondary | ICD-10-CM | POA: Diagnosis not present

## 2019-03-31 DIAGNOSIS — F79 Unspecified intellectual disabilities: Secondary | ICD-10-CM | POA: Insufficient documentation

## 2019-03-31 DIAGNOSIS — F250 Schizoaffective disorder, bipolar type: Secondary | ICD-10-CM | POA: Insufficient documentation

## 2019-03-31 DIAGNOSIS — Z046 Encounter for general psychiatric examination, requested by authority: Secondary | ICD-10-CM | POA: Diagnosis present

## 2019-03-31 DIAGNOSIS — F209 Schizophrenia, unspecified: Secondary | ICD-10-CM

## 2019-03-31 LAB — CBC WITH DIFFERENTIAL/PLATELET
Abs Immature Granulocytes: 0.02 10*3/uL (ref 0.00–0.07)
Basophils Absolute: 0 10*3/uL (ref 0.0–0.1)
Basophils Relative: 0 %
Eosinophils Absolute: 0.2 10*3/uL (ref 0.0–0.5)
Eosinophils Relative: 2 %
HCT: 40.7 % (ref 39.0–52.0)
Hemoglobin: 13.4 g/dL (ref 13.0–17.0)
Immature Granulocytes: 0 %
Lymphocytes Relative: 25 %
Lymphs Abs: 2.8 10*3/uL (ref 0.7–4.0)
MCH: 29.6 pg (ref 26.0–34.0)
MCHC: 32.9 g/dL (ref 30.0–36.0)
MCV: 89.8 fL (ref 80.0–100.0)
Monocytes Absolute: 1.3 10*3/uL — ABNORMAL HIGH (ref 0.1–1.0)
Monocytes Relative: 11 %
Neutro Abs: 6.9 10*3/uL (ref 1.7–7.7)
Neutrophils Relative %: 62 %
Platelets: 130 10*3/uL — ABNORMAL LOW (ref 150–400)
RBC: 4.53 MIL/uL (ref 4.22–5.81)
RDW: 13.9 % (ref 11.5–15.5)
WBC: 11.2 10*3/uL — ABNORMAL HIGH (ref 4.0–10.5)
nRBC: 0 % (ref 0.0–0.2)

## 2019-03-31 LAB — CBC
HCT: 40.3 % (ref 39.0–52.0)
Hemoglobin: 13.2 g/dL (ref 13.0–17.0)
MCH: 29.2 pg (ref 26.0–34.0)
MCHC: 32.8 g/dL (ref 30.0–36.0)
MCV: 89.2 fL (ref 80.0–100.0)
Platelets: 130 10*3/uL — ABNORMAL LOW (ref 150–400)
RBC: 4.52 MIL/uL (ref 4.22–5.81)
RDW: 13.8 % (ref 11.5–15.5)
WBC: 10.8 10*3/uL — ABNORMAL HIGH (ref 4.0–10.5)
nRBC: 0 % (ref 0.0–0.2)

## 2019-03-31 LAB — COMPREHENSIVE METABOLIC PANEL
ALT: 53 U/L — ABNORMAL HIGH (ref 0–44)
AST: 144 U/L — ABNORMAL HIGH (ref 15–41)
Albumin: 4.3 g/dL (ref 3.5–5.0)
Alkaline Phosphatase: 57 U/L (ref 38–126)
Anion gap: 13 (ref 5–15)
BUN: 25 mg/dL — ABNORMAL HIGH (ref 6–20)
CO2: 22 mmol/L (ref 22–32)
Calcium: 9.5 mg/dL (ref 8.9–10.3)
Chloride: 101 mmol/L (ref 98–111)
Creatinine, Ser: 2.21 mg/dL — ABNORMAL HIGH (ref 0.61–1.24)
GFR calc Af Amer: 36 mL/min — ABNORMAL LOW (ref >60)
GFR calc non Af Amer: 31 mL/min — ABNORMAL LOW (ref >60)
Glucose, Bld: 207 mg/dL — ABNORMAL HIGH (ref 70–99)
Potassium: 3.9 mmol/L (ref 3.5–5.1)
Sodium: 136 mmol/L (ref 135–145)
Total Bilirubin: 0.6 mg/dL (ref 0.3–1.2)
Total Protein: 8.2 g/dL — ABNORMAL HIGH (ref 6.5–8.1)

## 2019-03-31 LAB — ACETAMINOPHEN LEVEL: Acetaminophen (Tylenol), Serum: <10 ug/mL — ABNORMAL LOW (ref 10–30)

## 2019-03-31 LAB — ETHANOL: Alcohol, Ethyl (B): <10 mg/dL (ref <10)

## 2019-03-31 LAB — SALICYLATE LEVEL: Salicylate Lvl: <7 mg/dL — ABNORMAL LOW (ref 7.0–30.0)

## 2019-03-31 LAB — RESPIRATORY PANEL BY RT PCR (FLU A&B, COVID)
Influenza A by PCR: NEGATIVE
Influenza B by PCR: NEGATIVE
SARS Coronavirus 2 by RT PCR: NEGATIVE

## 2019-03-31 LAB — VALPROIC ACID LEVEL: Valproic Acid Lvl: 49 ug/mL — ABNORMAL LOW (ref 50.0–100.0)

## 2019-03-31 MED ORDER — LORATADINE 10 MG PO TABS
10.0000 mg | ORAL_TABLET | Freq: Every day | ORAL | Status: DC
  Administered 2019-03-31 – 2019-04-03 (×4): 10 mg via ORAL
  Filled 2019-03-31 (×4): qty 1

## 2019-03-31 MED ORDER — SENNA 8.6 MG PO TABS
2.0000 | ORAL_TABLET | Freq: Two times a day (BID) | ORAL | Status: DC
  Administered 2019-03-31 – 2019-04-03 (×6): 17.2 mg via ORAL
  Filled 2019-03-31 (×7): qty 2

## 2019-03-31 MED ORDER — FAMOTIDINE 20 MG PO TABS
40.0000 mg | ORAL_TABLET | Freq: Every day | ORAL | Status: DC
  Administered 2019-03-31 – 2019-04-03 (×4): 40 mg via ORAL
  Filled 2019-03-31 (×4): qty 2

## 2019-03-31 MED ORDER — VALPROIC ACID 250 MG PO CAPS: 500.0000 mg | ORAL_CAPSULE | Freq: Two times a day (BID) | ORAL | Status: DC

## 2019-03-31 MED ORDER — CLOZAPINE 100 MG PO TABS
100.0000 mg | ORAL_TABLET | Freq: Every day | ORAL | Status: DC
  Administered 2019-04-02: 100 mg via ORAL
  Filled 2019-03-31 (×4): qty 1

## 2019-03-31 MED ORDER — HALOPERIDOL 5 MG PO TABS
5.0000 mg | ORAL_TABLET | Freq: Every day | ORAL | Status: DC
  Administered 2019-04-01 – 2019-04-03 (×4): 5 mg via ORAL
  Filled 2019-03-31 (×4): qty 1

## 2019-03-31 MED ORDER — ZIPRASIDONE MESYLATE 20 MG IM SOLR
20.0000 mg | Freq: Once | INTRAMUSCULAR | Status: AC
  Administered 2019-03-31: 20 mg via INTRAMUSCULAR
  Filled 2019-03-31: qty 20

## 2019-03-31 MED ORDER — STERILE WATER FOR INJECTION IJ SOLN
INTRAMUSCULAR | Status: AC
  Administered 2019-03-31: 04:00:00 1.2 mL
  Filled 2019-03-31: qty 10

## 2019-03-31 MED ORDER — CLOZAPINE 100 MG PO TABS
200.0000 mg | ORAL_TABLET | Freq: Every day | ORAL | Status: DC
  Administered 2019-03-31: 200 mg via ORAL
  Filled 2019-03-31: qty 2

## 2019-03-31 MED ORDER — VALPROIC ACID 250 MG PO CAPS
500.0000 mg | ORAL_CAPSULE | Freq: Every day | ORAL | Status: DC
  Administered 2019-03-31 – 2019-04-03 (×4): 500 mg via ORAL
  Filled 2019-03-31 (×4): qty 2

## 2019-03-31 MED ORDER — CLOZAPINE 100 MG PO TBDP: 100.0000 mg | ORAL_TABLET | Freq: Two times a day (BID) | ORAL | Status: DC

## 2019-03-31 MED ORDER — GABAPENTIN 400 MG PO CAPS
400.0000 mg | ORAL_CAPSULE | Freq: Two times a day (BID) | ORAL | Status: DC
  Administered 2019-03-31: 10:00:00 400 mg via ORAL
  Filled 2019-03-31: qty 1

## 2019-03-31 MED ORDER — LEVETIRACETAM 750 MG PO TABS
750.0000 mg | ORAL_TABLET | Freq: Two times a day (BID) | ORAL | Status: DC
  Administered 2019-03-31 – 2019-04-03 (×7): 750 mg via ORAL
  Filled 2019-03-31 (×7): qty 1

## 2019-03-31 MED ORDER — CLOZAPINE 100 MG PO TABS
100.0000 mg | ORAL_TABLET | Freq: Once | ORAL | Status: DC
  Filled 2019-03-31: qty 1

## 2019-03-31 MED ORDER — VALPROIC ACID 250 MG PO CAPS
1000.0000 mg | ORAL_CAPSULE | Freq: Every day | ORAL | Status: DC
  Administered 2019-04-02 (×2): 1000 mg via ORAL
  Filled 2019-03-31 (×5): qty 4

## 2019-03-31 MED ORDER — FOLIC ACID 1 MG PO TABS
1.0000 mg | ORAL_TABLET | Freq: Every day | ORAL | Status: DC
  Administered 2019-03-31 – 2019-04-03 (×4): 1 mg via ORAL
  Filled 2019-03-31 (×4): qty 1

## 2019-03-31 MED ORDER — BENZTROPINE MESYLATE 1 MG PO TABS
2.0000 mg | ORAL_TABLET | Freq: Two times a day (BID) | ORAL | Status: DC
  Administered 2019-03-31 – 2019-04-03 (×6): 2 mg via ORAL
  Filled 2019-03-31 (×6): qty 2

## 2019-03-31 MED ORDER — ATORVASTATIN CALCIUM 10 MG PO TABS
20.0000 mg | ORAL_TABLET | Freq: Every day | ORAL | Status: DC
  Administered 2019-04-02 – 2019-04-03 (×3): 20 mg via ORAL
  Filled 2019-03-31 (×3): qty 2

## 2019-03-31 MED ORDER — HALOPERIDOL 5 MG PO TABS
5.0000 mg | ORAL_TABLET | Freq: Once | ORAL | Status: DC
  Filled 2019-03-31: qty 1

## 2019-03-31 MED ORDER — BICTEGRAVIR-EMTRICITAB-TENOFOV 50-200-25 MG PO TABS
1.0000 | ORAL_TABLET | Freq: Every day | ORAL | Status: DC
  Administered 2019-03-31 – 2019-04-03 (×4): 1 via ORAL
  Filled 2019-03-31 (×5): qty 1

## 2019-03-31 MED ORDER — ASPIRIN 81 MG PO CHEW
81.0000 mg | CHEWABLE_TABLET | Freq: Every day | ORAL | Status: DC
  Administered 2019-03-31 – 2019-04-03 (×4): 81 mg via ORAL
  Filled 2019-03-31 (×4): qty 1

## 2019-03-31 NOTE — Progress Notes (Signed)
Pt meets inpatient criteria per Talbot Grumbling, NP. Due to pt's I/DD diagnosis, pt will be referred to [place] has completed the state hospital diversion exception worksheet, which [facility] is currently reviewing.   Audree Camel, LCSW, [place] Disposition [facility]/TTS [phone]

## 2019-03-31 NOTE — BH Assessment (Signed)
Pt's DSS legal guardian, Linna Darner from [place], returned clinician's phone call re: pt's placement in the hospital. Clinician explained the recommendation is for pt to be hospitalized and that pt will be re-assessed by our psych team this morning. Cheri shared the following information:  * Pt may need a sitter in his room, as he does better with people * Pt is not violent under normal circumstances * Pt can NOT sign his own paperwork and should not be asked to do so, as he does not understand what he is signing; he has been deemed mentally incompetent * Pt needs to see people or he becomes anxious; do not shut his door, or this will be a trigger for him * Pt will be happy to hear that staff has talked to his SW and that she knows that he is in the hospital and what has happened, so it would be beneficial to remind him of this  Pt's DSS worker is working from home/the office in shifts, so she can be reached at different numbers at different times, so try both numbers and she will see you called and get back to you.  Cell: [phone] is no voicemail, but she'll see the missed call and return it Office: [phone] is voicemail, which will be sent to her via email

## 2019-03-31 NOTE — ED Notes (Signed)
UA still needed, Pt. Unable to give [place] a UA.

## 2019-03-31 NOTE — Progress Notes (Addendum)
Patient ID: Nathan [name], male   DOB: [birth year], 58 y.o.   MRN: [id number]   Medication recommendation: Per Dr. Dwyane Dee,  Haldol 5 mg po daily for hallucinations was recommended however, per chart review, patient is on 5 mg po daily at supper. Patient is also on Cogentin which should be continued. Ongoing monitoring of hallucinations is recommneded along with medication adjustments as necessary. An EKG should should be obtained as his last EKG per chart review was 1/12020.

## 2019-03-31 NOTE — ED Notes (Addendum)
Pt obtunded after taking clozapine. Able to arouse slightly after sternal rubbing. Strong radial pulse, airway intact. Notified EPD and spoke with pharmacist about medications sedative effects.

## 2019-03-31 NOTE — ED Notes (Signed)
Patient was given A snack and drink. A Regular Diet was ordered for Lunch.

## 2019-03-31 NOTE — ED Notes (Signed)
Patient refused to pee in the urinal

## 2019-03-31 NOTE — BH Assessment (Signed)
Per Talbot Grumbling, NP patient meets inpatient psychiatric care criteria. There are no appropriate beds at [facility]. TTS to seek placement. Patient referred to the following hospitals for review:   [facility] Details    [facility] Details    [facility] Details    [facility] Details    [facility] Details    [facility] Details    [facility] Details    [facility] Details    [facility] Details    [facility] Details    [facility] Details    [facility] Details    [facility] Details    [facility] Details    [facility] Details    [place] Details    [facility] Details    [facility] Details    [facility] Details    [facility] Details    [place]

## 2019-03-31 NOTE — ED Notes (Signed)
[employer] at bedside 
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
## 2019-03-31 NOTE — ED Notes (Signed)
Pt refusing to take any medications at this time. Pt sitting in bed having auditory/visual hallucinations.

## 2019-03-31 NOTE — ED Notes (Signed)
Pt sleeping. Off going RN states pt has been asleep since noon after getting medication. Resp even and non-labored. No distress noted. Sitter at bedside. Will continue to monitor.
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
## 2019-03-31 NOTE — BH Assessment (Signed)
Followed up on [facility] Diversion referral. Obtained completed [facility]/diversion authorization. The authorization number is: [id number] valid from 03/31/19 to 04/06/19. Faxed packed and authorization information to [facility]. Confirmed that packed was received with Jeneen Rinks. Also, completed phone referral. Patients information is now under review for acceptance to their wait list.

## 2019-03-31 NOTE — ED Notes (Signed)
TTS in progress 

## 2019-03-31 NOTE — ED Notes (Signed)
[facility] contacted this RN and notified RN that pt meets inpt criteria. Unable to accept pt at [facility] d/t lower IQ so placement is being worked on.

## 2019-03-31 NOTE — ED Notes (Addendum)
Pt changed into purple scrubs. Personnel belongings not inventoried, given to caregiver. Wanded and cleared by security.

## 2019-03-31 NOTE — ED Provider Notes (Signed)
  Physical Exam  BP 133/73 (BP Location: Left Arm)   Pulse 83   Temp 97.8 F (36.6 C) (Oral)   Resp 16   SpO2 97%   Physical Exam  ED Course/Procedures     Procedures  MDM  Patient had become unresponsive earlier today after getting clozapine.  Mental status improving now.  Still sleepy but does open eyes and do some speaking.  Will hold off on evening dose of same medicine and have psychiatry adjust medicines as needed       Davonna Belling, MD 03/31/19 2140

## 2019-03-31 NOTE — ED Notes (Signed)
[employer] leaving bedside
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
## 2019-04-01 DIAGNOSIS — R45850 Homicidal ideations: Secondary | ICD-10-CM | POA: Diagnosis not present

## 2019-04-01 LAB — RAPID URINE DRUG SCREEN, HOSP PERFORMED
Amphetamines: NOT DETECTED
Barbiturates: NOT DETECTED
Benzodiazepines: NOT DETECTED
Cocaine: NOT DETECTED
Opiates: NOT DETECTED
Tetrahydrocannabinol: NOT DETECTED

## 2019-04-01 NOTE — ED Notes (Addendum)
Pt up and walked to rest room. Pt refused to give urine sample. Will give pt a urinal for the next time to get sample.

## 2019-04-01 NOTE — Progress Notes (Signed)
CSW contacted [place] is under medical review at their facility.   Audree Camel, LCSW, [place] Disposition [facility]/TTS [phone]

## 2019-04-01 NOTE — ED Notes (Addendum)
Breakfast tray ordered 

## 2019-04-01 NOTE — ED Notes (Addendum)
Dr. Alvino Chapel to bedside to check on pt. Per md, hold the 2200 Clozapine 100mg  due to pt being so sleepy. Pt will wake up and answer questions when asked. Resp even and non-labored. No distress noted.  Will continue to monitor. Sitter at bedside.

## 2019-04-01 NOTE — BHH Counselor (Signed)
Re-assessment:   Patient re-assessed this morning. Patient appears to be in a good mood. Patient denied feeling scared and when asked if he feels safe patient expressed yes. Patient was pointing to something in the room and kept asking TTS assessor if she saw what he was pointing to.   Patient continues to be on the [facility] wait list

## 2019-04-01 NOTE — ED Notes (Signed)
Have been monitoring pts b/p throughout the night. It has been low but consistent. Woke pt up to talk to him. Pt answered questions and was cooperative. Pt drank cup of ice water and requested milk. Gave pt a milk and he drank that also. Rechecked b/p and it was 100/52. Will continue to monitor pt. Pt calm and cooperative at this time.

## 2019-04-01 NOTE — ED Notes (Signed)
Pt requested channel on tv to be changed; this RN performed same.

## 2019-04-02 DIAGNOSIS — R45850 Homicidal ideations: Secondary | ICD-10-CM | POA: Diagnosis not present

## 2019-04-02 MED ORDER — CLOZAPINE 25 MG PO TABS
50.0000 mg | ORAL_TABLET | Freq: Every day | ORAL | Status: DC
  Administered 2019-04-02: 50 mg via ORAL
  Filled 2019-04-02 (×2): qty 2
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
## 2019-04-02 NOTE — ED Notes (Signed)
Pt walking back and forth, stating "I need some exercise".  Remains cooperative.

## 2019-04-02 NOTE — ED Notes (Signed)
Requested BIKTARVY med from pharmacy.

## 2019-04-02 NOTE — ED Notes (Signed)
Pt continues to sit on side of bed and ambulate to bathroom from time.  Appears pleasant.

## 2019-04-02 NOTE — BH Assessment (Signed)
Clinician contacted [facility] and spoke to pt's nurse, Anthon RN, regarding pt's re-assessment. Pt's nurse states pt has been deep asleep since taking his medication and has been difficult to awaken. TTS will attempt again at a later time.

## 2019-04-02 NOTE — ED Notes (Signed)
Pt. Dropped medication on floor sennosides, Witnessed by NT Marla. Medication was wasted and reissued

## 2019-04-02 NOTE — BH Assessment (Addendum)
Clinician contacted pt via telephone for [facility] Re-Assessment to determine if pt continues to meet inpatient criteria. Clinician encouraged pt to share how he has been doing. Pt states he has been doing "good" and stated he has been dancing and exercising. Pt reports he has been eating well and that the food is "good." Pt also stated he has been sleeping "good." As of note, pt has been having a reaction to the medication (Clozaril) to help him sleep at night, in that it's working too well and pt is unable to wake up, respond to nursing staff, etc; this matter will be requested to be looked into by [facility] NP. Pt denies SI, HI, and AVH at this time.  This information was provided to [place], NP, who reviewed pt's chart and information and determined pt continues to meet inpatient criteria. Dr. Hampton Abbot, MD, was consulted regarding pt's medication and her recommendations are to lower pt's Clozaril to 50mg . This information was provided to pt's nurse, Darliss Ridgel, at [phone].

## 2019-04-03 DIAGNOSIS — R45850 Homicidal ideations: Secondary | ICD-10-CM | POA: Diagnosis not present
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
## 2019-04-03 NOTE — ED Notes (Signed)
IVC papers rescinded by Dr Roslynn Amble - Copy faxed to [place] sent to Medical Records - Original placed in folder for [employer].

## 2019-04-03 NOTE — ED Notes (Signed)
IVC/inpt Breakfast ordered

## 2019-04-03 NOTE — ED Notes (Signed)
ALL Belongings - 1 labeled belongings bag w/pt's dirty underwear - given to pt w/discharge paperwork. Group Home Caregiver arrived to pick up pt. Pt escorted to vehicle by [employer].

## 2019-04-03 NOTE — ED Notes (Addendum)
Ate breakfast. Re-TTS completed. Pt in bathroom.

## 2019-04-03 NOTE — Progress Notes (Signed)
CSW spoke with Dellie Catholic from pt's group home at [phone] to confirm pt's d/c. [name] reported she would be able to pick pt up within the next hour.  Darletta Moll MSW, [place] Worker Disposition  [facility] Ph: [phone] Fax: [phone]
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
## 2019-04-03 NOTE — ED Notes (Signed)
Sitting in chair in room

## 2019-04-03 NOTE — Discharge Instructions (Signed)
Recommend following up with your primary doctor regarding the symptoms you experience today.  If he had any thoughts of hurting herself, hurting others, please return to the emergency department for reassessment.

## 2019-04-03 NOTE — ED Notes (Addendum)
Per [place], pt's group home caregiver advised someone will be here to pick up pt in approx 4 minutes. Pt drinking soda given as requested. Pt changed into blue paper scrubs as pt's clothing was taken by group home caregiver.

## 2019-04-03 NOTE — ED Notes (Signed)
Pt ambulated to bathroom and back to room w/o difficulty multiple times.

## 2019-04-03 NOTE — ED Notes (Addendum)
Dr Sheppard Evens assessed pt - advised pt may be d/c'd back to group home. Pt denies SI/HI. Pt noted to be calm, cooperative.

## 2019-06-21 ENCOUNTER — Ambulatory Visit (HOSPITAL_COMMUNITY)
Admission: EM | Admit: 2019-06-21 | Discharge: 2019-06-21 | Disposition: A | Discharge status: Home / Self Care | Payer: Medicare Other

## 2019-06-21 ENCOUNTER — Ambulatory Visit (INDEPENDENT_AMBULATORY_CARE_PROVIDER_SITE_OTHER): Payer: Medicare Other

## 2019-06-21 ENCOUNTER — Other Ambulatory Visit: Payer: Self-pay

## 2019-06-21 ENCOUNTER — Encounter (HOSPITAL_COMMUNITY): Payer: Self-pay

## 2019-06-21 DIAGNOSIS — M25552 Pain in left hip: Secondary | ICD-10-CM | POA: Diagnosis not present

## 2019-06-21 MED ORDER — ACETAMINOPHEN 500 MG PO TABS: 500.0000 mg | ORAL_TABLET | Freq: Three times a day (TID) | ORAL | 0 refills | Status: AC | PRN

## 2019-06-21 MED ORDER — TIZANIDINE HCL 2 MG PO CAPS: 2.0000 mg | ORAL_CAPSULE | Freq: Three times a day (TID) | ORAL | 0 refills | Status: DC

## 2019-06-21 NOTE — ED Triage Notes (Signed)
Pt is here with right hip pain that started Friday, pt has used aspirin to relieve discomfort.

## 2019-06-21 NOTE — Discharge Instructions (Signed)
Nothing concerning on x-ray. Tylenol extra strength 1-2 every 8 hours as needed Low-dose muscle relaxant to use if this does not help. Follow-up with orthopedics as needed

## 2019-06-22 NOTE — ED Provider Notes (Signed)
[facility]    CSN: [id number] Arrival date & time: 06/21/19  1131      History   Chief Complaint Chief Complaint  Patient presents with  . Hip Pain    HPI Nathan [name] is a 59 y.o. male.   Patient is a 75-year-old male with past medical history of anemia, anxiety, bipolar, depression, HIV, hyperglycemia, hyponatremia, kidney failure, lithium toxicity, MR, schizoaffective disorder, schizophrenia and seizures.  He presents today with ongoing right hip pain.  This is been a chronic issue for him for a good bit of time.  This episode started approximate 4 days ago.  Describes as sharp and stabbing when it comes.  Worse with certain movements of the hip and mostly sitting and standing.  Denies any falls or injuries to the hip.  The pain does not radiate anywhere. No hx of same. No fever. Tylenol seems to help per caregiver.   Pt here with caregiver helping with hx.   ROS per HPI      Past Medical History:  Diagnosis Date  . Anemia   . Anemia   . Anxiety   . Bipolar 1 disorder ([place])   . Depression, major   . HIV (human immunodeficiency virus infection) ([place])   . Hyperglycemia 08/06/2017  . Hypernatremia   . Kidney failure   . Lithium toxicity   . MR (mental retardation)   . Schizoaffective disorder ([place]) 08/06/2017  . Schizophrenia ([place])   . Seizures [facility])     Patient Active Problem List   Diagnosis Date Noted  . Sepsis due to pneumonia ([place]) 02/16/2018  . Diarrhea 02/16/2018  . Gait abnormality 02/09/2018  . Hyperglycemia 08/06/2017  . Schizoaffective disorder ([place]) 08/06/2017  . Bipolar 1 disorder ([place])   . Schizophrenia ([place])   . Seizures ([place])   . Altered mental state 07/20/2016  . Aggressive behavior   . GERD (gastroesophageal reflux disease) 07/25/2014  . Acute encephalopathy 06/23/2014  . Hypernatremia 06/20/2014  . Dehydration 06/18/2014  . Lithium toxicity 06/18/2014  . ARF (acute renal failure) ([place]) 06/18/2014  . Acute urinary  retention 06/18/2014  . CAP (community acquired pneumonia) 06/18/2014  . Encounter for screening colonoscopy 03/25/2014  . Dysphagia, pharyngoesophageal phase 03/25/2014  . CKD (chronic kidney disease) stage 3, GFR 30-59 ml/min (H[facility]) 07/22/2012  . HIV disease ([place]) 06/19/2012  . Bipolar 1 disorder, mixed, moderate ([place]) 06/19/2012  . Hyponatremia 06/19/2012  . Intellectual disability 06/19/2012    Past Surgical History:  Procedure Laterality Date  . BIOPSY N/A 04/12/2014   Procedure: BIOPSY;  Surgeon: Danie Binder, MD;  Location: [facility];  Service: Endoscopy;  Laterality: N/A;  Gastric  . COLONOSCOPY WITH PROPOFOL N/A 04/12/2014   SLF:  1. one colon polyp removed 2. The left colon is extremely redundant 3. Small internal hemorrhoids  . ESOPHAGOGASTRODUODENOSCOPY (EGD) WITH PROPOFOL N/A 04/12/2014   SLF: Stricture at the gastroesophageal junction 2. small polyp  in teh gastric body 3. Mild Non-erosive gastritis.   Marland Kitchen NOSE SURGERY  1968   unsure what type of surgery. something to help him breathe better  . POLYPECTOMY N/A 04/12/2014   Procedure: POLYPECTOMY;  Surgeon: Danie Binder, MD;  Location: [facility];  Service: Endoscopy;  Laterality: N/A;  Cecal  . SAVORY DILATION N/A 04/12/2014   Procedure: SAVORY DILATION;  Surgeon: Danie Binder, MD;  Location: [facility];  Service: Endoscopy;  Laterality: N/A;  12.8/ 14/15/16  . SKIN BIOPSY  Home Medications    Prior to Admission medications   Medication Sig Start Date End Date Taking? Authorizing Provider  acetaminophen (TYLENOL) 500 MG tablet Take 1 tablet (500 mg total) by mouth every 8 (eight) hours as needed for moderate pain. 06/21/19   Loura Halt A, NP  aspirin 81 MG chewable tablet Chew 81 mg by mouth daily.    [provider]  atorvastatin (LIPITOR) 20 MG tablet Take 20 mg by mouth daily at 6 PM.    [provider]  benztropine (COGENTIN) 2 MG tablet Take 2 mg by mouth 2 (two) times daily.     [provider]  bictegravir-emtricitabine-tenofovir AF (BIKTARVY) 50-200-25 MG TABS tablet Take 1 tablet by mouth daily. 09/24/18   Truman Hayward, MD  cetirizine (ZYRTEC) 10 MG tablet TAKE 1 TABLET ONCE DAILY. 01/21/18   Forrest Moron, MD  cloZAPine (CLOZARIL) 100 MG tablet Take 300 mg by mouth at bedtime. 06/03/19   [provider]  clozapine (FAZACLO) 100 MG disintegrating tablet 200mg  in the morning and 100mg  at bedtime    [provider]  famotidine (PEPCID) 20 MG tablet Take 20 mg by mouth 2 (two) times daily. 06/10/19   [provider]  famotidine (PEPCID) 40 MG tablet Take 40 mg by mouth daily. 07/28/18   [provider]  ferrous sulfate 325 (65 FE) MG tablet Take by mouth.    [provider]  folic acid (FOLVITE) 1 MG tablet Take 1 mg by mouth daily.    [provider]  gabapentin (NEURONTIN) 300 MG capsule Take 300 mg by mouth daily. 06/10/19   [provider]  gabapentin (NEURONTIN) 400 MG capsule Take 400 mg by mouth 2 (two) times daily.     [provider]  haloperidol (HALDOL) 5 MG tablet Take 2 tablets (10 mg total) by mouth daily with supper. Patient taking differently: Take 5 mg by mouth daily with supper.  07/21/16   Rogue Bussing, MD  haloperidol decanoate (HALDOL DECANOATE) 50 MG/ML injection  06/05/19   [provider]  levETIRAcetam (KEPPRA) 750 MG tablet Take 1 tablet (750 mg total) by mouth 2 (two) times daily. 03/16/19   Marcial Pacas, MD  metFORMIN (GLUCOPHAGE) 500 MG tablet Take 500 mg by mouth daily. 06/10/19   [provider]  metFORMIN (GLUCOPHAGE-XR) 500 MG 24 hr tablet Take 250 mg by mouth 2 (two) times daily.     [provider]  polyethylene glycol (MIRALAX / GLYCOLAX) packet Take 17 g by mouth daily. Patient taking differently: Take 17 g by mouth daily as needed for mild constipation.  02/24/18   Thurnell Lose, MD  senna (SENOKOT) 8.6 MG tablet Take 2 tablets by mouth  2 (two) times daily.    [provider]  senna (SENOKOT) 8.6 MG tablet Take by mouth.    [provider]  tizanidine (ZANAFLEX) 2 MG capsule Take 1 capsule (2 mg total) by mouth 3 (three) times daily. 06/21/19   Loura Halt A, NP  valproic acid (DEPAKENE) 250 MG capsule Take 2 capsules every morning then take 4 capsules at bedtime 03/16/19   Marcial Pacas, MD    Family History Family History  Problem Relation Age of Onset  . Other Mother        unknown medical history  . Other Father        unknown medical history  . Colon cancer Neg Hx     Social History Social History  Tobacco Use  . Smoking status: Never Smoker  . Smokeless tobacco: Never Used  Substance Use Topics  . Alcohol use: No    Alcohol/week: 0.0 standard drinks  . Drug use: No     Allergies   Patient has no known allergies.   Review of Systems Review of Systems   Physical Exam Triage Vital Signs ED Triage Vitals  Enc Vitals Group     BP 06/21/19 1149 (!) 141/93     Pulse Rate 06/21/19 1149 84     Resp 06/21/19 1149 18     Temp 06/21/19 1149 (!) 97.1 F (36.2 C)     Temp Source 06/21/19 1149 Oral     SpO2 06/21/19 1149 98 %     Weight 06/21/19 1146 195 lb 3.2 oz (88.5 kg)     Height --      Head Circumference --      Peak Flow --      Pain Score 06/21/19 1145 10     Pain Loc --      Pain Edu? --      Excl. in [place]? --    No data found.  Updated Vital Signs BP (!) 141/93 (BP Location: Left Arm)   Pulse 84   Temp (!) 97.1 F (36.2 C) (Oral)   Resp 18   Wt 195 lb 3.2 oz (88.5 kg)   SpO2 98%   BMI 28.83 kg/m   Visual Acuity Right Eye Distance:   Left Eye Distance:   Bilateral Distance:    Right Eye Near:   Left Eye Near:    Bilateral Near:     Physical Exam Vitals and nursing note reviewed.  Constitutional:      Appearance: Normal appearance.  HENT:     Head: Normocephalic and atraumatic.     Nose: Nose normal.  Eyes:     Conjunctiva/sclera: Conjunctivae normal.   Pulmonary:     Effort: Pulmonary effort is normal.  Musculoskeletal:        General: Normal range of motion.     Cervical back: Normal range of motion.     Right hip: Bony tenderness present. No deformity, lacerations, tenderness or crepitus. Normal range of motion. Normal strength.       Legs:  Skin:    General: Skin is warm and dry.  Neurological:     Mental Status: He is alert.  Psychiatric:        Mood and Affect: Mood normal.      UC Treatments / Results  Labs (all labs ordered are listed, but only abnormal results are displayed) Labs Reviewed - No data to display  EKG   Radiology DG Hip Unilat With Pelvis 2-3 Views Right  Result Date: 06/21/2019 CLINICAL DATA:  Right hip pain for a couple of months. No known injury. EXAM: DG HIP (WITH OR WITHOUT PELVIS) 2-3V RIGHT COMPARISON:  CT scan of the abdomen and pelvis dated 02/18/2018 FINDINGS: There is no hip fracture or dislocation. No appreciable arthritic changes. Slight enthesophyte formation at the iliopsoas tendon insertion on the lesser trochanter. There is a similar appearance at the left hip. The patient does have bilateral pars defects at L5 as demonstrated on the prior CT scan of the abdomen and pelvis. IMPRESSION: No significant abnormality of the right hip. Bilateral pars defects at L5. Electronically Signed   By: Lorriane Shire M.D.   On: 06/21/2019 12:30    Procedures Procedures (including critical care time)  Medications Ordered in UC  Medications - No data to display  Initial Impression / Assessment and Plan / UC Course  I have reviewed the triage vital signs and the nursing notes.  Pertinent labs & imaging results that were available during my care of the patient were reviewed by me and considered in my medical decision making (see chart for details).     Hip pain-no acute or concerning on x-ray.  Most muscle strain He can continue Tylenol extra strength every 8 hours as needed. Low-dose muscle  relaxant to use if this does not help with the pain. Follow-up with orthopedics as needed Final Clinical Impressions(s) / UC Diagnoses   Final diagnoses:  Acute hip pain, left     Discharge Instructions     Nothing concerning on x-ray. Tylenol extra strength 1-2 every 8 hours as needed Low-dose muscle relaxant to use if this does not help. Follow-up with orthopedics as needed    ED Prescriptions    Medication Sig Dispense Auth. Provider   acetaminophen (TYLENOL) 500 MG tablet Take 1 tablet (500 mg total) by mouth every 8 (eight) hours as needed for moderate pain. 30 tablet Leesha Veno A, NP   tizanidine (ZANAFLEX) 2 MG capsule Take 1 capsule (2 mg total) by mouth 3 (three) times daily. 15 capsule Bonham Zingale A, NP     I have reviewed the PDMP during this encounter.   Orvan July, NP 06/22/19 1027

## 2019-09-14 ENCOUNTER — Ambulatory Visit: Payer: Medicare Other | Admitting: Neurology

## 2019-09-14 ENCOUNTER — Encounter: Payer: Self-pay | Admitting: Neurology

## 2019-09-14 NOTE — Progress Notes (Deleted)
PATIENT: Nathan [name] DOB: 1960-12-09  REASON FOR VISIT: follow up HISTORY FROM: patient  HISTORY OF PRESENT ILLNESS: Today 09/14/19  HISTORY  02/09/2018 Dr. [id number] Nathan [name] a 59 year old maleseen in request byher primary care physician Dr. Lauretta Chester evaluation of seizure, initial evaluation was on February 10, 2018. He is accompanied by his group home manager Nathan [name] at today's visit. He has lived in current group home for 3 years,  I have reviewed and summarized the referring note from the referring physician.He had a past medical history of hyperlipidemia, schizophrenia, HIV, on polypharmacy treatment.  He had twowitnessed seizures, the first 1 was in 2018, he suddenly fell out of the chair, whole body shaking, confused afterwards, lasting for few minutes, most recent one was on November 26, 2017, was taken to the emergency room, she was noted to have sudden onset rhythmic body jerking, patient was unable to provide detailed history, he denies any prodrome, has no retraction of the event,  He was already taking Depakote 250 mg 2 tablets in the morning, 4 tablets at night, then Keppra 500 mg twice daily was started since November 27, 2017, he tolerated the medication well, there was no significant side effect noted.  Reviewed laboratory evaluation at emergency room, Depakote level was less than 10, suggest noncompliance, UDS was positive for benzodiazepine, CMP showed elevated creatinine 2.1, hemoglobin mildly decreased 12.2, CD4 count was 750, HIV virus load was not detected  Nathan [name] also reported that over the past few months, he had intermittent catching while walking, sometimes he is very sleepy after taking his medications, lack of coordination,  Update August 06, 2018 SS: Takingvalproic acid 250 mg (Depakene), 2 tablets in the morning, 4 tablets at bedtime, keppra 500 mg twice daily, November 2019 abnormal EEG with mild background slowing, but no  evidence of epileptic form discharges  April 17 2018, hospital admission for AMS, lethargic after 2-day manic-like state, CT scan showed no abnormalities,admitted for acute encephalopathy,unsure if related to seizure, post-ictal state versus medication, no changes made to medications by psych, history of HIV, Schizophrenia, bipolar disorder,valproic acid level was 55,no seizures observed during hospitalization  May 02 2018 ED admission for AMS, requiring more assistance with walking, leaning, worsening dysarthria, expressive aphasia, evaluation was unremarkable, was discharged, depakote level WNL 60, ammonia slightly high 47 (9-35),  I did a video visit with [name], group home manager, Nathan [name]. She reports he has been doing great since his hospitalization in January 2020. She denies any seizure activity. It is felt that he probably had a seizure in January 2020, prompting hospitalization?She reports his walking is better, he continues to drag his feet. He has not had any falls.He has a good appetite, cognition/mentation is at baseline. There has not been any issues. He has been compliant with medications.  UPDATE Mar 16 2019: He had 1 break seizure on January 06, 2019, presented to the emergency room, he was taking Keppra 500 mg twice daily, Depakote 250 mg 2 tablets in the morning/4 tablets every night when he had a breakthrough seizure, Depakote level was 61, creatinine was 1.56  Since last visit, his Keppra was increased to 750 mg twice a day, overall he is tolerating well, there was no recurrent seizure, he had good days and bad days, confused  In addition, caretaker Nathan [name] reported that he tends to bend his knee while ambulating  I personally reviewed CT head without contrast in January 2020, there was no acute abnormality, mild generalized atrophy, mild supratentorium  small vessel disease,  Update September 14, 2019 SS:   REVIEW OF SYSTEMS: Out of a complete 14 system  review of symptoms, the patient complains only of the following symptoms, and all other reviewed systems are negative.  ALLERGIES: No Known Allergies  HOME MEDICATIONS: Outpatient Medications Prior to Visit  Medication Sig Dispense Refill  . acetaminophen (TYLENOL) 500 MG tablet Take 1 tablet (500 mg total) by mouth every 8 (eight) hours as needed for moderate pain. 30 tablet 0  . aspirin 81 MG chewable tablet Chew 81 mg by mouth daily.    Marland Kitchen atorvastatin (LIPITOR) 20 MG tablet Take 20 mg by mouth daily at 6 PM.    . benztropine (COGENTIN) 2 MG tablet Take 2 mg by mouth 2 (two) times daily.     . bictegravir-emtricitabine-tenofovir AF (BIKTARVY) 50-200-25 MG TABS tablet Take 1 tablet by mouth daily. 30 tablet 11  . cetirizine (ZYRTEC) 10 MG tablet TAKE 1 TABLET ONCE DAILY. 30 tablet 3  . cloZAPine (CLOZARIL) 100 MG tablet Take 300 mg by mouth at bedtime.    . clozapine (FAZACLO) 100 MG disintegrating tablet 200mg  in the morning and 100mg  at bedtime    . famotidine (PEPCID) 20 MG tablet Take 20 mg by mouth 2 (two) times daily.    . famotidine (PEPCID) 40 MG tablet Take 40 mg by mouth daily.    . ferrous sulfate 325 (65 FE) MG tablet Take by mouth.    . folic acid (FOLVITE) 1 MG tablet Take 1 mg by mouth daily.    Marland Kitchen gabapentin (NEURONTIN) 300 MG capsule Take 300 mg by mouth daily.    Marland Kitchen gabapentin (NEURONTIN) 400 MG capsule Take 400 mg by mouth 2 (two) times daily.     . haloperidol (HALDOL) 5 MG tablet Take 2 tablets (10 mg total) by mouth daily with supper. (Patient taking differently: Take 5 mg by mouth daily with supper. )    . haloperidol decanoate (HALDOL DECANOATE) 50 MG/ML injection     . levETIRAcetam (KEPPRA) 750 MG tablet Take 1 tablet (750 mg total) by mouth 2 (two) times daily. 180 tablet 4  . metFORMIN (GLUCOPHAGE) 500 MG tablet Take 500 mg by mouth daily.    . metFORMIN (GLUCOPHAGE-XR) 500 MG 24 hr tablet Take 250 mg by mouth 2 (two) times daily.     . polyethylene glycol  (MIRALAX / GLYCOLAX) packet Take 17 g by mouth daily. (Patient taking differently: Take 17 g by mouth daily as needed for mild constipation. ) 30 each 0  . senna (SENOKOT) 8.6 MG tablet Take 2 tablets by mouth 2 (two) times daily.    Marland Kitchen senna (SENOKOT) 8.6 MG tablet Take by mouth.    . tizanidine (ZANAFLEX) 2 MG capsule Take 1 capsule (2 mg total) by mouth 3 (three) times daily. 15 capsule 0  . valproic acid (DEPAKENE) 250 MG capsule Take 2 capsules every morning then take 4 capsules at bedtime 540 capsule 4   No facility-administered medications prior to visit.    PAST MEDICAL HISTORY: Past Medical History:  Diagnosis Date  . Anemia   . Anemia   . Anxiety   . Bipolar 1 disorder ([place])   . Depression, major   . HIV (human immunodeficiency virus infection) ([place])   . Hyperglycemia 08/06/2017  . Hypernatremia   . Kidney failure   . Lithium toxicity   . MR (mental retardation)   . Schizoaffective disorder ([place]) 08/06/2017  . Schizophrenia ([place])   . Seizures (  [place])     PAST SURGICAL HISTORY: Past Surgical History:  Procedure Laterality Date  . BIOPSY N/A 04/12/2014   Procedure: BIOPSY;  Surgeon: Danie Binder, MD;  Location: [facility];  Service: Endoscopy;  Laterality: N/A;  Gastric  . COLONOSCOPY WITH PROPOFOL N/A 04/12/2014   SLF:  1. one colon polyp removed 2. The left colon is extremely redundant 3. Small internal hemorrhoids  . ESOPHAGOGASTRODUODENOSCOPY (EGD) WITH PROPOFOL N/A 04/12/2014   SLF: Stricture at the gastroesophageal junction 2. small polyp  in teh gastric body 3. Mild Non-erosive gastritis.   Marland Kitchen NOSE SURGERY  1968   unsure what type of surgery. something to help him breathe better  . POLYPECTOMY N/A 04/12/2014   Procedure: POLYPECTOMY;  Surgeon: Danie Binder, MD;  Location: [facility];  Service: Endoscopy;  Laterality: N/A;  Cecal  . SAVORY DILATION N/A 04/12/2014   Procedure: SAVORY DILATION;  Surgeon: Danie Binder, MD;  Location: [facility];  Service: Endoscopy;  Laterality: N/A;   12.8/ 14/15/16  . SKIN BIOPSY      FAMILY HISTORY: Family History  Problem Relation Age of Onset  . Other Mother        unknown medical history  . Other Father        unknown medical history  . Colon cancer Neg Hx     SOCIAL HISTORY: Social History   Socioeconomic History  . Marital status: Single    Spouse name: Not on file  . Number of children: 0  . Years of education: 9th grade  . Highest education level: Not on file  Occupational History  . Occupation: Disabled  Tobacco Use  . Smoking status: Never Smoker  . Smokeless tobacco: Never Used  Substance and Sexual Activity  . Alcohol use: No    Alcohol/week: 0.0 standard drinks  . Drug use: No  . Sexual activity: Never    Birth control/protection: Abstinence    Comment: DECLINED CONDOMS  Other Topics Concern  . Not on file  Social History Narrative   Lives in group home.   Right-handed.   2-3 sodas per day.   Social Determinants of Health   Financial Resource Strain:   . Difficulty of Paying Living Expenses:   Food Insecurity:   . Worried About Charity fundraiser in the Last Year:   . Arboriculturist in the Last Year:   Transportation Needs:   . Film/video editor (Medical):   Marland Kitchen Lack of Transportation (Non-Medical):   Physical Activity:   . Days of Exercise per Week:   . Minutes of Exercise per Session:   Stress:   . Feeling of Stress :   Social Connections:   . Frequency of Communication with Friends and Family:   . Frequency of Social Gatherings with Friends and Family:   . Attends Religious Services:   . Active Member of Clubs or Organizations:   . Attends Archivist Meetings:   Marland Kitchen Marital Status:   Intimate Partner Violence:   . Fear of Current or Ex-Partner:   . Emotionally Abused:   Marland Kitchen Physically Abused:   . Sexually Abused:       PHYSICAL EXAM  There were no vitals filed for this visit. There is no height or weight on file to calculate BMI.  Generalized: Well developed,  in no acute distress   Neurological examination  Mentation: Alert oriented to time, place, history taking. Follows all commands speech and language fluent Cranial nerve II-XII: Pupils  were equal round reactive to light. Extraocular movements were full, visual field were full on confrontational test. Facial sensation and strength were normal. Uvula tongue midline. Head turning and shoulder shrug  were normal and symmetric. Motor: The motor testing reveals 5 over 5 strength of all 4 extremities. Good symmetric motor tone is noted throughout.  Sensory: Sensory testing is intact to soft touch on all 4 extremities. No evidence of extinction is noted.  Coordination: Cerebellar testing reveals good finger-nose-finger and heel-to-shin bilaterally.  Gait and station: Gait is normal. Tandem gait is normal. Romberg is negative. No drift is seen.  Reflexes: Deep tendon reflexes are symmetric and normal bilaterally.   DIAGNOSTIC DATA (LABS, IMAGING, TESTING) - I reviewed patient records, labs, notes, testing and imaging myself where available.  Lab Results  Component Value Date   WBC 11.2 (H) 03/31/2019   HGB 13.4 03/31/2019   HCT 40.7 03/31/2019   MCV 89.8 03/31/2019   PLT 130 (L) 03/31/2019      Component Value Date/Time   NA 136 03/31/2019 0146   NA 143 09/24/2018 1413   K 3.9 03/31/2019 0146   CL 101 03/31/2019 0146   CO2 22 03/31/2019 0146   GLUCOSE 207 (H) 03/31/2019 0146   BUN 25 (H) 03/31/2019 0146   BUN 18 09/24/2018 1413   CREATININE 2.21 (H) 03/31/2019 0146   CREATININE 1.66 (H) 09/09/2018 1023   CALCIUM 9.5 03/31/2019 0146   CALCIUM 8.6 05/09/2011 0614   PROT 8.2 (H) 03/31/2019 0146   PROT 7.8 09/24/2018 1413   ALBUMIN 4.3 03/31/2019 0146   ALBUMIN 4.6 09/24/2018 1413   AST 144 (H) 03/31/2019 0146   ALT 53 (H) 03/31/2019 0146   ALKPHOS 57 03/31/2019 0146   BILITOT 0.6 03/31/2019 0146   BILITOT <0.2 09/24/2018 1413   GFRNONAA 31 (L) 03/31/2019 0146   GFRNONAA 38 (L)  07/21/2017 1120   GFRAA 36 (L) 03/31/2019 0146   GFRAA 44 (L) 07/21/2017 1120   Lab Results  Component Value Date   CHOL 226 (H) 03/13/2016   HDL 36 (L) 03/13/2016   LDLCALC 148 (H) 03/13/2016   TRIG 209 (H) 03/13/2016   CHOLHDL 6.3 (H) 03/13/2016   No results found for: HGBA1C Lab Results  Component Value Date   VITAMINB12 461 05/11/2011   Lab Results  Component Value Date   TSH 0.730 07/20/2016      ASSESSMENT AND PLAN 59 y.o. year old male  has a past medical history of Anemia, Anemia, Anxiety, Bipolar 1 disorder ([place]), Depression, major, HIV (human immunodeficiency virus infection) ([place]), Hyperglycemia (08/06/2017), Hypernatremia, Kidney failure, Lithium toxicity, MR (mental retardation), Schizoaffective disorder ([place]) (08/06/2017), Schizophrenia ([place]), and Seizures ([place]). here with:  1.  Epilepsy -Most recent seizure January 06, 2019 -Continue Depakote DR 250 mg, 2 in the morning, 4 at nighttime -Continue Keppra 750 mg twice a day 2.  Bipolar disorder 3.  HIV positive 4.  Gait abnormalities -Likely due to long-term neurotrophic medications, medication side effect, deconditioning   I spent 15 minutes with the patient. 50% of this time was spent   Butler Denmark, [place], DNP 09/14/2019, 5:37 AM [facility] [address], [place], [place] [address] [phone]

## 2019-10-04 ENCOUNTER — Ambulatory Visit (INDEPENDENT_AMBULATORY_CARE_PROVIDER_SITE_OTHER): Payer: Medicare Other | Admitting: Neurology

## 2019-10-04 ENCOUNTER — Other Ambulatory Visit: Payer: Self-pay

## 2019-10-04 ENCOUNTER — Encounter: Payer: Self-pay | Admitting: Neurology

## 2019-10-04 VITALS — BP 130/70 | HR 90 | Ht 69.0 in | Wt 189.0 lb

## 2019-10-04 DIAGNOSIS — R569 Unspecified convulsions: Secondary | ICD-10-CM | POA: Diagnosis not present

## 2019-10-04 NOTE — Patient Instructions (Signed)
Continue current medications  Check blood work today  See you back in 6 months   

## 2019-10-04 NOTE — Progress Notes (Signed)
PATIENT: EULISES KIJOWSKI DOB: 10-30-1960  REASON FOR VISIT: follow up HISTORY FROM: patient  HISTORY OF PRESENT ILLNESS: Today 10/04/19  HISTORY  02/09/2018 Dr. [id number] R Simpsonis a 59 year old maleseen in request byher primary care physician Dr. Lauretta Chester evaluation of seizure, initial evaluation was on February 10, 2018. He is accompanied by his group home manager [name] at today's visit. He has lived in current group home for 3 years,  I have reviewed and summarized the referring note from the referring physician.He had a past medical history of hyperlipidemia, schizophrenia, HIV, on polypharmacy treatment.  He had twowitnessed seizures, the first 1 was in 2018, he suddenly fell out of the chair, whole body shaking, confused afterwards, lasting for few minutes, most recent one was on November 26, 2017, was taken to the emergency room, she was noted to have sudden onset rhythmic body jerking, patient was unable to provide detailed history, he denies any prodrome, has no retraction of the event,  He was already taking Depakote 250 mg 2 tablets in the morning, 4 tablets at night, then Keppra 500 mg twice daily was started since November 27, 2017, he tolerated the medication well, there was no significant side effect noted.  Reviewed laboratory evaluation at emergency room, Depakote level was less than 10, suggest noncompliance, UDS was positive for benzodiazepine, CMP showed elevated creatinine 2.1, hemoglobin mildly decreased 12.2, CD4 count was 750, HIV virus load was not detected  [name] also reported that over the past few months, he had intermittent catching while walking, sometimes he is very sleepy after taking his medications, lack of coordination,  Update August 06, 2018 SS: Takingvalproic acid 250 mg (Depakene), 2 tablets in the morning, 4 tablets at bedtime, keppra 500 mg twice daily, November 2019 abnormal EEG with mild background slowing, but no  evidence of epileptic form discharges  April 17 2018, hospital admission for AMS, lethargic after 2-day manic-like state, CT scan showed no abnormalities,admitted for acute encephalopathy,unsure if related to seizure, post-ictal state versus medication, no changes made to medications by psych, history of HIV, Schizophrenia, bipolar disorder,valproic acid level was 55,no seizures observed during hospitalization  May 02 2018 ED admission for AMS, requiring more assistance with walking, leaning, worsening dysarthria, expressive aphasia, evaluation was unremarkable, was discharged, depakote level WNL 60, ammonia slightly high 47 (9-35),  I did a video visit with Mr.Car, group home manager, [name]. She reports he has been doing great since his hospitalization in January 2020. She denies any seizure activity. It is felt that he probably had a seizure in January 2020, prompting hospitalization?She reports his walking is better, he continues to drag his feet. He has not had any falls.He has a good appetite, cognition/mentation is at baseline. There has not been any issues. He has been compliant with medications.  UPDATE Mar 16 2019: He had 1 break seizure on January 06, 2019, presented to the emergency room, he was taking Keppra 500 mg twice daily, Depakote 250 mg 2 tablets in the morning/4 tablets every night when he had a breakthrough seizure, Depakote level was 61, creatinine was 1.56  Since last visit, his Keppra was increased to 750 mg twice a day, overall he is tolerating well, there was no recurrent seizure, he had good days and bad days, confused  In addition, caretaker [name] reported that he tends to bend his knee while ambulating  I personally reviewed CT head without contrast in January 2020, there was no acute abnormality, mild generalized atrophy, mild supratentorium  small vessel disease,  Update October 04, 2019 SS: Here today for follow-up, accompanied by [name],  lives in group home.  No recurrent seizure, last was September 2020.  He remains on Keppra 750 mg twice daily, valproic acid (Depakene) 250 mg capsule, 2 in the morning, 4 at bedtime.  Tolerating well, no recent falls, does have to be constantly reminded to pick up his feet.  Has been dealing with right hip pain, taking Tylenol.  REVIEW OF SYSTEMS: Out of a complete 14 system review of symptoms, the patient complains only of the following symptoms, and all other reviewed systems are negative.  Seizure  ALLERGIES: No Known Allergies  HOME MEDICATIONS: Outpatient Medications Prior to Visit  Medication Sig Dispense Refill  . acetaminophen (TYLENOL) 500 MG tablet Take 1 tablet (500 mg total) by mouth every 8 (eight) hours as needed for moderate pain. 30 tablet 0  . aspirin 81 MG chewable tablet Chew 81 mg by mouth daily.    Marland Kitchen atorvastatin (LIPITOR) 20 MG tablet Take 20 mg by mouth daily at 6 PM.    . benztropine (COGENTIN) 2 MG tablet Take 2 mg by mouth 2 (two) times daily.     . bictegravir-emtricitabine-tenofovir AF (BIKTARVY) 50-200-25 MG TABS tablet Take 1 tablet by mouth daily. 30 tablet 11  . cetirizine (ZYRTEC) 10 MG tablet TAKE 1 TABLET ONCE DAILY. 30 tablet 3  . cloZAPine (CLOZARIL) 100 MG tablet Take 300 mg by mouth at bedtime.    . clozapine (FAZACLO) 100 MG disintegrating tablet 200mg  in the morning and 100mg  at bedtime    . famotidine (PEPCID) 20 MG tablet Take 20 mg by mouth 2 (two) times daily.    . famotidine (PEPCID) 40 MG tablet Take 40 mg by mouth daily.    . ferrous sulfate 325 (65 FE) MG tablet Take by mouth.    . folic acid (FOLVITE) 1 MG tablet Take 1 mg by mouth daily.    Marland Kitchen gabapentin (NEURONTIN) 300 MG capsule Take 300 mg by mouth daily.    Marland Kitchen gabapentin (NEURONTIN) 400 MG capsule Take 400 mg by mouth 2 (two) times daily.     . haloperidol (HALDOL) 5 MG tablet Take 2 tablets (10 mg total) by mouth daily with supper. (Patient taking differently: Take 5 mg by mouth daily  with supper. )    . haloperidol decanoate (HALDOL DECANOATE) 50 MG/ML injection     . levETIRAcetam (KEPPRA) 750 MG tablet Take 1 tablet (750 mg total) by mouth 2 (two) times daily. 180 tablet 4  . metFORMIN (GLUCOPHAGE) 500 MG tablet Take 500 mg by mouth daily.    . metFORMIN (GLUCOPHAGE-XR) 500 MG 24 hr tablet Take 250 mg by mouth 2 (two) times daily.     . polyethylene glycol (MIRALAX / GLYCOLAX) packet Take 17 g by mouth daily. (Patient taking differently: Take 17 g by mouth daily as needed for mild constipation. ) 30 each 0  . senna (SENOKOT) 8.6 MG tablet Take 2 tablets by mouth 2 (two) times daily.    Marland Kitchen senna (SENOKOT) 8.6 MG tablet Take by mouth.    . tizanidine (ZANAFLEX) 2 MG capsule Take 1 capsule (2 mg total) by mouth 3 (three) times daily. 15 capsule 0  . valproic acid (DEPAKENE) 250 MG capsule Take 2 capsules every morning then take 4 capsules at bedtime 540 capsule 4   No facility-administered medications prior to visit.    PAST MEDICAL HISTORY: Past Medical History:  Diagnosis Date  .  Anemia   . Anemia   . Anxiety   . Bipolar 1 disorder ([place])   . Depression, major   . HIV (human immunodeficiency virus infection) ([place])   . Hyperglycemia 08/06/2017  . Hypernatremia   . Kidney failure   . Lithium toxicity   . MR (mental retardation)   . Schizoaffective disorder ([place]) 08/06/2017  . Schizophrenia ([place])   . Seizures ([place])     PAST SURGICAL HISTORY: Past Surgical History:  Procedure Laterality Date  . BIOPSY N/A 04/12/2014   Procedure: BIOPSY;  Surgeon: Danie Binder, MD;  Location: [facility];  Service: Endoscopy;  Laterality: N/A;  Gastric  . COLONOSCOPY WITH PROPOFOL N/A 04/12/2014   SLF:  1. one colon polyp removed 2. The left colon is extremely redundant 3. Small internal hemorrhoids  . ESOPHAGOGASTRODUODENOSCOPY (EGD) WITH PROPOFOL N/A 04/12/2014   SLF: Stricture at the gastroesophageal junction 2. small polyp  in teh gastric body 3. Mild Non-erosive gastritis.   Marland Kitchen NOSE  SURGERY  1968   unsure what type of surgery. something to help him breathe better  . POLYPECTOMY N/A 04/12/2014   Procedure: POLYPECTOMY;  Surgeon: Danie Binder, MD;  Location: [facility];  Service: Endoscopy;  Laterality: N/A;  Cecal  . SAVORY DILATION N/A 04/12/2014   Procedure: SAVORY DILATION;  Surgeon: Danie Binder, MD;  Location: [facility];  Service: Endoscopy;  Laterality: N/A;  12.8/ 14/15/16  . SKIN BIOPSY      FAMILY HISTORY: Family History  Problem Relation Age of Onset  . Other Mother        unknown medical history  . Other Father        unknown medical history  . Colon cancer Neg Hx     SOCIAL HISTORY: Social History   Socioeconomic History  . Marital status: Single    Spouse name: Not on file  . Number of children: 0  . Years of education: 9th grade  . Highest education level: Not on file  Occupational History  . Occupation: Disabled  Tobacco Use  . Smoking status: Never Smoker  . Smokeless tobacco: Never Used  Vaping Use  . Vaping Use: Never used  Substance and Sexual Activity  . Alcohol use: No    Alcohol/week: 0.0 standard drinks  . Drug use: No  . Sexual activity: Never    Birth control/protection: Abstinence    Comment: DECLINED CONDOMS  Other Topics Concern  . Not on file  Social History Narrative   Lives in group home.   Right-handed.   2-3 sodas per day.   Social Determinants of Health   Financial Resource Strain:   . Difficulty of Paying Living Expenses:   Food Insecurity:   . Worried About Charity fundraiser in the Last Year:   . Arboriculturist in the Last Year:   Transportation Needs:   . Film/video editor (Medical):   Marland Kitchen Lack of Transportation (Non-Medical):   Physical Activity:   . Days of Exercise per Week:   . Minutes of Exercise per Session:   Stress:   . Feeling of Stress :   Social Connections:   . Frequency of Communication with Friends and Family:   . Frequency of Social Gatherings with Friends and Family:   . Attends  Religious Services:   . Active Member of Clubs or Organizations:   . Attends Archivist Meetings:   Marland Kitchen Marital Status:   Intimate Partner Violence:   . Fear of  Current or Ex-Partner:   . Emotionally Abused:   Marland Kitchen Physically Abused:   . Sexually Abused:    PHYSICAL EXAM  Vitals:   10/04/19 1112  BP: 130/70  Pulse: 90  Weight: 189 lb (85.7 kg)  Height: 5\' 9"  (1.753 m)   Body mass index is 27.91 kg/m.  Generalized: Well developed, in no acute distress   Neurological examination  Mentation: Alert, most history is provided by caregiver, follows commands, mild dysarthria Cranial nerve II-XII: Pupils were equal round reactive to light. Extraocular movements were full, visual field were full on confrontational test. Facial sensation and strength were normal. Head turning and shoulder shrug  were normal and symmetric. Motor: The motor testing reveals 5 over 5 strength of all 4 extremities. Good symmetric motor tone is noted throughout.  Sensory: Sensory testing is intact to soft touch on all 4 extremities. No evidence of extinction is noted.  Coordination: Cerebellar testing reveals good finger-nose-finger and heel-to-shin bilaterally (mild difficulty following exam commands) Gait and station: Gait forward leaning, knee flexion with walking, tends to drag feet Reflexes: Deep tendon reflexes are symmetric and normal bilaterally.   DIAGNOSTIC DATA (LABS, IMAGING, TESTING) - I reviewed patient records, labs, notes, testing and imaging myself where available.  Lab Results  Component Value Date   WBC 11.2 (H) 03/31/2019   HGB 13.4 03/31/2019   HCT 40.7 03/31/2019   MCV 89.8 03/31/2019   PLT 130 (L) 03/31/2019      Component Value Date/Time   NA 136 03/31/2019 0146   NA 143 09/24/2018 1413   K 3.9 03/31/2019 0146   CL 101 03/31/2019 0146   CO2 22 03/31/2019 0146   GLUCOSE 207 (H) 03/31/2019 0146   BUN 25 (H) 03/31/2019 0146   BUN 18 09/24/2018 1413   CREATININE 2.21 (H)  03/31/2019 0146   CREATININE 1.66 (H) 09/09/2018 1023   CALCIUM 9.5 03/31/2019 0146   CALCIUM 8.6 05/09/2011 0614   PROT 8.2 (H) 03/31/2019 0146   PROT 7.8 09/24/2018 1413   ALBUMIN 4.3 03/31/2019 0146   ALBUMIN 4.6 09/24/2018 1413   AST 144 (H) 03/31/2019 0146   ALT 53 (H) 03/31/2019 0146   ALKPHOS 57 03/31/2019 0146   BILITOT 0.6 03/31/2019 0146   BILITOT <0.2 09/24/2018 1413   GFRNONAA 31 (L) 03/31/2019 0146   GFRNONAA 38 (L) 07/21/2017 1120   GFRAA 36 (L) 03/31/2019 0146   GFRAA 44 (L) 07/21/2017 1120   Lab Results  Component Value Date   CHOL 226 (H) 03/13/2016   HDL 36 (L) 03/13/2016   LDLCALC 148 (H) 03/13/2016   TRIG 209 (H) 03/13/2016   CHOLHDL 6.3 (H) 03/13/2016   No results found for: HGBA1C Lab Results  Component Value Date   VITAMINB12 461 05/11/2011   Lab Results  Component Value Date   TSH 0.730 07/20/2016    ASSESSMENT AND PLAN 59 y.o. year old male  has a past medical history of Anemia, Anemia, Anxiety, Bipolar 1 disorder ([place]), Depression, major, HIV (human immunodeficiency virus infection) ([place]), Hyperglycemia (08/06/2017), Hypernatremia, Kidney failure, Lithium toxicity, MR (mental retardation), Schizoaffective disorder ([place]) (08/06/2017), Schizophrenia ([place]), and Seizures ([place]). here with:  1.  Epilepsy -Most recent seizure was January 06, 2019 -Continue valproic acid (Depakene)  250 mg, 2 capsules in the morning/4 capsules at nighttime -Continue Keppra 750 mg twice daily -Check routine blood work today -Follow-up in 6 months or sooner if needed  2.  Bipolar disorder 3.  HIV positive 4.  Gait abnormalities -Likely  due to long-term neurotrophic medication, medication side effect, deconditioning  I spent 30 minutes of face-to-face and non-face-to-face time with patient.  This included previsit chart review, lab review, study review, order entry, electronic health record documentation, patient education.  Butler Denmark, AGNP-C, DNP 10/04/2019, 11:33  AM [facility] [address], [place], [place] [address] [phone]

## 2019-10-06 LAB — COMPREHENSIVE METABOLIC PANEL
ALT: 16 IU/L (ref 0–44)
AST: 20 IU/L (ref 0–40)
Albumin/Globulin Ratio: 1.6 (ref 1.2–2.2)
Albumin: 4.6 g/dL (ref 3.8–4.9)
Alkaline Phosphatase: 76 IU/L (ref 48–121)
BUN/Creatinine Ratio: 11 (ref 10–24)
BUN: 17 mg/dL (ref 8–27)
Bilirubin Total: <0.2 mg/dL (ref 0.0–1.2)
CO2: 25 mmol/L (ref 20–29)
Calcium: 10 mg/dL (ref 8.6–10.2)
Chloride: 105 mmol/L (ref 96–106)
Creatinine, Ser: 1.61 mg/dL — ABNORMAL HIGH (ref 0.76–1.27)
GFR calc Af Amer: 53 mL/min/{1.73_m2} — ABNORMAL LOW (ref >59)
GFR calc non Af Amer: 46 mL/min/{1.73_m2} — ABNORMAL LOW (ref >59)
Globulin, Total: 2.8 g/dL (ref 1.5–4.5)
Glucose: 137 mg/dL — ABNORMAL HIGH (ref 65–99)
Potassium: 4.2 mmol/L (ref 3.5–5.2)
Sodium: 143 mmol/L (ref 134–144)
Total Protein: 7.4 g/dL (ref 6.0–8.5)

## 2019-10-06 LAB — CBC WITH DIFFERENTIAL/PLATELET
Basophils Absolute: 0 10*3/uL (ref 0.0–0.2)
Basos: 0 %
EOS (ABSOLUTE): 0.6 10*3/uL — ABNORMAL HIGH (ref 0.0–0.4)
Eos: 8 %
Hematocrit: 37.8 % (ref 37.5–51.0)
Hemoglobin: 12.4 g/dL — ABNORMAL LOW (ref 13.0–17.7)
Immature Grans (Abs): 0 10*3/uL (ref 0.0–0.1)
Immature Granulocytes: 0 %
Lymphocytes Absolute: 2.1 10*3/uL (ref 0.7–3.1)
Lymphs: 31 %
MCH: 29.5 pg (ref 26.6–33.0)
MCHC: 32.8 g/dL (ref 31.5–35.7)
MCV: 90 fL (ref 79–97)
Monocytes Absolute: 0.5 10*3/uL (ref 0.1–0.9)
Monocytes: 8 %
Neutrophils Absolute: 3.6 10*3/uL (ref 1.4–7.0)
Neutrophils: 53 %
Platelets: 112 10*3/uL — ABNORMAL LOW (ref 150–450)
RBC: 4.2 x10E6/uL (ref 4.14–5.80)
RDW: 14.3 % (ref 11.6–15.4)
WBC: 6.9 10*3/uL (ref 3.4–10.8)

## 2019-10-06 LAB — LEVETIRACETAM LEVEL: Levetiracetam Lvl: 17.9 ug/mL (ref 10.0–40.0)

## 2019-10-06 LAB — VALPROIC ACID LEVEL: Valproic Acid Lvl: 76 ug/mL (ref 50–100)

## 2019-10-13 ENCOUNTER — Telehealth: Payer: Self-pay

## 2019-10-13 NOTE — Telephone Encounter (Signed)
Attempted to call pt, unable to leave VM. VM not set up

## 2019-10-13 NOTE — Telephone Encounter (Signed)
-----   Message from Suzzanne Cloud, NP sent at 10/07/2019  7:35 AM EDT ----- Labs are overall stable, platelet count 112 (we will continue to follow this), creatinine 1.61, ensure drinking enough water, random glucose elevated 137, Keppra, Depakote levels are stable.  We will continue to follow routine blood work, he should also continue to see his primary doctor for routine evaluation.

## 2019-10-14 ENCOUNTER — Telehealth: Payer: Self-pay

## 2019-10-14 NOTE — Telephone Encounter (Signed)
-----   Message from Suzzanne Cloud, NP sent at 10/07/2019  7:35 AM EDT ----- Labs are overall stable, platelet count 112 (we will continue to follow this), creatinine 1.61, ensure drinking enough water, random glucose elevated 137, Keppra, Depakote levels are stable.  We will continue to follow routine blood work, he should also continue to see his primary doctor for routine evaluation.

## 2019-10-14 NOTE — Telephone Encounter (Signed)
results given per provider,  voiced understanding all question answered.

## 2020-10-01 IMAGING — DX DG ABD PORTABLE 1V
1 series · 1 of 1 positions shown · non-contrast
Comparison: Plain films of the abdomen dated 02/19/2018 and
02/18/2018.

CLINICAL DATA: Small-bowel obstruction.

EXAM:
PORTABLE ABDOMEN - 1 VIEW

[abdomen]
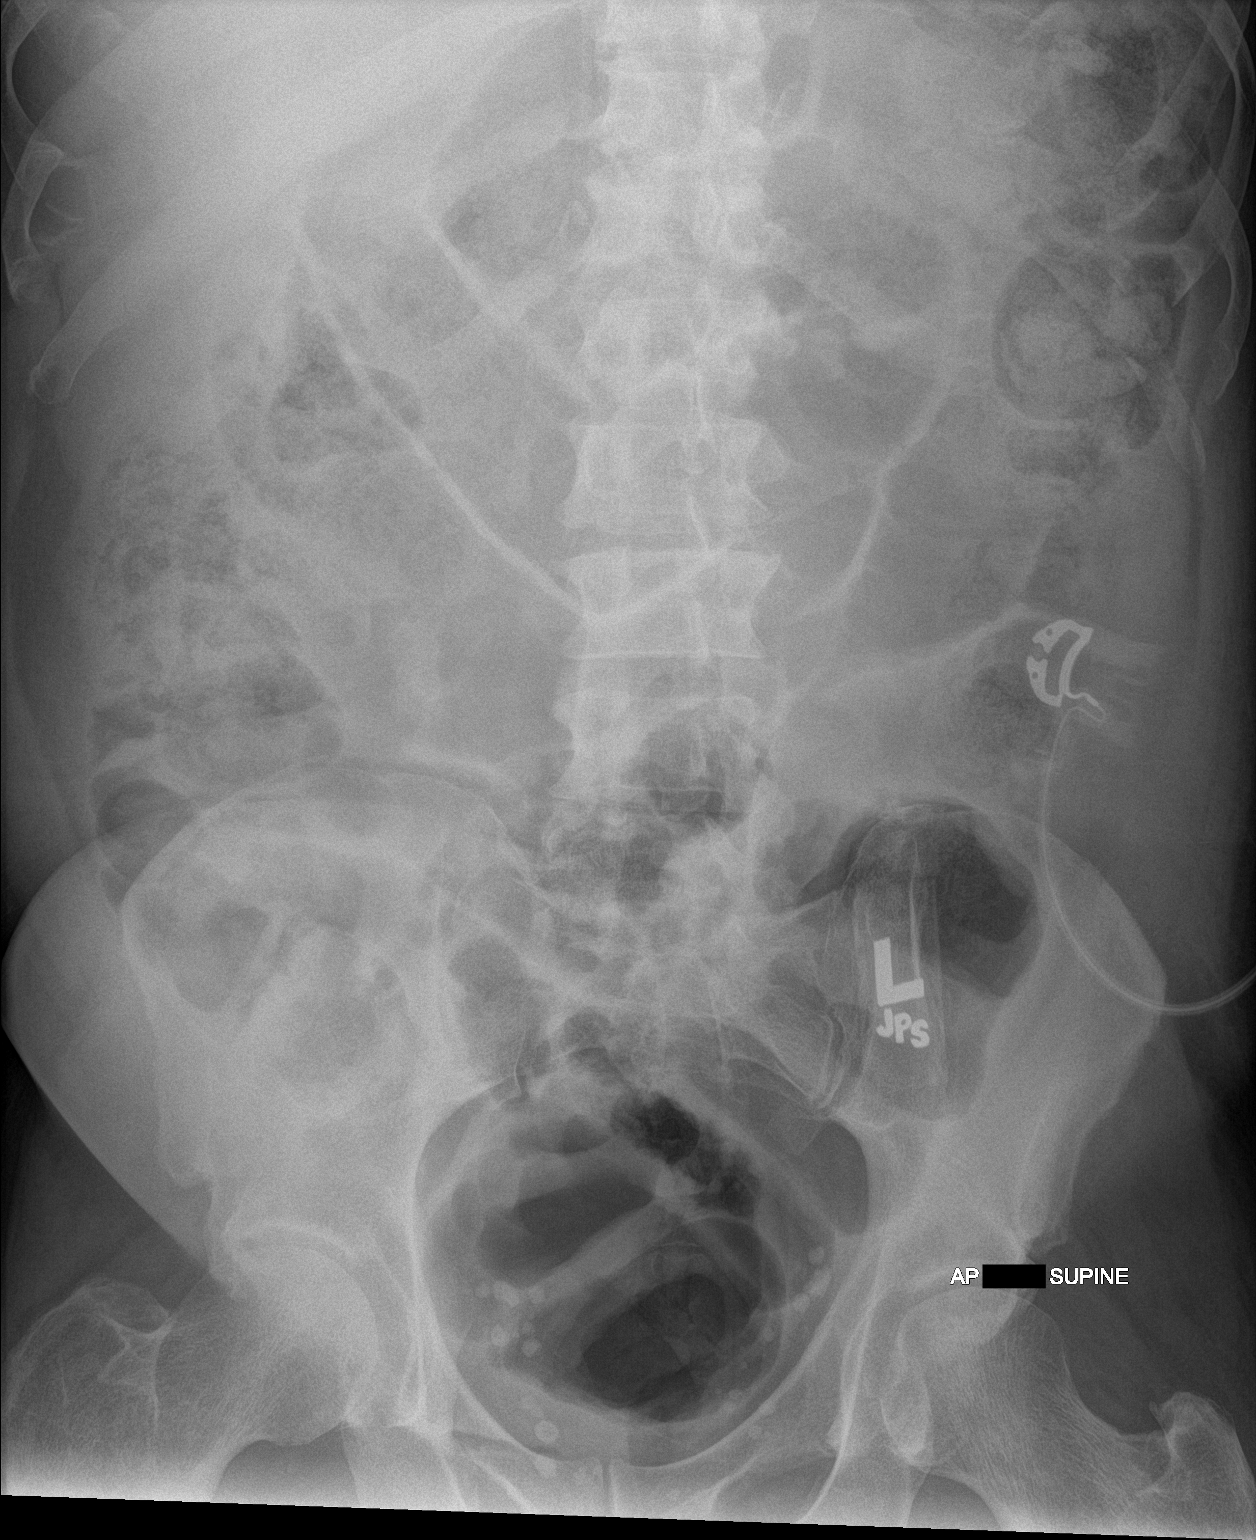

[1 of 1 positions shown; findings below may reference images not displayed]

FINDINGS: Continued gaseous distention of the small bowel loops throughout the
upper and central abdomen. Stool and gas noted within the colon. No
evidence of free intraperitoneal air, although the upper portion of
the abdomen is excluded on this exam.
IMPRESSION: Continued evidence of small bowel obstruction, presumably partial
obstruction as there is gas and stool seen in the colon, not
appreciably changed compared to the previous exams.

## 2020-10-02 IMAGING — DX DG ABD PORTABLE 1V
2 series · 2 of 2 positions shown · non-contrast
Comparison: 02/20/2018

CLINICAL DATA: Follow up small bowel obstruction

EXAM:
PORTABLE ABDOMEN - 1 VIEW

[abdomen kub (1 of 2)]
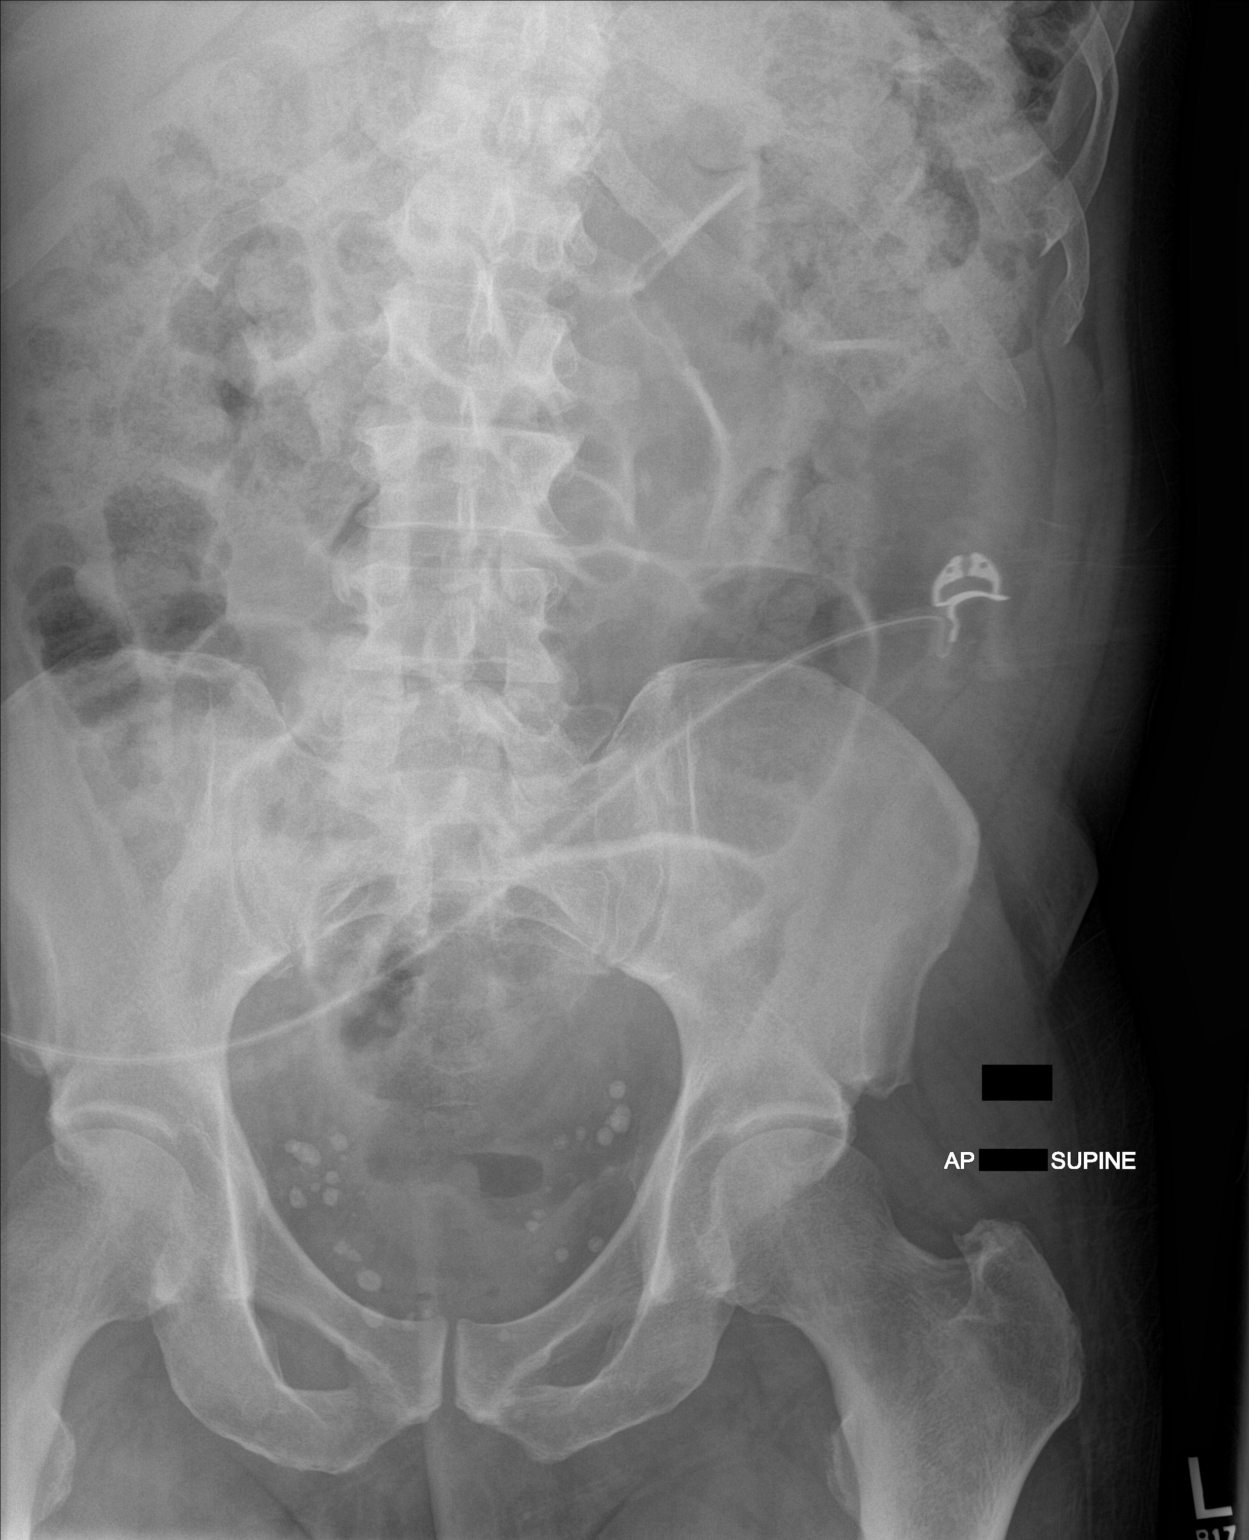

[abdomen kub (2 of 2)]
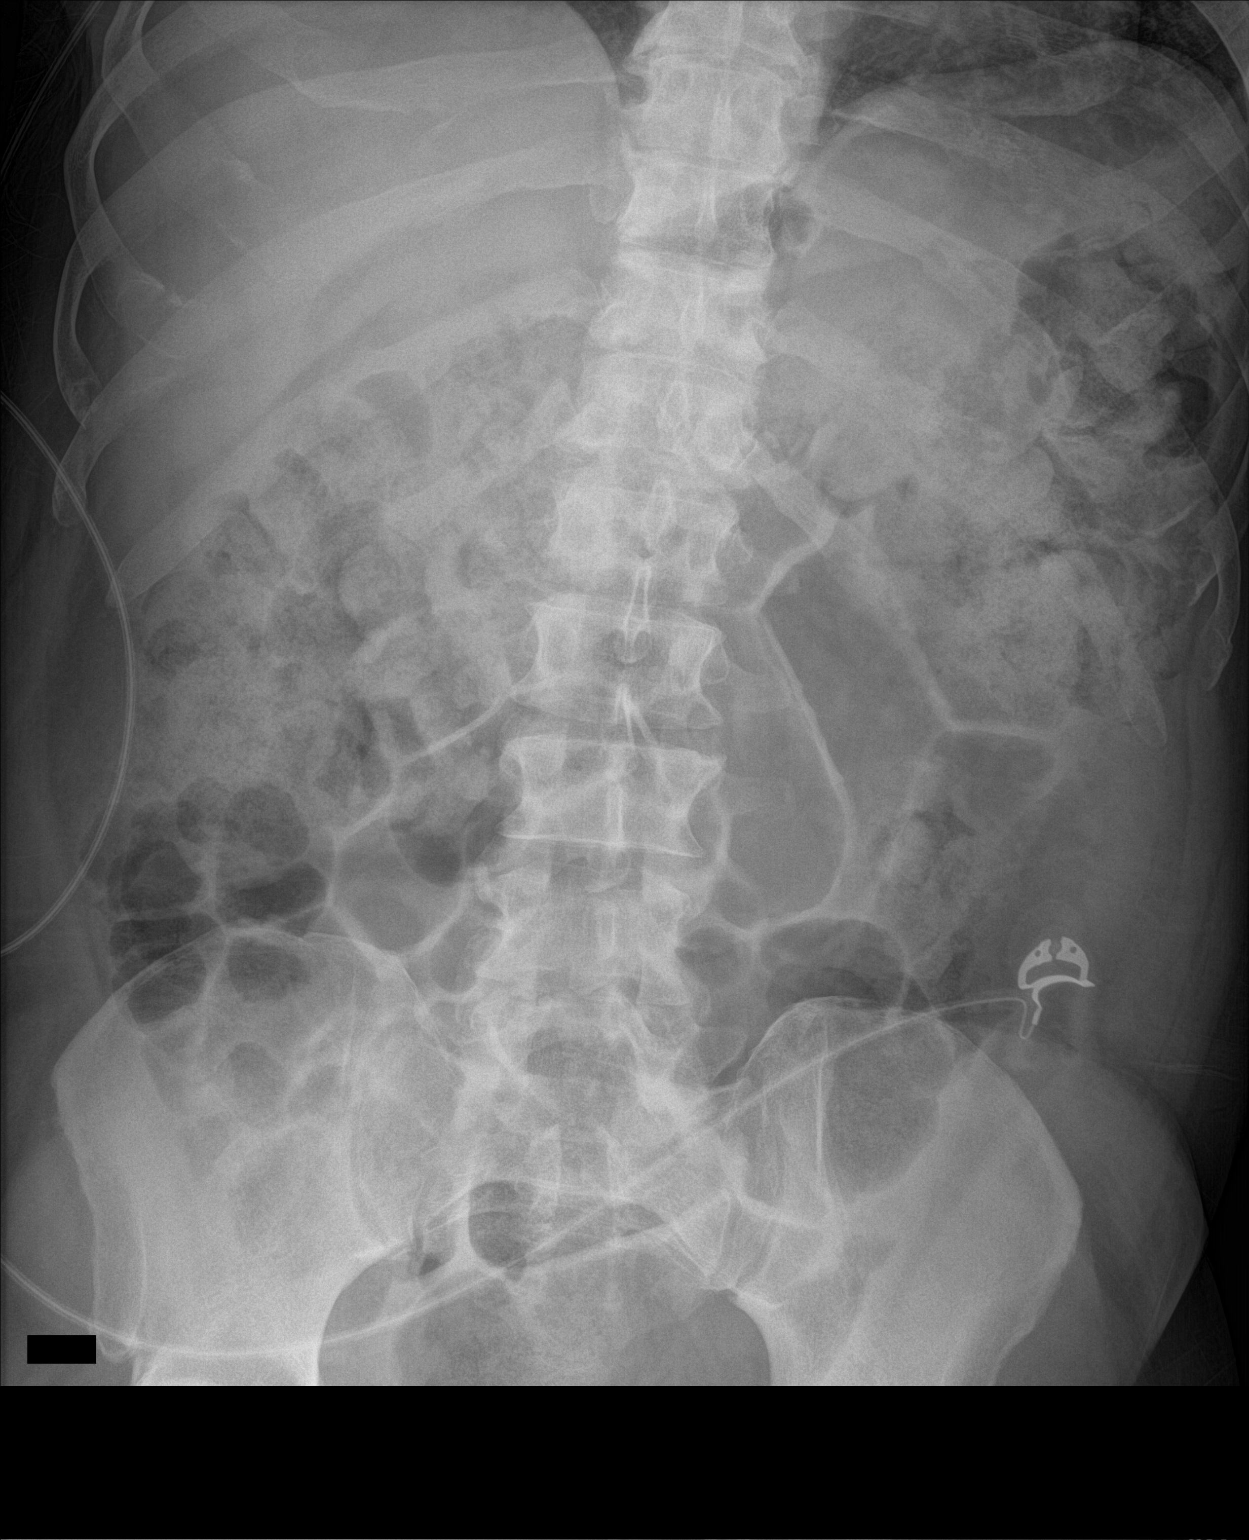

[2 of 2 positions shown; findings below may reference images not displayed]

FINDINGS: Scattered large and small bowel gas is noted. Considerable rounded
retained fecal material is again noted within the colon. No free air
is seen. Persistent small bowel dilatation is noted but slightly
decreased when compared with the prior study. No bony abnormality is
noted.
IMPRESSION: Stable stool burden within the colon.

Persistent but slightly improved small bowel dilatation.

## 2020-10-04 IMAGING — DX DG ABD PORTABLE 1V
2 series · 2 of 2 positions shown · non-contrast
Comparison: Abdominal radiographs of February 22, 2018

CLINICAL DATA: Follow-up small bowel obstruction.

EXAM:
PORTABLE ABDOMEN - 1 VIEW

[abdomen kub (1 of 2)]
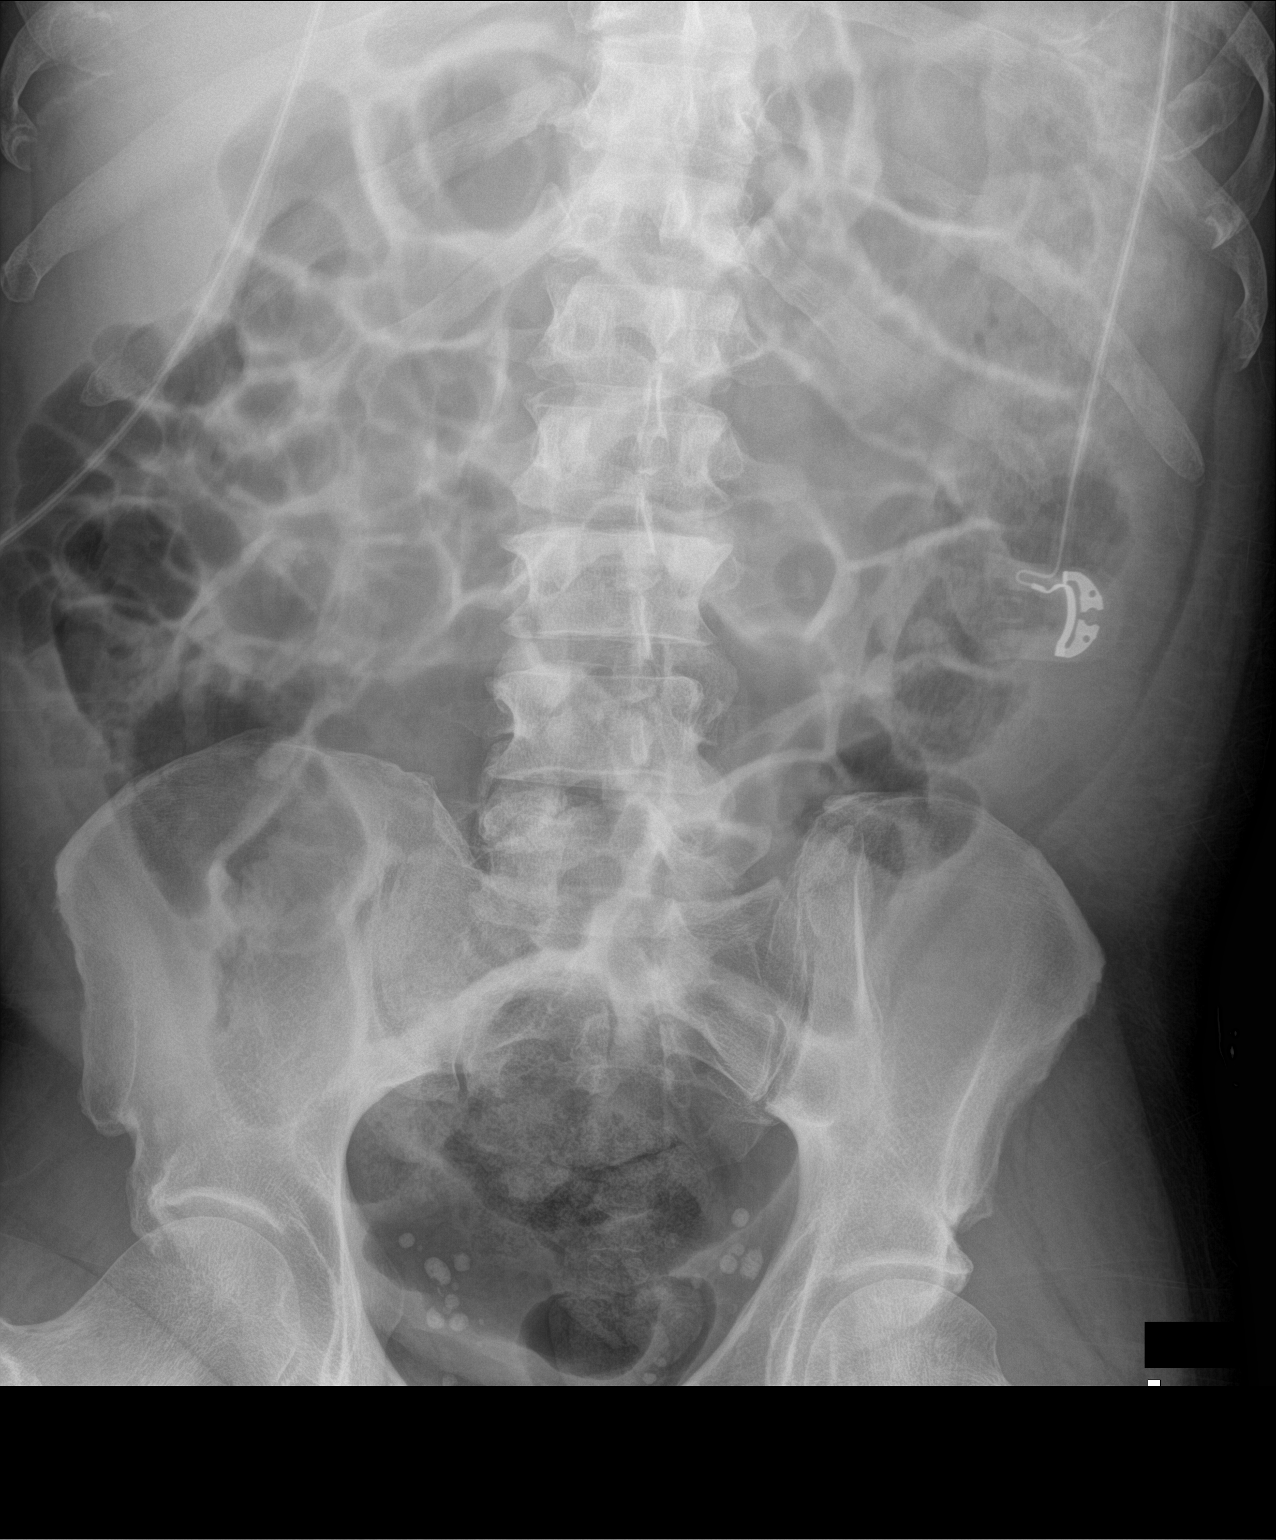

[abdomen kub (2 of 2)]
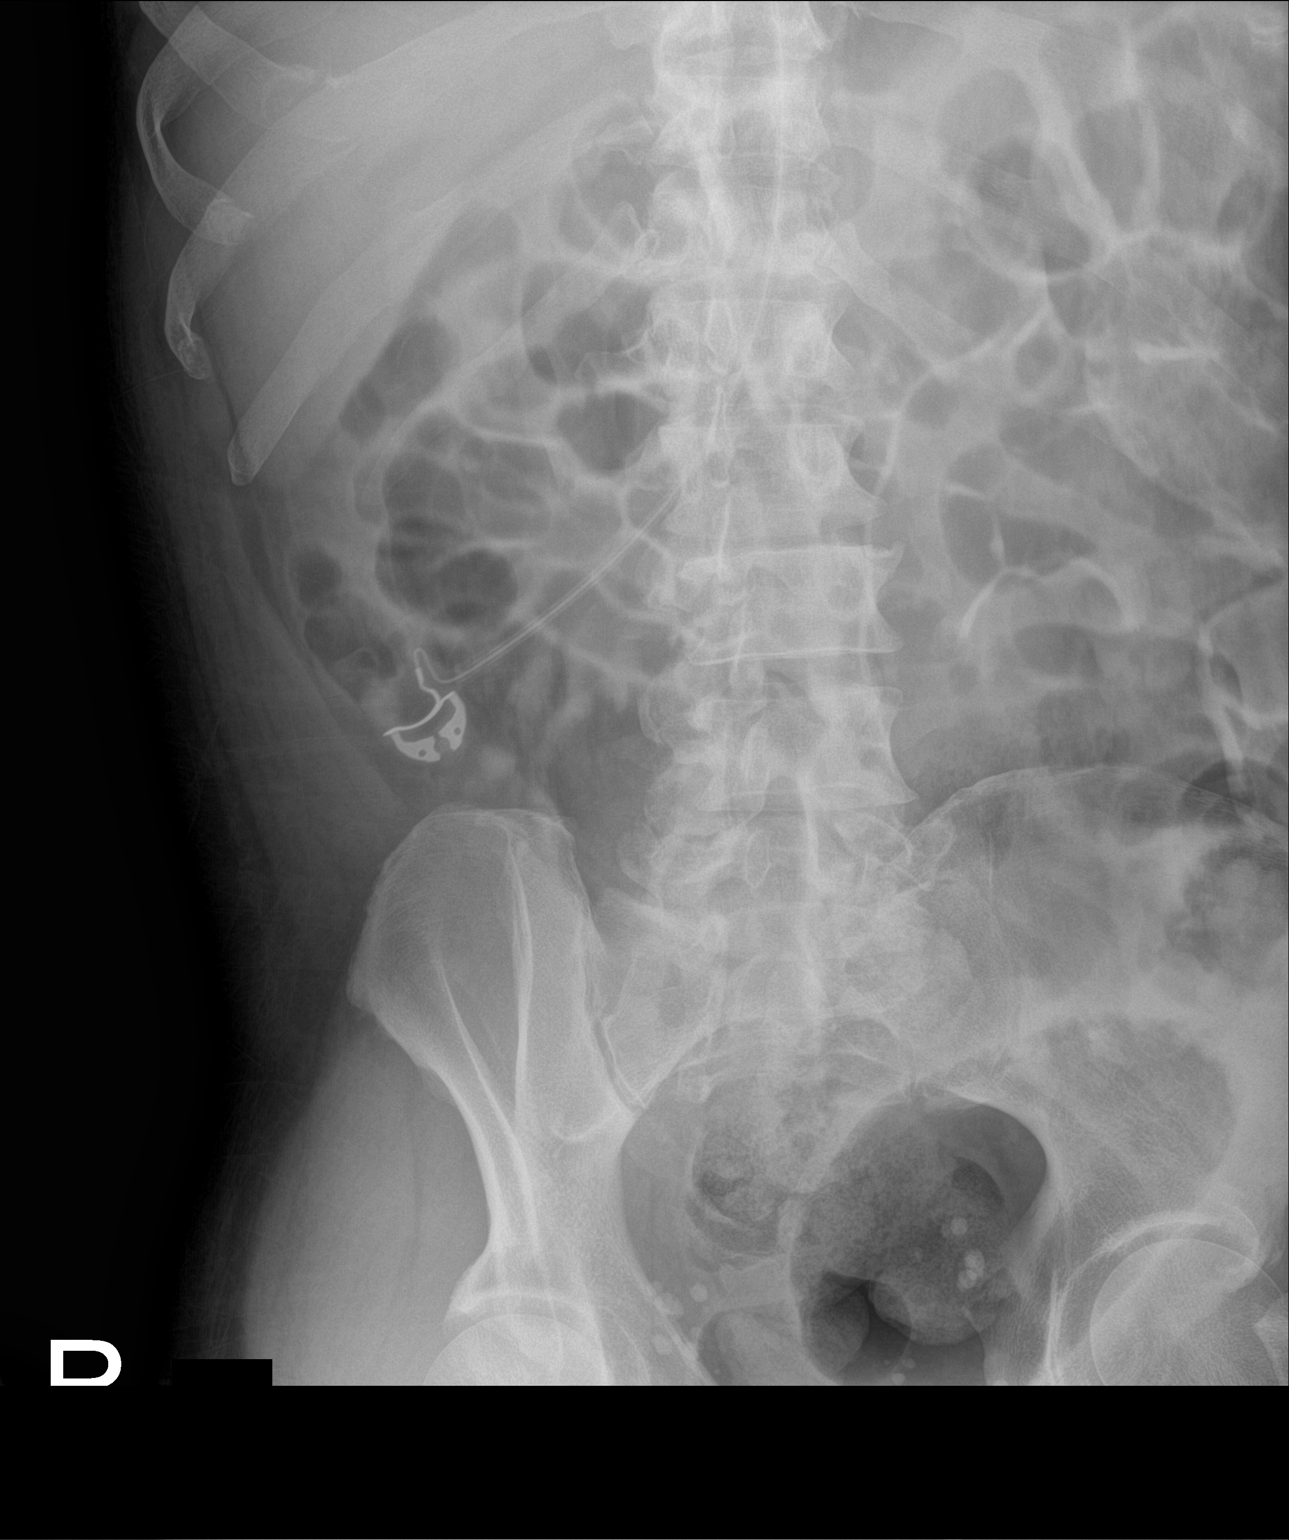

[2 of 2 positions shown; findings below may reference images not displayed]

FINDINGS: There is a moderate amount of gas within mildly distended small
bowel loops and normal caliber large bowel loops. There is a
moderate amount of stool in the left colon and rectum. No free
extraluminal gas collections are observed. The bony structures are
unremarkable. There are numerous pelvic phleboliths.
IMPRESSION: Fairly stable appearance of the abdomen with findings which may
reflect a generalized ileus or partial small bowel obstruction. A
moderate amount of stool remains in the left colon and rectum.

## 2020-10-25 ENCOUNTER — Encounter (HOSPITAL_COMMUNITY): Payer: Self-pay | Admitting: Physician Assistant

## 2020-10-25 ENCOUNTER — Ambulatory Visit (INDEPENDENT_AMBULATORY_CARE_PROVIDER_SITE_OTHER): Payer: Medicare Other | Admitting: Podiatry

## 2020-10-25 ENCOUNTER — Other Ambulatory Visit: Payer: Self-pay

## 2020-10-25 ENCOUNTER — Encounter: Payer: Self-pay | Admitting: Podiatry

## 2020-10-25 ENCOUNTER — Ambulatory Visit (INDEPENDENT_AMBULATORY_CARE_PROVIDER_SITE_OTHER): Payer: Medicare Other | Admitting: Physician Assistant

## 2020-10-25 VITALS — BP 152/102 | HR 130 | Ht 69.0 in | Wt 189.0 lb

## 2020-10-25 DIAGNOSIS — F79 Unspecified intellectual disabilities: Secondary | ICD-10-CM | POA: Diagnosis not present

## 2020-10-25 DIAGNOSIS — M79675 Pain in left toe(s): Secondary | ICD-10-CM | POA: Diagnosis not present

## 2020-10-25 DIAGNOSIS — R4689 Other symptoms and signs involving appearance and behavior: Secondary | ICD-10-CM | POA: Diagnosis not present

## 2020-10-25 DIAGNOSIS — M79674 Pain in right toe(s): Secondary | ICD-10-CM | POA: Insufficient documentation

## 2020-10-25 DIAGNOSIS — N183 Chronic kidney disease, stage 3 unspecified: Secondary | ICD-10-CM | POA: Diagnosis not present

## 2020-10-25 DIAGNOSIS — F259 Schizoaffective disorder, unspecified: Secondary | ICD-10-CM | POA: Diagnosis not present

## 2020-10-25 DIAGNOSIS — G4700 Insomnia, unspecified: Secondary | ICD-10-CM | POA: Insufficient documentation

## 2020-10-25 DIAGNOSIS — B351 Tinea unguium: Secondary | ICD-10-CM

## 2020-10-25 MED ORDER — BENZTROPINE MESYLATE 2 MG PO TABS: 2.0000 mg | ORAL_TABLET | Freq: Two times a day (BID) | ORAL | 2 refills | Status: DC

## 2020-10-25 MED ORDER — HALOPERIDOL 5 MG PO TABS: 10.0000 mg | ORAL_TABLET | Freq: Every day | ORAL | 2 refills | Status: DC

## 2020-10-25 MED ORDER — TRAZODONE HCL 50 MG PO TABS: 50.0000 mg | ORAL_TABLET | Freq: Every evening | ORAL | 2 refills | Status: AC | PRN

## 2020-10-25 MED ORDER — VALPROIC ACID 250 MG PO CAPS: ORAL_CAPSULE | ORAL | 2 refills | Status: DC
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
## 2020-10-25 NOTE — Addendum Note (Signed)
Addended by: Gardiner Barefoot on: 10/25/2020 11:51 AM   Modules accepted: Level of Service

## 2020-10-25 NOTE — Progress Notes (Signed)
This patient presents  to my office for at risk foot care.  This patient requires this care by a professional since this patient will be at risk due to having CKD.  This patient is unable to cut nails himself since the patient cannot reach his nails.These nails are painful walking and wearing shoes.  This patient presents for at risk foot care today.  General Appearance  Alert, conversant and in no acute stress.  Vascular  Dorsalis pedis and posterior tibial  pulses are palpable  bilaterally.  Capillary return is within normal limits  bilaterally. Temperature is within normal limits  bilaterally.  Neurologic  deferred  Nails Thick disfigured discolored nails with subungual debris  from hallux to fifth toes bilaterally. No evidence of bacterial infection or drainage bilaterally.  Orthopedic  No limitations of motion  feet .  No crepitus or effusions noted.  No bony pathology or digital deformities noted.  Skin  normotropic skin with no porokeratosis noted bilaterally.  No signs of infections or ulcers noted.     Onychomycosis  Pain in right toes  Pain in left toes  Consent was obtained for treatment procedures.   Mechanical debridement of nails 1-5  bilaterally performed with a nail nipper.  Filed with dremel without incident.    Return office visit    6 months                 Told patient to return for periodic foot care and evaluation due to potential at risk complications.   Gardiner Barefoot DPM

## 2020-10-28 ENCOUNTER — Other Ambulatory Visit: Payer: Self-pay

## 2020-10-28 ENCOUNTER — Emergency Department (HOSPITAL_COMMUNITY): Payer: Medicare Other

## 2020-10-28 ENCOUNTER — Observation Stay (HOSPITAL_COMMUNITY)
Admission: EM | Admit: 2020-10-28 | Discharge: 2020-10-31 | Disposition: A | Discharge status: Home / Self Care | Payer: Medicare Other | Attending: Internal Medicine | Admitting: Internal Medicine

## 2020-10-28 DIAGNOSIS — Z20822 Contact with and (suspected) exposure to covid-19: Secondary | ICD-10-CM | POA: Insufficient documentation

## 2020-10-28 DIAGNOSIS — B20 Human immunodeficiency virus [HIV] disease: Secondary | ICD-10-CM | POA: Insufficient documentation

## 2020-10-28 DIAGNOSIS — N179 Acute kidney failure, unspecified: Secondary | ICD-10-CM | POA: Diagnosis not present

## 2020-10-28 DIAGNOSIS — D649 Anemia, unspecified: Secondary | ICD-10-CM | POA: Insufficient documentation

## 2020-10-28 DIAGNOSIS — X31XXXA Exposure to excessive natural cold, initial encounter: Secondary | ICD-10-CM | POA: Insufficient documentation

## 2020-10-28 DIAGNOSIS — G9341 Metabolic encephalopathy: Principal | ICD-10-CM | POA: Insufficient documentation

## 2020-10-28 DIAGNOSIS — D696 Thrombocytopenia, unspecified: Secondary | ICD-10-CM

## 2020-10-28 DIAGNOSIS — E876 Hypokalemia: Secondary | ICD-10-CM | POA: Diagnosis not present

## 2020-10-28 DIAGNOSIS — T68XXXA Hypothermia, initial encounter: Secondary | ICD-10-CM | POA: Diagnosis not present

## 2020-10-28 DIAGNOSIS — Z7984 Long term (current) use of oral hypoglycemic drugs: Secondary | ICD-10-CM | POA: Insufficient documentation

## 2020-10-28 DIAGNOSIS — Z79899 Other long term (current) drug therapy: Secondary | ICD-10-CM | POA: Diagnosis not present

## 2020-10-28 DIAGNOSIS — F79 Unspecified intellectual disabilities: Secondary | ICD-10-CM | POA: Insufficient documentation

## 2020-10-28 DIAGNOSIS — Z7982 Long term (current) use of aspirin: Secondary | ICD-10-CM | POA: Insufficient documentation

## 2020-10-28 DIAGNOSIS — R569 Unspecified convulsions: Secondary | ICD-10-CM

## 2020-10-28 DIAGNOSIS — G40909 Epilepsy, unspecified, not intractable, without status epilepticus: Secondary | ICD-10-CM | POA: Insufficient documentation

## 2020-10-28 DIAGNOSIS — Y900 Blood alcohol level of less than 20 mg/100 ml: Secondary | ICD-10-CM | POA: Diagnosis not present

## 2020-10-28 DIAGNOSIS — N183 Chronic kidney disease, stage 3 unspecified: Secondary | ICD-10-CM | POA: Diagnosis not present

## 2020-10-28 DIAGNOSIS — N1831 Chronic kidney disease, stage 3a: Secondary | ICD-10-CM | POA: Diagnosis not present

## 2020-10-28 DIAGNOSIS — F259 Schizoaffective disorder, unspecified: Secondary | ICD-10-CM | POA: Diagnosis not present

## 2020-10-28 DIAGNOSIS — G9340 Encephalopathy, unspecified: Secondary | ICD-10-CM | POA: Diagnosis present

## 2020-10-28 DIAGNOSIS — R29818 Other symptoms and signs involving the nervous system: Secondary | ICD-10-CM | POA: Insufficient documentation

## 2020-10-28 DIAGNOSIS — R262 Difficulty in walking, not elsewhere classified: Secondary | ICD-10-CM | POA: Diagnosis not present

## 2020-10-28 DIAGNOSIS — R52 Pain, unspecified: Secondary | ICD-10-CM

## 2020-10-28 DIAGNOSIS — R401 Stupor: Secondary | ICD-10-CM | POA: Insufficient documentation

## 2020-10-28 DIAGNOSIS — R4182 Altered mental status, unspecified: Secondary | ICD-10-CM | POA: Insufficient documentation

## 2020-10-28 DIAGNOSIS — R2681 Unsteadiness on feet: Secondary | ICD-10-CM | POA: Insufficient documentation

## 2020-10-28 DIAGNOSIS — N390 Urinary tract infection, site not specified: Secondary | ICD-10-CM | POA: Insufficient documentation

## 2020-10-28 DIAGNOSIS — R4689 Other symptoms and signs involving appearance and behavior: Secondary | ICD-10-CM

## 2020-10-28 DIAGNOSIS — E871 Hypo-osmolality and hyponatremia: Secondary | ICD-10-CM | POA: Insufficient documentation

## 2020-10-28 DIAGNOSIS — E1122 Type 2 diabetes mellitus with diabetic chronic kidney disease: Secondary | ICD-10-CM | POA: Diagnosis not present

## 2020-10-28 DIAGNOSIS — K219 Gastro-esophageal reflux disease without esophagitis: Secondary | ICD-10-CM | POA: Insufficient documentation

## 2020-10-28 LAB — COMPREHENSIVE METABOLIC PANEL
ALT: 29 U/L (ref 0–44)
AST: 44 U/L — ABNORMAL HIGH (ref 15–41)
Albumin: 3.3 g/dL — ABNORMAL LOW (ref 3.5–5.0)
Alkaline Phosphatase: 45 U/L (ref 38–126)
Anion gap: 8 (ref 5–15)
BUN: 19 mg/dL (ref 8–23)
CO2: 24 mmol/L (ref 22–32)
Calcium: 8.8 mg/dL — ABNORMAL LOW (ref 8.9–10.3)
Chloride: 106 mmol/L (ref 98–111)
Creatinine, Ser: 1.7 mg/dL — ABNORMAL HIGH (ref 0.61–1.24)
GFR, Estimated: 45 mL/min — ABNORMAL LOW (ref >60)
Glucose, Bld: 145 mg/dL — ABNORMAL HIGH (ref 70–99)
Potassium: 3.2 mmol/L — ABNORMAL LOW (ref 3.5–5.1)
Sodium: 138 mmol/L (ref 135–145)
Total Bilirubin: 0.4 mg/dL (ref 0.3–1.2)
Total Protein: 6.6 g/dL (ref 6.5–8.1)

## 2020-10-28 LAB — RAPID URINE DRUG SCREEN, HOSP PERFORMED
Amphetamines: NOT DETECTED
Barbiturates: NOT DETECTED
Benzodiazepines: NOT DETECTED
Cocaine: NOT DETECTED
Opiates: NOT DETECTED
Tetrahydrocannabinol: NOT DETECTED

## 2020-10-28 LAB — ETHANOL: Alcohol, Ethyl (B): <10 mg/dL (ref <10)

## 2020-10-28 LAB — CBC WITH DIFFERENTIAL/PLATELET
Abs Immature Granulocytes: 0.01 10*3/uL (ref 0.00–0.07)
Basophils Absolute: 0 10*3/uL (ref 0.0–0.1)
Basophils Relative: 0 %
Eosinophils Absolute: 0.2 10*3/uL (ref 0.0–0.5)
Eosinophils Relative: 3 %
HCT: 33.5 % — ABNORMAL LOW (ref 39.0–52.0)
Hemoglobin: 10.8 g/dL — ABNORMAL LOW (ref 13.0–17.0)
Immature Granulocytes: 0 %
Lymphocytes Relative: 25 %
Lymphs Abs: 1.7 10*3/uL (ref 0.7–4.0)
MCH: 29.6 pg (ref 26.0–34.0)
MCHC: 32.2 g/dL (ref 30.0–36.0)
MCV: 91.8 fL (ref 80.0–100.0)
Monocytes Absolute: 0.7 10*3/uL (ref 0.1–1.0)
Monocytes Relative: 10 %
Neutro Abs: 4.1 10*3/uL (ref 1.7–7.7)
Neutrophils Relative %: 62 %
Platelets: 117 10*3/uL — ABNORMAL LOW (ref 150–400)
RBC: 3.65 MIL/uL — ABNORMAL LOW (ref 4.22–5.81)
RDW: 14.2 % (ref 11.5–15.5)
WBC: 6.7 10*3/uL (ref 4.0–10.5)
nRBC: 0 % (ref 0.0–0.2)

## 2020-10-28 LAB — LACTIC ACID, PLASMA
Lactic Acid, Venous: 1.7 mmol/L (ref 0.5–1.9)
Lactic Acid, Venous: 1.6 mmol/L (ref 0.5–1.9)

## 2020-10-28 LAB — VALPROIC ACID LEVEL: Valproic Acid Lvl: 53 ug/mL (ref 50.0–100.0)

## 2020-10-28 LAB — AMMONIA: Ammonia: 33 umol/L (ref 9–35)

## 2020-10-28 LAB — CBG MONITORING, ED: Glucose-Capillary: 143 mg/dL — ABNORMAL HIGH (ref 70–99)

## 2020-10-28 MED ORDER — ONDANSETRON HCL 4 MG PO TABS: 4.0000 mg | ORAL_TABLET | Freq: Four times a day (QID) | ORAL | Status: DC | PRN

## 2020-10-28 MED ORDER — LIDOCAINE HCL (PF) 1 % IJ SOLN
5.0000 mL | Freq: Once | INTRAMUSCULAR | Status: AC
  Administered 2020-10-28: 5 mL
  Filled 2020-10-28: qty 5

## 2020-10-28 MED ORDER — GADOBUTROL 1 MMOL/ML IV SOLN
8.5000 mL | Freq: Once | INTRAVENOUS | Status: AC | PRN
  Administered 2020-10-28: 8.5 mL via INTRAVENOUS

## 2020-10-28 MED ORDER — SODIUM CHLORIDE 0.9 % IV SOLN
750.0000 mg | Freq: Two times a day (BID) | INTRAVENOUS | Status: DC
  Administered 2020-10-28 – 2020-10-29 (×2): 750 mg via INTRAVENOUS
  Filled 2020-10-28 (×4): qty 7.5

## 2020-10-28 MED ORDER — ACETAMINOPHEN 650 MG RE SUPP: 650.0000 mg | Freq: Four times a day (QID) | RECTAL | Status: DC | PRN

## 2020-10-28 MED ORDER — POTASSIUM CHLORIDE 10 MEQ/100ML IV SOLN
10.0000 meq | INTRAVENOUS | Status: AC
  Administered 2020-10-29 (×2): 10 meq via INTRAVENOUS
  Filled 2020-10-28 (×2): qty 100

## 2020-10-28 MED ORDER — SODIUM CHLORIDE 0.9 % IV SOLN
2.0000 g | Freq: Two times a day (BID) | INTRAVENOUS | Status: DC
  Administered 2020-10-29 – 2020-10-30 (×3): 2 g via INTRAVENOUS
  Filled 2020-10-28 (×5): qty 2

## 2020-10-28 MED ORDER — ONDANSETRON HCL 4 MG/2ML IJ SOLN
4.0000 mg | Freq: Four times a day (QID) | INTRAMUSCULAR | Status: DC | PRN
  Administered 2020-10-29: 4 mg via INTRAVENOUS
  Filled 2020-10-28: qty 2

## 2020-10-28 MED ORDER — NALOXONE HCL 2 MG/2ML IJ SOSY
2.0000 mg | PREFILLED_SYRINGE | Freq: Once | INTRAMUSCULAR | Status: AC
  Administered 2020-10-28: 2 mg via INTRAVENOUS
  Filled 2020-10-28: qty 2

## 2020-10-28 MED ORDER — SODIUM CHLORIDE 0.9 % IV BOLUS
1000.0000 mL | Freq: Once | INTRAVENOUS | Status: AC
Start: 2020-10-28 — End: 2020-10-28
  Administered 2020-10-28: 1000 mL via INTRAVENOUS

## 2020-10-28 MED ORDER — KCL-LACTATED RINGERS 20 MEQ/L IV SOLN
INTRAVENOUS | Status: DC
  Filled 2020-10-28: qty 1000

## 2020-10-28 MED ORDER — VANCOMYCIN HCL 1500 MG/300ML IV SOLN
1500.0000 mg | Freq: Once | INTRAVENOUS | Status: AC
  Administered 2020-10-28: 1500 mg via INTRAVENOUS
  Filled 2020-10-28: qty 300

## 2020-10-28 MED ORDER — LACTATED RINGERS IV BOLUS
1000.0000 mL | Freq: Once | INTRAVENOUS | Status: AC
  Administered 2020-10-29: 1000 mL via INTRAVENOUS

## 2020-10-28 MED ORDER — SODIUM CHLORIDE 0.9 % IV SOLN
2.0000 g | Freq: Four times a day (QID) | INTRAVENOUS | Status: DC
  Administered 2020-10-29 (×2): 2 g via INTRAVENOUS
  Filled 2020-10-28 (×4): qty 2000

## 2020-10-28 MED ORDER — VALPROATE SODIUM 100 MG/ML IV SOLN
500.0000 mg | Freq: Three times a day (TID) | INTRAVENOUS | Status: DC
  Administered 2020-10-28 – 2020-10-29 (×3): 500 mg via INTRAVENOUS
  Filled 2020-10-28 (×6): qty 5

## 2020-10-28 MED ORDER — ACETAMINOPHEN 325 MG PO TABS: 650.0000 mg | ORAL_TABLET | Freq: Four times a day (QID) | ORAL | Status: DC | PRN

## 2020-10-28 MED ORDER — DEXTROSE 5 % IV SOLN
10.0000 mg/kg | Freq: Two times a day (BID) | INTRAVENOUS | Status: DC
  Administered 2020-10-29: 855 mg via INTRAVENOUS
  Filled 2020-10-28 (×3): qty 17.1

## 2020-10-28 MED ORDER — VANCOMYCIN HCL 1250 MG/250ML IV SOLN: 1250.0000 mg | INTRAVENOUS | Status: DC

## 2020-10-28 MED ORDER — SODIUM CHLORIDE 0.9 % IV SOLN
2.0000 g | Freq: Once | INTRAVENOUS | Status: AC
  Administered 2020-10-28: 2 g via INTRAVENOUS
  Filled 2020-10-28: qty 2

## 2020-10-28 NOTE — ED Notes (Signed)
At bedside assisting provider with LP at this time with Fransico Him. EMT.

## 2020-10-28 NOTE — ED Notes (Signed)
Paged neuro to determine if pt can go to MRI with EEG in place. Waiting on call back at this time

## 2020-10-28 NOTE — ED Notes (Signed)
Radiology at bedside at this time.

## 2020-10-28 NOTE — ED Provider Notes (Signed)
Care of the patient was assumed from resident, see this physician's note for complete history of present illness, review of systems, and physical exam.  Briefly, the patient is a 60 y.o. male who presented to the ED with altered mental status.  Patient is only responded to deep painful stimulation.  Originally patient responded with Narcan, however then is back to not responding unless painfully stimulated.    Plan at time of handoff:  -Awaiting work-up for altered mental status.   Physical Exam  BP 122/74 (BP Location: Right Arm)   Pulse 67   Temp (!) 96.2 F (35.7 C) (Rectal)   Resp 13   Ht '5\' 9"'$  (1.753 m)   Wt 85.7 kg   SpO2 96%   BMI 27.90 kg/m   Physical Exam Constitutional:      General: He is in acute distress.     Appearance: He is ill-appearing. He is not toxic-appearing or diaphoretic.     Comments: Patient will only respond to painful stimuli.  Eyes:     Comments: Pinpoint pupils.  Cardiovascular:     Rate and Rhythm: Normal rate and regular rhythm.  Pulmonary:     Effort: Pulmonary effort is normal.     Breath sounds: Normal breath sounds.  Musculoskeletal:        General: Normal range of motion.  Skin:    General: Skin is warm and dry.     Capillary Refill: Capillary refill takes less than 2 seconds.  Neurological:     General: No focal deficit present.     Mental Status: He is oriented to person, place, and time.  Psychiatric:        Mood and Affect: Mood normal.        Behavior: Behavior normal.        Thought Content: Thought content normal.    ED Course/Procedures     .Critical Care  Date/Time: 10/28/2020 9:25 PM Performed by: Alfredia Client, PA-C Authorized by: Alfredia Client, PA-C   Critical care provider statement:    Critical care time (minutes):  45   Critical care was time spent personally by me on the following activities:  Discussions with consultants, evaluation of patient's response to treatment, examination of patient, ordering and  performing treatments and interventions, ordering and review of laboratory studies, ordering and review of radiographic studies, pulse oximetry, re-evaluation of patient's condition, obtaining history from patient or surrogate and review of old charts .Lumbar Puncture  Date/Time: 10/28/2020 11:04 PM Performed by: Alfredia Client, PA-C Authorized by: Alfredia Client, PA-C   Consent:    Consent obtained:  Emergent situation   Consent given by: attempted to call legal gaurdian twice.   Alternatives discussed:  Delayed treatment Universal protocol:    Patient identity confirmed:  Verbally with patient Pre-procedure details:    Procedure purpose:  Diagnostic   Preparation: Patient was prepped and draped in usual sterile fashion   Anesthesia:    Anesthesia method:  Local infiltration   Local anesthetic:  Lidocaine 1% w/o epi Procedure details:    Lumbar space:  L3-L4 interspace   Patient position:  L lateral decubitus   Needle gauge:  18 Comments:     Attempted three times with Dr. Ralene Bathe, unsuccessful   MDM   Patient presents to the emerge department today for altered mental status, see handoff note. On my exam patient will only respond to painful stimuli, consulted neurology for questionable status seizures.  We will load for Keppra at this time, will also  obtain blood cultures and start empiric antibiotics since patient is hypothermic and altered.  Bear hugger placed.  Patient without a white count, normal lactic acid.  Urine drug screen normal.  CT head without any acute abnormalities, did talk to neurology who is requesting LP at this time.  P Patient will need to be admitted to medicine for altered mental status, questionable HIV encephalopathy? Consider polypharmacy, pt is on multiple psychiatric medicaitos. Dr. Quinn Axe states she will put in orders for empiric coverage for HIV encephalotomy.  EEG placed.  Two hours later, repeat normal neuro exams with no change.  Upon reevaluation an  hour later, we did attempt LP after neurology did recommend this.  Aware that platelets are 117, patient is not on any blood thinners.  However benefits outweigh risks at this time if HIV encephalopathy is on the differential.  Did speak to neurology, Dr. Quinn Axe and my attending Dr. Ayesha Rumpf about this who agrees.  Was unable to successfully notify legal guardian twice, however did discuss that this is emergent situation and patient will need this especially if patient has HIV encephalopathy or neurosyphilis.  Did explain this to group home who agrees.  Was able to speak to them who states that he was doing fine this morning until he was at the dinner table and slumped over.  States that there is no way he could have gotten medications from other residents, has never had anything like this to happen to him before.  Unfortunately we were unable to get LP due to patient positioning, unfortunately unable to sedate patient since patient is altered.  Patient did start to move and wake up during LP, mental status improving.  Patient's MRIs back, are normal.  Patient will be admitted at this time for continuing altered mental status, will need IR guided LP.  Patient admitted to Dr. Olevia Bowens.  The patient appears reasonably stabilized for admission considering the current resources, flow, and capabilities available in the ED at this time, and I doubt any other [facility] requiring further screening and/or treatment in the ED prior to admission. I discussed this case with my attending physician who cosigned this note including patient's presenting symptoms, physical exam, and planned diagnostics and interventions. Attending physician stated agreement with plan or made changes to plan which were implemented.   Attending physician assessed patient at bedside.       Alfredia Client, PA-C 10/29/20 0003    Quintella Reichert, MD 10/30/20 [phone]

## 2020-10-28 NOTE — ED Notes (Signed)
Bair hugger placed on pt. No improvement in pt condition, but BP remains soft. PA Patel notified. See new orders.

## 2020-10-28 NOTE — ED Notes (Signed)
Arlyss Repress Caregiver [phone] call to speak with pharmacy

## 2020-10-28 NOTE — ED Notes (Signed)
4 warm blankets applied to pt. PA made aware of pt's temperature and lack of response to narcan.

## 2020-10-28 NOTE — ED Notes (Signed)
Pharmacy called about medication

## 2020-10-28 NOTE — ED Notes (Signed)
EEG tech here to place on pt.

## 2020-10-28 NOTE — ED Provider Notes (Signed)
[facility] EMERGENCY DEPARTMENT Provider Note   CSN: [id number] Arrival date & time: 10/28/20  1349     History Chief Complaint  Patient presents with   Altered Mental Status    JOHARI SCHERFF is a 60 y.o. male presenting from his group home to the ED for altered mental status. Per EMS, patient was at lunch in the group home and was found slumped over at the table. Unsure if he took any illicit substances or if he drank alcohol. He was breathing 4-6 times per min when they got to him, had pinpoint pupils, and was only responsive to pain. EMS gave the pt 1 mg of narcan and he did not have much of an improvement in his status. They gave the pt another 1 mg of of narcan and he became more responsive. Initial BP was 75/39 and EMS gave the patient 1L of fluid and BP improved to 116/88. Blood glucose was 141. Patient is still altered upon presentation to the ED. Gave him another 2 mg of narcan with no improvement in mental status. Patient is only responsive to pain. His vitals are stable and he is currently protecting his airway.   The history is provided by the EMS personnel. The history is limited by the condition of the patient.  Altered Mental Status Presenting symptoms: unresponsiveness   Severity:  Severe Most recent episode:  Today Chronicity:  New     Past Medical History:  Diagnosis Date   Anemia    Anemia    Anxiety    Bipolar 1 disorder ([place])    Depression, major    HIV (human immunodeficiency virus infection) ([place])    Hyperglycemia 08/06/2017   Hypernatremia    Kidney failure    Lithium toxicity    MR (mental retardation)    Schizoaffective disorder ([place]) 08/06/2017   Schizophrenia ([place])    Seizures ([place])     Patient Active Problem List   Diagnosis Date Noted   Insomnia 10/25/2020   Pain due to onychomycosis of toenails of both feet 10/25/2020   Sepsis due to pneumonia ([place]) 02/16/2018   Diarrhea 02/16/2018   Gait abnormality 02/09/2018    Hyperglycemia 08/06/2017   Schizoaffective disorder ([place]) 08/06/2017   Bipolar 1 disorder ([place])    Schizophrenia ([place])    Seizures ([place])    Altered mental state 07/20/2016   Aggressive behavior    GERD (gastroesophageal reflux disease) 07/25/2014   Acute encephalopathy 06/23/2014   Hypernatremia 06/20/2014   Dehydration 06/18/2014   Lithium toxicity 06/18/2014   ARF (acute renal failure) ([place]) 06/18/2014   Acute urinary retention 06/18/2014   CAP (community acquired pneumonia) 06/18/2014   Encounter for screening colonoscopy 03/25/2014   Dysphagia, pharyngoesophageal phase 03/25/2014   CKD (chronic kidney disease) stage 3, GFR 30-59 ml/min ([place]) 07/22/2012   HIV disease ([place]) 06/19/2012   Bipolar 1 disorder, mixed, moderate ([place]) 06/19/2012   Hyponatremia 06/19/2012   Intellectual disability 06/19/2012    Past Surgical History:  Procedure Laterality Date   BIOPSY N/A 04/12/2014   Procedure: BIOPSY;  Surgeon: Danie Binder, MD;  Location: [facility];  Service: Endoscopy;  Laterality: N/A;  Gastric   COLONOSCOPY WITH PROPOFOL N/A 04/12/2014   SLF:  1. one colon polyp removed 2. The left colon is extremely redundant 3. Small internal hemorrhoids   ESOPHAGOGASTRODUODENOSCOPY (EGD) WITH PROPOFOL N/A 04/12/2014   SLF: Stricture at the gastroesophageal junction 2. small polyp  in teh gastric body 3. Mild Non-erosive gastritis.  NOSE SURGERY  1968   unsure what type of surgery. something to help him breathe better   POLYPECTOMY N/A 04/12/2014   Procedure: POLYPECTOMY;  Surgeon: Danie Binder, MD;  Location: [facility];  Service: Endoscopy;  Laterality: N/A;  Cecal   SAVORY DILATION N/A 04/12/2014   Procedure: SAVORY DILATION;  Surgeon: Danie Binder, MD;  Location: [facility];  Service: Endoscopy;  Laterality: N/A;  12.8/ 14/15/16   SKIN BIOPSY         Family History  Problem Relation Age of Onset   Other Mother        unknown medical history   Other Father        unknown medical history    Colon cancer Neg Hx     Social History   Tobacco Use   Smoking status: Never   Smokeless tobacco: Never  Vaping Use   Vaping Use: Never used  Substance Use Topics   Alcohol use: No    Alcohol/week: 0.0 standard drinks   Drug use: No    Home Medications Prior to Admission medications   Medication Sig Start Date End Date Taking? Authorizing Provider  acetaminophen (TYLENOL) 500 MG tablet Take 1 tablet (500 mg total) by mouth every 8 (eight) hours as needed for moderate pain. 06/21/19   Loura Halt A, NP  aspirin 81 MG chewable tablet Chew 81 mg by mouth daily.    [provider]  atorvastatin (LIPITOR) 20 MG tablet Take 20 mg by mouth daily at 6 PM.    [provider]  benztropine (COGENTIN) 2 MG tablet Take 1 tablet (2 mg total) by mouth 2 (two) times daily. 10/25/20   Nwoko, Terese Door, PA  bictegravir-emtricitabine-tenofovir AF (BIKTARVY) 50-200-25 MG TABS tablet Take 1 tablet by mouth daily. 09/24/18   Truman Hayward, MD  cetirizine (ZYRTEC) 10 MG tablet TAKE 1 TABLET ONCE DAILY. 01/21/18   Forrest Moron, MD  cloZAPine (CLOZARIL) 100 MG tablet Take 300 mg by mouth at bedtime. 06/03/19   [provider]  clozapine (FAZACLO) 100 MG disintegrating tablet '200mg'$  in the morning and '100mg'$  at bedtime    [provider]  famotidine (PEPCID) 20 MG tablet Take 20 mg by mouth 2 (two) times daily. 06/10/19   [provider]  famotidine (PEPCID) 40 MG tablet Take 40 mg by mouth daily. 07/28/18   [provider]  ferrous sulfate 325 (65 FE) MG tablet Take by mouth.    [provider]  folic acid (FOLVITE) 1 MG tablet Take 1 mg by mouth daily.    [provider]  gabapentin (NEURONTIN) 300 MG capsule Take 300 mg by mouth daily. 06/10/19   [provider]  gabapentin (NEURONTIN) 400 MG capsule Take 400 mg by mouth 2 (two) times daily.     [provider]  haloperidol (HALDOL) 5 MG tablet Take 2 tablets (10 mg  total) by mouth daily with supper. 10/25/20   Nwoko, Terese Door, PA  haloperidol decanoate (HALDOL DECANOATE) 50 MG/ML injection  06/05/19   [provider]  levETIRAcetam (KEPPRA) 750 MG tablet Take 1 tablet (750 mg total) by mouth 2 (two) times daily. 03/16/19   Marcial Pacas, MD  metFORMIN (GLUCOPHAGE) 500 MG tablet Take 500 mg by mouth daily. 06/10/19   [provider]  metFORMIN (GLUCOPHAGE-XR) 500 MG 24 hr tablet Take 250 mg by mouth 2 (two) times daily.     [provider]  polyethylene glycol (MIRALAX / GLYCOLAX)  packet Take 17 g by mouth daily. Patient taking differently: Take 17 g by mouth daily as needed for mild constipation. 02/24/18   Thurnell Lose, MD  senna (SENOKOT) 8.6 MG tablet Take 2 tablets by mouth 2 (two) times daily.    [provider]  senna (SENOKOT) 8.6 MG tablet Take by mouth.    [provider]  tizanidine (ZANAFLEX) 2 MG capsule Take 1 capsule (2 mg total) by mouth 3 (three) times daily. 06/21/19   Loura Halt A, NP  traZODone (DESYREL) 50 MG tablet Take 1 tablet (50 mg total) by mouth at bedtime as needed for sleep. 10/25/20   Nwoko, Terese Door, PA  valproic acid (DEPAKENE) 250 MG capsule Take 2 capsules every morning then take 4 capsules at bedtime 10/25/20   Malachy Mood, PA    Allergies    Patient has no known allergies.  Review of Systems   Review of Systems  Reason unable to perform ROS: patient is altered.   Physical Exam Updated Vital Signs BP 119/65 (BP Location: Right Arm)   Pulse 71   Temp (!) 96.8 F (36 C) (Axillary)   Resp 18   Ht '5\' 9"'$  (1.753 m)   Wt 85.7 kg   SpO2 96%   BMI 27.90 kg/m   Physical Exam Constitutional:      Comments: Patient is not alert or oriented. Only responsive to pain  Eyes:     Comments: Pupils are pinpoint  Cardiovascular:     Rate and Rhythm: Normal rate and regular rhythm.     Pulses: Normal pulses.     Heart sounds: Normal heart sounds.  Pulmonary:     Effort: No  respiratory distress.     Breath sounds: No wheezing, rhonchi or rales.  Abdominal:     General: Bowel sounds are normal. There is no distension.     Palpations: Abdomen is soft.  Musculoskeletal:     Right lower leg: No edema.     Left lower leg: No edema.  Neurological:     Mental Status: He is disoriented.     Comments: Unable to assess due to patient's altered mental status. Patient unable to respond to commands, he is only responsive to pain    ED Results / Procedures / Treatments   Labs (all labs ordered are listed, but only abnormal results are displayed) Labs Reviewed  CBC WITH DIFFERENTIAL/PLATELET - Abnormal; Notable for the following components:      Result Value   RBC 3.65 (*)    Hemoglobin 10.8 (*)    HCT 33.5 (*)    Platelets 117 (*)    All other components within normal limits  COMPREHENSIVE METABOLIC PANEL - Abnormal; Notable for the following components:   Potassium 3.2 (*)    Glucose, Bld 145 (*)    Creatinine, Ser 1.70 (*)    Calcium 8.8 (*)    Albumin 3.3 (*)    AST 44 (*)    GFR, Estimated 45 (*)    All other components within normal limits  CBG MONITORING, ED - Abnormal; Notable for the following components:   Glucose-Capillary 143 (*)    All other components within normal limits  LACTIC ACID, PLASMA  AMMONIA  RAPID URINE DRUG SCREEN, HOSP PERFORMED  LACTIC ACID, PLASMA  ETHANOL  URINALYSIS, ROUTINE W REFLEX MICROSCOPIC  VALPROIC ACID LEVEL    EKG EKG Interpretation  Date/Time:  Saturday October 28 2020 14:00:53 EDT Ventricular Rate:  81 PR Interval:  186 QRS Duration: 98 QT Interval:  373 QTC Calculation: 433 R Axis:   59 Text Interpretation: Sinus rhythm When compared to prior, slower rate. No STEMI Confirmed by Antony Blackbird [phone]) on 10/28/2020 2:39:10 PM  Radiology CT Head Wo Contrast  Result Date: 10/28/2020 CLINICAL DATA:  Possible OD. Unsure of what happened but pt was given Narcan in the field as well as in the ED. Pt  unresponsive with pinpoint pupils at time of test EXAM: CT HEAD WITHOUT CONTRAST TECHNIQUE: Contiguous axial images were obtained from the base of the skull through the vertex without intravenous contrast. COMPARISON:  05/02/2018 FINDINGS: Brain: No evidence of acute infarction, hemorrhage, hydrocephalus, extra-axial collection or mass lesion/mass effect. Vascular: No hyperdense vessel or unexpected calcification. Skull: Normal. Negative for fracture or focal lesion. Sinuses/Orbits: Globes and orbits are unremarkable. Opacified left frontal sinus, chronic and stable from the prior CT. Remaining sinuses are clear. Other: None. IMPRESSION: 1. No acute intracranial abnormalities. Electronically Signed   By: Lajean Manes M.D.   On: 10/28/2020 15:11   DG Chest Portable 1 View  Result Date: 10/28/2020 CLINICAL DATA:  Altered mental status. EXAM: PORTABLE CHEST 1 VIEW COMPARISON:  April 17, 2018. FINDINGS: The heart size and mediastinal contours are within normal limits. Minimal bibasilar subsegmental atelectasis is noted. The visualized skeletal structures are unremarkable. IMPRESSION: Minimal bibasilar subsegmental atelectasis. Electronically Signed   By: Marijo Conception M.D.   On: 10/28/2020 14:36    Procedures Procedures   Medications Ordered in ED Medications  naloxone (NARCAN) injection 2 mg (has no administration in time range)  naloxone [facility]) injection 2 mg (2 mg Intravenous Given 10/28/20 1411)    ED Course  I have reviewed the triage vital signs and the nursing notes.  Pertinent labs & imaging results that were available during my care of the patient were reviewed by me and considered in my medical decision making (see chart for details).    MDM Rules/Calculators/A&P                          Patient is a 60 yo male presenting from his group home to the ED for altered mental status. Patient reportedly slumped over at the lunch table. Patient given 1 L fluid and '2mg'$  narcan from EMS  which made the patient more responsive. He was back to being unresponsive by the time he got to the ED even after being given another 2 mg of narcan here. Patient only responsive to pain. CBC, CMP, EtOH, lactic acid, ammonia ordered. Ct head showing no acute intracranial abnormalities. CXR with minimal bibasilar subsegmental atelectasis. Lactic acid normal.   Patient signed out to the PA, Alfredia Client and Dr. Ralene Bathe.   Final Clinical Impression(s) / ED Diagnoses Final diagnoses:  None    Rx / DC Orders ED Discharge Orders     None        Dorethea Clan, DO 10/28/20 1539    Tegeler, Gwenyth Allegra, MD 10/29/20 [phone]

## 2020-10-28 NOTE — ED Notes (Signed)
Remains on MRI table at this time no change in status

## 2020-10-28 NOTE — Progress Notes (Signed)
vLTM EEG started. MRI leads were placed. Notified neuro

## 2020-10-28 NOTE — ED Notes (Signed)
Pt remains on MRI table at this time with no change in status

## 2020-10-28 NOTE — ED Notes (Signed)
Pt transported to MRI at this time with RN at side

## 2020-10-28 NOTE — ED Notes (Signed)
Pt remains unresponsive, pupils are pinpoint bilat, remains on bear hugger and monitor. EEG in place. Waiting on MRI

## 2020-10-28 NOTE — ED Notes (Signed)
Pt started opening his eyes and moving his arms when we arrived to MRI. Attempted to ask pt questions but pt unable to to answer questions at this time

## 2020-10-28 NOTE — ED Notes (Signed)
Returned from MRI noted gurgling resp. Provider made aware of same and was at bedside

## 2020-10-28 NOTE — Consult Note (Addendum)
Neurology Consultation Reason for Consult: altered mental status Requesting Physician: Ralene Bathe  CC: altered consciousness  History is obtained from:patient's chart and ED staff  HPI: Nathan [name] is a 60 y.o. male  with history of HIV , developmental delay, seizure disorder on keppra '750mg'$  bid and depakote '500mg'$  four times daily, and schizoaffective disorder who presented from his group home to the ED for altered mental status. Per EMS, patient was at lunch in the group home and was found slumped over at the table. Unsure if he took any illicit substances or if he drank alcohol. He was breathing 4-6 times per min when EMS arrived, and had pinpoint pupils; only responsive to pain. EMS gave him 1 mg of narcan and he did not have much of an improvement in his status. He was given another 1 mg of of narcan and he became more responsive. Initial BP was 75/39 and EMS gave the patient 1L of fluid and BP improved to 116/88. Blood glucose was 141.   He was brought to the ed and on approach he remained altered. He was given another 2 mg of narcan with no improvement in mental status. Patient is only responsive to pain. Neurology was called regarding this presentation, and we thank the ED team for their kind referral.  CT head showed no acute intracranial abnormality.   LKW: unclear     ROS:    Unable to obtain due to altered mental status.   Past Medical History:  Diagnosis Date   Anemia    Anemia    Anxiety    Bipolar 1 disorder ([facility])    Depression, major    HIV (human immunodeficiency virus infection) ([place])    Hyperglycemia 08/06/2017   Hypernatremia    Kidney failure    Lithium toxicity    MR (mental retardation)    Schizoaffective disorder ([place]) 08/06/2017   Schizophrenia ([place])    Seizures ([facility])       Family History  Problem Relation Age of Onset   Other Mother        unknown medical history   Other Father        unknown medical history   Colon cancer Neg Hx       Social  History:  reports that he has never smoked. He has never used smokeless tobacco. He reports that he does not drink alcohol and does not use drugs.    Exam: Current vital signs: BP 122/74 (BP Location: Right Arm)   Pulse 67   Temp (!) 96.2 F (35.7 C) (Rectal)   Resp 13   Ht '5\' 9"'$  (1.753 m)   Wt 85.7 kg   SpO2 96%   BMI 27.90 kg/m  Vital signs in last 24 hours: Temp:  [96.2 F (35.7 C)-96.8 F (36 C)] 96.2 F (35.7 C) (07/23 1547) Pulse Rate:  [66-86] 67 (07/23 1700) Resp:  [13-24] 13 (07/23 1700) BP: (85-149)/(61-74) 122/74 (07/23 1700) SpO2:  [95 %-98 %] 96 % (07/23 1700) Weight:  [85.7 kg] 85.7 kg (07/23 1357)   Physical Exam  Constitutional: Appears well-developed and well-nourished. Unresponsive to verbal stimuli. Localizes to pain at upper extremities and withdraws from pain at bilateral lower extremities, while yelling at examiner; eyes remained closed.  Psych: patient responds only to pain by yelling. Otherwise no response.  Eyes: No scleral injection. Pinpoint pupils seen at exam. No blink to threat.  HENT: No oropharyngeal obstruction.  MSK: no joint deformities.  Cardiovascular: Normal rate and regular rhythm.  Respiratory: Effort normal, non-labored breathing GI: Soft.  No distension. There is no tenderness.  Skin: Warm dry and intact visible skin  Neuro: Mental Status: Patient is unresponsive to verbal stimuli. Withdraws to pain at ble and localizes pain at upper extremities.  Patient is not able to give a clear and coherent history given presentation.   Cranial Nerves: II: Visual Fields are unable to assess. Pupils are equal, round, pinpoint.    III,IV, VI: EOMI without ptosis or diploplia.  V:  UTA VII: Facial movement is symmetric.  VIII: UTA X: UTA XI: UTA XII: tongue is midline without atrophy or fasciculations.   Motor and sensory: Tone is normal. Bulk is normal. Unable to assess strength at time of exam. Minimal withdrawal to noxious stimuli  in all extremities     Cerebellar: Unable to assess    I have reviewed labs in epic and the results pertinent to this consultation are:  Results for Nathan [name], Nathan [name] (MRN [id number]) as of 10/28/2020 17:28  Ref. Range 10/28/2020 14:18  Sodium Latest Ref Range: 135 - 145 mmol/L 138  Potassium Latest Ref Range: 3.5 - 5.1 mmol/L 3.2 (L)  Chloride Latest Ref Range: 98 - 111 mmol/L 106  CO2 Latest Ref Range: 22 - 32 mmol/L 24  Glucose Latest Ref Range: 70 - 99 mg/dL 145 (H)  BUN Latest Ref Range: 8 - 23 mg/dL 19  Creatinine Latest Ref Range: 0.61 - 1.24 mg/dL 1.70 (H)  Calcium Latest Ref Range: 8.9 - 10.3 mg/dL 8.8 (L)  Anion gap Latest Ref Range: 5 - 15  8  Alkaline Phosphatase Latest Ref Range: 38 - 126 U/L 45  Albumin Latest Ref Range: 3.5 - 5.0 g/dL 3.3 (L)  AST Latest Ref Range: 15 - 41 U/L 44 (H)  ALT Latest Ref Range: 0 - 44 U/L 29  Total Protein Latest Ref Range: 6.5 - 8.1 g/dL 6.6  Ammonia Latest Ref Range: 9 - 35 umol/L 33  Total Bilirubin Latest Ref Range: 0.3 - 1.2 mg/dL 0.4  GFR, Estimated Latest Ref Range: >60 mL/min 45 (L)  Results for Nathan [name], Nathan [name] (MRN [id number]) as of 10/28/2020 17:28  Ref. Range 10/28/2020 14:18  WBC Latest Ref Range: 4.0 - 10.5 K/uL 6.7  RBC Latest Ref Range: 4.22 - 5.81 MIL/uL 3.65 (L)  Hemoglobin Latest Ref Range: 13.0 - 17.0 g/dL 10.8 (L)  HCT Latest Ref Range: 39.0 - 52.0 % 33.5 (L)  MCV Latest Ref Range: 80.0 - 100.0 fL 91.8  MCH Latest Ref Range: 26.0 - 34.0 pg 29.6  MCHC Latest Ref Range: 30.0 - 36.0 g/dL 32.2  RDW Latest Ref Range: 11.5 - 15.5 % 14.2  Platelets Latest Ref Range: 150 - 400 K/uL 117 (L)  nRBC Latest Ref Range: 0.0 - 0.2 % 0.0    I have reviewed the images obtained:  CT head shows no acute intracranial abnormalities.   Impression:  60 year old male with history outlined above presents with altered mental status. Patient not responsive to verbal stimuli, responds only to noxious stimuli at this time. CT head shows no  acute intracranial abnormalities.   Recommendations:  - MRI brain wwo - MRA H&N - LP to be performed in ED w/ cell count x2 tubes, glucose, protein, gram stain, culture, HSV, CMV, EBV, enterovirus, cryptococcal antigen, VDRL - Empiric coverage with vanc, cefepime, ampicillin, acyclovir - Convert keppra '750mg'$  bid and depakote '1500mg'$  total daily dose to IV - cEEG - Seizure precautions -Utox  Will continue to  follow.  Su Monks, MD [facility] [phone]  If 7pm- 7am, please page neurology on call as listed in [place].  Addendum: EEG reviewed at bedside, no epileptiform activity upon hookup. cEEG to be monitored by [facility] overnight.

## 2020-10-28 NOTE — ED Notes (Signed)
Pt cleaned and changed sheets at this time with condom cath applied to leg bag at this time

## 2020-10-28 NOTE — Progress Notes (Addendum)
Pharmacy Antibiotic Note  Nathan [name] is a 60 y.o. male admitted on 10/28/2020 presenting with AMS, concern for sepsis.  Pharmacy has been consulted for vancomycin and cefepime dosing.  Plan: Vancomycin 1500 mg IV x 1, then 1250 mg IV q 24h (eAUC 488, Goal AUC 400-550, SCr 1.7) Cefepime 2g IV q 12h Monitor renal function, Cx and clinical progression to narrow Vancomycin levels as needed  Height: '5\' 9"'$  (175.3 cm) Weight: 85.7 kg (188 lb 15 oz) IBW/kg (Calculated) : 70.7  Temp (24hrs), Avg:96.5 F (35.8 C), Min:96.2 F (35.7 C), Max:96.8 F (36 C)  Recent Labs  Lab 10/28/20 1418 10/28/20 1552  WBC 6.7  --   CREATININE 1.70*  --   LATICACIDVEN 1.7 1.6    Estimated Creatinine Clearance: 48.9 mL/min (A) (by C-G formula based on SCr of 1.7 mg/dL (H)).    No Known Allergies  Nathan [name], PharmD Clinical Pharmacist ED Pharmacist Phone # [phone] 10/28/2020 6:13 PM  Addum:  Add acyclovir 10 mg/kg IV q12 hours

## 2020-10-28 NOTE — ED Notes (Signed)
Radiology messaged me saying pt needs KUB ordered for MRI and needs MRA changed to without. PA, Patel notified.

## 2020-10-28 NOTE — H&P (Signed)
History and Physical    Nathan [name] [id number] DOB: Aug 09, 1960 DOA: 10/28/2020  PCP: Javier Docker, MD  Patient coming from: Group home.  I have personally briefly reviewed patient's old medical records in [place]  Chief Complaint: Altered mental status  HPI: Nathan [name] is a 60 y.o. male with medical history significant of normocytic anemia, anxiety, bipolar 1 disorder, schizophrenia (schizoaffective disorder), MDD episodes, history of HIV on antiretroviral therapy, hyperglycemia, hyponatremia, history of renal failure, lithium toxicity, mental retardation, seizure disorder who is brought to the emergency department via EMS after the patient was found slumped over the table while having lunch.  They were unsure if the patient took any illicit substances or alcohol.  EMS noticed he was hypopneic with an RR of 4-6 with pinpoint pupils and only responsive to pain.  They administer 1 mg of naloxone without much response and then had some response after they administer a second 1 mg dose.  His initial blood pressure was 75/39 mmHg and he did receive 1 L of fluid bolus with improvement in his BP numbers to 116/88 mmHg.  The patient was still obtunded when he arrived to the emergency department and his mental status has not changed much since.  He is unable to provide further information at this time.  ED Course: Initial vital signs were temperature 96.8 F, pulse 86, respirations 24, BP 149/71 mmHg and O2 sat 95% on room air.  The ED med staff tried a lumbar puncture twice unsuccessfully.  He received a 1000 mL LR bolus, naloxone 2 mg IV x2, initial doses of cefepime and vancomycin.  He was seen by neurology who recommended to rule out encephalitis/meningitis due to his HIV history.  Lab work: CBC showed a white count 6.7 with a normal differential, hemoglobin 10.8 g/dL and platelets 117.  Lactic acid x2, ammonia level, alcohol and valproic acid level were normal.  CMP  showed a potassium of 3.2 mmol/L, the rest of the electrolytes are normal when calcium is corrected to albumin.  Glucose 145, BUN 19 and creatinine 1.70 mg/dL with a GFR of 45 mL/min.  Albumin was 3.3 g/dL and AST 44 units/L.  The rest of the hepatic functions are within normal range.  Imaging: Abdominal x-ray showed diffusely air-filled appearance of both large and small bowel with a fairly significant colonic stool burden.  Slow intestinal transit versus mild ileus to be considered.  One-view portable chest radiograph showed minimal bibasilar subsegmental atelectasis, but was otherwise unremarkable.  CT head without contrast did not show in any acute intracranial normalities.  MRI of brain and MRA of head and neck were normal.  Please see images and full radiology report for further detail.  Review of Systems: As per HPI otherwise all other systems reviewed and are negative.  Past Medical History:  Diagnosis Date   Anemia    Anemia    Anxiety    Bipolar 1 disorder (HCC)    Depression, major    HIV (human immunodeficiency virus infection) ([place])    Hyperglycemia 08/06/2017   Hypernatremia    Kidney failure    Lithium toxicity    MR (mental retardation)    Schizoaffective disorder ([place]) 08/06/2017   Schizophrenia ([place])    Seizures ([place])     Past Surgical History:  Procedure Laterality Date   BIOPSY N/A 04/12/2014   Procedure: BIOPSY;  Surgeon: Danie Binder, MD;  Location: [facility];  Service: Endoscopy;  Laterality: N/A;  Gastric  COLONOSCOPY WITH PROPOFOL N/A 04/12/2014   SLF:  1. one colon polyp removed 2. The left colon is extremely redundant 3. Small internal hemorrhoids   ESOPHAGOGASTRODUODENOSCOPY (EGD) WITH PROPOFOL N/A 04/12/2014   SLF: Stricture at the gastroesophageal junction 2. small polyp  in teh gastric body 3. Mild Non-erosive gastritis.    NOSE SURGERY  1968   unsure what type of surgery. something to help him breathe better   POLYPECTOMY N/A 04/12/2014   Procedure: POLYPECTOMY;   Surgeon: Danie Binder, MD;  Location: [facility];  Service: Endoscopy;  Laterality: N/A;  Cecal   SAVORY DILATION N/A 04/12/2014   Procedure: SAVORY DILATION;  Surgeon: Danie Binder, MD;  Location: [facility];  Service: Endoscopy;  Laterality: N/A;  12.8/ 14/15/16   SKIN BIOPSY     Social History  reports that he has never smoked. He has never used smokeless tobacco. He reports that he does not drink alcohol and does not use drugs.  No Known Allergies  Family History  Problem Relation Age of Onset   Other Mother        unknown medical history   Other Father        unknown medical history   Colon cancer Neg Hx    Prior to Admission medications   Medication Sig Start Date End Date Taking? Authorizing Provider  acetaminophen (TYLENOL) 500 MG tablet Take 1 tablet (500 mg total) by mouth every 8 (eight) hours as needed for moderate pain. 06/21/19   Loura Halt A, NP  aspirin 81 MG chewable tablet Chew 81 mg by mouth daily.    [provider]  atorvastatin (LIPITOR) 20 MG tablet Take 20 mg by mouth daily at 6 PM.    [provider]  benztropine (COGENTIN) 2 MG tablet Take 1 tablet (2 mg total) by mouth 2 (two) times daily. 10/25/20   Nwoko, Terese Door, PA  bictegravir-emtricitabine-tenofovir AF (BIKTARVY) 50-200-25 MG TABS tablet Take 1 tablet by mouth daily. 09/24/18   Truman Hayward, MD  cetirizine (ZYRTEC) 10 MG tablet TAKE 1 TABLET ONCE DAILY. 01/21/18   Forrest Moron, MD  cloZAPine (CLOZARIL) 100 MG tablet Take 300 mg by mouth at bedtime. 06/03/19   [provider]  clozapine (FAZACLO) 100 MG disintegrating tablet '200mg'$  in the morning and '100mg'$  at bedtime    [provider]  famotidine (PEPCID) 20 MG tablet Take 20 mg by mouth 2 (two) times daily. 06/10/19   [provider]  famotidine (PEPCID) 40 MG tablet Take 40 mg by mouth daily. 07/28/18   [provider]  ferrous sulfate 325 (65 FE) MG tablet Take by mouth.    [provider]  folic acid (FOLVITE) 1 MG tablet Take 1 mg by mouth daily.    [provider]  gabapentin (NEURONTIN) 300 MG capsule Take 300 mg by mouth daily. 06/10/19   [provider]  gabapentin (NEURONTIN) 400 MG capsule Take 400 mg by mouth 2 (two) times daily.     [provider]  haloperidol (HALDOL) 5 MG tablet Take 2 tablets (10 mg total) by mouth daily with supper. 10/25/20   Nwoko, Terese Door, PA  haloperidol decanoate (HALDOL DECANOATE) 50 MG/ML injection  06/05/19   [provider]  levETIRAcetam (KEPPRA) 750 MG tablet Take 1 tablet (750 mg total) by mouth 2 (two) times daily. 03/16/19   Marcial Pacas, MD  metFORMIN (GLUCOPHAGE) 500 MG tablet Take 500 mg by mouth daily. 06/10/19  [provider]  metFORMIN (GLUCOPHAGE-XR) 500 MG 24 hr tablet Take 250 mg by mouth 2 (two) times daily.     [provider]  polyethylene glycol (MIRALAX / GLYCOLAX) packet Take 17 g by mouth daily. Patient taking differently: Take 17 g by mouth daily as needed for mild constipation. 02/24/18   Thurnell Lose, MD  senna (SENOKOT) 8.6 MG tablet Take 2 tablets by mouth 2 (two) times daily.    [provider]  tizanidine (ZANAFLEX) 2 MG capsule Take 1 capsule (2 mg total) by mouth 3 (three) times daily. 06/21/19   Loura Halt A, NP  traZODone (DESYREL) 50 MG tablet Take 1 tablet (50 mg total) by mouth at bedtime as needed for sleep. 10/25/20   Nwoko, Terese Door, PA  valproic acid (DEPAKENE) 250 MG capsule Take 2 capsules every morning then take 4 capsules at bedtime Patient taking differently: Take 500-1,000 mg by mouth See admin instructions. Take 2 capsules (500 mg) by mouth every morning then take 4 capsules  (2000 mg) at bedtime 10/25/20   Malachy Mood, [place]    Physical Exam: Vitals:   10/28/20 2044 10/28/20 2128 10/28/20 2230 10/28/20 2315  BP: (!) 113/50 (!) 107/48 127/68 104/62  Pulse: 72 70 68 68  Resp: '18 18 16 16  '$ Temp:      TempSrc:      SpO2: 93%  95% 94% 97%  Weight:      Height:       Constitutional: Looks chronically ill.  Currently in NAD. Eyes: Equal, pinpoint miotic, lids and conjunctivae normal.  Sclera is mildly injected. ENMT: Mucous membranes and lips are dry.  Unable to examine oral mucosa fully or posterior pharynx due to the patient's lack of cooperation. Neck: normal, supple, no masses, no thyromegaly Respiratory: Decreased breath sounds in bases, otherwise clear to auscultation bilaterally, no wheezing, no crackles. Normal respiratory effort. No accessory muscle use.  Cardiovascular: Regular rate and rhythm, no murmurs / rubs / gallops. No extremity edema. 2+ pedal pulses. No carotid bruits.  Abdomen: No distention.  Bowel sounds positive.  Soft, no tenderness, no masses palpated. No hepatosplenomegaly. Musculoskeletal: no clubbing / cyanosis.  Good ROM, no contractures.  Mildly relaxed muscle tone.  Skin: no rashes, lesions, ulcers  Neurologic: Grossly nonfocal.  Unable to fully evaluate. Psychiatric: Does not respond to verbal stimuli.  Responds to light touch.  Unable to further evaluate.  Labs on Admission: I have personally reviewed following labs and imaging studies  CBC: Recent Labs  Lab 10/28/20 1418  WBC 6.7  NEUTROABS 4.1  HGB 10.8*  HCT 33.5*  MCV 91.8  PLT 117*    Basic Metabolic Panel: Recent Labs  Lab 10/28/20 1418  NA 138  K 3.2*  CL 106  CO2 24  GLUCOSE 145*  BUN 19  CREATININE 1.70*  CALCIUM 8.8*    GFR: Estimated Creatinine Clearance: 48.9 mL/min (A) (by C-G formula based on SCr of 1.7 mg/dL (H)).  Liver Function Tests: Recent Labs  Lab 10/28/20 1418  AST 44*  ALT 29  ALKPHOS 45  BILITOT 0.4  PROT 6.6  ALBUMIN 3.3*   Radiological Exams on Admission: CT Head Wo Contrast  Result Date: 10/28/2020 CLINICAL DATA:  Possible OD. Unsure of what happened but pt was given Narcan in the field as well as in the ED. Pt unresponsive with pinpoint pupils at time of test EXAM: CT  HEAD WITHOUT CONTRAST TECHNIQUE: Contiguous axial images were obtained from the base  of the skull through the vertex without intravenous contrast. COMPARISON:  05/02/2018 FINDINGS: Brain: No evidence of acute infarction, hemorrhage, hydrocephalus, extra-axial collection or mass lesion/mass effect. Vascular: No hyperdense vessel or unexpected calcification. Skull: Normal. Negative for fracture or focal lesion. Sinuses/Orbits: Globes and orbits are unremarkable. Opacified left frontal sinus, chronic and stable from the prior CT. Remaining sinuses are clear. Other: None. IMPRESSION: 1. No acute intracranial abnormalities. Electronically Signed   By: Lajean Manes M.D.   On: 10/28/2020 15:11   MR ANGIO HEAD WO CONTRAST  Result Date: 10/28/2020 CLINICAL DATA:  Transient ischemic attack EXAM: MRI HEAD WITHOUT AND WITH CONTRAST MRA HEAD WITHOUT CONTRAST MRA NECK WITHOUT CONTRAST TECHNIQUE: Multiplanar, multiecho pulse sequences of the brain and surrounding structures were obtained without and with intravenous contrast. Angiographic images of the Circle of Willis were obtained using MRA technique without intravenous contrast. Angiographic images of the neck were obtained using MRA technique without intravenous contrast. Carotid stenosis measurements (when applicable) are obtained utilizing NASCET criteria, using the distal internal carotid diameter as the denominator. CONTRAST:  8.53m GADAVIST GADOBUTROL 1 MMOL/ML IV SOLN COMPARISON:  None. FINDINGS: MRI HEAD FINDINGS Brain: No acute infarct, mass effect or extra-axial collection. No acute or chronic hemorrhage. Normal white matter signal, parenchymal volume and CSF spaces. The midline structures are normal. There is no abnormal contrast enhancement. Vascular: Major flow voids are preserved. Skull and upper cervical spine: Normal calvarium and skull base. Visualized upper cervical spine and soft tissues are normal. Sinuses/Orbits:Opacification of the left frontal sinus.  Normal orbits. MRA HEAD FINDINGS POSTERIOR CIRCULATION: --Vertebral arteries: Normal --Inferior cerebellar arteries: Normal. --Basilar artery: Normal. --Superior cerebellar arteries: Normal. --Posterior cerebral arteries: Normal. ANTERIOR CIRCULATION: --Intracranial internal carotid arteries: Normal. --Anterior cerebral arteries (ACA): Normal. --Middle cerebral arteries (MCA): Normal. ANATOMIC VARIANTS: Bilateral posterior communicating artery patency. MRA NECK FINDINGS Right dominant vertebral arteries. Diminutive left vertebral artery. Carotid systems are normal. IMPRESSION: 1. Normal MRI of the brain. 2. Normal MRA of the head and neck. Electronically Signed   By: KUlyses JarredM.D.   On: 10/28/2020 22:02   MR ANGIO NECK WO CONTRAST  Result Date: 10/28/2020 CLINICAL DATA:  Transient ischemic attack EXAM: MRI HEAD WITHOUT AND WITH CONTRAST MRA HEAD WITHOUT CONTRAST MRA NECK WITHOUT CONTRAST TECHNIQUE: Multiplanar, multiecho pulse sequences of the brain and surrounding structures were obtained without and with intravenous contrast. Angiographic images of the Circle of Willis were obtained using MRA technique without intravenous contrast. Angiographic images of the neck were obtained using MRA technique without intravenous contrast. Carotid stenosis measurements (when applicable) are obtained utilizing NASCET criteria, using the distal internal carotid diameter as the denominator. CONTRAST:  8.537mGADAVIST GADOBUTROL 1 MMOL/ML IV SOLN COMPARISON:  None. FINDINGS: MRI HEAD FINDINGS Brain: No acute infarct, mass effect or extra-axial collection. No acute or chronic hemorrhage. Normal white matter signal, parenchymal volume and CSF spaces. The midline structures are normal. There is no abnormal contrast enhancement. Vascular: Major flow voids are preserved. Skull and upper cervical spine: Normal calvarium and skull base. Visualized upper cervical spine and soft tissues are normal. Sinuses/Orbits:Opacification of  the left frontal sinus. Normal orbits. MRA HEAD FINDINGS POSTERIOR CIRCULATION: --Vertebral arteries: Normal --Inferior cerebellar arteries: Normal. --Basilar artery: Normal. --Superior cerebellar arteries: Normal. --Posterior cerebral arteries: Normal. ANTERIOR CIRCULATION: --Intracranial internal carotid arteries: Normal. --Anterior cerebral arteries (ACA): Normal. --Middle cerebral arteries (MCA): Normal. ANATOMIC VARIANTS: Bilateral posterior communicating artery patency. MRA NECK FINDINGS Right dominant vertebral arteries. Diminutive left vertebral artery. Carotid systems are normal.  IMPRESSION: 1. Normal MRI of the brain. 2. Normal MRA of the head and neck. Electronically Signed   By: Ulyses Jarred M.D.   On: 10/28/2020 22:02   MR BRAIN W WO CONTRAST  Result Date: 10/28/2020 CLINICAL DATA:  Transient ischemic attack EXAM: MRI HEAD WITHOUT AND WITH CONTRAST MRA HEAD WITHOUT CONTRAST MRA NECK WITHOUT CONTRAST TECHNIQUE: Multiplanar, multiecho pulse sequences of the brain and surrounding structures were obtained without and with intravenous contrast. Angiographic images of the Circle of Willis were obtained using MRA technique without intravenous contrast. Angiographic images of the neck were obtained using MRA technique without intravenous contrast. Carotid stenosis measurements (when applicable) are obtained utilizing NASCET criteria, using the distal internal carotid diameter as the denominator. CONTRAST:  8.79m GADAVIST GADOBUTROL 1 MMOL/ML IV SOLN COMPARISON:  None. FINDINGS: MRI HEAD FINDINGS Brain: No acute infarct, mass effect or extra-axial collection. No acute or chronic hemorrhage. Normal white matter signal, parenchymal volume and CSF spaces. The midline structures are normal. There is no abnormal contrast enhancement. Vascular: Major flow voids are preserved. Skull and upper cervical spine: Normal calvarium and skull base. Visualized upper cervical spine and soft tissues are normal.  Sinuses/Orbits:Opacification of the left frontal sinus. Normal orbits. MRA HEAD FINDINGS POSTERIOR CIRCULATION: --Vertebral arteries: Normal --Inferior cerebellar arteries: Normal. --Basilar artery: Normal. --Superior cerebellar arteries: Normal. --Posterior cerebral arteries: Normal. ANTERIOR CIRCULATION: --Intracranial internal carotid arteries: Normal. --Anterior cerebral arteries (ACA): Normal. --Middle cerebral arteries (MCA): Normal. ANATOMIC VARIANTS: Bilateral posterior communicating artery patency. MRA NECK FINDINGS Right dominant vertebral arteries. Diminutive left vertebral artery. Carotid systems are normal. IMPRESSION: 1. Normal MRI of the brain. 2. Normal MRA of the head and neck. Electronically Signed   By: KUlyses JarredM.D.   On: 10/28/2020 22:02   DG Chest Portable 1 View  Result Date: 10/28/2020 CLINICAL DATA:  Altered mental status. EXAM: PORTABLE CHEST 1 VIEW COMPARISON:  April 17, 2018. FINDINGS: The heart size and mediastinal contours are within normal limits. Minimal bibasilar subsegmental atelectasis is noted. The visualized skeletal structures are unremarkable. IMPRESSION: Minimal bibasilar subsegmental atelectasis. Electronically Signed   By: JMarijo ConceptionM.D.   On: 10/28/2020 14:36   DG Abd Portable 1V  Result Date: 10/28/2020 CLINICAL DATA:  Altered mental status, abdominal pain EXAM: PORTABLE ABDOMEN - 1 VIEW COMPARISON:  Radiograph 02/13/2018 FINDINGS: Diffusely air-filled appearance of both large and small bowel with a fairly sizable colonic stool burden noted as well. Loops of small bowel are at the upper limits of normal for caliber measuring up to 2.9 cm in diameter. Stable calcified phleboliths throughout the pelvis. Atelectatic changes noted in the lung bases. Mediastinal contours are unremarkable. Telemetry leads overlie the chest and abdomen. Degenerative changes seen in the spine, hips and pelvis. IMPRESSION: Diffusely air-filled appearance of both large and small  bowel with a fairly significant colonic stool burden. Could reflect some slowed intestinal transit versus a mild ileus. Obstruction is less favored. Electronically Signed   By: PLovena LeM.D.   On: 10/28/2020 19:42    EKG: Independently reviewed.  Vent. rate 81 BPM PR interval 186 ms QRS duration 98 ms QT/QTcB 373/433 ms P-R-T axes 53 59 25 Sinus rhythm  Assessment/Plan Principal Problem:   Acute encephalopathy Multiple admissions for similar episodes. Place in observation/PCU. Keep NPO. Frequent neurochecks. Continue IV fluids. CBG monitoring every 6 hours. Meningitis/encephalitis coverage per neurology recommendations.  Active Problems:   Seizure ([facility] Neuro recommendations appreciated. Continue Keppra and valproic acid.    Hypothermia Continue  warming measures.    HIV infection ([place]) Resume antiretrovirals once more alert.    Hyponatremia GI losses. Continue normal saline infusion.    Hypokalemia Replacing. Check magnesium level. Follow-up potassium level.    CKD (chronic kidney disease) stage 3, GFR 30-59 ml/min (HCC) Monitor renal function electrolytes.    Normocytic anemia Monitor hematocrit and hemoglobin. Decreased TIBC and increased ferritin on previous work-up.    Thrombocytopenia > 100 K (HCC) Main need IR for LP. Monitor platelet count. SCDs for DVT prophylaxis    GERD (gastroesophageal reflux disease) Convert famotidine to IV. Switch to oral formulation once more alert.    Schizoaffective disorder ([place]) Resume meds once more alert.     DVT prophylaxis: SCDs. Code Status:   Full code. Family Communication:  No NOK/relatives at bedside. Disposition Plan:   Patient is from:  Group home.  Anticipated DC to:  Group home.  Anticipated DC date:  10/30/2020.  Anticipated DC barriers: Pending work-up/consultant sign off.  Consults called:  Su Monks, MD (neuro hospitalist team). Admission status:  Observation/progressive  unit.  Severity of Illness:  High severity after presenting with acute encephalopathy of unknown etiology.  The patient will need to remain in the hospital for at least 24 to 48 hours for close neurological monitoring, medical treatment and further work-up.  Reubin Milan MD [facility]  How to contact the [facility] Attending or Consulting provider [place] or covering provider during after hours [place], for this patient?   Check the care team in [facility] and look for a) attending/consulting [facility] provider listed and b) the [facility] team listed Log into [email or web address] and use [facility]'s universal password to access. If you do not have the password, please contact the hospital operator. Locate the [facility] provider you are looking for under [facility] and page to a number that you can be directly reached. If you still have difficulty reaching the provider, please page the [facility] (Director on Call) for the Hospitalists listed on amion for assistance.  10/28/2020, 11:49 PM   This document was prepared in Dragon voice recognition software and may contain some unintended transcription errors.

## 2020-10-28 NOTE — ED Notes (Signed)
Received verbal report from [place] at this time

## 2020-10-28 NOTE — ED Notes (Signed)
Pt still has pinpoint pupils. Responsive to pain only. Sitting up at 90 degrees in bed because pt is drooling. Suctioning oral airway as appropriate.

## 2020-10-29 ENCOUNTER — Other Ambulatory Visit: Payer: Self-pay

## 2020-10-29 ENCOUNTER — Encounter (HOSPITAL_COMMUNITY): Payer: Self-pay | Admitting: Internal Medicine

## 2020-10-29 DIAGNOSIS — G9340 Encephalopathy, unspecified: Secondary | ICD-10-CM | POA: Diagnosis not present

## 2020-10-29 DIAGNOSIS — D696 Thrombocytopenia, unspecified: Secondary | ICD-10-CM | POA: Diagnosis not present

## 2020-10-29 DIAGNOSIS — B20 Human immunodeficiency virus [HIV] disease: Secondary | ICD-10-CM | POA: Diagnosis not present

## 2020-10-29 DIAGNOSIS — R569 Unspecified convulsions: Secondary | ICD-10-CM | POA: Diagnosis not present

## 2020-10-29 DIAGNOSIS — G9341 Metabolic encephalopathy: Secondary | ICD-10-CM | POA: Diagnosis not present

## 2020-10-29 DIAGNOSIS — F259 Schizoaffective disorder, unspecified: Secondary | ICD-10-CM

## 2020-10-29 DIAGNOSIS — N1831 Chronic kidney disease, stage 3a: Secondary | ICD-10-CM | POA: Diagnosis not present

## 2020-10-29 LAB — CBC
HCT: 33.2 % — ABNORMAL LOW (ref 39.0–52.0)
Hemoglobin: 10.4 g/dL — ABNORMAL LOW (ref 13.0–17.0)
MCH: 29.5 pg (ref 26.0–34.0)
MCHC: 31.3 g/dL (ref 30.0–36.0)
MCV: 94.1 fL (ref 80.0–100.0)
Platelets: 118 10*3/uL — ABNORMAL LOW (ref 150–400)
RBC: 3.53 MIL/uL — ABNORMAL LOW (ref 4.22–5.81)
RDW: 14.6 % (ref 11.5–15.5)
WBC: 6.8 10*3/uL (ref 4.0–10.5)
nRBC: 0 % (ref 0.0–0.2)

## 2020-10-29 LAB — BLOOD CULTURE ID PANEL (REFLEXED) - BCID2

## 2020-10-29 LAB — COMPREHENSIVE METABOLIC PANEL
ALT: 21 U/L (ref 0–44)
AST: 31 U/L (ref 15–41)
Albumin: 2.6 g/dL — ABNORMAL LOW (ref 3.5–5.0)
Alkaline Phosphatase: 40 U/L (ref 38–126)
Anion gap: 4 — ABNORMAL LOW (ref 5–15)
BUN: 13 mg/dL (ref 8–23)
CO2: 25 mmol/L (ref 22–32)
Calcium: 8 mg/dL — ABNORMAL LOW (ref 8.9–10.3)
Chloride: 115 mmol/L — ABNORMAL HIGH (ref 98–111)
Creatinine, Ser: 1.39 mg/dL — ABNORMAL HIGH (ref 0.61–1.24)
GFR, Estimated: 57 mL/min — ABNORMAL LOW (ref >60)
Glucose, Bld: 98 mg/dL (ref 70–99)
Potassium: 4 mmol/L (ref 3.5–5.1)
Sodium: 144 mmol/L (ref 135–145)
Total Bilirubin: 0.6 mg/dL (ref 0.3–1.2)
Total Protein: 5.6 g/dL — ABNORMAL LOW (ref 6.5–8.1)

## 2020-10-29 LAB — URINALYSIS, COMPLETE (UACMP) WITH MICROSCOPIC
Bilirubin Urine: NEGATIVE
Glucose, UA: NEGATIVE mg/dL
Ketones, ur: NEGATIVE mg/dL
Nitrite: NEGATIVE
Protein, ur: NEGATIVE mg/dL
Specific Gravity, Urine: 1.008 (ref 1.005–1.030)
WBC, UA: >50 WBC/hpf — ABNORMAL HIGH (ref 0–5)
pH: 6 (ref 5.0–8.0)

## 2020-10-29 LAB — RESP PANEL BY RT-PCR (FLU A&B, COVID) ARPGX2
Influenza A by PCR: NEGATIVE
Influenza B by PCR: NEGATIVE
SARS Coronavirus 2 by RT PCR: NEGATIVE

## 2020-10-29 MED ORDER — VALPROIC ACID 250 MG PO CAPS
500.0000 mg | ORAL_CAPSULE | Freq: Every day | ORAL | Status: DC
  Administered 2020-10-30 – 2020-10-31 (×2): 500 mg via ORAL
  Filled 2020-10-29 (×2): qty 2

## 2020-10-29 MED ORDER — SENNA 8.6 MG PO TABS
2.0000 | ORAL_TABLET | Freq: Every morning | ORAL | Status: DC
  Administered 2020-10-31: 17.2 mg via ORAL
  Filled 2020-10-29: qty 2

## 2020-10-29 MED ORDER — GABAPENTIN 300 MG PO CAPS: 300.0000 mg | ORAL_CAPSULE | Freq: Every morning | ORAL | Status: DC

## 2020-10-29 MED ORDER — FAMOTIDINE 20 MG PO TABS
20.0000 mg | ORAL_TABLET | Freq: Two times a day (BID) | ORAL | Status: DC
  Administered 2020-10-29 – 2020-10-31 (×4): 20 mg via ORAL
  Filled 2020-10-29 (×4): qty 1

## 2020-10-29 MED ORDER — CLOZAPINE 100 MG PO TABS: 100.0000 mg | ORAL_TABLET | ORAL | Status: DC

## 2020-10-29 MED ORDER — FOLIC ACID 1 MG PO TABS
1.0000 mg | ORAL_TABLET | Freq: Every morning | ORAL | Status: DC
  Administered 2020-10-30 – 2020-10-31 (×2): 1 mg via ORAL
  Filled 2020-10-29 (×2): qty 1

## 2020-10-29 MED ORDER — GABAPENTIN 400 MG PO CAPS
400.0000 mg | ORAL_CAPSULE | Freq: Every day | ORAL | Status: DC
  Administered 2020-10-29 – 2020-10-30 (×2): 400 mg via ORAL
  Filled 2020-10-29 (×2): qty 1

## 2020-10-29 MED ORDER — POTASSIUM CHLORIDE 2 MEQ/ML IV SOLN
INTRAVENOUS | Status: DC
  Filled 2020-10-29 (×7): qty 1000

## 2020-10-29 MED ORDER — BENZTROPINE MESYLATE 1 MG PO TABS
2.0000 mg | ORAL_TABLET | Freq: Two times a day (BID) | ORAL | Status: DC
  Administered 2020-10-29 – 2020-10-31 (×4): 2 mg via ORAL
  Filled 2020-10-29 (×4): qty 2

## 2020-10-29 MED ORDER — LEVETIRACETAM 750 MG PO TABS
750.0000 mg | ORAL_TABLET | Freq: Two times a day (BID) | ORAL | Status: DC
  Administered 2020-10-29 – 2020-10-31 (×4): 750 mg via ORAL
  Filled 2020-10-29 (×5): qty 1

## 2020-10-29 MED ORDER — HALOPERIDOL 5 MG PO TABS
10.0000 mg | ORAL_TABLET | Freq: Every day | ORAL | Status: DC
  Administered 2020-10-29 – 2020-10-30 (×2): 10 mg via ORAL
  Filled 2020-10-29 (×3): qty 2

## 2020-10-29 MED ORDER — BICTEGRAVIR-EMTRICITAB-TENOFOV 50-200-25 MG PO TABS
1.0000 | ORAL_TABLET | Freq: Every day | ORAL | Status: DC
  Administered 2020-10-29 – 2020-10-31 (×3): 1 via ORAL
  Filled 2020-10-29 (×3): qty 1

## 2020-10-29 MED ORDER — GABAPENTIN 400 MG PO CAPS
700.0000 mg | ORAL_CAPSULE | Freq: Every day | ORAL | Status: DC
  Administered 2020-10-30 – 2020-10-31 (×2): 700 mg via ORAL
  Filled 2020-10-29 (×2): qty 1

## 2020-10-29 MED ORDER — ATORVASTATIN CALCIUM 10 MG PO TABS
20.0000 mg | ORAL_TABLET | Freq: Every morning | ORAL | Status: DC
  Administered 2020-10-30 – 2020-10-31 (×2): 20 mg via ORAL
  Filled 2020-10-29 (×2): qty 2

## 2020-10-29 MED ORDER — VALPROIC ACID 250 MG PO CAPS
1000.0000 mg | ORAL_CAPSULE | Freq: Every day | ORAL | Status: DC
  Administered 2020-10-29 – 2020-10-30 (×2): 1000 mg via ORAL
  Filled 2020-10-29 (×3): qty 4

## 2020-10-29 MED ORDER — ASPIRIN EC 81 MG PO TBEC
81.0000 mg | DELAYED_RELEASE_TABLET | Freq: Every morning | ORAL | Status: DC
  Administered 2020-10-30 – 2020-10-31 (×2): 81 mg via ORAL
  Filled 2020-10-29 (×2): qty 1

## 2020-10-29 MED ORDER — GABAPENTIN 400 MG PO CAPS: 400.0000 mg | ORAL_CAPSULE | Freq: Two times a day (BID) | ORAL | Status: DC

## 2020-10-29 NOTE — ED Notes (Signed)
Pt sleeping, arousable to voice, mentions hunger, given/eating breakfast meal. Denies pain. Denies questions. Alert, NAD, calm, interactive, resps e/u, speak clearly.

## 2020-10-29 NOTE — ED Notes (Signed)
Requested depacon from pharmacy

## 2020-10-29 NOTE — Procedures (Addendum)
Patient Name: ASMAR CERTO  MRN: [id number]  Epilepsy Attending: Lora Havens  Referring Physician/Provider: Dr Su Monks Duration: 10/28/2020 1814 to 10/29/2020 1236  Patient history: 60yo M with ams. EEG to evaluate for seizure  Level of alertness: Awake, asleep  AEDs during EEG study: LEV, VPA  Technical aspects: This EEG study was done with scalp electrodes positioned according to the 10-20 International system of electrode placement. Electrical activity was acquired at a sampling rate of '500Hz'$  and reviewed with a high frequency filter of '70Hz'$  and a low frequency filter of '1Hz'$ . EEG data were recorded continuously and digitally stored.   Description: The posterior dominant rhythm consists of 8 Hz activity of moderate voltage (25-35 uV) seen predominantly in posterior head regions, symmetric and reactive to eye opening and eye closing. Sleep was characterized by vertex waves, sleep spindles (12 to 14 Hz), maximal frontocentral region. EEG showed intermittent 3-'5hz'$  theta-delta slowing. Hyperventilation and photic stimulation were not performed.     ABNORMALITY - Intermittent slow, generalized  IMPRESSION: This study is suggestive of mild diffuse encephalopathy, nonspecific etiology. No seizures or epileptiform discharges were seen throughout the recording.  Tanaiya Kolarik Barbra Sarks

## 2020-10-29 NOTE — ED Notes (Signed)
Provided pt with a [place] sandwich at this time. Pt awake and able to answer questions and is requesting to watch tv at this time

## 2020-10-29 NOTE — ED Notes (Signed)
Pt provided a soda and apple sauce at this time

## 2020-10-29 NOTE — ED Notes (Addendum)
Pt repositioned, linens and gown changed, condom cath foley intact & emptied, IVs flushed.

## 2020-10-29 NOTE — Progress Notes (Signed)
MEDICATION RELATED CONSULT NOTE - INITIAL   Pharmacy Consult for Clozapine Indication: Schizoprenia  No Known Allergies  Patient Measurements: Height: '5\' 9"'$  (175.3 cm) Weight: 85.7 kg (188 lb 15 oz) IBW/kg (Calculated) : 70.7  Vital Signs: Temp: 97.6 F (36.4 C) (07/24 1307) BP: 115/66 (07/24 1454) Pulse Rate: 80 (07/24 1454) Intake/Output from previous day: 07/23 0701 - 07/24 0700 In: 3147.1 [IV Piggyback:3147.1] Out: 1000 [Urine:1000] Intake/Output from this shift: No intake/output data recorded.  Labs: Recent Labs    10/28/20 1418 10/29/20 0222  WBC 6.7 6.8  HGB 10.8* 10.4*  HCT 33.5* 33.2*  PLT 117* 118*  CREATININE 1.70* 1.39*  ALBUMIN 3.3* 2.6*  PROT 6.6 5.6*  AST 44* 31  ALT 29 21  ALKPHOS 45 40  BILITOT 0.4 0.6   Estimated Creatinine Clearance: 59.8 mL/min (A) (by C-G formula based on SCr of 1.39 mg/dL (H)).   Microbiology: Recent Results (from the past 720 hour(s))  Blood culture (routine x 2)     Status: None (Preliminary result)   Collection Time: 10/28/20  5:10 PM   Specimen: BLOOD LEFT FOREARM  Result Value Ref Range Status   Specimen Description BLOOD LEFT FOREARM  Final   Special Requests   Final    BOTTLES DRAWN AEROBIC AND ANAEROBIC Blood Culture adequate volume   Culture  Setup Time   Final    GRAM POSITIVE COCCI AEROBIC BOTTLE ONLY CRITICAL RESULT CALLED TO, READ BACK BY AND VERIFIED WITH: Claud Kelp ON [id number] AT [id number] BY E.PARRISH Performed at [facility], [place] [address], [place], [place] [address]    Culture GRAM POSITIVE COCCI  Final   Report Status PENDING  Incomplete  Blood Culture ID Panel (Reflexed)     Status: Abnormal   Collection Time: 10/28/20  5:10 PM  Result Value Ref Range Status   Enterococcus faecalis NOT DETECTED NOT DETECTED Final   Enterococcus Faecium NOT DETECTED NOT DETECTED Final   Listeria monocytogenes NOT DETECTED NOT DETECTED Final   Staphylococcus species DETECTED (A) NOT DETECTED Final     Comment: CRITICAL RESULT CALLED TO, READ BACK BY AND VERIFIED WITH: Vena Austria E.Isys Tietje ON [id number] AT 1714 BY E.PARRISH    Staphylococcus aureus (BCID) NOT DETECTED NOT DETECTED Final   Staphylococcus epidermidis NOT DETECTED NOT DETECTED Final   Staphylococcus lugdunensis NOT DETECTED NOT DETECTED Final   Streptococcus species NOT DETECTED NOT DETECTED Final   Streptococcus agalactiae NOT DETECTED NOT DETECTED Final   Streptococcus pneumoniae NOT DETECTED NOT DETECTED Final   Streptococcus pyogenes NOT DETECTED NOT DETECTED Final   A.calcoaceticus-baumannii NOT DETECTED NOT DETECTED Final   Bacteroides fragilis NOT DETECTED NOT DETECTED Final   Enterobacterales NOT DETECTED NOT DETECTED Final   Enterobacter cloacae complex NOT DETECTED NOT DETECTED Final   Escherichia coli NOT DETECTED NOT DETECTED Final   Klebsiella aerogenes NOT DETECTED NOT DETECTED Final   Klebsiella oxytoca NOT DETECTED NOT DETECTED Final   Klebsiella pneumoniae NOT DETECTED NOT DETECTED Final   Proteus species NOT DETECTED NOT DETECTED Final   Salmonella species NOT DETECTED NOT DETECTED Final   Serratia marcescens NOT DETECTED NOT DETECTED Final   Haemophilus influenzae NOT DETECTED NOT DETECTED Final   Neisseria meningitidis NOT DETECTED NOT DETECTED Final   Pseudomonas aeruginosa NOT DETECTED NOT DETECTED Final   Stenotrophomonas maltophilia NOT DETECTED NOT DETECTED Final   Candida albicans NOT DETECTED NOT DETECTED Final   Candida auris NOT DETECTED NOT DETECTED Final   Candida glabrata NOT DETECTED  NOT DETECTED Final   Candida krusei NOT DETECTED NOT DETECTED Final   Candida parapsilosis NOT DETECTED NOT DETECTED Final   Candida tropicalis NOT DETECTED NOT DETECTED Final   Cryptococcus neoformans/gattii NOT DETECTED NOT DETECTED Final    Comment: Performed at [facility], [place] [address] [place], [place] [address]  Blood culture (routine x 2)     Status: None (Preliminary result)   Collection Time:  10/28/20  5:15 PM   Specimen: BLOOD  Result Value Ref Range Status   Specimen Description BLOOD LEFT ANTECUBITAL  Final   Special Requests   Final    BOTTLES DRAWN AEROBIC AND ANAEROBIC Blood Culture adequate volume   Culture   Final    NO GROWTH < 24 HOURS Performed at [facility], [place] [address], [place], [place] [address]    Report Status PENDING  Incomplete  Resp Panel by RT-PCR (Flu A&B, Covid) Nasopharyngeal Swab     Status: None   Collection Time: 10/29/20 12:15 AM   Specimen: Nasopharyngeal Swab; Nasopharyngeal(NP) swabs in vial transport medium  Result Value Ref Range Status   SARS Coronavirus 2 by RT PCR NEGATIVE NEGATIVE Final    Comment: (NOTE) SARS-CoV-2 target nucleic acids are NOT DETECTED.  The SARS-CoV-2 RNA is generally detectable in upper respiratory specimens during the acute phase of infection. The lowest concentration of SARS-CoV-2 viral copies this assay can detect is 138 copies/mL. A negative result does not preclude SARS-Cov-2 infection and should not be used as the sole basis for treatment or other patient management decisions. A negative result may occur with  improper specimen collection/handling, submission of specimen other than nasopharyngeal swab, presence of viral mutation(s) within the areas targeted by this assay, and inadequate number of viral copies(<138 copies/mL). A negative result must be combined with clinical observations, patient history, and epidemiological information. The expected result is Negative.  Fact Sheet for Patients:  EntrepreneurPulse.com.au  Fact Sheet for Healthcare Providers:  IncredibleEmployment.be  This test is no t yet approved or cleared by the [place] FDA and  has been authorized for detection and/or diagnosis of SARS-CoV-2 by FDA under an Emergency Use Authorization (EUA). This EUA will remain  in effect (meaning this test can be used) for the duration of  the COVID-19 declaration under Section 564(b)(1) of the Act, 21 U.S.C.section 360bbb-3(b)(1), unless the authorization is terminated  or revoked sooner.       Influenza A by PCR NEGATIVE NEGATIVE Final   Influenza B by PCR NEGATIVE NEGATIVE Final    Comment: (NOTE) The Xpert Xpress SARS-CoV-2/FLU/RSV plus assay is intended as an aid in the diagnosis of influenza from Nasopharyngeal swab specimens and should not be used as a sole basis for treatment. Nasal washings and aspirates are unacceptable for Xpert Xpress SARS-CoV-2/FLU/RSV testing.  Fact Sheet for Patients: EntrepreneurPulse.com.au  Fact Sheet for Healthcare Providers: IncredibleEmployment.be  This test is not yet approved or cleared by the [place] FDA and has been authorized for detection and/or diagnosis of SARS-CoV-2 by FDA under an Emergency Use Authorization (EUA). This EUA will remain in effect (meaning this test can be used) for the duration of the COVID-19 declaration under Section 564(b)(1) of the Act, 21 U.S.C. section 360bbb-3(b)(1), unless the authorization is terminated or revoked.  Performed at [facility], [place] [address], [place], [place] [address]     Medical History: Past Medical History:  Diagnosis Date   Anemia    Anemia    Anxiety  Bipolar 1 disorder (HCC)    Depression, major    HIV (human immunodeficiency virus infection) ([place])    Hyperglycemia 08/06/2017   Hypernatremia    Kidney failure    Lithium toxicity    MR (mental retardation)    Schizoaffective disorder ([place]) 08/06/2017   Schizophrenia (HCC)    Seizures (HCC)     Medications:  Scheduled:   [START ON 10/30/2020] aspirin EC  81 mg Oral q morning   [START ON 10/30/2020] atorvastatin  20 mg Oral q morning   benztropine  2 mg Oral BID   bictegravir-emtricitabine-tenofovir AF  1 tablet Oral Daily   famotidine  20 mg Oral BID   [START ON 123456 folic acid  1 mg Oral q morning    gabapentin  400 mg Oral QHS   [START ON 10/30/2020] gabapentin  700 mg Oral Daily   haloperidol  10 mg Oral Q supper   levETIRAcetam  750 mg Oral BID   [START ON 10/30/2020] senna  2 tablet Oral q morning   valproic acid  1,000 mg Oral QHS   [START ON 10/30/2020] valproic acid  500 mg Oral Daily    Assessment: 60 YOM admitted from group home on 7/23 with AMS. The patient has history of schizophrenia. The patient was last seen by psych on 7/20 who noted the following:   "Per caregiver, patient is normally calm and pleasant.  Patient also has a history of being on Clozaril since at least 2018.  Patient has recently been off Clozaril and his other psychiatric medications after the psychiatric provider for their facility ended up leaving.  Patient has a history of taking the following psychiatric medications:  Benztropine 2 mg 2 times daily Clozaril 100 mg at bedtime Valproic acid 250 mg 4 times at bedtime Haldol 5 mg 2 times daily  At completion of that visit the PA Ileene Musa) stated that the patient was to be enrolled in the Clozapine REMS program, no prescription was sent and/or filled for Clozapine at that time. The patient was initiated and/or re-initiated on cogentin 2 mg bid, valproic acid 500 qam and 1g qpm, haldol 10 mg, trazodone prn sleep.  Upon review of the REMS registry this evening - I did not see that the patient was currently enrolled and/or registered. If his old psych provider has left, he will likely require re-enrollment under a new provider. Under these circumstances, I don't feel it is safe to resume Clozapine until the patient is re-enrolled in the REMS program.   Plan:  - Hold Clozapine re-initiation  - Will leave follow-up for pharmacy to attempt to clarify with provider (last seen by Ileene Musa on 7/20) at [facility] in the AM regarding enrollment in the REMS program and plan for Clozapine.  - Will also leave follow-up to discuss  action plans with Dr. Roderic Palau in the AM  Thank you for allowing pharmacy to be a part of this patient's care.  Alycia Rossetti, PharmD, BCPS Clinical Pharmacist Clinical phone for 10/29/2020: [phone] 10/29/2020 9:12 PM   **Pharmacist phone directory can now be found on [email or web address] (PW [email or web address]).  Listed under [place].

## 2020-10-29 NOTE — Progress Notes (Signed)
Pt. Vomited x1. Prn Zofran given.

## 2020-10-29 NOTE — ED Notes (Signed)
Given apple sauce and juice per request

## 2020-10-29 NOTE — ED Notes (Signed)
[phone] Ms.Kurian personal number who works at the facility pt is from wanted a update and she also gave me the facilities number [phone]

## 2020-10-29 NOTE — ED Notes (Signed)
Admitting MD at [facility].  

## 2020-10-29 NOTE — ED Notes (Signed)
[place], [phone], [phone]. Mentions pt "Can't talk well, can't self report, developmentally delayed."

## 2020-10-29 NOTE — Progress Notes (Addendum)
Neurology Progress Note  Patient ID: MICHAL BENESH is a 60 y.o. with PMHx of  has a past medical history of Anemia, Anemia, Anxiety, Bipolar 1 disorder ([place]), Depression, major, HIV (human immunodeficiency virus infection) ([place]), Hyperglycemia (08/06/2017), Hypernatremia, Kidney failure, Lithium toxicity, MR (mental retardation), Schizoaffective disorder ([place]) (08/06/2017), Schizophrenia ([place]), and Seizures ([place]).  Initially consulted for: altered mental status    Major interval events:  EEG shows diffuse encephalopathy. No seizure discharges seen 10/28/2020  Subjective: Patient reports feeling better. Hungry.   Exam: Vitals:   10/29/20 0515 10/29/20 0615  BP: 135/68 133/85  Pulse: 77 74  Resp: 16 17  Temp:    SpO2: 98% 98%   Gen: In bed, comfortable. Lying down in bed. Had two breakfast trays. Looks much better than prior evaluation.  Resp: non-labored breathing, no grossly audible wheezing Cardiac: Perfusing extremities well  Abd: soft, nt  Neuro: MS:  awake, alert, oriented to person, place, not time. Pleasant and cooperative CN: Mental status/Cognition: Alert, oriented to self, place, month and year, good attention. Speech/language: Fluent, comprehension intact, object naming intact, repetition intact.  Cranial nerves:   CN II Pupils equal and reactive to light, no VF deficits   CN III,IV,VI EOM intact, no gaze preference or deviation, no nystagmus   CN V normal sensation in V1, V2, and V3 segments bilaterally   CN VII no asymmetry, no nasolabial fold flattening   CN VIII normal hearing to speech   CN IX & X normal palatal elevation, no uvular deviation   CN XI 5/5 head turn and 5/5 shoulder shrug bilaterally   CN XII midline tongue protrusion    Motor: patient moves all extremities to command, 5/5 strength throughout Sensory:intact [id number]   Pertinent Labs:  Results for ALANZO, GARRETTE (MRN [id number]) as of 10/29/2020 09:47  Ref. Range 10/29/2020 01:37   Appearance Latest Ref Range: CLEAR  CLOUDY (A)  Bilirubin Urine Latest Ref Range: NEGATIVE  NEGATIVE  Color, Urine Latest Ref Range: YELLOW  YELLOW  Glucose, UA Latest Ref Range: NEGATIVE mg/dL NEGATIVE  Hgb urine dipstick Latest Ref Range: NEGATIVE  SMALL (A)  Ketones, ur Latest Ref Range: NEGATIVE mg/dL NEGATIVE  Leukocytes,Ua Latest Ref Range: NEGATIVE  LARGE (A)  Nitrite Latest Ref Range: NEGATIVE  NEGATIVE  pH Latest Ref Range: 5.0 - 8.0  6.0  Protein Latest Ref Range: NEGATIVE mg/dL NEGATIVE  Specific Gravity, Urine Latest Ref Range: 1.005 - 1.030  1.008  Bacteria, UA Latest Ref Range: NONE SEEN  FEW (A)  RBC / HPF Latest Ref Range: 0 - 5 RBC/hpf 6-10  WBC Clumps Unknown PRESENT  WBC, UA Latest Ref Range: 0 - 5 WBC/hpf >50 (H)  Results for ABDIHAKIM, THORSEN (MRN [id number]) as of 10/29/2020 09:47  Ref. Range 10/29/2020 01:37  Appearance Latest Ref Range: CLEAR  CLOUDY (A)  Bilirubin Urine Latest Ref Range: NEGATIVE  NEGATIVE  Color, Urine Latest Ref Range: YELLOW  YELLOW  Glucose, UA Latest Ref Range: NEGATIVE mg/dL NEGATIVE  Hgb urine dipstick Latest Ref Range: NEGATIVE  SMALL (A)  Ketones, ur Latest Ref Range: NEGATIVE mg/dL NEGATIVE  Leukocytes,Ua Latest Ref Range: NEGATIVE  LARGE (A)  Nitrite Latest Ref Range: NEGATIVE  NEGATIVE  pH Latest Ref Range: 5.0 - 8.0  6.0  Protein Latest Ref Range: NEGATIVE mg/dL NEGATIVE  Specific Gravity, Urine Latest Ref Range: 1.005 - 1.030  1.008  Bacteria, UA Latest Ref Range: NONE SEEN  FEW (A)  RBC / HPF Latest Ref Range: 0 -  5 RBC/hpf 6-10  WBC Clumps Unknown PRESENT  WBC, UA Latest Ref Range: 0 - 5 WBC/hpf >50 (H)   MRI Brain 1. Normal MRI of the brain. 2. Normal MRA of the head and neck.    Impression: 60 year old male with history outlined above presented for altered mental status. Exam much improved today. Neuroimaging is unremarkable. EEG shows encephalopathy but no seizure activity.  Urinalysis shows  UTI.   Recommendations: - no further neurologic workup indicated and we will sign off at this time.  -discontinue EEG.  -Ok to transition from IV to oral AED, if okay with admitting team. -management of medical issues per admitting team.  -We remain available to assist.   Neurology Attending Attestation   I examined the patient, discussed the plan with Ms. Cecile Hearing NP and have edited the above note to reflect my findings and recommendations.   Patient is doing very well, sitting up, making jokes, asking for food. States he feels back to himself. Given resolution of patient's encephalopathy suspect presentation 2/2 UTI. No indication for LP at this time. Will d/c acyclovir, vanc, and ampicillin and continue cefepime for UTI. Primary team can tailor abx as appropriate. We will not continue to actively follow, but please re-engage if additional neurologic concerns arise.  Su Monks, MD [facility] [phone]  If 7pm- 7am, please page neurology on call as listed in [place].

## 2020-10-29 NOTE — ED Notes (Addendum)
Facility called for update. [place] admin for the facility her cell number [phone] facility number [phone]

## 2020-10-29 NOTE — Plan of Care (Signed)
  Problem: Clinical Measurements: Goal: Respiratory complications will improve Outcome: Progressing   Problem: Clinical Measurements: Goal: Cardiovascular complication will be avoided Outcome: Progressing   Problem: Coping: Goal: Level of anxiety will decrease Outcome: Progressing   Problem: Pain Managment: Goal: General experience of comfort will improve Outcome: Progressing   

## 2020-10-29 NOTE — Progress Notes (Signed)
PROGRESS NOTE    Nathan [name]  [id number] DOB: 01/20/1961 DOA: 10/28/2020 PCP: Javier Docker, MD    Brief Narrative:  60 y/o male with history of schizoaffective disorder, HIV, seizure disorder, who resides at group home, brought to the hospital with decreased LOC. There was concern for underlying infection so he was started on broad spectrum antibiotics. Neurology following. LP attempted but was unsuccessful. Clinically, patient improved quickly and became more alert the following day. It was felt that symptoms may be related to UTI, so antibiotics de-escalated.    Assessment & Plan:   Principal Problem:   Acute encephalopathy Active Problems:   HIV infection (HCC)   Hyponatremia   CKD (chronic kidney disease) stage 3, GFR 30-59 ml/min (HCC)   GERD (gastroesophageal reflux disease)   Seizure (HCC)   Schizoaffective disorder (HCC)   Hypothermia   Hypokalemia   Normocytic anemia   Thrombocytopenia (HCC)   Acute metabolic encephalopathy -possibly related to UTI -initial concerns for possible meningitis/encephalitis. LP attempted in ED but unsuccessful -started on empiric therapy -Neurology following -patient significantly improved today -discussed with neurology and it was felt reasonable to de-escalate antibiotics and observe -will discontinue vancomycin, ampicillin and acyclovir -holding off on LP for now since symptoms may be related to UTI -continue cefepime for now -follow up cultures -EEG negative -MRI imaging of head negative -UDS negative  HIV -resume antiretrovirals once med rec completed by pharmacy  Seizure disorder -chronically on keppra and valproic acid -currently on IV therapy -transition back to PO once med rec completed and doses confirmed  Schizoaffective disorder -appears he is on clozapine and haldol -will resume when doses confirmed on med rec  Hypokalemia -resolved  AKI on CKD stage 3a -admitted with creatinine of  1.7 -improved to 1.3 with IV fluids 60 y-continue to monitor  Thrombocytopenia -chronic  -possibly related to HIV -currently at baseline  GERD -continue on famotidine  Dispo -PT/OT   DVT prophylaxis: SCDs Start: 10/28/20 2345  Code Status: full code Family Communication: no family present Disposition Plan: Status is: Observation  The patient remains OBS appropriate and will d/c before 2 midnights.  Dispo: The patient is from: Group home              Anticipated d/c is to: Group home              Patient currently is not medically stable to d/c.   Difficult to place patient No         Consultants:  Neurology  Procedures:  EEG: no epileptiform discharges or seizures seen  Antimicrobials:  Cefepime 7/23> Vancomycin 7/23> Acyclovir 7/23> Ampicillin 7/23>    Subjective: Awake, answer questions, following commands  Objective: Vitals:   10/29/20 0900 10/29/20 0915 10/29/20 0930 10/29/20 0945  BP: 128/71 136/88 135/85 (!) 153/75  Pulse: 80 81 71 70  Resp: 18 17 (!) 21 (!) 23  Temp:      TempSrc:      SpO2: 99% 99% 98% 99%  Weight:      Height:        Intake/Output Summary (Last 24 hours) at 10/29/2020 1111 Last data filed at 10/29/2020 1051 Gross per 24 hour  Intake 3197.11 ml  Output 1000 ml  Net 2197.11 ml   Filed Weights   10/28/20 1357  Weight: 85.7 kg    Examination:  General exam: Appears calm and comfortable  Respiratory system: Clear to auscultation. Respiratory effort normal. Cardiovascular system: S1 & S2 heard, RRR. No  JVD, murmurs, rubs, gallops or clicks. No pedal edema. Gastrointestinal system: Abdomen is nondistended, soft and nontender. No organomegaly or masses felt. Normal bowel sounds heard. Central nervous system: No focal neurological deficits. Extremities: Symmetric 5 x 5 power. Skin: No rashes, lesions or ulcers Psychiatry: awake, he knows he is in the hospital, pleasant, unclear baseline    Data Reviewed: I have  personally reviewed following labs and imaging studies  CBC: Recent Labs  Lab 10/28/20 1418 10/29/20 0222  WBC 6.7 6.8  NEUTROABS 4.1  --   HGB 10.8* 10.4*  HCT 33.5* 33.2*  MCV 91.8 94.1  PLT 117* 123456*   Basic Metabolic Panel: Recent Labs  Lab 10/28/20 1418 10/29/20 0222  NA 138 144  K 3.2* 4.0  CL 106 115*  CO2 24 25  GLUCOSE 145* 98  BUN 19 13  CREATININE 1.70* 1.39*  CALCIUM 8.8* 8.0*   GFR: Estimated Creatinine Clearance: 59.8 mL/min (A) (by C-G formula based on SCr of 1.39 mg/dL (H)). Liver Function Tests: Recent Labs  Lab 10/28/20 1418 10/29/20 0222  AST 44* 31  ALT 29 21  ALKPHOS 45 40  BILITOT 0.4 0.6  PROT 6.6 5.6*  ALBUMIN 3.3* 2.6*   No results for input(s): LIPASE, AMYLASE in the last 168 hours. Recent Labs  Lab 10/28/20 1418  AMMONIA 33   Coagulation Profile: No results for input(s): INR, PROTIME in the last 168 hours. Cardiac Enzymes: No results for input(s): CKTOTAL, CKMB, CKMBINDEX, TROPONINI in the last 168 hours. BNP (last 3 results) No results for input(s): PROBNP in the last 8760 hours. HbA1C: No results for input(s): HGBA1C in the last 72 hours. CBG: Recent Labs  Lab 10/28/20 1358  GLUCAP 143*   Lipid Profile: No results for input(s): CHOL, HDL, LDLCALC, TRIG, CHOLHDL, LDLDIRECT in the last 72 hours. Thyroid Function Tests: No results for input(s): TSH, T4TOTAL, FREET4, T3FREE, THYROIDAB in the last 72 hours. Anemia Panel: No results for input(s): VITAMINB12, FOLATE, FERRITIN, TIBC, IRON, RETICCTPCT in the last 72 hours. Sepsis Labs: Recent Labs  Lab 10/28/20 1418 10/28/20 1552  LATICACIDVEN 1.7 1.6    Recent Results (from the past 240 hour(s))  Blood culture (routine x 2)     Status: None (Preliminary result)   Collection Time: 10/28/20  5:10 PM   Specimen: BLOOD LEFT FOREARM  Result Value Ref Range Status   Specimen Description BLOOD LEFT FOREARM  Final   Special Requests   Final    BOTTLES DRAWN AEROBIC AND  ANAEROBIC Blood Culture adequate volume   Culture   Final    NO GROWTH < 24 HOURS Performed at [facility], [place] [address], [place], [place] [address]    Report Status PENDING  Incomplete  Blood culture (routine x 2)     Status: None (Preliminary result)   Collection Time: 10/28/20  5:15 PM   Specimen: BLOOD  Result Value Ref Range Status   Specimen Description BLOOD LEFT ANTECUBITAL  Final   Special Requests   Final    BOTTLES DRAWN AEROBIC AND ANAEROBIC Blood Culture adequate volume   Culture   Final    NO GROWTH < 24 HOURS Performed at [facility], [place] [address], [place], [place] [address]    Report Status PENDING  Incomplete  Resp Panel by RT-PCR (Flu A&B, Covid) Nasopharyngeal Swab     Status: None   Collection Time: 10/29/20 12:15 AM   Specimen: Nasopharyngeal Swab; Nasopharyngeal(NP) swabs in vial transport medium  Result  Value Ref Range Status   SARS Coronavirus 2 by RT PCR NEGATIVE NEGATIVE Final    Comment: (NOTE) SARS-CoV-2 target nucleic acids are NOT DETECTED.  The SARS-CoV-2 RNA is generally detectable in upper respiratory specimens during the acute phase of infection. The lowest concentration of SARS-CoV-2 viral copies this assay can detect is 138 copies/mL. A negative result does not preclude SARS-Cov-2 infection and should not be used as the sole basis for treatment or other patient management decisions. A negative result may occur with  improper specimen collection/handling, submission of specimen other than nasopharyngeal swab, presence of viral mutation(s) within the areas targeted by this assay, and inadequate number of viral copies(<138 copies/mL). A negative result must be combined with clinical observations, patient history, and epidemiological information. The expected result is Negative.  Fact Sheet for Patients:  EntrepreneurPulse.com.au  Fact Sheet for Healthcare Providers:   IncredibleEmployment.be  This test is no t yet approved or cleared by the [place] FDA and  has been authorized for detection and/or diagnosis of SARS-CoV-2 by FDA under an Emergency Use Authorization (EUA). This EUA will remain  in effect (meaning this test can be used) for the duration of the COVID-19 declaration under Section 564(b)(1) of the Act, 21 U.S.C.section 360bbb-3(b)(1), unless the authorization is terminated  or revoked sooner.       Influenza A by PCR NEGATIVE NEGATIVE Final   Influenza B by PCR NEGATIVE NEGATIVE Final    Comment: (NOTE) The Xpert Xpress SARS-CoV-2/FLU/RSV plus assay is intended as an aid in the diagnosis of influenza from Nasopharyngeal swab specimens and should not be used as a sole basis for treatment. Nasal washings and aspirates are unacceptable for Xpert Xpress SARS-CoV-2/FLU/RSV testing.  Fact Sheet for Patients: EntrepreneurPulse.com.au  Fact Sheet for Healthcare Providers: IncredibleEmployment.be  This test is not yet approved or cleared by the [place] FDA and has been authorized for detection and/or diagnosis of SARS-CoV-2 by FDA under an Emergency Use Authorization (EUA). This EUA will remain in effect (meaning this test can be used) for the duration of the COVID-19 declaration under Section 564(b)(1) of the Act, 21 U.S.C. section 360bbb-3(b)(1), unless the authorization is terminated or revoked.  Performed at [facility], [place] [address] [place], [place] [address]          Radiology Studies: CT Head Wo Contrast  Result Date: 10/28/2020 CLINICAL DATA:  Possible OD. Unsure of what happened but pt was given Narcan in the field as well as in the ED. Pt unresponsive with pinpoint pupils at time of test EXAM: CT HEAD WITHOUT CONTRAST TECHNIQUE: Contiguous axial images were obtained from the base of the skull through the vertex without intravenous contrast.  COMPARISON:  05/02/2018 FINDINGS: Brain: No evidence of acute infarction, hemorrhage, hydrocephalus, extra-axial collection or mass lesion/mass effect. Vascular: No hyperdense vessel or unexpected calcification. Skull: Normal. Negative for fracture or focal lesion. Sinuses/Orbits: Globes and orbits are unremarkable. Opacified left frontal sinus, chronic and stable from the prior CT. Remaining sinuses are clear. Other: None. IMPRESSION: 1. No acute intracranial abnormalities. Electronically Signed   By: Lajean Manes M.D.   On: 10/28/2020 15:11   MR ANGIO HEAD WO CONTRAST  Result Date: 10/28/2020 CLINICAL DATA:  Transient ischemic attack EXAM: MRI HEAD WITHOUT AND WITH CONTRAST MRA HEAD WITHOUT CONTRAST MRA NECK WITHOUT CONTRAST TECHNIQUE: Multiplanar, multiecho pulse sequences of the brain and surrounding structures were obtained without and with intravenous contrast. Angiographic images of the Circle of Willis were obtained using MRA  technique without intravenous contrast. Angiographic images of the neck were obtained using MRA technique without intravenous contrast. Carotid stenosis measurements (when applicable) are obtained utilizing NASCET criteria, using the distal internal carotid diameter as the denominator. CONTRAST:  8.66m GADAVIST GADOBUTROL 1 MMOL/ML IV SOLN COMPARISON:  None. FINDINGS: MRI HEAD FINDINGS Brain: No acute infarct, mass effect or extra-axial collection. No acute or chronic hemorrhage. Normal white matter signal, parenchymal volume and CSF spaces. The midline structures are normal. There is no abnormal contrast enhancement. Vascular: Major flow voids are preserved. Skull and upper cervical spine: Normal calvarium and skull base. Visualized upper cervical spine and soft tissues are normal. Sinuses/Orbits:Opacification of the left frontal sinus. Normal orbits. MRA HEAD FINDINGS POSTERIOR CIRCULATION: --Vertebral arteries: Normal --Inferior cerebellar arteries: Normal. --Basilar artery:  Normal. --Superior cerebellar arteries: Normal. --Posterior cerebral arteries: Normal. ANTERIOR CIRCULATION: --Intracranial internal carotid arteries: Normal. --Anterior cerebral arteries (ACA): Normal. --Middle cerebral arteries (MCA): Normal. ANATOMIC VARIANTS: Bilateral posterior communicating artery patency. MRA NECK FINDINGS Right dominant vertebral arteries. Diminutive left vertebral artery. Carotid systems are normal. IMPRESSION: 1. Normal MRI of the brain. 2. Normal MRA of the head and neck. Electronically Signed   By: KUlyses JarredM.D.   On: 10/28/2020 22:02   MR ANGIO NECK WO CONTRAST  Result Date: 10/28/2020 CLINICAL DATA:  Transient ischemic attack EXAM: MRI HEAD WITHOUT AND WITH CONTRAST MRA HEAD WITHOUT CONTRAST MRA NECK WITHOUT CONTRAST TECHNIQUE: Multiplanar, multiecho pulse sequences of the brain and surrounding structures were obtained without and with intravenous contrast. Angiographic images of the Circle of Willis were obtained using MRA technique without intravenous contrast. Angiographic images of the neck were obtained using MRA technique without intravenous contrast. Carotid stenosis measurements (when applicable) are obtained utilizing NASCET criteria, using the distal internal carotid diameter as the denominator. CONTRAST:  8.528mGADAVIST GADOBUTROL 1 MMOL/ML IV SOLN COMPARISON:  None. FINDINGS: MRI HEAD FINDINGS Brain: No acute infarct, mass effect or extra-axial collection. No acute or chronic hemorrhage. Normal white matter signal, parenchymal volume and CSF spaces. The midline structures are normal. There is no abnormal contrast enhancement. Vascular: Major flow voids are preserved. Skull and upper cervical spine: Normal calvarium and skull base. Visualized upper cervical spine and soft tissues are normal. Sinuses/Orbits:Opacification of the left frontal sinus. Normal orbits. MRA HEAD FINDINGS POSTERIOR CIRCULATION: --Vertebral arteries: Normal --Inferior cerebellar arteries:  Normal. --Basilar artery: Normal. --Superior cerebellar arteries: Normal. --Posterior cerebral arteries: Normal. ANTERIOR CIRCULATION: --Intracranial internal carotid arteries: Normal. --Anterior cerebral arteries (ACA): Normal. --Middle cerebral arteries (MCA): Normal. ANATOMIC VARIANTS: Bilateral posterior communicating artery patency. MRA NECK FINDINGS Right dominant vertebral arteries. Diminutive left vertebral artery. Carotid systems are normal. IMPRESSION: 1. Normal MRI of the brain. 2. Normal MRA of the head and neck. Electronically Signed   By: KeUlyses Jarred.D.   On: 10/28/2020 22:02   MR BRAIN W WO CONTRAST  Result Date: 10/28/2020 CLINICAL DATA:  Transient ischemic attack EXAM: MRI HEAD WITHOUT AND WITH CONTRAST MRA HEAD WITHOUT CONTRAST MRA NECK WITHOUT CONTRAST TECHNIQUE: Multiplanar, multiecho pulse sequences of the brain and surrounding structures were obtained without and with intravenous contrast. Angiographic images of the Circle of Willis were obtained using MRA technique without intravenous contrast. Angiographic images of the neck were obtained using MRA technique without intravenous contrast. Carotid stenosis measurements (when applicable) are obtained utilizing NASCET criteria, using the distal internal carotid diameter as the denominator. CONTRAST:  8.58m34mADAVIST GADOBUTROL 1 MMOL/ML IV SOLN COMPARISON:  None. FINDINGS: MRI HEAD FINDINGS Brain: No acute infarct,  mass effect or extra-axial collection. No acute or chronic hemorrhage. Normal white matter signal, parenchymal volume and CSF spaces. The midline structures are normal. There is no abnormal contrast enhancement. Vascular: Major flow voids are preserved. Skull and upper cervical spine: Normal calvarium and skull base. Visualized upper cervical spine and soft tissues are normal. Sinuses/Orbits:Opacification of the left frontal sinus. Normal orbits. MRA HEAD FINDINGS POSTERIOR CIRCULATION: --Vertebral arteries: Normal --Inferior  cerebellar arteries: Normal. --Basilar artery: Normal. --Superior cerebellar arteries: Normal. --Posterior cerebral arteries: Normal. ANTERIOR CIRCULATION: --Intracranial internal carotid arteries: Normal. --Anterior cerebral arteries (ACA): Normal. --Middle cerebral arteries (MCA): Normal. ANATOMIC VARIANTS: Bilateral posterior communicating artery patency. MRA NECK FINDINGS Right dominant vertebral arteries. Diminutive left vertebral artery. Carotid systems are normal. IMPRESSION: 1. Normal MRI of the brain. 2. Normal MRA of the head and neck. Electronically Signed   By: Ulyses Jarred M.D.   On: 10/28/2020 22:02   DG Chest Portable 1 View  Result Date: 10/28/2020 CLINICAL DATA:  Altered mental status. EXAM: PORTABLE CHEST 1 VIEW COMPARISON:  April 17, 2018. FINDINGS: The heart size and mediastinal contours are within normal limits. Minimal bibasilar subsegmental atelectasis is noted. The visualized skeletal structures are unremarkable. IMPRESSION: Minimal bibasilar subsegmental atelectasis. Electronically Signed   By: Marijo Conception M.D.   On: 10/28/2020 14:36   DG Abd Portable 1V  Result Date: 10/28/2020 CLINICAL DATA:  Altered mental status, abdominal pain EXAM: PORTABLE ABDOMEN - 1 VIEW COMPARISON:  Radiograph 02/13/2018 FINDINGS: Diffusely air-filled appearance of both large and small bowel with a fairly sizable colonic stool burden noted as well. Loops of small bowel are at the upper limits of normal for caliber measuring up to 2.9 cm in diameter. Stable calcified phleboliths throughout the pelvis. Atelectatic changes noted in the lung bases. Mediastinal contours are unremarkable. Telemetry leads overlie the chest and abdomen. Degenerative changes seen in the spine, hips and pelvis. IMPRESSION: Diffusely air-filled appearance of both large and small bowel with a fairly significant colonic stool burden. Could reflect some slowed intestinal transit versus a mild ileus. Obstruction is less favored.  Electronically Signed   By: Lovena Le M.D.   On: 10/28/2020 19:42   Overnight EEG with video  Result Date: 10/29/2020 Lora Havens, MD     10/29/2020  8:16 AM Patient Name: JASON MARSHALL MRN: [id number] Epilepsy Attending: Lora Havens Referring Physician/Provider: Dr Su Monks Duration: 10/28/2020 1814 to 10/29/2020 0815 Patient history: 60yo M with ams. EEG to evaluate for seizure Level of alertness: Awake, asleep AEDs during EEG study: LEV, VPA Technical aspects: This EEG study was done with scalp electrodes positioned according to the 10-20 International system of electrode placement. Electrical activity was acquired at a sampling rate of '500Hz'$  and reviewed with a high frequency filter of '70Hz'$  and a low frequency filter of '1Hz'$ . EEG data were recorded continuously and digitally stored. Description: The posterior dominant rhythm consists of 8 Hz activity of moderate voltage (25-35 uV) seen predominantly in posterior head regions, symmetric and reactive to eye opening and eye closing. Sleep was characterized by vertex waves, sleep spindles (12 to 14 Hz), maximal frontocentral region. EEG showed intermittent 3-'5hz'$  theta-delta slowing. Hyperventilation and photic stimulation were not performed.   ABNORMALITY - Intermittent slow, generalized IMPRESSION: This study is suggestive of mild diffuse encephalopathy, nonspecific etiology. No seizures or epileptiform discharges were seen throughout the recording. Priyanka Barbra Sarks        Scheduled Meds: Continuous Infusions:  ceFEPime (MAXIPIME) IV 2 g (10/29/20  1051)   lactated ringers 1,000 mL with potassium chloride 20 mEq infusion 100 mL/hr at 10/29/20 0022   levETIRAcetam Stopped (10/29/20 0603)   valproate sodium Stopped (10/29/20 1050)     LOS: 0 days    Time spent: 65mns    JKathie Dike MD [facility]   If 7PM-7AM, please contact night-coverage [email or web address]  10/29/2020, 11:11 AM

## 2020-10-29 NOTE — ED Notes (Signed)
Pt asking for something to eat advised would need to speak with provider and that he would need to wake up some more to eat something. Provider sent a message reference same

## 2020-10-29 NOTE — ED Notes (Signed)
Breakfast finished, back to reclining, alert, NAD, calm, watching TV.

## 2020-10-29 NOTE — Progress Notes (Signed)
EEG maintenance complete. No skin breakdown at FP1 C3 P4

## 2020-10-29 NOTE — Progress Notes (Signed)
PHARMACY - PHYSICIAN COMMUNICATION CRITICAL VALUE ALERT - BLOOD CULTURE IDENTIFICATION (BCID)  Nathan [name] is an 60 y.o. male who presented to [facility] on 10/28/2020 with a chief complaint of AMS.  Assessment:  60 YOM on Cefepime for UTI coverage and now with 1 of 4 bottles growing GPC in clusters with BCID detecting Staph species - likely representative of a contaminant.   Name of physician (or Provider) Contacted: Memon  Current antibiotics: Cefepime  Changes to prescribed antibiotics recommended:  None - f/u on  urine cultures results.   Results for orders placed or performed during the hospital encounter of 10/28/20  Blood Culture ID Panel (Reflexed) (Collected: 10/28/2020  5:10 PM)  Result Value Ref Range   Enterococcus faecalis NOT DETECTED NOT DETECTED   Enterococcus Faecium NOT DETECTED NOT DETECTED   Listeria monocytogenes NOT DETECTED NOT DETECTED   Staphylococcus species DETECTED (A) NOT DETECTED   Staphylococcus aureus (BCID) NOT DETECTED NOT DETECTED   Staphylococcus epidermidis NOT DETECTED NOT DETECTED   Staphylococcus lugdunensis NOT DETECTED NOT DETECTED   Streptococcus species NOT DETECTED NOT DETECTED   Streptococcus agalactiae NOT DETECTED NOT DETECTED   Streptococcus pneumoniae NOT DETECTED NOT DETECTED   Streptococcus pyogenes NOT DETECTED NOT DETECTED   A.calcoaceticus-baumannii NOT DETECTED NOT DETECTED   Bacteroides fragilis NOT DETECTED NOT DETECTED   Enterobacterales NOT DETECTED NOT DETECTED   Enterobacter cloacae complex NOT DETECTED NOT DETECTED   Escherichia coli NOT DETECTED NOT DETECTED   Klebsiella aerogenes NOT DETECTED NOT DETECTED   Klebsiella oxytoca NOT DETECTED NOT DETECTED   Klebsiella pneumoniae NOT DETECTED NOT DETECTED   Proteus species NOT DETECTED NOT DETECTED   Salmonella species NOT DETECTED NOT DETECTED   Serratia marcescens NOT DETECTED NOT DETECTED   Haemophilus influenzae NOT DETECTED NOT DETECTED   Neisseria  meningitidis NOT DETECTED NOT DETECTED   Pseudomonas aeruginosa NOT DETECTED NOT DETECTED   Stenotrophomonas maltophilia NOT DETECTED NOT DETECTED   Candida albicans NOT DETECTED NOT DETECTED   Candida auris NOT DETECTED NOT DETECTED   Candida glabrata NOT DETECTED NOT DETECTED   Candida krusei NOT DETECTED NOT DETECTED   Candida parapsilosis NOT DETECTED NOT DETECTED   Candida tropicalis NOT DETECTED NOT DETECTED   Cryptococcus neoformans/gattii NOT DETECTED NOT DETECTED    Thank you for allowing pharmacy to be a part of this patient's care.  Alycia Rossetti, PharmD, BCPS Clinical Pharmacist Clinical phone for 10/29/2020: [phone] 10/29/2020 5:26 PM   **Pharmacist phone directory can now be found on [email or web address] (PW [email or web address]).  Listed under [place].

## 2020-10-29 NOTE — ED Notes (Signed)
EEG at [facility] to remove EEG. Neuro MD at [facility].

## 2020-10-29 NOTE — Progress Notes (Signed)
vLTM EEG complete. No skin breakdown 

## 2020-10-30 ENCOUNTER — Telehealth (HOSPITAL_COMMUNITY): Payer: Self-pay | Admitting: *Deleted

## 2020-10-30 DIAGNOSIS — G9340 Encephalopathy, unspecified: Secondary | ICD-10-CM

## 2020-10-30 DIAGNOSIS — N1831 Chronic kidney disease, stage 3a: Secondary | ICD-10-CM | POA: Diagnosis not present

## 2020-10-30 DIAGNOSIS — E876 Hypokalemia: Secondary | ICD-10-CM | POA: Diagnosis not present

## 2020-10-30 DIAGNOSIS — G9341 Metabolic encephalopathy: Secondary | ICD-10-CM | POA: Diagnosis not present

## 2020-10-30 DIAGNOSIS — F259 Schizoaffective disorder, unspecified: Secondary | ICD-10-CM | POA: Diagnosis not present

## 2020-10-30 DIAGNOSIS — B20 Human immunodeficiency virus [HIV] disease: Secondary | ICD-10-CM | POA: Diagnosis not present

## 2020-10-30 LAB — URINE CULTURE: Culture: NO GROWTH

## 2020-10-30 LAB — CBC WITH DIFFERENTIAL/PLATELET
Abs Immature Granulocytes: 0.02 10*3/uL (ref 0.00–0.07)
Basophils Absolute: 0 10*3/uL (ref 0.0–0.1)
Basophils Relative: 1 %
Eosinophils Absolute: 0.3 10*3/uL (ref 0.0–0.5)
Eosinophils Relative: 5 %
HCT: 31.3 % — ABNORMAL LOW (ref 39.0–52.0)
Hemoglobin: 9.8 g/dL — ABNORMAL LOW (ref 13.0–17.0)
Immature Granulocytes: 0 %
Lymphocytes Relative: 18 %
Lymphs Abs: 1.2 10*3/uL (ref 0.7–4.0)
MCH: 29.4 pg (ref 26.0–34.0)
MCHC: 31.3 g/dL (ref 30.0–36.0)
MCV: 94 fL (ref 80.0–100.0)
Monocytes Absolute: 0.5 10*3/uL (ref 0.1–1.0)
Monocytes Relative: 8 %
Neutro Abs: 4.5 10*3/uL (ref 1.7–7.7)
Neutrophils Relative %: 68 %
Platelets: 123 10*3/uL — ABNORMAL LOW (ref 150–400)
RBC: 3.33 MIL/uL — ABNORMAL LOW (ref 4.22–5.81)
RDW: 14.5 % (ref 11.5–15.5)
WBC: 6.5 10*3/uL (ref 4.0–10.5)
nRBC: 0 % (ref 0.0–0.2)

## 2020-10-30 LAB — CBC
HCT: 35 % — ABNORMAL LOW (ref 39.0–52.0)
Hemoglobin: 11.3 g/dL — ABNORMAL LOW (ref 13.0–17.0)
MCH: 29.6 pg (ref 26.0–34.0)
MCHC: 32.3 g/dL (ref 30.0–36.0)
MCV: 91.6 fL (ref 80.0–100.0)
Platelets: 137 10*3/uL — ABNORMAL LOW (ref 150–400)
RBC: 3.82 MIL/uL — ABNORMAL LOW (ref 4.22–5.81)
RDW: 14.4 % (ref 11.5–15.5)
WBC: 8.1 10*3/uL (ref 4.0–10.5)
nRBC: 0 % (ref 0.0–0.2)

## 2020-10-30 LAB — COMPREHENSIVE METABOLIC PANEL
ALT: 24 U/L (ref 0–44)
AST: 25 U/L (ref 15–41)
Albumin: 2.9 g/dL — ABNORMAL LOW (ref 3.5–5.0)
Alkaline Phosphatase: 53 U/L (ref 38–126)
Anion gap: 5 (ref 5–15)
BUN: 11 mg/dL (ref 8–23)
CO2: 26 mmol/L (ref 22–32)
Calcium: 9.1 mg/dL (ref 8.9–10.3)
Chloride: 108 mmol/L (ref 98–111)
Creatinine, Ser: 1.27 mg/dL — ABNORMAL HIGH (ref 0.61–1.24)
GFR, Estimated: >60 mL/min (ref >60)
Glucose, Bld: 130 mg/dL — ABNORMAL HIGH (ref 70–99)
Potassium: 4.5 mmol/L (ref 3.5–5.1)
Sodium: 139 mmol/L (ref 135–145)
Total Bilirubin: 0.2 mg/dL — ABNORMAL LOW (ref 0.3–1.2)
Total Protein: 6.5 g/dL (ref 6.5–8.1)

## 2020-10-30 MED ORDER — CLOZAPINE 25 MG PO TABS
12.5000 mg | ORAL_TABLET | Freq: Two times a day (BID) | ORAL | Status: DC
  Filled 2020-10-30: qty 1

## 2020-10-30 MED ORDER — CLOZAPINE 25 MG PO TABS: 175.0000 mg | ORAL_TABLET | Freq: Every day | ORAL | Status: DC

## 2020-10-30 MED ORDER — CLOZAPINE 25 MG PO TABS: 25.0000 mg | ORAL_TABLET | Freq: Two times a day (BID) | ORAL | Status: DC

## 2020-10-30 MED ORDER — CLOZAPINE 25 MG PO TABS
25.0000 mg | ORAL_TABLET | Freq: Every day | ORAL | Status: DC
  Administered 2020-10-30: 25 mg via ORAL
  Filled 2020-10-30 (×2): qty 1

## 2020-10-30 MED ORDER — CLOZAPINE 100 MG PO TABS: 200.0000 mg | ORAL_TABLET | Freq: Every day | ORAL | Status: DC

## 2020-10-30 MED ORDER — CLOZAPINE 25 MG PO TABS: 37.5000 mg | ORAL_TABLET | Freq: Two times a day (BID) | ORAL | Status: DC

## 2020-10-30 MED ORDER — CLOZAPINE 25 MG PO TABS: 75.0000 mg | ORAL_TABLET | Freq: Two times a day (BID) | ORAL | Status: DC

## 2020-10-30 MED ORDER — CLOZAPINE 25 MG PO TABS: 87.5000 mg | ORAL_TABLET | Freq: Two times a day (BID) | ORAL | Status: DC

## 2020-10-30 MED ORDER — BISACODYL 10 MG RE SUPP
10.0000 mg | Freq: Once | RECTAL | Status: AC
  Administered 2020-10-30: 10 mg via RECTAL
  Filled 2020-10-30: qty 1

## 2020-10-30 MED ORDER — CLOZAPINE 25 MG PO TABS: 62.5000 mg | ORAL_TABLET | Freq: Two times a day (BID) | ORAL | Status: DC

## 2020-10-30 MED ORDER — CLOZAPINE 25 MG PO TABS: 125.0000 mg | ORAL_TABLET | Freq: Every day | ORAL | Status: DC

## 2020-10-30 MED ORDER — CLOZAPINE 100 MG PO TABS: 100.0000 mg | ORAL_TABLET | Freq: Two times a day (BID) | ORAL | Status: DC

## 2020-10-30 MED ORDER — CLOZAPINE 25 MG PO TABS: 150.0000 mg | ORAL_TABLET | Freq: Every day | ORAL | Status: DC

## 2020-10-30 MED ORDER — CLOZAPINE 25 MG PO TABS: 50.0000 mg | ORAL_TABLET | Freq: Two times a day (BID) | ORAL | Status: DC

## 2020-10-30 MED ORDER — CLOZAPINE 100 MG PO TABS: 100.0000 mg | ORAL_TABLET | Freq: Every day | ORAL | Status: DC

## 2020-10-30 NOTE — Evaluation (Signed)
Occupational Therapy Evaluation Patient Details Name: Nathan [name] MRN: [id number] DOB: [birth year] Today's Date: 10/30/2020    History of Present Illness 60 y.o. male presenting to ED 7/23 after being found slumped over the table while eating lunch. Patient admitted with acute encephalopathy of unknown etiology. Futher work-up pending. PMHx significant for recurrent hospital admissions, normocytic anemia, anxiety, bipolar 1 disorder, schizophrenia, MDD, HIV, renal failure, seizures, and developmental delay documented as "mental retardation".   Clinical Impression   PTA patient was living at a group home. Attempted to contact guardian Carmel [place] but was unsuccessful. Will attempt to obtain PLOF and home set-up information at time of next treatment session. Patient currently requiring close supervision to Min guard for ADLs grossly and is limited by deficits listed below including decreased balance. OT will continue to follow acutely to maximize safety/independence with self-care tasks in prep for return home. Patient likely to progress well.    Follow Up Recommendations  No OT follow up;Supervision/Assistance - 24 hour    Equipment Recommendations  None recommended by OT    Recommendations for Other Services       Precautions / Restrictions Precautions Precautions: Fall Restrictions Weight Bearing Restrictions: No      Mobility Bed Mobility Overal bed mobility: Modified Independent                  Transfers Overall transfer level: Needs assistance Equipment used: None Transfers: Sit to/from Stand Sit to Stand: Supervision         General transfer comment: Close supervision A for sit to stand from EOB.    Balance Overall balance assessment: Needs assistance Sitting-balance support: Feet supported Sitting balance-Leahy Scale: Good     Standing balance support: During functional activity;No upper extremity supported Standing balance-Leahy Scale: Fair Standing  balance comment: Able to maintain static standing balance without UE support. Stands with knees flexed; noted sway with static standing. Would benefit from steaying assist with dynamic balance. No overt LOB with toileting/grooming tasks in standing.                           ADL either performed or assessed with clinical judgement   ADL Overall ADL's : Needs assistance/impaired     Grooming: Supervision/safety;Standing Grooming Details (indicate cue type and reason): 2/3 grooming tasks standing at sink level with close sueprvision A.             Lower Body Dressing: Supervision/safety;Sit to/from stand Lower Body Dressing Details (indicate cue type and reason): Able to don footwear seated EOB without external assist. Toilet Transfer: Min guard Toilet Transfer Details (indicate cue type and reason): Min guard for steadying to commode in bathroom. Toileting- Water quality scientist and Hygiene: Min guard;Sit to/from stand Toileting - Clothing Manipulation Details (indicate cue type and reason): Min guard for steadying/safety.     Functional mobility during ADLs: Min guard       Vision Baseline Vision/History: No visual deficits Patient Visual Report: No change from baseline       Perception     Praxis      Pertinent Vitals/Pain Pain Assessment: No/denies pain     Hand Dominance Right   Extremity/Trunk Assessment Upper Extremity Assessment Upper Extremity Assessment: Overall WFL for tasks assessed   Lower Extremity Assessment Lower Extremity Assessment: Defer to PT evaluation       Communication Communication Communication: Other (comment) (Difficulty to understand)   Cognition Arousal/Alertness: Awake/alert Behavior During Therapy: WFL for tasks assessed/performed  Overall Cognitive Status: History of cognitive impairments - at baseline                                 General Comments: A&Ox4; follows 1-step verbal commands with good  accuracy; requires increased time to process verbal information.   General Comments       Exercises     Shoulder Instructions      Home Living Family/patient expects to be discharged to:: Group home                                 Additional Comments: [facility] (info obtained from med chart, patient unable to recall name of group home)      Prior Functioning/Environment Level of Independence: Needs assistance        Comments: Attempted to contact guardian via phonecall. No answer. Will continue to assess.        OT Problem List: Impaired balance (sitting and/or standing)      OT Treatment/Interventions: Self-care/ADL training;Therapeutic exercise;DME and/or AE instruction;Therapeutic activities;Patient/family education;Balance training    OT Goals(Current goals can be found in the care plan section) Acute Rehab OT Goals Patient Stated Goal: To return home. OT Goal Formulation: With patient Time For Goal Achievement: 11/13/20 Potential to Achieve Goals: Good ADL Goals Pt Will Perform Grooming: with supervision;standing Pt Will Perform Upper Body Dressing: Independently Pt Will Perform Lower Body Dressing: Independently Pt Will Transfer to Toilet: Independently;ambulating Pt Will Perform Toileting - Clothing Manipulation and hygiene: Independently;sit to/from stand  OT Frequency: Min 2X/week   Barriers to D/C:            Co-evaluation              AM-PAC OT "6 Clicks" Daily Activity     Outcome Measure Help from another person eating meals?: None Help from another person taking care of personal grooming?: A Little Help from another person toileting, which includes using toliet, bedpan, or urinal?: A Little Help from another person bathing (including washing, rinsing, drying)?: A Little Help from another person to put on and taking off regular upper body clothing?: A Little Help from another person to put on and taking off regular  lower body clothing?: A Little 6 Click Score: 19   End of Session Equipment Utilized During Treatment: Gait belt Nurse Communication: Mobility status  Activity Tolerance: Patient tolerated treatment well Patient left: in chair;with call bell/phone within reach;with chair alarm set  OT Visit Diagnosis: Unsteadiness on feet (R26.81)                Time: [id number] OT Time Calculation (min): 19 min Charges:  OT General Charges $OT Visit: 1 Visit OT Evaluation $OT Eval Low Complexity: 1 Low  Donyelle Enyeart H. OTR/L Supplemental OT, Department of rehab services [phone]  Charlean Carneal R H. 10/30/2020, 9:39 AM

## 2020-10-30 NOTE — Progress Notes (Signed)
Physical Therapy Evaluation Patient Details Name: Nathan [name] MRN: [id number] DOB: [birth year] Today's Date: 10/30/2020   History of Present Illness  60 y.o. male presenting to ED 7/23 after being found slumped over the table while eating lunch. Patient admitted with acute encephalopathy of unknown etiology. Futher work-up pending. PMHx significant for recurrent hospital admissions, normocytic anemia, anxiety, bipolar 1 disorder, schizophrenia, MDD, HIV, renal failure, seizures, and developmental delay documented as "mental retardation".  Clinical Impression   Pt admitted secondary to problem above with deficits below. PTA patient was living in [place] and level of assist unknown. Pt currently requires minguard to supervision assist with basic mobility including ambulation. Anticipate he is close to his baseline. Will continue to follow until his baseline can be established with facility or he demonstrates modified independence. Anticipate patient will benefit from PT to address problems listed below.Will continue to follow acutely to maximize functional mobility independence and safety.       Follow Up Recommendations No PT follow up;Supervision/Assistance - 24 hour (return to Group HOme)    Equipment Recommendations  None recommended by PT    Recommendations for Other Services       Precautions / Restrictions Precautions Precautions: Fall Restrictions Weight Bearing Restrictions: No      Mobility  Bed Mobility Overal bed mobility: Modified Independent             General bed mobility comments: up in recliner    Transfers Overall transfer level: Needs assistance Equipment used: None Transfers: Sit to/from Stand Sit to Stand: Supervision         General transfer comment: Close supervision A for sit to stand from chair and toilet  Ambulation/Gait Ambulation/Gait assistance: Min guard Gait Distance (Feet): 400 Feet Assistive device: None Gait  Pattern/deviations: Step-through pattern;Trendelenburg;Decreased step length - left     General Gait Details: pt with increased trendelenburg gait with incr distance; slightly crouched gait with slight stagger step x 2 over 400 ft (recovered without assist)  Stairs            Wheelchair Mobility    Modified Rankin (Stroke Patients Only)       Balance Overall balance assessment: Needs assistance Sitting-balance support: Feet supported Sitting balance-Leahy Scale: Good     Standing balance support: During functional activity;No upper extremity supported Standing balance-Leahy Scale: Fair Standing balance comment: Able to maintain static standing balance without UE support.                             Pertinent Vitals/Pain Pain Assessment: No/denies pain    Home Living Family/patient expects to be discharged to:: Group home                 Additional Comments: [facility] (info obtained from med chart, patient unable to recall name of group home)    Prior Function Level of Independence: Needs assistance         Comments: unsure level of assist at group home     Hand Dominance   Dominant Hand: Right    Extremity/Trunk Assessment   Upper Extremity Assessment Upper Extremity Assessment: Overall WFL for tasks assessed    Lower Extremity Assessment Lower Extremity Assessment: LLE deficits/detail;RLE deficits/detail RLE Deficits / Details: noted crouched gait with rt trendelenburg LLE Deficits / Details: noted crouched gait       Communication   Communication: Other (comment) (Difficulty to understand at times)  Cognition Arousal/Alertness: Awake/alert  Behavior During Therapy: WFL for tasks assessed/performed Overall Cognitive Status: History of cognitive impairments - at baseline                                 General Comments: A&Ox4; follows 1-step verbal commands with good accuracy; requires increased time to  process verbal information.      General Comments      Exercises     Assessment/Plan    PT Assessment Patient needs continued PT services  PT Problem List Decreased balance;Decreased mobility;Decreased cognition       PT Treatment Interventions DME instruction;Gait training;Functional mobility training;Stair training;Therapeutic activities;Therapeutic exercise;Balance training;Patient/family education    PT Goals (Current goals can be found in the Care Plan section)  Acute Rehab PT Goals Patient Stated Goal: To return home. PT Goal Formulation: With patient (general goals of safe walking, stairs) Time For Goal Achievement: 11/13/20 Potential to Achieve Goals: Good    Frequency Min 3X/week   Barriers to discharge        Co-evaluation               AM-PAC PT "6 Clicks" Mobility  Outcome Measure Help needed turning from your back to your side while in a flat bed without using bedrails?: None Help needed moving from lying on your back to sitting on the side of a flat bed without using bedrails?: None Help needed moving to and from a bed to a chair (including a wheelchair)?: A Little Help needed standing up from a chair using your arms (e.g., wheelchair or bedside chair)?: A Little Help needed to walk in hospital room?: A Little Help needed climbing 3-5 steps with a railing? : A Little 6 Click Score: 20    End of Session Equipment Utilized During Treatment: Gait belt Activity Tolerance: Patient tolerated treatment well Patient left: in chair;with call bell/phone within reach;with chair alarm set Nurse Communication: Mobility status PT Visit Diagnosis: Unsteadiness on feet (R26.81);Difficulty in walking, not elsewhere classified (R26.2)    Time: [id number] PT Time Calculation (min) (ACUTE ONLY): 15 min   Charges:   PT Evaluation $PT Eval Low Complexity: 1 Low           Arby Barrette, PT Pager [phone]   Rexanne Mano 10/30/2020, 10:47 AM

## 2020-10-30 NOTE — Plan of Care (Signed)
  Problem: Activity: Goal: Risk for activity intolerance will decrease Outcome: Progressing   Problem: Nutrition: Goal: Adequate nutrition will be maintained Outcome: Progressing   Problem: Pain Managment: Goal: General experience of comfort will improve Outcome: Progressing   

## 2020-10-30 NOTE — Consult Note (Signed)
[facility] Face-to-Face Psychiatry Consult   Reason for Consult:  Medication mgmt Referring Physician:  Kathie Dike, MD Patient Identification: Nathan [name] MRN:  [id number] Principal Diagnosis: Acute encephalopathy Diagnosis:  Principal Problem:   Acute encephalopathy Active Problems:   HIV infection ([place])   Hyponatremia   CKD (chronic kidney disease) stage 3, GFR 30-59 ml/min (HCC)   GERD (gastroesophageal reflux disease)   Seizure ([place])   Schizoaffective disorder (HCC)   Hypothermia   Hypokalemia   Normocytic anemia   Thrombocytopenia ([place])   Total Time spent with patient: 20 minutes  Subjective:   Nathan [name] is a 60 y.o. male patient admitted with history of schizoaffective disorder, HIV, seizure disorder, who resides at group home, brought to the hospital from his group home with decreased LOC .  HPI:  On assessment this AM patient is up to chair and alert and oriented x 3. Patient reports that he is hungry and asks for a snack if he cannot get his lunch soon. Patient is limited but able to report that he lives in a group home and gives the address. Patient also reports that he was diagnosed with schizophrenia in the past he cannot recall who his psychiatrist is and reports that he has only seen the person once. Patient denies SI, HI, and AVH.  Collateral from Guardian (Cheri [place]) and Group Home Worker [phone] @ [place] [phone]). Cheri reports that patient has been living at his group home and was taking Clozaril under a previous provider; however the provider left and the group home was unaware that the provider was going to move. Patient was taken to [facility] OP clinic 7/20 for continued medication management as he needed a new prescription for his  Clozaril. Charise Carwin reports that the group home facility was told that the medication would be sent to them, but they never received the medication. Charise Carwin reports that patient was taking Clozaril $RemoveBeforeD'200mg'ATGwEHGbtJLKlF$   AM and $Remo'100mg'MLIGI$  QHS. Per EMR patient has been without Clozaril since 7.13.2022.   Past Psychiatric History: Schizoaffective disorder Per EMR review patient had exaggerated tremor since being off the Clozaril and patient was also exhibiting random outburst and appeared abnormally agitated.   Risk to Self:  NO Risk to Others:  NO Prior Inpatient Therapy:   YES Prior Outpatient Therapy:  YES  Past Medical History:  Past Medical History:  Diagnosis Date   Anemia    Anemia    Anxiety    Bipolar 1 disorder ([place])    Depression, major    HIV (human immunodeficiency virus infection) ([place])    Hyperglycemia 08/06/2017   Hypernatremia    Kidney failure    Lithium toxicity    MR (mental retardation)    Schizoaffective disorder ([place]) 08/06/2017   Schizophrenia ([place])    Seizures ([place])     Past Surgical History:  Procedure Laterality Date   BIOPSY N/A 04/12/2014   Procedure: BIOPSY;  Surgeon: Danie Binder, MD;  Location: [facility];  Service: Endoscopy;  Laterality: N/A;  Gastric   COLONOSCOPY WITH PROPOFOL N/A 04/12/2014   SLF:  1. one colon polyp removed 2. The left colon is extremely redundant 3. Small internal hemorrhoids   ESOPHAGOGASTRODUODENOSCOPY (EGD) WITH PROPOFOL N/A 04/12/2014   SLF: Stricture at the gastroesophageal junction 2. small polyp  in teh gastric body 3. Mild Non-erosive gastritis.    NOSE SURGERY  1968   unsure what type of surgery. something to help him breathe better   POLYPECTOMY N/A 04/12/2014  Procedure: POLYPECTOMY;  Surgeon: Danie Binder, MD;  Location: [facility];  Service: Endoscopy;  Laterality: N/A;  Cecal   SAVORY DILATION N/A 04/12/2014   Procedure: SAVORY DILATION;  Surgeon: Danie Binder, MD;  Location: [facility];  Service: Endoscopy;  Laterality: N/A;  12.8/ 14/15/16   SKIN BIOPSY     Family History:  Family History  Problem Relation Age of Onset   Other Mother        unknown medical history   Other Father        unknown medical history   Colon cancer Neg Hx     Family Psychiatric  History: Unknown Social History:  Social History   Substance and Sexual Activity  Alcohol Use No   Alcohol/week: 0.0 standard drinks     Social History   Substance and Sexual Activity  Drug Use No    Social History   Socioeconomic History   Marital status: Single    Spouse name: Not on file   Number of children: 0   Years of education: 9th grade   Highest education level: Not on file  Occupational History   Occupation: Disabled  Tobacco Use   Smoking status: Never   Smokeless tobacco: Never  Vaping Use   Vaping Use: Never used  Substance and Sexual Activity   Alcohol use: No    Alcohol/week: 0.0 standard drinks   Drug use: No   Sexual activity: Never    Birth control/protection: Abstinence    Comment: DECLINED CONDOMS  Other Topics Concern   Not on file  Social History Narrative   Lives in group home.   Right-handed.   2-3 sodas per day.   Social Determinants of Health   Financial Resource Strain: Not on file  Food Insecurity: Not on file  Transportation Needs: Not on file  Physical Activity: Not on file  Stress: Not on file  Social Connections: Not on file   Additional Social History:    Allergies:  No Known Allergies  Labs:  Results for orders placed or performed during the hospital encounter of 10/28/20 (from the past 48 hour(s))  CBG monitoring, ED     Status: Abnormal   Collection Time: 10/28/20  1:58 PM  Result Value Ref Range   Glucose-Capillary 143 (H) 70 - 99 mg/dL    Comment: Glucose reference range applies only to samples taken after fasting for at least 8 hours.  Rapid urine drug screen (hospital performed)     Status: None   Collection Time: 10/28/20  2:05 PM  Result Value Ref Range   Opiates NONE DETECTED NONE DETECTED   Cocaine NONE DETECTED NONE DETECTED   Benzodiazepines NONE DETECTED NONE DETECTED   Amphetamines NONE DETECTED NONE DETECTED   Tetrahydrocannabinol NONE DETECTED NONE DETECTED   Barbiturates  NONE DETECTED NONE DETECTED    Comment: (NOTE) DRUG SCREEN FOR MEDICAL PURPOSES ONLY.  IF CONFIRMATION IS NEEDED FOR ANY PURPOSE, NOTIFY LAB WITHIN 5 DAYS.  LOWEST DETECTABLE LIMITS FOR URINE DRUG SCREEN Drug Class                     Cutoff (ng/mL) Amphetamine and metabolites    1000 Barbiturate and metabolites    200 Benzodiazepine                 630 Tricyclics and metabolites     300 Opiates and metabolites        300 Cocaine and metabolites  300 THC                            50 Performed at [facility], [place] [address], [place], [place] [address]   CBC with Differential     Status: Abnormal   Collection Time: 10/28/20  2:18 PM  Result Value Ref Range   WBC 6.7 4.0 - 10.5 K/uL   RBC 3.65 (L) 4.22 - 5.81 MIL/uL   Hemoglobin 10.8 (L) 13.0 - 17.0 g/dL   HCT 33.5 (L) 39.0 - 52.0 %   MCV 91.8 80.0 - 100.0 fL   MCH 29.6 26.0 - 34.0 pg   MCHC 32.2 30.0 - 36.0 g/dL   RDW 14.2 11.5 - 15.5 %   Platelets 117 (L) 150 - 400 K/uL    Comment: Immature Platelet Fraction may be clinically indicated, consider ordering this additional test [id number] REPEATED TO VERIFY PLATELET COUNT CONFIRMED BY SMEAR    nRBC 0.0 0.0 - 0.2 %   Neutrophils Relative % 62 %   Neutro Abs 4.1 1.7 - 7.7 K/uL   Lymphocytes Relative 25 %   Lymphs Abs 1.7 0.7 - 4.0 K/uL   Monocytes Relative 10 %   Monocytes Absolute 0.7 0.1 - 1.0 K/uL   Eosinophils Relative 3 %   Eosinophils Absolute 0.2 0.0 - 0.5 K/uL   Basophils Relative 0 %   Basophils Absolute 0.0 0.0 - 0.1 K/uL   Immature Granulocytes 0 %   Abs Immature Granulocytes 0.01 0.00 - 0.07 K/uL    Comment: Performed at [facility], [place] [address] [place], [place] [address]  Comprehensive metabolic panel     Status: Abnormal   Collection Time: 10/28/20  2:18 PM  Result Value Ref Range   Sodium 138 135 - 145 mmol/L   Potassium 3.2 (L) 3.5 - 5.1 mmol/L   Chloride 106 98 - 111 mmol/L   CO2 24 22 - 32 mmol/L   Glucose, Bld 145 (H) 70  - 99 mg/dL    Comment: Glucose reference range applies only to samples taken after fasting for at least 8 hours.   BUN 19 8 - 23 mg/dL   Creatinine, Ser 1.70 (H) 0.61 - 1.24 mg/dL   Calcium 8.8 (L) 8.9 - 10.3 mg/dL   Total Protein 6.6 6.5 - 8.1 g/dL   Albumin 3.3 (L) 3.5 - 5.0 g/dL   AST 44 (H) 15 - 41 U/L   ALT 29 0 - 44 U/L   Alkaline Phosphatase 45 38 - 126 U/L   Total Bilirubin 0.4 0.3 - 1.2 mg/dL   GFR, Estimated 45 (L) >60 mL/min    Comment: (NOTE) Calculated using the CKD-EPI Creatinine Equation (2021)    Anion gap 8 5 - 15    Comment: Performed at [facility], [place] [address], [place], [place] [address]  Lactic acid, plasma     Status: None   Collection Time: 10/28/20  2:18 PM  Result Value Ref Range   Lactic Acid, Venous 1.7 0.5 - 1.9 mmol/L    Comment: Performed at [facility] [address], [place], [place] [address]  Ammonia     Status: None   Collection Time: 10/28/20  2:18 PM  Result Value Ref Range   Ammonia 33 9 - 35 umol/L    Comment: Performed at [facility], [place] [address] [place], [place] [address]  Ethanol  Status: None   Collection Time: 10/28/20  2:31 PM  Result Value Ref Range   Alcohol, Ethyl (B) <10 <10 mg/dL    Comment: (NOTE) Lowest detectable limit for serum alcohol is 10 mg/dL.  For medical purposes only. Performed at [facility], [place] [address], [place], [place] [address]   Lactic acid, plasma     Status: None   Collection Time: 10/28/20  3:52 PM  Result Value Ref Range   Lactic Acid, Venous 1.6 0.5 - 1.9 mmol/L    Comment: Performed at [facility] [address], [place], [place] [address]  Valproic acid level     Status: None   Collection Time: 10/28/20  3:52 PM  Result Value Ref Range   Valproic Acid Lvl 53 50.0 - 100.0 ug/mL    Comment: Performed at [facility] [address], [place], [place] [address]  Blood culture (routine x 2)     Status: None (Preliminary result)   Collection  Time: 10/28/20  5:10 PM   Specimen: BLOOD LEFT FOREARM  Result Value Ref Range   Specimen Description BLOOD LEFT FOREARM    Special Requests      BOTTLES DRAWN AEROBIC AND ANAEROBIC Blood Culture adequate volume   Culture  Setup Time      GRAM POSITIVE COCCI AEROBIC BOTTLE ONLY CRITICAL RESULT CALLED TO, READ BACK BY AND VERIFIED WITH: Vena Austria E.MARTIN ON [id number] AT 6789 BY E.PARRISH    Culture      GRAM POSITIVE COCCI IDENTIFICATION TO FOLLOW Performed at [facility], [place] [address], [place], [place] [address]    Report Status PENDING   Blood Culture ID Panel (Reflexed)     Status: Abnormal   Collection Time: 10/28/20  5:10 PM  Result Value Ref Range   Enterococcus faecalis NOT DETECTED NOT DETECTED   Enterococcus Faecium NOT DETECTED NOT DETECTED   Listeria monocytogenes NOT DETECTED NOT DETECTED   Staphylococcus species DETECTED (A) NOT DETECTED    Comment: CRITICAL RESULT CALLED TO, READ BACK BY AND VERIFIED WITH: Vena Austria E.MARTIN ON [id number] AT 1714 BY E.PARRISH    Staphylococcus aureus (BCID) NOT DETECTED NOT DETECTED   Staphylococcus epidermidis NOT DETECTED NOT DETECTED   Staphylococcus lugdunensis NOT DETECTED NOT DETECTED   Streptococcus species NOT DETECTED NOT DETECTED   Streptococcus agalactiae NOT DETECTED NOT DETECTED   Streptococcus pneumoniae NOT DETECTED NOT DETECTED   Streptococcus pyogenes NOT DETECTED NOT DETECTED   A.calcoaceticus-baumannii NOT DETECTED NOT DETECTED   Bacteroides fragilis NOT DETECTED NOT DETECTED   Enterobacterales NOT DETECTED NOT DETECTED   Enterobacter cloacae complex NOT DETECTED NOT DETECTED   Escherichia coli NOT DETECTED NOT DETECTED   Klebsiella aerogenes NOT DETECTED NOT DETECTED   Klebsiella oxytoca NOT DETECTED NOT DETECTED   Klebsiella pneumoniae NOT DETECTED NOT DETECTED   Proteus species NOT DETECTED NOT DETECTED   Salmonella species NOT DETECTED NOT DETECTED   Serratia marcescens NOT DETECTED NOT DETECTED    Haemophilus influenzae NOT DETECTED NOT DETECTED   Neisseria meningitidis NOT DETECTED NOT DETECTED   Pseudomonas aeruginosa NOT DETECTED NOT DETECTED   Stenotrophomonas maltophilia NOT DETECTED NOT DETECTED   Candida albicans NOT DETECTED NOT DETECTED   Candida auris NOT DETECTED NOT DETECTED   Candida glabrata NOT DETECTED NOT DETECTED   Candida krusei NOT DETECTED NOT DETECTED   Candida parapsilosis NOT DETECTED NOT DETECTED   Candida tropicalis NOT DETECTED NOT DETECTED   Cryptococcus neoformans/gattii NOT DETECTED NOT  DETECTED    Comment: Performed at [facility], [address], [place], [place] [address]  Blood culture (routine x 2)     Status: None (Preliminary result)   Collection Time: 10/28/20  5:15 PM   Specimen: BLOOD  Result Value Ref Range   Specimen Description BLOOD LEFT ANTECUBITAL    Special Requests      BOTTLES DRAWN AEROBIC AND ANAEROBIC Blood Culture adequate volume   Culture      NO GROWTH 2 DAYS Performed at [facility], [address] [place], [place] [address]    Report Status PENDING   Resp Panel by RT-PCR (Flu A&B, Covid) Nasopharyngeal Swab     Status: None   Collection Time: 10/29/20 12:15 AM   Specimen: Nasopharyngeal Swab; Nasopharyngeal(NP) swabs in vial transport medium  Result Value Ref Range   SARS Coronavirus 2 by RT PCR NEGATIVE NEGATIVE    Comment: (NOTE) SARS-CoV-2 target nucleic acids are NOT DETECTED.  The SARS-CoV-2 RNA is generally detectable in upper respiratory specimens during the acute phase of infection. The lowest concentration of SARS-CoV-2 viral copies this assay can detect is 138 copies/mL. A negative result does not preclude SARS-Cov-2 infection and should not be used as the sole basis for treatment or other patient management decisions. A negative result may occur with  improper specimen collection/handling, submission of specimen other than nasopharyngeal swab, presence of viral mutation(s) within  the areas targeted by this assay, and inadequate number of viral copies(<138 copies/mL). A negative result must be combined with clinical observations, patient history, and epidemiological information. The expected result is Negative.  Fact Sheet for Patients:  BloggerCourse.com  Fact Sheet for Healthcare Providers:  SeriousBroker.it  This test is no t yet approved or cleared by the [place] FDA and  has been authorized for detection and/or diagnosis of SARS-CoV-2 by FDA under an Emergency Use Authorization (EUA). This EUA will remain  in effect (meaning this test can be used) for the duration of the COVID-19 declaration under Section 564(b)(1) of the Act, 21 U.S.C.section 360bbb-3(b)(1), unless the authorization is terminated  or revoked sooner.       Influenza A by PCR NEGATIVE NEGATIVE   Influenza B by PCR NEGATIVE NEGATIVE    Comment: (NOTE) The Xpert Xpress SARS-CoV-2/FLU/RSV plus assay is intended as an aid in the diagnosis of influenza from Nasopharyngeal swab specimens and should not be used as a sole basis for treatment. Nasal washings and aspirates are unacceptable for Xpert Xpress SARS-CoV-2/FLU/RSV testing.  Fact Sheet for Patients: BloggerCourse.com  Fact Sheet for Healthcare Providers: SeriousBroker.it  This test is not yet approved or cleared by the [place] FDA and has been authorized for detection and/or diagnosis of SARS-CoV-2 by FDA under an Emergency Use Authorization (EUA). This EUA will remain in effect (meaning this test can be used) for the duration of the COVID-19 declaration under Section 564(b)(1) of the Act, 21 U.S.C. section 360bbb-3(b)(1), unless the authorization is terminated or revoked.  Performed at [facility], [address], [place], [place] [address]   Urinalysis, Complete w Microscopic Urine, Clean Catch     Status:  Abnormal   Collection Time: 10/29/20  1:37 AM  Result Value Ref Range   Color, Urine YELLOW YELLOW   APPearance CLOUDY (A) CLEAR   Specific Gravity, Urine 1.008 1.005 - 1.030   pH 6.0 5.0 - 8.0   Glucose, UA NEGATIVE NEGATIVE mg/dL   Hgb urine dipstick SMALL (A) NEGATIVE  Bilirubin Urine NEGATIVE NEGATIVE   Ketones, ur NEGATIVE NEGATIVE mg/dL   Protein, ur NEGATIVE NEGATIVE mg/dL   Nitrite NEGATIVE NEGATIVE   Leukocytes,Ua LARGE (A) NEGATIVE   RBC / HPF 6-10 0 - 5 RBC/hpf   WBC, UA >50 (H) 0 - 5 WBC/hpf   Bacteria, UA FEW (A) NONE SEEN   WBC Clumps PRESENT     Comment: Performed at [facility], [address], [place], [place] [address]  CBC     Status: Abnormal   Collection Time: 10/29/20  2:22 AM  Result Value Ref Range   WBC 6.8 4.0 - 10.5 K/uL   RBC 3.53 (L) 4.22 - 5.81 MIL/uL   Hemoglobin 10.4 (L) 13.0 - 17.0 g/dL   HCT 72.6 (L) 24.2 - 09.1 %   MCV 94.1 80.0 - 100.0 fL   MCH 29.5 26.0 - 34.0 pg   MCHC 31.3 30.0 - 36.0 g/dL   RDW 15.3 51.4 - 82.5 %   Platelets 118 (L) 150 - 400 K/uL    Comment: Immature Platelet Fraction may be clinically indicated, consider ordering this additional test [id number] CONSISTENT WITH PREVIOUS RESULT REPEATED TO VERIFY    nRBC 0.0 0.0 - 0.2 %    Comment: Performed at [facility], [address] [place], [place] [address]  Comprehensive metabolic panel     Status: Abnormal   Collection Time: 10/29/20  2:22 AM  Result Value Ref Range   Sodium 144 135 - 145 mmol/L   Potassium 4.0 3.5 - 5.1 mmol/L    Comment: DELTA CHECK NOTED   Chloride 115 (H) 98 - 111 mmol/L   CO2 25 22 - 32 mmol/L   Glucose, Bld 98 70 - 99 mg/dL    Comment: Glucose reference range applies only to samples taken after fasting for at least 8 hours.   BUN 13 8 - 23 mg/dL   Creatinine, Ser 1.90 (H) 0.61 - 1.24 mg/dL   Calcium 8.0 (L) 8.9 - 10.3 mg/dL   Total Protein 5.6 (L) 6.5 - 8.1 g/dL   Albumin 2.6 (L) 3.5 - 5.0 g/dL   AST 31 15 - 41 U/L   ALT 21 0  - 44 U/L   Alkaline Phosphatase 40 38 - 126 U/L   Total Bilirubin 0.6 0.3 - 1.2 mg/dL   GFR, Estimated 57 (L) >60 mL/min    Comment: (NOTE) Calculated using the CKD-EPI Creatinine Equation (2021)    Anion gap 4 (L) 5 - 15    Comment: Performed at [facility], [address] [place], [place] [address]  Urine Culture     Status: None   Collection Time: 10/29/20  5:00 PM   Specimen: Urine, Clean Catch  Result Value Ref Range   Specimen Description URINE, CLEAN CATCH    Special Requests Immunocompromised    Culture      NO GROWTH Performed at [facility], [address], [place], [place] [address]    Report Status 10/30/2020 FINAL   CBC     Status: Abnormal   Collection Time: 10/30/20  3:54 AM  Result Value Ref Range   WBC 8.1 4.0 - 10.5 K/uL   RBC 3.82 (L) 4.22 - 5.81 MIL/uL   Hemoglobin 11.3 (L) 13.0 - 17.0 g/dL   HCT 17.4 (L) 20.6 - 61.5 %   MCV 91.6 80.0 - 100.0 fL   MCH 29.6 26.0 - 34.0 pg   MCHC 32.3 30.0 - 36.0 g/dL  RDW 14.4 11.5 - 15.5 %   Platelets 137 (L) 150 - 400 K/uL   nRBC 0.0 0.0 - 0.2 %    Comment: Performed at [facility], [place] [address], [place], [place] [address]  Comprehensive metabolic panel     Status: Abnormal   Collection Time: 10/30/20  3:54 AM  Result Value Ref Range   Sodium 139 135 - 145 mmol/L   Potassium 4.5 3.5 - 5.1 mmol/L   Chloride 108 98 - 111 mmol/L   CO2 26 22 - 32 mmol/L   Glucose, Bld 130 (H) 70 - 99 mg/dL    Comment: Glucose reference range applies only to samples taken after fasting for at least 8 hours.   BUN 11 8 - 23 mg/dL   Creatinine, Ser 1.27 (H) 0.61 - 1.24 mg/dL   Calcium 9.1 8.9 - 10.3 mg/dL   Total Protein 6.5 6.5 - 8.1 g/dL   Albumin 2.9 (L) 3.5 - 5.0 g/dL   AST 25 15 - 41 U/L   ALT 24 0 - 44 U/L   Alkaline Phosphatase 53 38 - 126 U/L   Total Bilirubin 0.2 (L) 0.3 - 1.2 mg/dL   GFR, Estimated >60 >60 mL/min    Comment: (NOTE) Calculated using the CKD-EPI Creatinine Equation (2021)    Anion  gap 5 5 - 15    Comment: Performed at [facility], [place] [address] [place], [place] [address]    Current Facility-Administered Medications  Medication Dose Route Frequency Provider Last Rate Last Admin   acetaminophen (TYLENOL) tablet 650 mg  650 mg Oral Q6H PRN Reubin Milan, MD       Or   acetaminophen (TYLENOL) suppository 650 mg  650 mg Rectal Q6H PRN Reubin Milan, MD       aspirin EC tablet 81 mg  81 mg Oral q morning Kathie Dike, MD   81 mg at 10/30/20 4818   atorvastatin (LIPITOR) tablet 20 mg  20 mg Oral q morning Kathie Dike, MD   20 mg at 10/30/20 5631   benztropine (COGENTIN) tablet 2 mg  2 mg Oral BID Kathie Dike, MD   2 mg at 10/30/20 0852   bictegravir-emtricitabine-tenofovir AF (BIKTARVY) 50-200-25 MG per tablet 1 tablet  1 tablet Oral Daily Kathie Dike, MD   1 tablet at 10/30/20 0853   ceFEPIme (MAXIPIME) 2 g in sodium chloride 0.9 % 100 mL IVPB  2 g Intravenous Q12H [name], RPH 200 mL/hr at 10/30/20 0903 2 g at 10/30/20 0903   famotidine (PEPCID) tablet 20 mg  20 mg Oral BID Kathie Dike, MD   20 mg at 49/70/26 3785   folic acid (FOLVITE) tablet 1 mg  1 mg Oral q morning Kathie Dike, MD   1 mg at 10/30/20 0854   gabapentin (NEURONTIN) capsule 400 mg  400 mg Oral QHS Kathie Dike, MD   400 mg at 10/29/20 2128   gabapentin (NEURONTIN) capsule 700 mg  700 mg Oral Daily Kathie Dike, MD   700 mg at 10/30/20 0853   haloperidol (HALDOL) tablet 10 mg  10 mg Oral Q supper Kathie Dike, MD   10 mg at 10/29/20 1744   lactated ringers 1,000 mL with potassium chloride 20 mEq infusion   Intravenous Continuous Reubin Milan, MD 100 mL/hr at 10/30/20 1334 New Bag at 10/30/20 1334   levETIRAcetam (KEPPRA) tablet 750 mg  750 mg Oral BID Kathie Dike, MD   750 mg at 10/30/20  0853   ondansetron (ZOFRAN) tablet 4 mg  4 mg Oral Q6H PRN Reubin Milan, MD       Or   ondansetron [facility]) injection 4 mg  4 mg Intravenous Q6H  PRN Reubin Milan, MD   4 mg at 10/29/20 1842   senna (SENOKOT) tablet 17.2 mg  2 tablet Oral q morning Kathie Dike, MD       valproic acid (DEPAKENE) 250 MG capsule 1,000 mg  1,000 mg Oral QHS Kathie Dike, MD   1,000 mg at 10/29/20 2128   valproic acid (DEPAKENE) 250 MG capsule 500 mg  500 mg Oral Daily Kathie Dike, MD   500 mg at 10/30/20 9794    Musculoskeletal: Strength & Muscle Tone: decreased Gait & Station:  remains in chair Patient leans: N/A            Psychiatric Specialty Exam:  Presentation  General Appearance: Appropriate for Environment  Eye Contact:Good  Speech:Slurred; Slow  Speech Volume:Normal  Handedness: No data recorded  Mood and Affect  Mood:Euthymic  Affect:Blunt   Thought Process  Thought Processes:Linear  Descriptions of Associations:Circumstantial  Orientation:Full (Time, Place and Person)  Thought Content:Logical (very concrete)  History of Schizophrenia/Schizoaffective disorder:No data recorded Duration of Psychotic Symptoms:No data recorded Hallucinations:Hallucinations: None  Ideas of Reference:None  Suicidal Thoughts:Suicidal Thoughts: No  Homicidal Thoughts:Homicidal Thoughts: No   Sensorium  Memory:Immediate Poor; Recent Poor; Remote Fair  Judgment:Intact  Insight:Shallow   Executive Functions  Concentration:Fair  Attention Span:Fair  Recall:Poor  Fund of Knowledge:Poor  Language:Poor   Psychomotor Activity  Psychomotor Activity:Psychomotor Activity: Decreased   Assets  Assets:Desire for Improvement; Resilience; Social Support; Housing   Sleep  Sleep:Sleep: Fair   Physical Exam: Physical Exam HENT:     Head: Normocephalic and atraumatic.  Eyes:     Extraocular Movements: Extraocular movements intact.     Conjunctiva/sclera: Conjunctivae normal.  Cardiovascular:     Rate and Rhythm: Normal rate.  Pulmonary:     Effort: Pulmonary effort is normal.     Breath sounds:  Normal breath sounds.  Abdominal:     General: Abdomen is flat.  Musculoskeletal:        General: Normal range of motion.  Skin:    General: Skin is warm and dry.  Neurological:     Mental Status: He is alert and oriented to person, place, and time.   Review of Systems  Constitutional:  Negative for chills and fever.  HENT:  Negative for hearing loss.   Eyes:  Negative for blurred vision.  Respiratory:  Negative for cough and wheezing.   Cardiovascular:  Negative for chest pain.  Gastrointestinal:  Negative for abdominal pain.  Neurological:  Negative for dizziness.  Psychiatric/Behavioral:  Negative for hallucinations and suicidal ideas.   Blood pressure 111/70, pulse 69, temperature 98.4 F (36.9 C), temperature source Oral, resp. rate 16, height $RemoveBe'5\' 9"'elolFrsCO$  (1.753 m), weight 85.7 kg, SpO2 100 %. Body mass index is 27.9 kg/m.  Treatment Plan Summary: Medication management Schizoaffective disorder Hx of Low IQ and developmental delay HIV Patient appears psychiatrically stable today; however if the patient has been having more agitation at home and was well controlled on Clozaril, it is best practice to restart this medication and not allow the patient to decompensate into psychosis. Unfortunately patient could not be found on Clozaril REMS. Patient will need to be enrolled and have updated labs in order to restart the medication. 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 HIV labs remain pending.  - Continue Haldol $RemoveBeforeD'10mg'PZQTACVJmBcDZM$  QHS - Continue Cogentin $RemoveBeforeDEI'2mg'YByKtqFbgxBncPpm$  BID - Continue Depakene $RemoveBeforeDEI'500mg'EGDAPrRkIFeEnXdz$  AM and $Remo'1000mg'Sbnem$  QHS PO - Recommend CBC w/ diff for current ANC, to start enrollment process for Clozaril REMS  Seizures Patient also has a hx of seizures. It is possible that patient may have had a seizure w. Post-ictal state presentation being the AMS noted on admission.  - Per primary team paln  Will continue to follow  patient. Disposition: No evidence of imminent risk to self or others at present.   Patient does not met criteria for inpatient psych hospitalization.  PGY-2 Freida Busman, MD 10/30/2020 1:42 PM

## 2020-10-30 NOTE — Progress Notes (Signed)
Received message from Hospitalist that Pharmacy was able to contact OP provider  Ileene Musa, PA who enrolled the patient in Clozaril REMS today. This provider was able to verify and recent [place] of [id number] was reported, today 7/25.  Assessment and Plan Schizoaffective disorder Will restart patient's titration at '25mg'$  QHS today as patient is not Clozaril naive. And if well tolerated will continue upward titration.

## 2020-10-30 NOTE — Telephone Encounter (Signed)
Provider was contacted by Nathan [name], RMA regarding inquiry from patient's group home. Patient's group home states that they have not yet received patient's medication. Patient's medications were sent to preferred pharmacy on 10/25/2020.

## 2020-10-30 NOTE — Progress Notes (Signed)
PROGRESS NOTE    Nathan [name]  [id number] DOB: 13-Sep-1960 DOA: 10/28/2020 PCP: Javier Docker, MD    Brief Narrative:  60 y/o male with history of schizoaffective disorder, HIV, seizure disorder, who resides at group home, brought to the hospital with decreased LOC. There was concern for underlying infection so he was started on broad spectrum antibiotics. Neurology following. LP attempted but was unsuccessful. Clinically, patient improved quickly and became more alert the following day. It was felt that symptoms may be related to UTI, so antibiotics de-escalated.    Assessment & Plan:   Principal Problem:   Acute encephalopathy Active Problems:   HIV infection (HCC)   Hyponatremia   CKD (chronic kidney disease) stage 3, GFR 30-59 ml/min (HCC)   GERD (gastroesophageal reflux disease)   Seizure (HCC)   Schizoaffective disorder (HCC)   Hypothermia   Hypokalemia   Normocytic anemia   Thrombocytopenia (HCC)   Acute metabolic encephalopathy -Unclear etiology -There is not appear to be any obvious infections -initial concerns for possible meningitis/encephalitis. LP attempted in ED but unsuccessful -started on empiric therapy -Neurology was following -patient rapidly improved by the following day -discussed with neurology and it was felt reasonable to de-escalate antibiotics and observe -will discontinue vancomycin, ampicillin and acyclovir -Lumbar puncture was deferred due to improvement of symptoms -Urine culture has shown no growth -We will also discontinue cefepime and monitor -EEG negative -MRI imaging of head negative -UDS negative -Valproic acid level on admission noted to be therapeutic indicating that he was taking his seizure medicines -He did have 1 positive blood culture for gram-positive cocci, suspect this may be a contaminant  HIV -Continued on Biktarvy -CD4 count and RNA pending  Seizure disorder -Continued on Keppra and valproic  acid  Schizoaffective disorder -Patient was taking clozapine and Haldol, but it appears he has run out of these medications at the group home -He had not taken clozapine since 7/13 -Psychiatry consulted and to start the patient back on clozapine, he would need to be enrolled in REMS -This was accomplished by patient's outpatient psychiatric provider -He has been started on low-dose clozapine which will be adjusted up.  Hypokalemia -resolved  AKI on CKD stage 3a -admitted with creatinine of 1.7 -improved to 1.3 with IV fluids -continue to monitor  Thrombocytopenia -chronic  -possibly related to HIV -currently at baseline  GERD -continue on famotidine  Dispo -PT/OT   DVT prophylaxis: SCDs Start: 10/28/20 2345  Code Status: full code Family Communication: no family present Disposition Plan: Status is: Observation.  Anticipate discharge in a.m. back to group home if he remains stable and cleared by psychiatry.  The patient remains OBS appropriate and will d/c before 2 midnights.  Dispo: The patient is from: Group home              Anticipated d/c is to: Group home              Patient currently is not medically stable to d/c.   Difficult to place patient No         Consultants:  Neurology Psychiatry  Procedures:  EEG: no epileptiform discharges or seizures seen  Antimicrobials:  Cefepime 7/23> 7/25 Vancomycin 7/23> 7/24 Acyclovir 7/23> 7/24 Ampicillin 7/23> 7/24   Subjective: Sitting up in chair, awake, denies any complaints.  Says has not had a bowel movement in several days.  Objective: Vitals:   10/29/20 2150 10/30/20 0456 10/30/20 1100 10/30/20 1930  BP: 127/68 123/63 111/70 (!) 144/70  Pulse:  67 68 69 74  Resp: '20 18 16 19  '$ Temp: 98.1 F (36.7 C) 98.2 F (36.8 C) 98.4 F (36.9 C) 98.4 F (36.9 C)  TempSrc: Oral  Oral   SpO2: 99% 99% 100% 100%  Weight:      Height:        Intake/Output Summary (Last 24 hours) at 10/30/2020 1950 Last  data filed at 10/30/2020 0500 Gross per 24 hour  Intake --  Output 4300 ml  Net -4300 ml   Filed Weights   10/28/20 1357  Weight: 85.7 kg    Examination:  General exam: Alert, awake, oriented x 3 Respiratory system: Clear to auscultation. Respiratory effort normal. Cardiovascular system:RRR. No murmurs, rubs, gallops. Gastrointestinal system: Abdomen is nondistended, soft and nontender. No organomegaly or masses felt. Normal bowel sounds heard. Central nervous system: Alert and oriented. No focal neurological deficits. Extremities: No C/C/E, +pedal pulses Skin: No rashes, lesions or ulcers Psychiatry: Judgement and insight appear normal. Mood & affect appropriate.      Data Reviewed: I have personally reviewed following labs and imaging studies  CBC: Recent Labs  Lab 10/28/20 1418 10/29/20 0222 10/30/20 0354 10/30/20 1359  WBC 6.7 6.8 8.1 6.5  NEUTROABS 4.1  --   --  4.5  HGB 10.8* 10.4* 11.3* 9.8*  HCT 33.5* 33.2* 35.0* 31.3*  MCV 91.8 94.1 91.6 94.0  PLT 117* 118* 137* [id number]*   Basic Metabolic Panel: Recent Labs  Lab 10/28/20 1418 10/29/20 0222 10/30/20 0354  NA 138 144 139  K 3.2* 4.0 4.5  CL 106 115* 108  CO2 '24 25 26  '$ GLUCOSE 145* 98 130*  BUN '19 13 11  '$ CREATININE 1.70* 1.39* 1.27*  CALCIUM 8.8* 8.0* 9.1   GFR: Estimated Creatinine Clearance: 65.4 mL/min (A) (by C-G formula based on SCr of 1.27 mg/dL (H)). Liver Function Tests: Recent Labs  Lab 10/28/20 1418 10/29/20 0222 10/30/20 0354  AST 44* 31 25  ALT '29 21 24  '$ ALKPHOS 45 40 53  BILITOT 0.4 0.6 0.2*  PROT 6.6 5.6* 6.5  ALBUMIN 3.3* 2.6* 2.9*   No results for input(s): LIPASE, AMYLASE in the last 168 hours. Recent Labs  Lab 10/28/20 1418  AMMONIA 33   Coagulation Profile: No results for input(s): INR, PROTIME in the last 168 hours. Cardiac Enzymes: No results for input(s): CKTOTAL, CKMB, CKMBINDEX, TROPONINI in the last 168 hours. BNP (last 3 results) No results for input(s):  PROBNP in the last 8760 hours. HbA1C: No results for input(s): HGBA1C in the last 72 hours. CBG: Recent Labs  Lab 10/28/20 1358  GLUCAP 143*   Lipid Profile: No results for input(s): CHOL, HDL, LDLCALC, TRIG, CHOLHDL, LDLDIRECT in the last 72 hours. Thyroid Function Tests: No results for input(s): TSH, T4TOTAL, FREET4, T3FREE, THYROIDAB in the last 72 hours. Anemia Panel: No results for input(s): VITAMINB12, FOLATE, FERRITIN, TIBC, IRON, RETICCTPCT in the last 72 hours. Sepsis Labs: Recent Labs  Lab 10/28/20 1418 10/28/20 1552  LATICACIDVEN 1.7 1.6    Recent Results (from the past 240 hour(s))  Blood culture (routine x 2)     Status: None (Preliminary result)   Collection Time: 10/28/20  5:10 PM   Specimen: BLOOD LEFT FOREARM  Result Value Ref Range Status   Specimen Description BLOOD LEFT FOREARM  Final   Special Requests   Final    BOTTLES DRAWN AEROBIC AND ANAEROBIC Blood Culture adequate volume   Culture  Setup Time   Final    GRAM POSITIVE COCCI  AEROBIC BOTTLE ONLY CRITICAL RESULT CALLED TO, READ BACK BY AND VERIFIED WITH: Vena Austria E.MARTIN ON [id number] AT [id number] BY E.PARRISH    Culture   Final    GRAM POSITIVE COCCI IDENTIFICATION TO FOLLOW Performed at [facility], [place] [address], [place], [place] [address]    Report Status PENDING  Incomplete  Blood Culture ID Panel (Reflexed)     Status: Abnormal   Collection Time: 10/28/20  5:10 PM  Result Value Ref Range Status   Enterococcus faecalis NOT DETECTED NOT DETECTED Final   Enterococcus Faecium NOT DETECTED NOT DETECTED Final   Listeria monocytogenes NOT DETECTED NOT DETECTED Final   Staphylococcus species DETECTED (A) NOT DETECTED Final    Comment: CRITICAL RESULT CALLED TO, READ BACK BY AND VERIFIED WITH: Vena Austria E.MARTIN ON [id number] AT 1714 BY E.PARRISH    Staphylococcus aureus (BCID) NOT DETECTED NOT DETECTED Final   Staphylococcus epidermidis NOT DETECTED NOT DETECTED Final   Staphylococcus  lugdunensis NOT DETECTED NOT DETECTED Final   Streptococcus species NOT DETECTED NOT DETECTED Final   Streptococcus agalactiae NOT DETECTED NOT DETECTED Final   Streptococcus pneumoniae NOT DETECTED NOT DETECTED Final   Streptococcus pyogenes NOT DETECTED NOT DETECTED Final   A.calcoaceticus-baumannii NOT DETECTED NOT DETECTED Final   Bacteroides fragilis NOT DETECTED NOT DETECTED Final   Enterobacterales NOT DETECTED NOT DETECTED Final   Enterobacter cloacae complex NOT DETECTED NOT DETECTED Final   Escherichia coli NOT DETECTED NOT DETECTED Final   Klebsiella aerogenes NOT DETECTED NOT DETECTED Final   Klebsiella oxytoca NOT DETECTED NOT DETECTED Final   Klebsiella pneumoniae NOT DETECTED NOT DETECTED Final   Proteus species NOT DETECTED NOT DETECTED Final   Salmonella species NOT DETECTED NOT DETECTED Final   Serratia marcescens NOT DETECTED NOT DETECTED Final   Haemophilus influenzae NOT DETECTED NOT DETECTED Final   Neisseria meningitidis NOT DETECTED NOT DETECTED Final   Pseudomonas aeruginosa NOT DETECTED NOT DETECTED Final   Stenotrophomonas maltophilia NOT DETECTED NOT DETECTED Final   Candida albicans NOT DETECTED NOT DETECTED Final   Candida auris NOT DETECTED NOT DETECTED Final   Candida glabrata NOT DETECTED NOT DETECTED Final   Candida krusei NOT DETECTED NOT DETECTED Final   Candida parapsilosis NOT DETECTED NOT DETECTED Final   Candida tropicalis NOT DETECTED NOT DETECTED Final   Cryptococcus neoformans/gattii NOT DETECTED NOT DETECTED Final    Comment: Performed at [facility], [address] [place], [place] [address]  Blood culture (routine x 2)     Status: None (Preliminary result)   Collection Time: 10/28/20  5:15 PM   Specimen: BLOOD  Result Value Ref Range Status   Specimen Description BLOOD LEFT ANTECUBITAL  Final   Special Requests   Final    BOTTLES DRAWN AEROBIC AND ANAEROBIC Blood Culture adequate volume   Culture   Final    NO GROWTH 2  DAYS Performed at [facility], [place] [address] [place], [place] [address]    Report Status PENDING  Incomplete  Resp Panel by RT-PCR (Flu A&B, Covid) Nasopharyngeal Swab     Status: None   Collection Time: 10/29/20 12:15 AM   Specimen: Nasopharyngeal Swab; Nasopharyngeal(NP) swabs in vial transport medium  Result Value Ref Range Status   SARS Coronavirus 2 by RT PCR NEGATIVE NEGATIVE Final    Comment: (NOTE) SARS-CoV-2 target nucleic acids are NOT DETECTED.  The SARS-CoV-2 RNA is generally detectable in upper respiratory specimens during the acute phase of infection. The  lowest concentration of SARS-CoV-2 viral copies this assay can detect is 138 copies/mL. A negative result does not preclude SARS-Cov-2 infection and should not be used as the sole basis for treatment or other patient management decisions. A negative result may occur with  improper specimen collection/handling, submission of specimen other than nasopharyngeal swab, presence of viral mutation(s) within the areas targeted by this assay, and inadequate number of viral copies(<138 copies/mL). A negative result must be combined with clinical observations, patient history, and epidemiological information. The expected result is Negative.  Fact Sheet for Patients:  EntrepreneurPulse.com.au  Fact Sheet for Healthcare Providers:  IncredibleEmployment.be  This test is no t yet approved or cleared by the [place] FDA and  has been authorized for detection and/or diagnosis of SARS-CoV-2 by FDA under an Emergency Use Authorization (EUA). This EUA will remain  in effect (meaning this test can be used) for the duration of the COVID-19 declaration under Section 564(b)(1) of the Act, 21 U.S.C.section 360bbb-3(b)(1), unless the authorization is terminated  or revoked sooner.       Influenza A by PCR NEGATIVE NEGATIVE Final   Influenza B by PCR NEGATIVE NEGATIVE Final    Comment:  (NOTE) The Xpert Xpress SARS-CoV-2/FLU/RSV plus assay is intended as an aid in the diagnosis of influenza from Nasopharyngeal swab specimens and should not be used as a sole basis for treatment. Nasal washings and aspirates are unacceptable for Xpert Xpress SARS-CoV-2/FLU/RSV testing.  Fact Sheet for Patients: EntrepreneurPulse.com.au  Fact Sheet for Healthcare Providers: IncredibleEmployment.be  This test is not yet approved or cleared by the [place] FDA and has been authorized for detection and/or diagnosis of SARS-CoV-2 by FDA under an Emergency Use Authorization (EUA). This EUA will remain in effect (meaning this test can be used) for the duration of the COVID-19 declaration under Section 564(b)(1) of the Act, 21 U.S.C. section 360bbb-3(b)(1), unless the authorization is terminated or revoked.  Performed at [facility], [place] [address], [place], [place] [address]   Urine Culture     Status: None   Collection Time: 10/29/20  5:00 PM   Specimen: Urine, Clean Catch  Result Value Ref Range Status   Specimen Description URINE, CLEAN CATCH  Final   Special Requests Immunocompromised  Final   Culture   Final    NO GROWTH Performed at [facility], [place] [address] [place], [place] [address]    Report Status 10/30/2020 FINAL  Final         Radiology Studies: MR ANGIO HEAD WO CONTRAST  Result Date: 10/28/2020 CLINICAL DATA:  Transient ischemic attack EXAM: MRI HEAD WITHOUT AND WITH CONTRAST MRA HEAD WITHOUT CONTRAST MRA NECK WITHOUT CONTRAST TECHNIQUE: Multiplanar, multiecho pulse sequences of the brain and surrounding structures were obtained without and with intravenous contrast. Angiographic images of the Circle of Willis were obtained using MRA technique without intravenous contrast. Angiographic images of the neck were obtained using MRA technique without intravenous contrast. Carotid stenosis measurements (when  applicable) are obtained utilizing NASCET criteria, using the distal internal carotid diameter as the denominator. CONTRAST:  8.15m GADAVIST GADOBUTROL 1 MMOL/ML IV SOLN COMPARISON:  None. FINDINGS: MRI HEAD FINDINGS Brain: No acute infarct, mass effect or extra-axial collection. No acute or chronic hemorrhage. Normal white matter signal, parenchymal volume and CSF spaces. The midline structures are normal. There is no abnormal contrast enhancement. Vascular: Major flow voids are preserved. Skull and upper cervical spine: Normal calvarium and skull base. Visualized upper cervical spine and soft  tissues are normal. Sinuses/Orbits:Opacification of the left frontal sinus. Normal orbits. MRA HEAD FINDINGS POSTERIOR CIRCULATION: --Vertebral arteries: Normal --Inferior cerebellar arteries: Normal. --Basilar artery: Normal. --Superior cerebellar arteries: Normal. --Posterior cerebral arteries: Normal. ANTERIOR CIRCULATION: --Intracranial internal carotid arteries: Normal. --Anterior cerebral arteries (ACA): Normal. --Middle cerebral arteries (MCA): Normal. ANATOMIC VARIANTS: Bilateral posterior communicating artery patency. MRA NECK FINDINGS Right dominant vertebral arteries. Diminutive left vertebral artery. Carotid systems are normal. IMPRESSION: 1. Normal MRI of the brain. 2. Normal MRA of the head and neck. Electronically Signed   By: Ulyses Jarred M.D.   On: 10/28/2020 22:02   MR ANGIO NECK WO CONTRAST  Result Date: 10/28/2020 CLINICAL DATA:  Transient ischemic attack EXAM: MRI HEAD WITHOUT AND WITH CONTRAST MRA HEAD WITHOUT CONTRAST MRA NECK WITHOUT CONTRAST TECHNIQUE: Multiplanar, multiecho pulse sequences of the brain and surrounding structures were obtained without and with intravenous contrast. Angiographic images of the Circle of Willis were obtained using MRA technique without intravenous contrast. Angiographic images of the neck were obtained using MRA technique without intravenous contrast. Carotid  stenosis measurements (when applicable) are obtained utilizing NASCET criteria, using the distal internal carotid diameter as the denominator. CONTRAST:  8.67m GADAVIST GADOBUTROL 1 MMOL/ML IV SOLN COMPARISON:  None. FINDINGS: MRI HEAD FINDINGS Brain: No acute infarct, mass effect or extra-axial collection. No acute or chronic hemorrhage. Normal white matter signal, parenchymal volume and CSF spaces. The midline structures are normal. There is no abnormal contrast enhancement. Vascular: Major flow voids are preserved. Skull and upper cervical spine: Normal calvarium and skull base. Visualized upper cervical spine and soft tissues are normal. Sinuses/Orbits:Opacification of the left frontal sinus. Normal orbits. MRA HEAD FINDINGS POSTERIOR CIRCULATION: --Vertebral arteries: Normal --Inferior cerebellar arteries: Normal. --Basilar artery: Normal. --Superior cerebellar arteries: Normal. --Posterior cerebral arteries: Normal. ANTERIOR CIRCULATION: --Intracranial internal carotid arteries: Normal. --Anterior cerebral arteries (ACA): Normal. --Middle cerebral arteries (MCA): Normal. ANATOMIC VARIANTS: Bilateral posterior communicating artery patency. MRA NECK FINDINGS Right dominant vertebral arteries. Diminutive left vertebral artery. Carotid systems are normal. IMPRESSION: 1. Normal MRI of the brain. 2. Normal MRA of the head and neck. Electronically Signed   By: KUlyses JarredM.D.   On: 10/28/2020 22:02   MR BRAIN W WO CONTRAST  Result Date: 10/28/2020 CLINICAL DATA:  Transient ischemic attack EXAM: MRI HEAD WITHOUT AND WITH CONTRAST MRA HEAD WITHOUT CONTRAST MRA NECK WITHOUT CONTRAST TECHNIQUE: Multiplanar, multiecho pulse sequences of the brain and surrounding structures were obtained without and with intravenous contrast. Angiographic images of the Circle of Willis were obtained using MRA technique without intravenous contrast. Angiographic images of the neck were obtained using MRA technique without intravenous  contrast. Carotid stenosis measurements (when applicable) are obtained utilizing NASCET criteria, using the distal internal carotid diameter as the denominator. CONTRAST:  8.553mGADAVIST GADOBUTROL 1 MMOL/ML IV SOLN COMPARISON:  None. FINDINGS: MRI HEAD FINDINGS Brain: No acute infarct, mass effect or extra-axial collection. No acute or chronic hemorrhage. Normal white matter signal, parenchymal volume and CSF spaces. The midline structures are normal. There is no abnormal contrast enhancement. Vascular: Major flow voids are preserved. Skull and upper cervical spine: Normal calvarium and skull base. Visualized upper cervical spine and soft tissues are normal. Sinuses/Orbits:Opacification of the left frontal sinus. Normal orbits. MRA HEAD FINDINGS POSTERIOR CIRCULATION: --Vertebral arteries: Normal --Inferior cerebellar arteries: Normal. --Basilar artery: Normal. --Superior cerebellar arteries: Normal. --Posterior cerebral arteries: Normal. ANTERIOR CIRCULATION: --Intracranial internal carotid arteries: Normal. --Anterior cerebral arteries (ACA): Normal. --Middle cerebral arteries (MCA): Normal. ANATOMIC VARIANTS: Bilateral posterior communicating  artery patency. MRA NECK FINDINGS Right dominant vertebral arteries. Diminutive left vertebral artery. Carotid systems are normal. IMPRESSION: 1. Normal MRI of the brain. 2. Normal MRA of the head and neck. Electronically Signed   By: Ulyses Jarred M.D.   On: 10/28/2020 22:02   Overnight EEG with video  Result Date: 10/29/2020 Lora Havens, MD     10/29/2020  1:27 PM Patient Name: Nathan [name] MRN: [id number] Epilepsy Attending: Lora Havens Referring Physician/Provider: Dr Su Monks Duration: 10/28/2020 1814 to 10/29/2020 1236 Patient history: 60yo M with ams. EEG to evaluate for seizure Level of alertness: Awake, asleep AEDs during EEG study: LEV, VPA Technical aspects: This EEG study was done with scalp electrodes positioned according to the 10-20  International system of electrode placement. Electrical activity was acquired at a sampling rate of '500Hz'$  and reviewed with a high frequency filter of '70Hz'$  and a low frequency filter of '1Hz'$ . EEG data were recorded continuously and digitally stored. Description: The posterior dominant rhythm consists of 8 Hz activity of moderate voltage (25-35 uV) seen predominantly in posterior head regions, symmetric and reactive to eye opening and eye closing. Sleep was characterized by vertex waves, sleep spindles (12 to 14 Hz), maximal frontocentral region. EEG showed intermittent 3-'5hz'$  theta-delta slowing. Hyperventilation and photic stimulation were not performed.   ABNORMALITY - Intermittent slow, generalized IMPRESSION: This study is suggestive of mild diffuse encephalopathy, nonspecific etiology. No seizures or epileptiform discharges were seen throughout the recording. Priyanka Barbra Sarks        Scheduled Meds:  aspirin EC  81 mg Oral q morning   atorvastatin  20 mg Oral q morning   benztropine  2 mg Oral BID   bictegravir-emtricitabine-tenofovir AF  1 tablet Oral Daily   cloZAPine  25 mg Oral QHS   famotidine  20 mg Oral BID   folic acid  1 mg Oral q morning   gabapentin  400 mg Oral QHS   gabapentin  700 mg Oral Daily   haloperidol  10 mg Oral Q supper   levETIRAcetam  750 mg Oral BID   senna  2 tablet Oral q morning   valproic acid  1,000 mg Oral QHS   valproic acid  500 mg Oral Daily   Continuous Infusions:  lactated ringers 1,000 mL with potassium chloride 20 mEq infusion 100 mL/hr at 10/30/20 1334     LOS: 0 days    Time spent: 23mns    JKathie Dike MD [facility]   If 7PM-7AM, please contact night-coverage [email or web address]  10/30/2020, 7:50 PM

## 2020-10-30 NOTE — Telephone Encounter (Signed)
PATIENT'S GROUP CALLED ABOUT MEDICATION THAT WAS WOULD BE SENT THAT WAS DISCUSSED @ APPOINTMENT. GROUP HOME HASN'T HEARD WHEN MED'S WOULD BE AVAILABLE DUE TO NOT HAVING BEEN SENT.

## 2020-10-30 NOTE — TOC Initial Note (Signed)
Transition of Care [facility]) - Initial/Assessment Note    Patient Details  Name: Nathan [name] MRN: [id number] Date of Birth: 10/18/1960  Transition of Care [facility]) CM/SW Contact:    Bethann Berkshire, [place] Phone Number: 10/30/2020, 2:21 PM  Clinical Narrative:                  CSW called pt Guardian. CSW confirmed pt is living at [facility]. CSW is informed that care home would likely be able to provide transport at time of discharge. Pt's care home manager, Grier Rocher, should be contacted once d/c date is determined.   Expected Discharge Plan: Group Home Barriers to Discharge: Continued Medical Work up   Patient Goals and CMS Choice        Expected Discharge Plan and Services Expected Discharge Plan: Group Home       Living arrangements for the past 2 months: Group Home                                      Prior Living Arrangements/Services Living arrangements for the past 2 months: Group Home Lives with:: Facility Resident                   Activities of Daily Living      Permission Sought/Granted                  Emotional Assessment         Alcohol / Substance Use: Not Applicable Psych Involvement: Yes (comment)  Admission diagnosis:  Altered mental state [R41.82] Pain [R52] Acute encephalopathy [G93.40] Patient Active Problem List   Diagnosis Date Noted   Hypokalemia 10/28/2020   Normocytic anemia 10/28/2020   Thrombocytopenia ([place]) 10/28/2020   Pain    Obtundation    Hypothermia    Insomnia 10/25/2020   Pain due to onychomycosis of toenails of both feet 10/25/2020   Sepsis due to pneumonia ([place]) 02/16/2018   Diarrhea 02/16/2018   Gait abnormality 02/09/2018   Hyperglycemia 08/06/2017   Schizoaffective disorder ([place]) 08/06/2017   Bipolar 1 disorder ([place])    Schizophrenia ([place])    Seizure ([place])    Altered mental state 07/20/2016   Aggressive behavior    GERD (gastroesophageal reflux disease) 07/25/2014   Acute  encephalopathy 06/23/2014   Hypernatremia 06/20/2014   Dehydration 06/18/2014   Lithium toxicity 06/18/2014   ARF (acute renal failure) ([place]) 06/18/2014   Acute urinary retention 06/18/2014   CAP (community acquired pneumonia) 06/18/2014   Encounter for screening colonoscopy 03/25/2014   Dysphagia, pharyngoesophageal phase 03/25/2014   CKD (chronic kidney disease) stage 3, GFR 30-59 ml/min ([place]) 07/22/2012   HIV infection ([place]) 06/19/2012   Bipolar 1 disorder, mixed, moderate ([place]) 06/19/2012   Hyponatremia 06/19/2012   Intellectual disability 06/19/2012   PCP:  Javier Docker, MD Pharmacy:   [facility], [address] [place] [address] Phone: [phone] Fax: [phone]     Social Determinants of Health (SDOH) Interventions    Readmission Risk Interventions No flowsheet data found.

## 2020-10-31 DIAGNOSIS — N1831 Chronic kidney disease, stage 3a: Secondary | ICD-10-CM | POA: Diagnosis not present

## 2020-10-31 DIAGNOSIS — E871 Hypo-osmolality and hyponatremia: Secondary | ICD-10-CM

## 2020-10-31 DIAGNOSIS — G9340 Encephalopathy, unspecified: Secondary | ICD-10-CM | POA: Diagnosis not present

## 2020-10-31 DIAGNOSIS — G9341 Metabolic encephalopathy: Secondary | ICD-10-CM | POA: Diagnosis not present

## 2020-10-31 DIAGNOSIS — B20 Human immunodeficiency virus [HIV] disease: Secondary | ICD-10-CM | POA: Diagnosis not present

## 2020-10-31 DIAGNOSIS — E876 Hypokalemia: Secondary | ICD-10-CM | POA: Diagnosis not present

## 2020-10-31 LAB — CULTURE, BLOOD (ROUTINE X 2): Special Requests: ADEQUATE

## 2020-10-31 LAB — CD4/CD8 (T-HELPER/T-SUPPRESSOR CELL)
CD4 absolute: 519 /uL (ref 400–1790)
CD4%: 30.04 % — ABNORMAL LOW (ref 33–65)
CD8 T Cell Abs: 831 /uL (ref 190–1000)
CD8tox: 48.13 % — ABNORMAL HIGH (ref 12–40)
Ratio: 0.62 — ABNORMAL LOW (ref 1.0–3.0)
Total lymphocyte count: 1727 /uL (ref 1000–4000)

## 2020-10-31 LAB — HIV-1 RNA QUANT-NO REFLEX-BLD
HIV 1 RNA Quant: <20 copies/mL
LOG10 HIV-1 RNA: UNDETERMINED log10copy/mL

## 2020-10-31 MED ORDER — HALOPERIDOL 5 MG PO TABS: 10.0000 mg | ORAL_TABLET | Freq: Every day | ORAL | 2 refills | Status: AC

## 2020-10-31 MED ORDER — LEVETIRACETAM 750 MG PO TABS: 750.0000 mg | ORAL_TABLET | Freq: Two times a day (BID) | ORAL | 4 refills | Status: AC

## 2020-10-31 MED ORDER — CLOZAPINE 50 MG PO TABS: ORAL_TABLET | ORAL | 0 refills | Status: AC

## 2020-10-31 MED ORDER — BENZTROPINE MESYLATE 2 MG PO TABS: 2.0000 mg | ORAL_TABLET | Freq: Two times a day (BID) | ORAL | 2 refills | Status: AC

## 2020-10-31 MED ORDER — VALPROIC ACID 250 MG PO CAPS: ORAL_CAPSULE | ORAL | 2 refills | Status: AC

## 2020-10-31 NOTE — Consult Note (Signed)
[facility] Face-to-Face Psychiatry Consult   Reason for Consult:  Medication mgmt Referring Physician:  Kathie Dike, MD Patient Identification: Nathan [name] MRN:  [id number] Principal Diagnosis: Acute encephalopathy Diagnosis:  Principal Problem:   Acute encephalopathy Active Problems:   HIV infection ([place])   Hyponatremia   CKD (chronic kidney disease) stage 3, GFR 30-59 ml/min (HCC)   GERD (gastroesophageal reflux disease)   Seizure ([place])   Schizoaffective disorder ([place])   Hypothermia   Hypokalemia   Normocytic anemia   Thrombocytopenia ([place])   Total Time spent with patient: 15 minutes  Subjective:   Nathan [name] is a 60 y.o. male patient admitted with history of schizoaffective disorder, HIV, seizure disorder, who resides at group home, brought to the hospital from his group home with decreased LOC .  HPI:  On assessment this AM patient is up to chair. RN has no events to report. Patient reports that he feels fine and he is sleeping and eating well. Patient does request some snacks. Otherwise patient denies SI, HI, and AVH. No tremor again was seen on presentation and no reports of tremor this hospitalization.  Past Psychiatric History: Schizoaffective disorder Per EMR review patient had exaggerated tremor since being off the Clozaril and patient was also exhibiting random outburst and appeared abnormally agitated.    Risk to Self:  NO Risk to Others:  NO Prior Inpatient Therapy:   YES Prior Outpatient Therapy:  YES  Past Medical History:  Past Medical History:  Diagnosis Date   Anemia    Anemia    Anxiety    Bipolar 1 disorder ([place])    Depression, major    HIV (human immunodeficiency virus infection) ([place])    Hyperglycemia 08/06/2017   Hypernatremia    Kidney failure    Lithium toxicity    MR (mental retardation)    Schizoaffective disorder ([place]) 08/06/2017   Schizophrenia ([place])    Seizures ([place])     Past Surgical History:  Procedure Laterality Date    BIOPSY N/A 04/12/2014   Procedure: BIOPSY;  Surgeon: Danie Binder, MD;  Location: [facility];  Service: Endoscopy;  Laterality: N/A;  Gastric   COLONOSCOPY WITH PROPOFOL N/A 04/12/2014   SLF:  1. one colon polyp removed 2. The left colon is extremely redundant 3. Small internal hemorrhoids   ESOPHAGOGASTRODUODENOSCOPY (EGD) WITH PROPOFOL N/A 04/12/2014   SLF: Stricture at the gastroesophageal junction 2. small polyp  in teh gastric body 3. Mild Non-erosive gastritis.    NOSE SURGERY  1968   unsure what type of surgery. something to help him breathe better   POLYPECTOMY N/A 04/12/2014   Procedure: POLYPECTOMY;  Surgeon: Danie Binder, MD;  Location: [facility];  Service: Endoscopy;  Laterality: N/A;  Cecal   SAVORY DILATION N/A 04/12/2014   Procedure: SAVORY DILATION;  Surgeon: Danie Binder, MD;  Location: [facility];  Service: Endoscopy;  Laterality: N/A;  12.8/ 14/15/16   SKIN BIOPSY     Family History:  Family History  Problem Relation Age of Onset   Other Mother        unknown medical history   Other Father        unknown medical history   Colon cancer Neg Hx    Family Psychiatric  History: Unknown Social History:  Social History   Substance and Sexual Activity  Alcohol Use No   Alcohol/week: 0.0 standard drinks     Social History   Substance and Sexual Activity  Drug Use No  Social History   Socioeconomic History   Marital status: Single    Spouse name: Not on file   Number of children: 0   Years of education: 9th grade   Highest education level: Not on file  Occupational History   Occupation: Disabled  Tobacco Use   Smoking status: Never   Smokeless tobacco: Never  Vaping Use   Vaping Use: Never used  Substance and Sexual Activity   Alcohol use: No    Alcohol/week: 0.0 standard drinks   Drug use: No   Sexual activity: Never    Birth control/protection: Abstinence    Comment: [place]  Other Topics Concern   Not on file  Social History Narrative   Lives in  group home.   Right-handed.   2-3 sodas per day.   Social Determinants of Health   Financial Resource Strain: Not on file  Food Insecurity: Not on file  Transportation Needs: Not on file  Physical Activity: Not on file  Stress: Not on file  Social Connections: Not on file   Additional Social History:    Allergies:  No Known Allergies  Labs:  Results for orders placed or performed during the hospital encounter of 10/28/20 (from the past 48 hour(s))  Urine Culture     Status: None   Collection Time: 10/29/20  5:00 PM   Specimen: Urine, Clean Catch  Result Value Ref Range   Specimen Description URINE, CLEAN CATCH    Special Requests Immunocompromised    Culture      NO GROWTH Performed at [facility], [place] [address], [place], [place] [address]    Report Status 10/30/2020 FINAL   CBC     Status: Abnormal   Collection Time: 10/30/20  3:54 AM  Result Value Ref Range   WBC 8.1 4.0 - 10.5 K/uL   RBC 3.82 (L) 4.22 - 5.81 MIL/uL   Hemoglobin 11.3 (L) 13.0 - 17.0 g/dL   HCT 35.0 (L) 39.0 - 52.0 %   MCV 91.6 80.0 - 100.0 fL   MCH 29.6 26.0 - 34.0 pg   MCHC 32.3 30.0 - 36.0 g/dL   RDW 14.4 11.5 - 15.5 %   Platelets 137 (L) 150 - 400 K/uL   nRBC 0.0 0.0 - 0.2 %    Comment: Performed at [facility], [place] [address] [place], [place] [address]  Comprehensive metabolic panel     Status: Abnormal   Collection Time: 10/30/20  3:54 AM  Result Value Ref Range   Sodium 139 135 - 145 mmol/L   Potassium 4.5 3.5 - 5.1 mmol/L   Chloride 108 98 - 111 mmol/L   CO2 26 22 - 32 mmol/L   Glucose, Bld 130 (H) 70 - 99 mg/dL    Comment: Glucose reference range applies only to samples taken after fasting for at least 8 hours.   BUN 11 8 - 23 mg/dL   Creatinine, Ser 1.27 (H) 0.61 - 1.24 mg/dL   Calcium 9.1 8.9 - 10.3 mg/dL   Total Protein 6.5 6.5 - 8.1 g/dL   Albumin 2.9 (L) 3.5 - 5.0 g/dL   AST 25 15 - 41 U/L   ALT 24 0 - 44 U/L   Alkaline Phosphatase 53 38 - 126 U/L   Total  Bilirubin 0.2 (L) 0.3 - 1.2 mg/dL   GFR, Estimated >60 >60 mL/min    Comment: (NOTE) Calculated using the CKD-EPI Creatinine Equation (2021)    Anion gap 5 5 - 15  Comment: Performed at [facility], [place] [address] [place], [place] [address]  CBC with Differential/Platelet     Status: Abnormal   Collection Time: 10/30/20  1:59 PM  Result Value Ref Range   WBC 6.5 4.0 - 10.5 K/uL   RBC 3.33 (L) 4.22 - 5.81 MIL/uL   Hemoglobin 9.8 (L) 13.0 - 17.0 g/dL   HCT 31.3 (L) 39.0 - 52.0 %   MCV 94.0 80.0 - 100.0 fL   MCH 29.4 26.0 - 34.0 pg   MCHC 31.3 30.0 - 36.0 g/dL   RDW 14.5 11.5 - 15.5 %   Platelets 123 (L) 150 - 400 K/uL   nRBC 0.0 0.0 - 0.2 %   Neutrophils Relative % 68 %   Neutro Abs 4.5 1.7 - 7.7 K/uL   Lymphocytes Relative 18 %   Lymphs Abs 1.2 0.7 - 4.0 K/uL   Monocytes Relative 8 %   Monocytes Absolute 0.5 0.1 - 1.0 K/uL   Eosinophils Relative 5 %   Eosinophils Absolute 0.3 0.0 - 0.5 K/uL   Basophils Relative 1 %   Basophils Absolute 0.0 0.0 - 0.1 K/uL   Immature Granulocytes 0 %   Abs Immature Granulocytes 0.02 0.00 - 0.07 K/uL    Comment: Performed at [facility] [address], [place], [place] [address]    Current Facility-Administered Medications  Medication Dose Route Frequency Provider Last Rate Last Admin   acetaminophen (TYLENOL) tablet 650 mg  650 mg Oral Q6H PRN Reubin Milan, MD       Or   acetaminophen (TYLENOL) suppository 650 mg  650 mg Rectal Q6H PRN Reubin Milan, MD       aspirin EC tablet 81 mg  81 mg Oral q morning Kathie Dike, MD   81 mg at 10/31/20 [id number]   atorvastatin (LIPITOR) tablet 20 mg  20 mg Oral q morning Kathie Dike, MD   20 mg at 10/31/20 [id number]   benztropine (COGENTIN) tablet 2 mg  2 mg Oral BID Kathie Dike, MD   2 mg at 10/31/20 0813   bictegravir-emtricitabine-tenofovir AF (BIKTARVY) 50-200-25 MG per tablet 1 tablet  1 tablet Oral Daily Kathie Dike, MD   1 tablet at 10/31/20 0813   cloZAPine  (CLOZARIL) tablet 25 mg  25 mg Oral QHS Damita Dunnings B, MD   25 mg at 10/30/20 2029   famotidine (PEPCID) tablet 20 mg  20 mg Oral BID Kathie Dike, MD   20 mg at [id number]   folic acid (FOLVITE) tablet 1 mg  1 mg Oral q morning Kathie Dike, MD   1 mg at 10/31/20 [id number]   gabapentin (NEURONTIN) capsule 400 mg  400 mg Oral QHS Kathie Dike, MD   400 mg at 10/30/20 2028   gabapentin (NEURONTIN) capsule 700 mg  700 mg Oral Daily Kathie Dike, MD   700 mg at 10/31/20 0813   haloperidol (HALDOL) tablet 10 mg  10 mg Oral Q supper Kathie Dike, MD   10 mg at 10/30/20 1605   levETIRAcetam (KEPPRA) tablet 750 mg  750 mg Oral BID Kathie Dike, MD   750 mg at 10/31/20 0813   ondansetron (ZOFRAN) tablet 4 mg  4 mg Oral Q6H PRN Reubin Milan, MD       Or   ondansetron [facility]) injection 4 mg  4 mg Intravenous Q6H PRN Reubin Milan, MD   4 mg at 10/29/20 1842   senna (SENOKOT) tablet 17.2 mg  2 tablet Oral q morning Kathie Dike, MD   17.2 mg at 10/31/20 0813   valproic acid (DEPAKENE) 250 MG capsule 1,000 mg  1,000 mg Oral QHS Kathie Dike, MD   1,000 mg at 10/30/20 2030   valproic acid (DEPAKENE) 250 MG capsule 500 mg  500 mg Oral Daily Kathie Dike, MD   500 mg at 10/31/20 0813    Musculoskeletal: Strength & Muscle Tone: decreased Gait & Station:  remains in chair Patient leans: N/A            Psychiatric Specialty Exam:  Presentation  General Appearance: Appropriate for Environment  Eye Contact:Good  Speech:Slurred (missing teeth, overall understandable)  Speech Volume:Normal  Handedness: No data recorded  Mood and Affect  Mood:Euthymic  Affect:Constricted   Thought Process  Thought Processes:Linear  Descriptions of Associations:Circumstantial  Orientation:Full (Time, Place and Person)  Thought Content:Logical (limited)  History of Schizophrenia/Schizoaffective disorder:No data recorded Duration of Psychotic Symptoms:No data  recorded Hallucinations:Hallucinations: None  Ideas of Reference:None  Suicidal Thoughts:Suicidal Thoughts: No  Homicidal Thoughts:Homicidal Thoughts: No   Sensorium  Memory:Immediate Fair; Recent Poor; Remote Poor  Judgment:Fair  Insight:None   Executive Functions  Concentration:Fair  Attention Span:Fair  Recall:Poor  Fund of Knowledge:Poor  Language:Poor   Psychomotor Activity  Psychomotor Activity:Psychomotor Activity: Decreased   Assets  Assets:Resilience   Sleep  Sleep:Sleep: Good   Physical Exam: Physical Exam Constitutional:      Appearance: He is not ill-appearing.  HENT:     Head: Normocephalic and atraumatic.  Eyes:     Extraocular Movements: Extraocular movements intact.     Conjunctiva/sclera: Conjunctivae normal.  Cardiovascular:     Rate and Rhythm: Normal rate.  Pulmonary:     Effort: Pulmonary effort is normal.  Abdominal:     General: Abdomen is flat.  Musculoskeletal:        General: Normal range of motion.  Skin:    General: Skin is warm and dry.  Neurological:     Mental Status: He is alert and oriented to person, place, and time.   Review of Systems  Constitutional:  Negative for chills and fever.  HENT:  Negative for hearing loss.   Eyes:  Negative for blurred vision.  Respiratory:  Negative for cough and wheezing.   Cardiovascular:  Negative for chest pain.  Gastrointestinal:  Negative for abdominal pain.  Neurological:  Negative for dizziness.  Psychiatric/Behavioral:  Negative for depression, hallucinations and suicidal ideas.   Blood pressure (!) 138/58, pulse 75, temperature 98.5 F (36.9 C), temperature source Oral, resp. rate 18, height '5\' 9"'$  (1.753 m), weight 85.7 kg, SpO2 100 %. Body mass index is 27.9 kg/m.  Treatment Plan Summary: Medication management Schizoaffective disorder Hx of Low IQ and developmental delay HIV Patient is limited at baseline, leading to limited exam; however he appears to be  having no behavioral issues and staff has not noted patient exhibiting any signs of psychosis. Patient continues to appear psychiatrically stable.  - Continue Haldol '10mg'$  QHS - Continue Cogentin '2mg'$  BID - Continue Depakene '500mg'$  AM and '1000mg'$  QHS PO - Clozaril Titration schedule: 7/26 '75mg'$  QHS 7/27 '25mg'$  AM and '100mg'$  QHS 7/28 '75mg'$  AM and '100mg'$  QHS 7/29 '125mg'$  AM and '100mg'$  QHS 7/30 175 mg AM and '100mg'$  QHS 7/31 '200mg'$  AM and '100mg'$  QHS, continue unless OP Provider prescribes otherwise  Patient is determined to be psychiatrically stable at this time. Psychiatry will sign off. Please do not hesitate to call back if questions arise. Thank you for  this consult.   Disposition: No evidence of imminent risk to self or others at present.   Patient does not meet inpatient psychiatric hospitalization criteria.  PGY-2 Freida Busman, MD 10/31/2020 10:19 AM

## 2020-10-31 NOTE — NC FL2 (Signed)
[place] LEVEL OF CARE SCREENING TOOL     IDENTIFICATION  Patient Name: Nathan [name] Birthdate: [birth year] Sex: male Admission Date (Current Location): 10/28/2020  [facility] and [place] Number:  Herbalist and Address:  [facility], [place] [address], [place], [place] [address]      Provider Number: [id number]  Attending Physician Name and Address:  Kathie Dike, MD  Relative Name and Phone Number:  Linna Darner (Legal Guardian)   [phone] (Work Phone)    Current Level of Care: Hospital Recommended Level of Care: [facility] Prior Approval Number:    Date Approved/Denied:   PASRR Number:    Discharge Plan: Domiciliary (Rest home)    Current Diagnoses: Patient Active Problem List   Diagnosis Date Noted   Hypokalemia 10/28/2020   Normocytic anemia 10/28/2020   Thrombocytopenia ([place]) 10/28/2020   Pain    Obtundation    Hypothermia    Insomnia 10/25/2020   Pain due to onychomycosis of toenails of both feet 10/25/2020   Sepsis due to pneumonia ([place]) 02/16/2018   Diarrhea 02/16/2018   Gait abnormality 02/09/2018   Hyperglycemia 08/06/2017   Schizoaffective disorder ([place]) 08/06/2017   Bipolar 1 disorder ([place])    Schizophrenia ([place])    Seizure ([place])    Altered mental state 07/20/2016   Aggressive behavior    GERD (gastroesophageal reflux disease) 07/25/2014   Acute encephalopathy 06/23/2014   Hypernatremia 06/20/2014   Dehydration 06/18/2014   Lithium toxicity 06/18/2014   ARF (acute renal failure) ([place]) 06/18/2014   Acute urinary retention 06/18/2014   CAP (community acquired pneumonia) 06/18/2014   Encounter for screening colonoscopy 03/25/2014   Dysphagia, pharyngoesophageal phase 03/25/2014   CKD (chronic kidney disease) stage 3, GFR 30-59 ml/min (HCC) 07/22/2012   HIV infection ([place]) 06/19/2012   Bipolar 1 disorder, mixed, moderate ([place]) 06/19/2012   Hyponatremia 06/19/2012   Intellectual  disability 06/19/2012    Orientation RESPIRATION BLADDER Height & Weight     Self, Time, Situation, Place  Normal Incontinent Weight: 188 lb 15 oz (85.7 kg) Height:  '5\' 9"'$  (175.3 cm)  BEHAVIORAL SYMPTOMS/MOOD NEUROLOGICAL BOWEL NUTRITION STATUS      Continent Diet (regular diet)  AMBULATORY STATUS COMMUNICATION OF NEEDS Skin   Independent Verbally Normal                       Personal Care Assistance Level of Assistance  Bathing, Feeding, Dressing Bathing Assistance: Independent Feeding assistance: Independent Dressing Assistance: Independent     Functional Limitations Info  Sight, Hearing, Speech Sight Info: Adequate Hearing Info: Adequate Speech Info: Adequate    SPECIAL CARE FACTORS FREQUENCY                       Contractures Contractures Info: Not present    Additional Factors Info  Code Status, Allergies Code Status Info: Full code Allergies Info: No known allergies           Current Medications (10/31/2020):  This is the current hospital active medication list Current Facility-Administered Medications  Medication Dose Route Frequency Provider Last Rate Last Admin   acetaminophen (TYLENOL) tablet 650 mg  650 mg Oral Q6H PRN Reubin Milan, MD       Or   acetaminophen (TYLENOL) suppository 650 mg  650 mg Rectal Q6H PRN Reubin Milan, MD       aspirin EC tablet 81 mg  81 mg Oral q morning  Kathie Dike, MD   81 mg at 10/31/20 [id number]   atorvastatin (LIPITOR) tablet 20 mg  20 mg Oral q morning Kathie Dike, MD   20 mg at 10/31/20 [id number]   benztropine (COGENTIN) tablet 2 mg  2 mg Oral BID Kathie Dike, MD   2 mg at 10/31/20 0813   bictegravir-emtricitabine-tenofovir AF (BIKTARVY) 50-200-25 MG per tablet 1 tablet  1 tablet Oral Daily Kathie Dike, MD   1 tablet at 10/31/20 0813   cloZAPine (CLOZARIL) tablet 25 mg  25 mg Oral QHS Damita Dunnings B, MD   25 mg at 10/30/20 2029   famotidine (PEPCID) tablet 20 mg  20 mg Oral BID Kathie Dike, MD   20 mg at [id number]   folic acid (FOLVITE) tablet 1 mg  1 mg Oral q morning Kathie Dike, MD   1 mg at 10/31/20 [id number]   gabapentin (NEURONTIN) capsule 400 mg  400 mg Oral QHS Kathie Dike, MD   400 mg at 10/30/20 2028   gabapentin (NEURONTIN) capsule 700 mg  700 mg Oral Daily Kathie Dike, MD   700 mg at 10/31/20 0813   haloperidol (HALDOL) tablet 10 mg  10 mg Oral Q supper Kathie Dike, MD   10 mg at 10/30/20 1605   levETIRAcetam (KEPPRA) tablet 750 mg  750 mg Oral BID Kathie Dike, MD   750 mg at 10/31/20 0813   ondansetron (ZOFRAN) tablet 4 mg  4 mg Oral Q6H PRN Reubin Milan, MD       Or   ondansetron [facility]) injection 4 mg  4 mg Intravenous Q6H PRN Reubin Milan, MD   4 mg at 10/29/20 1842   senna (SENOKOT) tablet 17.2 mg  2 tablet Oral q morning Kathie Dike, MD   17.2 mg at 10/31/20 0813   valproic acid (DEPAKENE) 250 MG capsule 1,000 mg  1,000 mg Oral QHS Kathie Dike, MD   1,000 mg at 10/30/20 2030   valproic acid (DEPAKENE) 250 MG capsule 500 mg  500 mg Oral Daily Kathie Dike, MD   500 mg at 10/31/20 [id number]     Discharge Medications: Please see discharge summary for a list of discharge medications.  Relevant Imaging Results:  Relevant Lab Results:   Additional Information SSN [place], [place]

## 2020-10-31 NOTE — TOC Progression Note (Signed)
Transition of Care [facility]) - Progression Note    Patient Details  Name: Nathan [name] MRN: [id number] Date of Birth: Feb 14, 1961  [facility]) CM/SW [place], [place] Phone Number: 10/31/2020, 3:46 PM  Clinical Narrative:     CSW called pt's group home; confirmed pt can return. Medications need to be sent to [facility].   CSW also notified pt's guardian.   CSW scheduled transportation via [employer] to group home at [employer] .   Expected Discharge Plan: Group Home Barriers to Discharge: Continued Medical Work up  Expected Discharge Plan and Services Expected Discharge Plan: Group Home       Living arrangements for the past 2 months: Group Home Expected Discharge Date: 10/31/20                                     Social Determinants of Health (SDOH) Interventions    Readmission Risk Interventions No flowsheet data found.

## 2020-10-31 NOTE — Discharge Summary (Signed)
Physician Discharge Summary  Nathan [name] [id number] DOB: Jun 17, 1960 DOA: 10/28/2020  PCP: Javier Docker, MD  Admit date: 10/28/2020 Discharge date: 10/31/2020  Admitted From: Group home Disposition: Group home  Recommendations for Outpatient Follow-up:  Follow up with PCP in 1-2 weeks Please obtain BMP/CBC in one week Follow-up with primary psychiatrist for further adjustment in medications as needed  Home Health: Equipment/Devices:  Discharge Condition: Stable CODE STATUS: Full code Diet recommendation: Heart healthy  Brief/Interim Summary: 60 y/o male with history of schizoaffective disorder, HIV, seizure disorder, who resides at group home, brought to the hospital with decreased LOC. There was concern for underlying infection so he was started on broad spectrum antibiotics. Neurology following. LP attempted but was unsuccessful. Clinically, patient improved quickly and became more alert the following day. It was felt that symptoms may be related to UTI, so antibiotics de-escalated.  Discharge Diagnoses:  Principal Problem:   Acute encephalopathy Active Problems:   HIV infection ([place])   Hyponatremia   CKD (chronic kidney disease) stage 3, GFR 30-59 ml/min (HCC)   GERD (gastroesophageal reflux disease)   Seizure (HCC)   Schizoaffective disorder (HCC)   Hypothermia   Hypokalemia   Normocytic anemia   Thrombocytopenia (HCC)  Acute metabolic encephalopathy -Unclear etiology -There is not appear to be any obvious infections -initial concerns for possible meningitis/encephalitis. LP attempted in ED but unsuccessful -started on empiric therapy -Neurology was following -patient rapidly improved by the following day -discussed with neurology and it was felt reasonable to de-escalate antibiotics and observe -will discontinue vancomycin, ampicillin and acyclovir -Lumbar puncture was deferred due to improvement of symptoms -Urine culture has shown no growth -We  will also discontinue cefepime and monitor -EEG negative -MRI imaging of head negative -UDS negative -Valproic acid level on admission noted to be therapeutic indicating that he was taking his seizure medicines -He did have 1 positive blood culture for staph species, suspect this is a contaminant -Mental status is now back to baseline   HIV -Continued on Biktarvy -CD4 count noted to be 519 -HIV RNA undetectable   Seizure disorder -Continued on Keppra and valproic acid   Schizoaffective disorder -Patient was taking clozapine and Haldol, but it appears he has run out of these medications at the group home -He had not taken clozapine since 7/13 -Psychiatry consulted and to start the patient back on clozapine, he would need to be enrolled in [facility] -This was accomplished by patient's outpatient psychiatric provider -He has been started on low-dose clozapine which will be adjusted up. -Titration schedule has been outlined by psychiatry -He is felt stable to discharge and follow-up with outpatient provider   Hypokalemia -resolved   AKI on CKD stage 3a -admitted with creatinine of 1.7 -improved to 1.2 with IV fluids -continue to monitor   Thrombocytopenia -chronic -possibly related to HIV -currently at baseline   GERD -continue on famotidine  DM2 -Resume on home regimen of metformin   Dispo -Seen by PT/OT with no further follow-up recommendations  Discharge Instructions  Discharge Instructions     Diet - low sodium heart healthy   Complete by: As directed    Increase activity slowly   Complete by: As directed       Allergies as of 10/31/2020   No Known Allergies      Medication List     TAKE these medications    acetaminophen 500 MG tablet Commonly known as: TYLENOL Take 1 tablet (500 mg total) by mouth every 8 (eight) hours  as needed for moderate pain.   aspirin EC 81 MG tablet Take 81 mg by mouth every morning. Swallow whole.   atorvastatin 20 MG  tablet Commonly known as: LIPITOR Take 20 mg by mouth every morning.   benztropine 2 MG tablet Commonly known as: COGENTIN Take 1 tablet (2 mg total) by mouth 2 (two) times daily.   Biktarvy 50-200-25 MG Tabs tablet Generic drug: bictegravir-emtricitabine-tenofovir AF Take 1 tablet by mouth daily.   cetirizine 10 MG tablet Commonly known as: ZYRTEC TAKE 1 TABLET ONCE DAILY. What changed: when to take this   clozapine 50 MG tablet Commonly known as: CLOZARIL 7/26 '75mg'$  QHS, 7/27 '25mg'$  AM and '100mg'$  QHS, 7/28 '75mg'$  AM and '100mg'$  QHS, 7/29 '125mg'$  AM and '100mg'$  QHS, 7/30 175 mg AM and '100mg'$  QHS, 7/31 '200mg'$  AM and '100mg'$  QHS What changed:  medication strength how much to take how to take this when to take this additional instructions   famotidine 20 MG tablet Commonly known as: PEPCID Take 20 mg by mouth 2 (two) times daily.   fluticasone 50 MCG/ACT nasal spray Commonly known as: FLONASE Place 2 sprays into both nostrils every morning.   folic acid 1 MG tablet Commonly known as: FOLVITE Take 1 mg by mouth every morning.   gabapentin 400 MG capsule Commonly known as: NEURONTIN Take 400 mg by mouth 2 (two) times daily. In the morning take with a 300 mg capsule for a total morning dose of 700 mg   gabapentin 300 MG capsule Commonly known as: NEURONTIN Take 300 mg by mouth every morning. Take with a 400 mg capsule for a total morning dose of 700 mg   haloperidol 5 MG tablet Commonly known as: HALDOL Take 2 tablets (10 mg total) by mouth daily with supper.   levETIRAcetam 750 MG tablet Commonly known as: KEPPRA Take 1 tablet (750 mg total) by mouth 2 (two) times daily.   metFORMIN 500 MG tablet Commonly known as: GLUCOPHAGE Take 250 mg by mouth 2 (two) times daily with a meal.   polyethylene glycol 17 g packet Commonly known as: MIRALAX / GLYCOLAX Take 17 g by mouth daily.   senna 8.6 MG tablet Commonly known as: SENOKOT Take 2 tablets by mouth every morning.    traZODone 50 MG tablet Commonly known as: DESYREL Take 1 tablet (50 mg total) by mouth at bedtime as needed for sleep.   valproic acid 250 MG capsule Commonly known as: DEPAKENE Take 2 capsules every morning then take 4 capsules at bedtime What changed:  how much to take how to take this when to take this additional instructions        No Known Allergies  Consultations: Neurology Psychiatry   Procedures/Studies: CT Head Wo Contrast  Result Date: 10/28/2020 CLINICAL DATA:  Possible OD. Unsure of what happened but pt was given Narcan in the field as well as in the ED. Pt unresponsive with pinpoint pupils at time of test EXAM: CT HEAD WITHOUT CONTRAST TECHNIQUE: Contiguous axial images were obtained from the base of the skull through the vertex without intravenous contrast. COMPARISON:  05/02/2018 FINDINGS: Brain: No evidence of acute infarction, hemorrhage, hydrocephalus, extra-axial collection or mass lesion/mass effect. Vascular: No hyperdense vessel or unexpected calcification. Skull: Normal. Negative for fracture or focal lesion. Sinuses/Orbits: Globes and orbits are unremarkable. Opacified left frontal sinus, chronic and stable from the prior CT. Remaining sinuses are clear. Other: None. IMPRESSION: 1. No acute intracranial abnormalities. Electronically Signed   By: Lajean Manes  M.D.   On: 10/28/2020 15:11   MR ANGIO HEAD WO CONTRAST  Result Date: 10/28/2020 CLINICAL DATA:  Transient ischemic attack EXAM: MRI HEAD WITHOUT AND WITH CONTRAST MRA HEAD WITHOUT CONTRAST MRA NECK WITHOUT CONTRAST TECHNIQUE: Multiplanar, multiecho pulse sequences of the brain and surrounding structures were obtained without and with intravenous contrast. Angiographic images of the Circle of Willis were obtained using MRA technique without intravenous contrast. Angiographic images of the neck were obtained using MRA technique without intravenous contrast. Carotid stenosis measurements (when applicable)  are obtained utilizing NASCET criteria, using the distal internal carotid diameter as the denominator. CONTRAST:  8.67m GADAVIST GADOBUTROL 1 MMOL/ML IV SOLN COMPARISON:  None. FINDINGS: MRI HEAD FINDINGS Brain: No acute infarct, mass effect or extra-axial collection. No acute or chronic hemorrhage. Normal white matter signal, parenchymal volume and CSF spaces. The midline structures are normal. There is no abnormal contrast enhancement. Vascular: Major flow voids are preserved. Skull and upper cervical spine: Normal calvarium and skull base. Visualized upper cervical spine and soft tissues are normal. Sinuses/Orbits:Opacification of the left frontal sinus. Normal orbits. MRA HEAD FINDINGS POSTERIOR CIRCULATION: --Vertebral arteries: Normal --Inferior cerebellar arteries: Normal. --Basilar artery: Normal. --Superior cerebellar arteries: Normal. --Posterior cerebral arteries: Normal. ANTERIOR CIRCULATION: --Intracranial internal carotid arteries: Normal. --Anterior cerebral arteries (ACA): Normal. --Middle cerebral arteries (MCA): Normal. ANATOMIC VARIANTS: Bilateral posterior communicating artery patency. MRA NECK FINDINGS Right dominant vertebral arteries. Diminutive left vertebral artery. Carotid systems are normal. IMPRESSION: 1. Normal MRI of the brain. 2. Normal MRA of the head and neck. Electronically Signed   By: KUlyses JarredM.D.   On: 10/28/2020 22:02   MR ANGIO NECK WO CONTRAST  Result Date: 10/28/2020 CLINICAL DATA:  Transient ischemic attack EXAM: MRI HEAD WITHOUT AND WITH CONTRAST MRA HEAD WITHOUT CONTRAST MRA NECK WITHOUT CONTRAST TECHNIQUE: Multiplanar, multiecho pulse sequences of the brain and surrounding structures were obtained without and with intravenous contrast. Angiographic images of the Circle of Willis were obtained using MRA technique without intravenous contrast. Angiographic images of the neck were obtained using MRA technique without intravenous contrast. Carotid stenosis  measurements (when applicable) are obtained utilizing NASCET criteria, using the distal internal carotid diameter as the denominator. CONTRAST:  8.536mGADAVIST GADOBUTROL 1 MMOL/ML IV SOLN COMPARISON:  None. FINDINGS: MRI HEAD FINDINGS Brain: No acute infarct, mass effect or extra-axial collection. No acute or chronic hemorrhage. Normal white matter signal, parenchymal volume and CSF spaces. The midline structures are normal. There is no abnormal contrast enhancement. Vascular: Major flow voids are preserved. Skull and upper cervical spine: Normal calvarium and skull base. Visualized upper cervical spine and soft tissues are normal. Sinuses/Orbits:Opacification of the left frontal sinus. Normal orbits. MRA HEAD FINDINGS POSTERIOR CIRCULATION: --Vertebral arteries: Normal --Inferior cerebellar arteries: Normal. --Basilar artery: Normal. --Superior cerebellar arteries: Normal. --Posterior cerebral arteries: Normal. ANTERIOR CIRCULATION: --Intracranial internal carotid arteries: Normal. --Anterior cerebral arteries (ACA): Normal. --Middle cerebral arteries (MCA): Normal. ANATOMIC VARIANTS: Bilateral posterior communicating artery patency. MRA NECK FINDINGS Right dominant vertebral arteries. Diminutive left vertebral artery. Carotid systems are normal. IMPRESSION: 1. Normal MRI of the brain. 2. Normal MRA of the head and neck. Electronically Signed   By: KeUlyses Jarred.D.   On: 10/28/2020 22:02   MR BRAIN W WO CONTRAST  Result Date: 10/28/2020 CLINICAL DATA:  Transient ischemic attack EXAM: MRI HEAD WITHOUT AND WITH CONTRAST MRA HEAD WITHOUT CONTRAST MRA NECK WITHOUT CONTRAST TECHNIQUE: Multiplanar, multiecho pulse sequences of the brain and surrounding structures were obtained without and with intravenous contrast.  Angiographic images of the Circle of Willis were obtained using MRA technique without intravenous contrast. Angiographic images of the neck were obtained using MRA technique without intravenous  contrast. Carotid stenosis measurements (when applicable) are obtained utilizing NASCET criteria, using the distal internal carotid diameter as the denominator. CONTRAST:  8.10m GADAVIST GADOBUTROL 1 MMOL/ML IV SOLN COMPARISON:  None. FINDINGS: MRI HEAD FINDINGS Brain: No acute infarct, mass effect or extra-axial collection. No acute or chronic hemorrhage. Normal white matter signal, parenchymal volume and CSF spaces. The midline structures are normal. There is no abnormal contrast enhancement. Vascular: Major flow voids are preserved. Skull and upper cervical spine: Normal calvarium and skull base. Visualized upper cervical spine and soft tissues are normal. Sinuses/Orbits:Opacification of the left frontal sinus. Normal orbits. MRA HEAD FINDINGS POSTERIOR CIRCULATION: --Vertebral arteries: Normal --Inferior cerebellar arteries: Normal. --Basilar artery: Normal. --Superior cerebellar arteries: Normal. --Posterior cerebral arteries: Normal. ANTERIOR CIRCULATION: --Intracranial internal carotid arteries: Normal. --Anterior cerebral arteries (ACA): Normal. --Middle cerebral arteries (MCA): Normal. ANATOMIC VARIANTS: Bilateral posterior communicating artery patency. MRA NECK FINDINGS Right dominant vertebral arteries. Diminutive left vertebral artery. Carotid systems are normal. IMPRESSION: 1. Normal MRI of the brain. 2. Normal MRA of the head and neck. Electronically Signed   By: KUlyses JarredM.D.   On: 10/28/2020 22:02   DG Chest Portable 1 View  Result Date: 10/28/2020 CLINICAL DATA:  Altered mental status. EXAM: PORTABLE CHEST 1 VIEW COMPARISON:  April 17, 2018. FINDINGS: The heart size and mediastinal contours are within normal limits. Minimal bibasilar subsegmental atelectasis is noted. The visualized skeletal structures are unremarkable. IMPRESSION: Minimal bibasilar subsegmental atelectasis. Electronically Signed   By: JMarijo ConceptionM.D.   On: 10/28/2020 14:36   DG Abd Portable 1V  Result Date:  10/28/2020 CLINICAL DATA:  Altered mental status, abdominal pain EXAM: PORTABLE ABDOMEN - 1 VIEW COMPARISON:  Radiograph 02/13/2018 FINDINGS: Diffusely air-filled appearance of both large and small bowel with a fairly sizable colonic stool burden noted as well. Loops of small bowel are at the upper limits of normal for caliber measuring up to 2.9 cm in diameter. Stable calcified phleboliths throughout the pelvis. Atelectatic changes noted in the lung bases. Mediastinal contours are unremarkable. Telemetry leads overlie the chest and abdomen. Degenerative changes seen in the spine, hips and pelvis. IMPRESSION: Diffusely air-filled appearance of both large and small bowel with a fairly significant colonic stool burden. Could reflect some slowed intestinal transit versus a mild ileus. Obstruction is less favored. Electronically Signed   By: PLovena LeM.D.   On: 10/28/2020 19:42   Overnight EEG with video  Result Date: 10/29/2020 YLora Havens MD     10/29/2020  1:27 PM Patient Name: JKALIM PICONEMRN: [id number] Attending: PLora HavensReferring Physician/Provider: Dr CSu MonksDuration: 10/28/2020 1814 to 10/29/2020 1236 Patient history: 89yoM with ams. EEG to evaluate for seizure Level of alertness: Awake, asleep AEDs during EEG study: LEV, VPA Technical aspects: This EEG study was done with scalp electrodes positioned according to the 10-20 International system of electrode placement. Electrical activity was acquired at a sampling rate of '500Hz'$  and reviewed with a high frequency filter of '70Hz'$  and a low frequency filter of '1Hz'$ . EEG data were recorded continuously and digitally stored. Description: The posterior dominant rhythm consists of 8 Hz activity of moderate voltage (25-35 uV) seen predominantly in posterior head regions, symmetric and reactive to eye opening and eye closing. Sleep was characterized by vertex waves, sleep spindles (12 to  14 Hz), maximal frontocentral region. EEG  showed intermittent 3-'5hz'$  theta-delta slowing. Hyperventilation and photic stimulation were not performed.   ABNORMALITY - Intermittent slow, generalized IMPRESSION: This study is suggestive of mild diffuse encephalopathy, nonspecific etiology. No seizures or epileptiform discharges were seen throughout the recording. Priyanka Barbra Sarks      Subjective: He is awake and alert.  Denies any specific complaints at this time.  Discharge Exam: Vitals:   10/30/20 0456 10/30/20 1100 10/30/20 1930 10/31/20 0503  BP: 123/63 111/70 (!) 144/70 (!) 138/58  Pulse: 68 69 74 75  Resp: '18 16 19 18  '$ Temp: 98.2 F (36.8 C) 98.4 F (36.9 C) 98.4 F (36.9 C) 98.5 F (36.9 C)  TempSrc:  Oral  Oral  SpO2: 99% 100% 100% 100%  Weight:      Height:        General: Pt is alert, awake, not in acute distress Cardiovascular: RRR, S1/S2 +, no rubs, no gallops Respiratory: CTA bilaterally, no wheezing, no rhonchi Abdominal: Soft, NT, ND, bowel sounds + Extremities: no edema, no cyanosis    The results of significant diagnostics from this hospitalization (including imaging, microbiology, ancillary and laboratory) are listed below for reference.     Microbiology: Recent Results (from the past 240 hour(s))  Blood culture (routine x 2)     Status: Abnormal   Collection Time: 10/28/20  5:10 PM   Specimen: BLOOD LEFT FOREARM  Result Value Ref Range Status   Specimen Description BLOOD LEFT FOREARM  Final   Special Requests   Final    BOTTLES DRAWN AEROBIC AND ANAEROBIC Blood Culture adequate volume   Culture  Setup Time   Final    GRAM POSITIVE COCCI AEROBIC BOTTLE ONLY CRITICAL RESULT CALLED TO, READ BACK BY AND VERIFIED WITH: PHARM D E.MARTIN ON [id number] AT [id number] BY E.PARRISH    Culture (A)  Final    STAPHYLOCOCCUS COHNII THE SIGNIFICANCE OF ISOLATING THIS ORGANISM FROM A SINGLE SET OF BLOOD CULTURES WHEN MULTIPLE SETS ARE DRAWN IS UNCERTAIN. PLEASE NOTIFY THE MICROBIOLOGY DEPARTMENT WITHIN ONE WEEK IF  SPECIATION AND SENSITIVITIES ARE REQUIRED. Performed at [facility], [place] [address], [place], [place] [address]    Report Status 10/31/2020 FINAL  Final  Blood Culture ID Panel (Reflexed)     Status: Abnormal   Collection Time: 10/28/20  5:10 PM  Result Value Ref Range Status   Enterococcus faecalis NOT DETECTED NOT DETECTED Final   Enterococcus Faecium NOT DETECTED NOT DETECTED Final   Listeria monocytogenes NOT DETECTED NOT DETECTED Final   Staphylococcus species DETECTED (A) NOT DETECTED Final    Comment: CRITICAL RESULT CALLED TO, READ BACK BY AND VERIFIED WITH: Vena Austria E.MARTIN ON [id number] AT [id number] BY E.PARRISH    Staphylococcus aureus (BCID) NOT DETECTED NOT DETECTED Final   Staphylococcus epidermidis NOT DETECTED NOT DETECTED Final   Staphylococcus lugdunensis NOT DETECTED NOT DETECTED Final   Streptococcus species NOT DETECTED NOT DETECTED Final   Streptococcus agalactiae NOT DETECTED NOT DETECTED Final   Streptococcus pneumoniae NOT DETECTED NOT DETECTED Final   Streptococcus pyogenes NOT DETECTED NOT DETECTED Final   A.calcoaceticus-baumannii NOT DETECTED NOT DETECTED Final   Bacteroides fragilis NOT DETECTED NOT DETECTED Final   Enterobacterales NOT DETECTED NOT DETECTED Final   Enterobacter cloacae complex NOT DETECTED NOT DETECTED Final   Escherichia coli NOT DETECTED NOT DETECTED Final   Klebsiella aerogenes NOT DETECTED NOT DETECTED Final   Klebsiella oxytoca NOT DETECTED NOT DETECTED Final   Klebsiella pneumoniae  NOT DETECTED NOT DETECTED Final   Proteus species NOT DETECTED NOT DETECTED Final   Salmonella species NOT DETECTED NOT DETECTED Final   Serratia marcescens NOT DETECTED NOT DETECTED Final   Haemophilus influenzae NOT DETECTED NOT DETECTED Final   Neisseria meningitidis NOT DETECTED NOT DETECTED Final   Pseudomonas aeruginosa NOT DETECTED NOT DETECTED Final   Stenotrophomonas maltophilia NOT DETECTED NOT DETECTED Final   Candida albicans NOT DETECTED  NOT DETECTED Final   Candida auris NOT DETECTED NOT DETECTED Final   Candida glabrata NOT DETECTED NOT DETECTED Final   Candida krusei NOT DETECTED NOT DETECTED Final   Candida parapsilosis NOT DETECTED NOT DETECTED Final   Candida tropicalis NOT DETECTED NOT DETECTED Final   Cryptococcus neoformans/gattii NOT DETECTED NOT DETECTED Final    Comment: Performed at [facility], [place] [address] [place], [place] [address]  Blood culture (routine x 2)     Status: None (Preliminary result)   Collection Time: 10/28/20  5:15 PM   Specimen: BLOOD  Result Value Ref Range Status   Specimen Description BLOOD LEFT ANTECUBITAL  Final   Special Requests   Final    BOTTLES DRAWN AEROBIC AND ANAEROBIC Blood Culture adequate volume   Culture   Final    NO GROWTH 3 DAYS Performed at [facility], [place] [address], [place], [place] [address]    Report Status PENDING  Incomplete  Resp Panel by RT-PCR (Flu A&B, Covid) Nasopharyngeal Swab     Status: None   Collection Time: 10/29/20 12:15 AM   Specimen: Nasopharyngeal Swab; Nasopharyngeal(NP) swabs in vial transport medium  Result Value Ref Range Status   SARS Coronavirus 2 by RT PCR NEGATIVE NEGATIVE Final    Comment: (NOTE) SARS-CoV-2 target nucleic acids are NOT DETECTED.  The SARS-CoV-2 RNA is generally detectable in upper respiratory specimens during the acute phase of infection. The lowest concentration of SARS-CoV-2 viral copies this assay can detect is 138 copies/mL. A negative result does not preclude SARS-Cov-2 infection and should not be used as the sole basis for treatment or other patient management decisions. A negative result may occur with  improper specimen collection/handling, submission of specimen other than nasopharyngeal swab, presence of viral mutation(s) within the areas targeted by this assay, and inadequate number of viral copies(<138 copies/mL). A negative result must be combined with clinical observations, patient  history, and epidemiological information. The expected result is Negative.  Fact Sheet for Patients:  EntrepreneurPulse.com.au  Fact Sheet for Healthcare Providers:  IncredibleEmployment.be  This test is no t yet approved or cleared by the [place] FDA and  has been authorized for detection and/or diagnosis of SARS-CoV-2 by FDA under an Emergency Use Authorization (EUA). This EUA will remain  in effect (meaning this test can be used) for the duration of the COVID-19 declaration under Section 564(b)(1) of the Act, 21 U.S.C.section 360bbb-3(b)(1), unless the authorization is terminated  or revoked sooner.       Influenza A by PCR NEGATIVE NEGATIVE Final   Influenza B by PCR NEGATIVE NEGATIVE Final    Comment: (NOTE) The Xpert Xpress SARS-CoV-2/FLU/RSV plus assay is intended as an aid in the diagnosis of influenza from Nasopharyngeal swab specimens and should not be used as a sole basis for treatment. Nasal washings and aspirates are unacceptable for Xpert Xpress SARS-CoV-2/FLU/RSV testing.  Fact Sheet for Patients: EntrepreneurPulse.com.au  Fact Sheet for Healthcare Providers: IncredibleEmployment.be  This test is not yet approved or cleared by the [place] and  has been authorized for detection and/or diagnosis of SARS-CoV-2 by FDA under an Emergency Use Authorization (EUA). This EUA will remain in effect (meaning this test can be used) for the duration of the COVID-19 declaration under Section 564(b)(1) of the Act, 21 U.S.C. section 360bbb-3(b)(1), unless the authorization is terminated or revoked.  Performed at [facility], [place] [address] [place], [place] [address]   Urine Culture     Status: None   Collection Time: 10/29/20  5:00 PM   Specimen: Urine, Clean Catch  Result Value Ref Range Status   Specimen Description URINE, CLEAN CATCH  Final   Special Requests  Immunocompromised  Final   Culture   Final    NO GROWTH Performed at [facility], [place] [address] [place],  [address]    Report Status 10/30/2020 FINAL  Final     Labs: BNP (last 3 results) No results for input(s): BNP in the last 8760 hours. Basic Metabolic Panel: Recent Labs  Lab 10/28/20 1418 10/29/20 0222 10/30/20 0354  NA 138 144 139  K 3.2* 4.0 4.5  CL 106 115* 108  CO2 '24 25 26  '$ GLUCOSE 145* 98 130*  BUN '19 13 11  '$ CREATININE 1.70* 1.39* 1.27*  CALCIUM 8.8* 8.0* 9.1   Liver Function Tests: Recent Labs  Lab 10/28/20 1418 10/29/20 0222 10/30/20 0354  AST 44* 31 25  ALT '29 21 24  '$ ALKPHOS 45 40 53  BILITOT 0.4 0.6 0.2*  PROT 6.6 5.6* 6.5  ALBUMIN 3.3* 2.6* 2.9*   No results for input(s): LIPASE, AMYLASE in the last 168 hours. Recent Labs  Lab 10/28/20 1418  AMMONIA 33   CBC: Recent Labs  Lab 10/28/20 1418 10/29/20 0222 10/30/20 0354 10/30/20 1359  WBC 6.7 6.8 8.1 6.5  NEUTROABS 4.1  --   --  4.5  HGB 10.8* 10.4* 11.3* 9.8*  HCT 33.5* 33.2* 35.0* 31.3*  MCV 91.8 94.1 91.6 94.0  PLT 117* 118* 137* 123*   Cardiac Enzymes: No results for input(s): CKTOTAL, CKMB, CKMBINDEX, TROPONINI in the last 168 hours. BNP: Invalid input(s): POCBNP CBG: Recent Labs  Lab 10/28/20 1358  GLUCAP 143*   D-Dimer No results for input(s): DDIMER in the last 72 hours. Hgb A1c No results for input(s): HGBA1C in the last 72 hours. Lipid Profile No results for input(s): CHOL, HDL, LDLCALC, TRIG, CHOLHDL, LDLDIRECT in the last 72 hours. Thyroid function studies No results for input(s): TSH, T4TOTAL, T3FREE, THYROIDAB in the last 72 hours.  Invalid input(s): FREET3 Anemia work up No results for input(s): VITAMINB12, FOLATE, FERRITIN, TIBC, IRON, RETICCTPCT in the last 72 hours. Urinalysis    Component Value Date/Time   COLORURINE YELLOW 10/29/2020 0137   APPEARANCEUR CLOUDY (A) 10/29/2020 0137   LABSPEC 1.008 10/29/2020 0137   PHURINE 6.0  10/29/2020 0137   GLUCOSEU NEGATIVE 10/29/2020 0137   HGBUR SMALL (A) 10/29/2020 0137   BILIRUBINUR NEGATIVE 10/29/2020 0137   KETONESUR NEGATIVE 10/29/2020 0137   PROTEINUR NEGATIVE 10/29/2020 0137   UROBILINOGEN 0.2 06/20/2014 1525   NITRITE NEGATIVE 10/29/2020 0137   LEUKOCYTESUR LARGE (A) 10/29/2020 0137   Sepsis Labs Invalid input(s): PROCALCITONIN,  WBC,  LACTICIDVEN Microbiology Recent Results (from the past 240 hour(s))  Blood culture (routine x 2)     Status: Abnormal   Collection Time: 10/28/20  5:10 PM   Specimen: BLOOD LEFT FOREARM  Result Value Ref Range Status   Specimen Description BLOOD LEFT FOREARM  Final   Special Requests  Final    BOTTLES DRAWN AEROBIC AND ANAEROBIC Blood Culture adequate volume   Culture  Setup Time   Final    GRAM POSITIVE COCCI AEROBIC BOTTLE ONLY CRITICAL RESULT CALLED TO, READ BACK BY AND VERIFIED WITH: PHARM D E.MARTIN ON [id number] AT [id number] BY E.PARRISH    Culture (A)  Final    STAPHYLOCOCCUS COHNII THE SIGNIFICANCE OF ISOLATING THIS ORGANISM FROM A SINGLE SET OF BLOOD CULTURES WHEN MULTIPLE SETS ARE DRAWN IS UNCERTAIN. PLEASE NOTIFY THE MICROBIOLOGY DEPARTMENT WITHIN ONE WEEK IF SPECIATION AND SENSITIVITIES ARE REQUIRED. Performed at [facility], [place] [address], [place], [place] [address]    Report Status 10/31/2020 FINAL  Final  Blood Culture ID Panel (Reflexed)     Status: Abnormal   Collection Time: 10/28/20  5:10 PM  Result Value Ref Range Status   Enterococcus faecalis NOT DETECTED NOT DETECTED Final   Enterococcus Faecium NOT DETECTED NOT DETECTED Final   Listeria monocytogenes NOT DETECTED NOT DETECTED Final   Staphylococcus species DETECTED (A) NOT DETECTED Final    Comment: CRITICAL RESULT CALLED TO, READ BACK BY AND VERIFIED WITH: Vena Austria E.MARTIN ON [id number] AT [id number] BY E.PARRISH    Staphylococcus aureus (BCID) NOT DETECTED NOT DETECTED Final   Staphylococcus epidermidis NOT DETECTED NOT DETECTED Final    Staphylococcus lugdunensis NOT DETECTED NOT DETECTED Final   Streptococcus species NOT DETECTED NOT DETECTED Final   Streptococcus agalactiae NOT DETECTED NOT DETECTED Final   Streptococcus pneumoniae NOT DETECTED NOT DETECTED Final   Streptococcus pyogenes NOT DETECTED NOT DETECTED Final   A.calcoaceticus-baumannii NOT DETECTED NOT DETECTED Final   Bacteroides fragilis NOT DETECTED NOT DETECTED Final   Enterobacterales NOT DETECTED NOT DETECTED Final   Enterobacter cloacae complex NOT DETECTED NOT DETECTED Final   Escherichia coli NOT DETECTED NOT DETECTED Final   Klebsiella aerogenes NOT DETECTED NOT DETECTED Final   Klebsiella oxytoca NOT DETECTED NOT DETECTED Final   Klebsiella pneumoniae NOT DETECTED NOT DETECTED Final   Proteus species NOT DETECTED NOT DETECTED Final   Salmonella species NOT DETECTED NOT DETECTED Final   Serratia marcescens NOT DETECTED NOT DETECTED Final   Haemophilus influenzae NOT DETECTED NOT DETECTED Final   Neisseria meningitidis NOT DETECTED NOT DETECTED Final   Pseudomonas aeruginosa NOT DETECTED NOT DETECTED Final   Stenotrophomonas maltophilia NOT DETECTED NOT DETECTED Final   Candida albicans NOT DETECTED NOT DETECTED Final   Candida auris NOT DETECTED NOT DETECTED Final   Candida glabrata NOT DETECTED NOT DETECTED Final   Candida krusei NOT DETECTED NOT DETECTED Final   Candida parapsilosis NOT DETECTED NOT DETECTED Final   Candida tropicalis NOT DETECTED NOT DETECTED Final   Cryptococcus neoformans/gattii NOT DETECTED NOT DETECTED Final    Comment: Performed at [facility], [address], [place], [place] [address]  Blood culture (routine x 2)     Status: None (Preliminary result)   Collection Time: 10/28/20  5:15 PM   Specimen: BLOOD  Result Value Ref Range Status   Specimen Description BLOOD LEFT ANTECUBITAL  Final   Special Requests   Final    BOTTLES DRAWN AEROBIC AND ANAEROBIC Blood Culture adequate volume   Culture   Final    NO  GROWTH 3 DAYS Performed at [facility], [place] [address] [place], [place] [address]    Report Status PENDING  Incomplete  Resp Panel by RT-PCR (Flu A&B, Covid) Nasopharyngeal Swab     Status: None   Collection Time: 10/29/20 12:15  AM   Specimen: Nasopharyngeal Swab; Nasopharyngeal(NP) swabs in vial transport medium  Result Value Ref Range Status   SARS Coronavirus 2 by RT PCR NEGATIVE NEGATIVE Final    Comment: (NOTE) SARS-CoV-2 target nucleic acids are NOT DETECTED.  The SARS-CoV-2 RNA is generally detectable in upper respiratory specimens during the acute phase of infection. The lowest concentration of SARS-CoV-2 viral copies this assay can detect is 138 copies/mL. A negative result does not preclude SARS-Cov-2 infection and should not be used as the sole basis for treatment or other patient management decisions. A negative result may occur with  improper specimen collection/handling, submission of specimen other than nasopharyngeal swab, presence of viral mutation(s) within the areas targeted by this assay, and inadequate number of viral copies(<138 copies/mL). A negative result must be combined with clinical observations, patient history, and epidemiological information. The expected result is Negative.  Fact Sheet for Patients:  EntrepreneurPulse.com.au  Fact Sheet for Healthcare Providers:  IncredibleEmployment.be  This test is no t yet approved or cleared by the [place] FDA and  has been authorized for detection and/or diagnosis of SARS-CoV-2 by FDA under an Emergency Use Authorization (EUA). This EUA will remain  in effect (meaning this test can be used) for the duration of the COVID-19 declaration under Section 564(b)(1) of the Act, 21 U.S.C.section 360bbb-3(b)(1), unless the authorization is terminated  or revoked sooner.       Influenza A by PCR NEGATIVE NEGATIVE Final   Influenza B by PCR NEGATIVE NEGATIVE Final     Comment: (NOTE) The Xpert Xpress SARS-CoV-2/FLU/RSV plus assay is intended as an aid in the diagnosis of influenza from Nasopharyngeal swab specimens and should not be used as a sole basis for treatment. Nasal washings and aspirates are unacceptable for Xpert Xpress SARS-CoV-2/FLU/RSV testing.  Fact Sheet for Patients: EntrepreneurPulse.com.au  Fact Sheet for Healthcare Providers: IncredibleEmployment.be  This test is not yet approved or cleared by the [place] FDA and has been authorized for detection and/or diagnosis of SARS-CoV-2 by FDA under an Emergency Use Authorization (EUA). This EUA will remain in effect (meaning this test can be used) for the duration of the COVID-19 declaration under Section 564(b)(1) of the Act, 21 U.S.C. section 360bbb-3(b)(1), unless the authorization is terminated or revoked.  Performed at [facility], [place] [address] [place], [place] [address]   Urine Culture     Status: None   Collection Time: 10/29/20  5:00 PM   Specimen: Urine, Clean Catch  Result Value Ref Range Status   Specimen Description URINE, CLEAN CATCH  Final   Special Requests Immunocompromised  Final   Culture   Final    NO GROWTH Performed at [facility], [place] [address], [place], [place] [address]    Report Status 10/30/2020 FINAL  Final     Time coordinating discharge: 23mns  SIGNED:   JKathie Dike MD  [facility] 10/31/2020, 2:35 PM   If 7PM-7AM, please contact night-coverage [email or web address]

## 2020-11-01 LAB — CMV DNA BY PCR, QUALITATIVE: CMV DNA, Qual PCR: NEGATIVE

## 2020-11-02 LAB — CULTURE, BLOOD (ROUTINE X 2)
Culture: NO GROWTH
Special Requests: ADEQUATE

## 2020-11-02 LAB — EPSTEIN BARR VRS(EBV DNA BY PCR): EBV DNA QN by PCR: NEGATIVE IU/mL

## 2020-11-28 ENCOUNTER — Telehealth (HOSPITAL_COMMUNITY): Payer: Self-pay | Admitting: *Deleted

## 2020-11-28 ENCOUNTER — Telehealth (HOSPITAL_COMMUNITY): Payer: Self-pay | Admitting: Physician Assistant

## 2020-11-28 ENCOUNTER — Other Ambulatory Visit (HOSPITAL_COMMUNITY): Payer: Self-pay | Admitting: Psychiatry

## 2020-11-28 DIAGNOSIS — F209 Schizophrenia, unspecified: Secondary | ICD-10-CM

## 2020-11-28 NOTE — Telephone Encounter (Signed)
Provider did not fill an order for Clozaril.  A lab for CBC, BMP, and liver function was sent to [facility].  Patient will need to have these labs drawn prior to Clozaril being administered by Probation officer.

## 2020-11-28 NOTE — Telephone Encounter (Signed)
Call from [place] at patients group home requesting lab order for patient so he can get his Clozaril Rx. Reviewed his record. He got Clozaril Rx from ED but apparently no lab orders were provided by the ED Dr that saw him on 7/23 and gave him the Clozaril Rx. He needs his lab drawn by thurs, he is monthly per Darlina Guys. He doesn't have an appt with his provider here till Sept and his provider is out of this office till next Tues. Will consult with provider that is here tomorrow to see if she will be willing to write for his labs. The group home patient lives at has there own arrangements for lab to be done. Will call Darlina Guys tomorrow after speaking with Dr Ronne Binning.

## 2020-11-28 NOTE — Telephone Encounter (Signed)
Patient Rep called for REFILL: CLOZARIL 50 MG written by Kathie Dike, MD on 10/31/20 Pharmacy :  [facility] Patient care giver: Arlyss Repress, care giver, [phone] Last seen:  10/25/20 est. care U. NWOK[facility] PAC COMMENTS: 9 days supply remaining today Next Appt: 12/21/20

## 2020-11-28 NOTE — Telephone Encounter (Signed)
Provider did not fill an order for Clozaril.  A lab for CBC, BMP, and liver function was sent to [facility].  Patient will need to have these labs drawn prior to Clozaril being administered by Probation officer.

## 2020-11-29 ENCOUNTER — Telehealth (HOSPITAL_COMMUNITY): Payer: Self-pay | Admitting: *Deleted

## 2020-11-29 NOTE — Telephone Encounter (Signed)
Provider acknowledged and reviewed message.  Thank you for the follow-up.

## 2020-11-29 NOTE — Telephone Encounter (Signed)
Faxed lab request to [facility]. Did not hear from them so hours later called to follow up to see if they received lab request. Spoke with Maylon Cos and he said since we dont have an account with them we cant order labs, but the account for this patient is under [facility] and he provided me with the number and said someone there can order labs. Called the number he provided no answer and message said my call cant be made at this time. I called Darlina Guys back to see if she could follow up with Elly Modena and get the order put in, she said she has never heard of Elly Modena and the group home he is at is owned by [employer]. I asked her to follow up with her boss to find out who going forward can order patients lab work but for today to take him to [facility] as that is who we use and where his lab orders have been submitted.She said she would have him to the lab today and follow up with future orders and get back to me.

## 2020-11-29 NOTE — Telephone Encounter (Signed)
Called Nathan [name] this am re lab for patient, the provider put it in for [facility] as that is who we use, but the group home uses [facility], got their fax to fax the orders to as he needs his lab drawn by tomorrow to get his rx for his Clozaril.

## 2020-11-30 ENCOUNTER — Telehealth (HOSPITAL_COMMUNITY): Payer: Self-pay | Admitting: *Deleted

## 2020-11-30 NOTE — Telephone Encounter (Signed)
Following up today with requested lab results. No results since July at [facility]. I tried to call the group home, one number not in service, one sounded like I was calling a fax machine and the last number just rang and did not go to voice mail. Will wait now till I hear from the group home.

## 2020-12-08 ENCOUNTER — Encounter (HOSPITAL_COMMUNITY): Payer: Self-pay | Admitting: Physician Assistant

## 2020-12-08 ENCOUNTER — Telehealth (HOSPITAL_COMMUNITY): Payer: Self-pay | Admitting: *Deleted

## 2020-12-08 NOTE — Telephone Encounter (Signed)
VM yesterday pm from group home stating Nathan [name] couldn't be picked up because the provider that wrote the rx Eddie PA is not on the registery. Will notify Eddie PA of this call to see what action needs to take place.

## 2020-12-13 ENCOUNTER — Encounter (HOSPITAL_COMMUNITY): Payer: Self-pay | Admitting: Physician Assistant

## 2020-12-14 ENCOUNTER — Telehealth (HOSPITAL_COMMUNITY): Payer: Self-pay | Admitting: *Deleted

## 2020-12-14 NOTE — Telephone Encounter (Signed)
Checked chart but there are no recent lab results indicating he had lab done, specifically CBC related to him taking Clozaril. Spoke with Kyle PA yesterday and today to see if he had followed up with home, said he would but had not done so yet.

## 2020-12-21 ENCOUNTER — Encounter (HOSPITAL_COMMUNITY): Payer: 59 | Admitting: Physician Assistant

## 2021-01-05 ENCOUNTER — Telehealth (HOSPITAL_COMMUNITY): Payer: Self-pay | Admitting: *Deleted

## 2021-01-05 NOTE — Telephone Encounter (Signed)
Saw Anitha here with another patient from the same group home and spoke with her re Ethyn. Informed her I had not received lab for him in the past two weeks. He is to provide weekly CBC with diff because he is taking Clozaril and this is the protocol. She said she would fax it to me. I received the other two group home patients CBC but not Jeffery. She told me if I did not receive labs in future to call her and I told her it was her responsibility to get them to me. She expressed her understanding. This process has been made a bit more difficult because the group home uses a lab other than [facility] and they draw at the group home so I am unable to access the results as we do not have a contract with the lab they use. She expressed to front desk staff when scheduling a return appt for a patient that staff should be nicer. I never received this patients lab for this week or last.

## 2021-01-22 ENCOUNTER — Ambulatory Visit: Payer: Medicare Other | Admitting: Podiatry

## 2021-01-26 ENCOUNTER — Telehealth (HOSPITAL_COMMUNITY): Payer: Self-pay | Admitting: *Deleted

## 2021-01-26 NOTE — Telephone Encounter (Signed)
Nathan [name] (from [facility]) called regarding pt that showing behavior since changing the Clozapine 50 mg -instruction, 2 tab am/ 1 tab pm to 1 tab am/2 tab pm.   Nathan [name] is on vacation ([phone]) but LVM to Nathan [name] to call the office back. Spoke Nathan [name] mom--but does not have information regarding whats going on with the patient.

## 2021-02-06 ENCOUNTER — Ambulatory Visit: Payer: Medicaid Other | Admitting: Podiatry

## 2021-02-16 ENCOUNTER — Telehealth (HOSPITAL_COMMUNITY): Payer: Self-pay | Admitting: *Deleted

## 2021-02-16 NOTE — Telephone Encounter (Signed)
I called Rodena Piety with [facility] though I told her when I last saw her I would not be calling re patients labs as it is her responsibility to manage her residents labs and medicines but I have not received any labs since Oct 20th and he is to have weekly labs for his Clozaril to be dispensed. She sent over his CBC dated 02/08/21 and I put it into the [facility] system. Reminded her again its to be weekly and to fax me the lab weekly. It is worth repeating I have had this conversation with her several times before and still no labs without me calling and I am unclear how he gets his Clozaril without labs in [facility].

## 2021-02-19 ENCOUNTER — Ambulatory Visit: Payer: Medicaid Other | Admitting: Podiatry

## 2021-02-28 ENCOUNTER — Ambulatory Visit: Payer: Medicare Other | Admitting: Podiatry

## 2021-03-07 ENCOUNTER — Telehealth (HOSPITAL_COMMUNITY): Payer: Self-pay | Admitting: *Deleted

## 2021-03-07 NOTE — Telephone Encounter (Signed)
Called and spoke with [facility] owner re needed lab work for patient to continue on Clozaril. He is to be weekly lab and last time I got results was on 02/08/21. She is to fax it to me today.

## 2021-03-10 NOTE — Progress Notes (Signed)
Chart reviewed, agree above plan ?

## 2021-03-14 ENCOUNTER — Encounter (HOSPITAL_COMMUNITY): Payer: 59 | Admitting: Physician Assistant

## 2021-03-14 ENCOUNTER — Encounter (HOSPITAL_COMMUNITY): Payer: Self-pay

## 2021-03-14 ENCOUNTER — Other Ambulatory Visit: Payer: Self-pay

## 2021-03-15 ENCOUNTER — Telehealth (HOSPITAL_COMMUNITY): Payer: Self-pay | Admitting: *Deleted

## 2021-03-15 ENCOUNTER — Encounter (HOSPITAL_COMMUNITY): Payer: Self-pay | Admitting: Physician Assistant

## 2021-03-15 ENCOUNTER — Telehealth (INDEPENDENT_AMBULATORY_CARE_PROVIDER_SITE_OTHER): Payer: 59 | Admitting: Physician Assistant

## 2021-03-15 DIAGNOSIS — F79 Unspecified intellectual disabilities: Secondary | ICD-10-CM

## 2021-03-15 DIAGNOSIS — G4700 Insomnia, unspecified: Secondary | ICD-10-CM

## 2021-03-15 DIAGNOSIS — F259 Schizoaffective disorder, unspecified: Secondary | ICD-10-CM | POA: Diagnosis not present

## 2021-03-15 DIAGNOSIS — R4689 Other symptoms and signs involving appearance and behavior: Secondary | ICD-10-CM

## 2021-03-15 NOTE — Telephone Encounter (Signed)
Called pharmacy as listed under his Clozaril rx as [facility], spoke with Nira Conn and she informed me they no longer provide services to [place].

## 2021-03-15 NOTE — Telephone Encounter (Signed)
Called [facility] to get his past CBC w Diff results that the group home has not been providing and added them into the [facility] system.

## 2021-03-15 NOTE — Progress Notes (Signed)
BH MD/PA/NP OP Progress Note  Virtual Visit via Video Note  I connected with Nathan [name] on 03/15/21 at 10:00 AM EST by a video enabled telemedicine application and verified that I am speaking with the correct person using two identifiers.  Location: Patient: Home Provider: Clinic   I discussed the limitations of evaluation and management by telemedicine and the availability of in person appointments. The patient expressed understanding and agreed to proceed.  Follow Up Instructions:  I discussed the assessment and treatment plan with the patient. The patient was provided an opportunity to ask questions and all were answered. The patient agreed with the plan and demonstrated an understanding of the instructions.   The patient was advised to call back or seek an in-person evaluation if the symptoms worsen or if the condition fails to improve as anticipated.  I provided 21 minutes of non-face-to-face time during this encounter.  Malachy Mood, PA   03/15/2021 10:25 AM [name]  MRN:  [id number]  Chief Complaint: Follow up and medication management  HPI:   Nathan [name] is a 60 year old male with a past psychiatric history significant for intellectual disability, schizoaffective disorder, aggressive behavior, and insomnia who presents to [name] via virtual video visit, accompanied by [name], for follow-up and medication management.  Patient is currently being managed on the following medications:  Clozaril 200 mg in the morning/200 mg at night Benztropine 2 mg 2 times daily Valproic acid 250 mg 2 tablets in the morning/2 tablets at night  Most of the patient's history was provided by the group home manager.  She reports that patient continues to take all his medications as prescribed.  Patient was asked to describe his mood to which he replied he was happy.  Patient does not appear to be depressed or  anxious during the encounter.  Patient unable to perform PHQ-9 or GAD-7 screen due to intellectual disability.  Patient is calm and cooperative on exam and does not appear to be in any acute distress.  Patient does not appear to be exhibiting any suicidal or homicidal ideations.  Patient does not appear appear to be exhibiting auditory or visual hallucinations and does not appear to be responding to internal/external stimuli.  Per group home manager, patient receives enough sleep.  She states that the patient eats on average 3 meals per day.  Patient does not consume alcohol, use tobacco products, or illicit drug use.  Visit Diagnosis:    ICD-10-CM   1. Intellectual disability  F79     2. Schizoaffective disorder, unspecified type ([place])  F25.9     3. Aggressive behavior  R46.89     4. Insomnia, unspecified type  G47.00       Past Psychiatric History:  Per consult note Nathan Hai, MD, 04/03/2019), HPI: Patient is a chronic schizoaffective versus schizophrenic patient with low IQ and developmental delay he is currently alert and oriented and cooperative he denies wanting to harm self or others  Intellectual disability Schizoaffective disorder, unspecified type Aggressive behavior Insomnia  Past Medical History:  Past Medical History:  Diagnosis Date   Anemia    Anemia    Anxiety    Bipolar 1 disorder ([place])    Depression, major    HIV (human immunodeficiency virus infection) ([place])    Hyperglycemia 08/06/2017   Hypernatremia    Kidney failure    Lithium toxicity    MR (mental retardation)    Schizoaffective disorder ([place])  08/06/2017   Schizophrenia ([place])    Seizures ([place])     Past Surgical History:  Procedure Laterality Date   BIOPSY N/A 04/12/2014   Procedure: BIOPSY;  Surgeon: Danie Binder, MD;  Location: [facility];  Service: Endoscopy;  Laterality: N/A;  Gastric   COLONOSCOPY WITH PROPOFOL N/A 04/12/2014   SLF:  1. one colon polyp removed 2. The left colon is extremely  redundant 3. Small internal hemorrhoids   ESOPHAGOGASTRODUODENOSCOPY (EGD) WITH PROPOFOL N/A 04/12/2014   SLF: Stricture at the gastroesophageal junction 2. small polyp  in teh gastric body 3. Mild Non-erosive gastritis.    NOSE SURGERY  1968   unsure what type of surgery. something to help him breathe better   POLYPECTOMY N/A 04/12/2014   Procedure: POLYPECTOMY;  Surgeon: Danie Binder, MD;  Location: [facility];  Service: Endoscopy;  Laterality: N/A;  Cecal   SAVORY DILATION N/A 04/12/2014   Procedure: SAVORY DILATION;  Surgeon: Danie Binder, MD;  Location: [facility];  Service: Endoscopy;  Laterality: N/A;  12.8/ 14/15/16   SKIN BIOPSY      Family Psychiatric History:  History of family psychiatric history unknown  Family History:  Family History  Problem Relation Age of Onset   Other Mother        unknown medical history   Other Father        unknown medical history   Colon cancer Neg Hx     Social History:  Social History   Socioeconomic History   Marital status: Single    Spouse name: Not on file   Number of children: 0   Years of education: 9th grade   Highest education level: Not on file  Occupational History   Occupation: Disabled  Tobacco Use   Smoking status: Never   Smokeless tobacco: Never  Vaping Use   Vaping Use: Never used  Substance and Sexual Activity   Alcohol use: No    Alcohol/week: 0.0 standard drinks   Drug use: No   Sexual activity: Never    Birth control/protection: Abstinence    Comment: [place]CL[place]ED [place]DOM[place]  Other Topics Concern   Not on file  Social History Narrative   Lives in group home.   Right-handed.   2-3 sodas per day.   Social Determinants of Health   Financial Resource Strain: Not on file  Food Insecurity: Not on file  Transportation Needs: Not on file  Physical Activity: Not on file  Stress: Not on file  Social Connections: Not on file    Allergies: No Known Allergies  Metabolic Disorder Labs: No results found for: HGBA1C,  MPG No results found for: PROLACTIN Lab Results  Component Value Date   CHOL 226 (H) 03/13/2016   TRIG 209 (H) 03/13/2016   HDL 36 (L) 03/13/2016   CHOLHDL 6.3 (H) 03/13/2016   VLDL 42 (H) 03/13/2016   LDLCALC 148 (H) 03/13/2016   LDLCALC 125 10/31/2014   Lab Results  Component Value Date   TSH 0.730 07/20/2016   TSH 0.534 05/07/2011    Therapeutic Level Labs: Lab Results  Component Value Date   LITHIUM <0.06 (L) 08/17/2014   LITHIUM 1.16 06/19/2014   Lab Results  Component Value Date   VALPROATE 53 10/28/2020   VALPROATE 76 10/04/2019   No components found for:  CBMZ  Current Medications: Current Outpatient Medications  Medication Sig Dispense Refill   acetaminophen (TYLENOL) 500 MG tablet Take 1 tablet (500 mg total) by mouth every 8 (eight) hours as  needed for moderate pain. 30 tablet 0   aspirin EC 81 MG tablet Take 81 mg by mouth every morning. Swallow whole.     atorvastatin (LIPITOR) 20 MG tablet Take 20 mg by mouth every morning.     benztropine (COGENTIN) 2 MG tablet Take 1 tablet (2 mg total) by mouth 2 (two) times daily. 60 tablet 2   bictegravir-emtricitabine-tenofovir AF (BIKTARVY) 50-200-25 MG TABS tablet Take 1 tablet by mouth daily. 30 tablet 11   cetirizine (ZYRTEC) 10 MG tablet TAKE 1 TABLET ONCE DAILY. 30 tablet 3   cloZAPine (CLOZARIL) 50 MG tablet 7/26 75mg  QHS, 7/27 25mg  AM and 100mg  QHS, 7/28 75mg  AM and 100mg  QHS, 7/29 125mg  AM and 100mg  QHS, 7/30 175 mg AM and 100mg  QHS, 7/31 200mg  AM and 100mg  QHS 180 tablet 0   famotidine (PEPCID) 20 MG tablet Take 20 mg by mouth 2 (two) times daily.     fluticasone (FLONASE) 50 MCG/ACT nasal spray Place 2 sprays into both nostrils every morning.     folic acid (FOLVITE) 1 MG tablet Take 1 mg by mouth every morning.     gabapentin (NEURONTIN) 300 MG capsule Take 300 mg by mouth every morning. Take with a 400 mg capsule for a total morning dose of 700 mg     gabapentin (NEURONTIN) 400 MG capsule Take 400 mg by  mouth 2 (two) times daily. In the morning take with a 300 mg capsule for a total morning dose of 700 mg     haloperidol (HALDOL) 5 MG tablet Take 2 tablets (10 mg total) by mouth daily with supper. 60 tablet 2   levETIRAcetam (KEPPRA) 750 MG tablet Take 1 tablet (750 mg total) by mouth 2 (two) times daily. 180 tablet 4   metFORMIN (GLUCOPHAGE) 500 MG tablet Take 250 mg by mouth 2 (two) times daily with a meal.     polyethylene glycol (MIRALAX / GLYCOLAX) packet Take 17 g by mouth daily. 30 each 0   senna (SENOKOT) 8.6 MG tablet Take 2 tablets by mouth every morning.     traZODone (DESYREL) 50 MG tablet Take 1 tablet (50 mg total) by mouth at bedtime as needed for sleep. 30 tablet 2   valproic acid (DEPAKENE) 250 MG capsule Take 2 capsules every morning then take 4 capsules at bedtime 180 capsule 2   No current facility-administered medications for this visit.     Musculoskeletal: Strength & Muscle Tone: Unable to assess due to telemedicine visit [place]: Unable to assess due to telemedicine visit Patient leans: Unable to assess due to telemedicine visit  Psychiatric Specialty Exam: Review of Systems  Psychiatric/Behavioral:  Negative for decreased concentration, dysphoric mood, hallucinations, self-injury, sleep disturbance and suicidal ideas. The patient is not nervous/anxious and is not hyperactive.    There were no vitals taken for this visit.There is no height or weight on file to calculate BMI.  General Appearance: Fairly Groomed and Neat  Eye Contact:  Minimal  Speech:  Clear and Coherent  Volume:  Normal  Mood:  Euthymic  Affect:  Appropriate  Thought Process:  Coherent  Orientation:  Other:  Patient has an intellectual disability  Thought Content: WDL   Suicidal Thoughts:  No  Homicidal Thoughts:  No  Memory:  Immediate;   Fair Recent;   Fair Remote;   Fair  Judgement:  Impaired  Insight:  Lacking  Psychomotor Activity:  Normal  Concentration:  Concentration:  Fair and Attention Span: Fair  Recall:  Poor  Fund of Knowledge: Poor  Language: Poor  Akathisia:  NA  Handed:  Unable to assess  AIMS (if indicated): not done  Assets:  Housing Microbiologist  ADL's:  Impaired  Cognition: Impaired,  Moderate  Sleep:  Fair   Screenings: PHQ2-9    [place] Office Visit from 01/15/2018 in [name] Office Visit from 08/28/2016 in [facility] for Infectious Disease Office Visit from 06/16/2013 in [facility] for [facility] Office Visit from 11/25/2012 in [facility] for Infectious Disease Office Visit from 06/19/2012 in [name] for [facility]ious Disease  PHQ-2 Total Score 0 0 0 0 0      Flowsheet Row Video Visit from 03/15/2021 in [facility] from 03/31/2019 in [facility] No Risk No Risk        Assessment and Plan:   Karl Erway. Printy is a 60 year old male with a past psychiatric history significant for intellectual disability, schizoaffective disorder, aggressive behavior, and insomnia who presents to [name] outpatient clinic via virtual video visit, accompanied by [place], for follow-up and medication management.  Per [facility], patient continues to take all medications prescribed to him.  She reports no major issues or concerns regarding the patient's mental health.  Patient does not appear to be depressed or exhibiting anxiety during the encounter.  Patient to continue taking medications as prescribed.  1. Intellectual disability   2. Schizoaffective disorder, unspecified type ([place]) Patient is currently taking clozapine 200 mg in the morning/200 mg at night Patient is currently taking benztropine 2 mg 2 times daily Patient is taking valproic acid 250 mg 2 tablets in the morning/2  tablets at night  3. Aggressive behavior Patient is taking valproic acid 250 mg 2 tablets in the morning/2 tablets at night  4. Insomnia, unspecified type   Patient to follow up in Provider spent a total of 21 minutes with the patient/reviewing patient's chart  Malachy Mood, PA 03/15/2021, 10:25 AM

## 2021-03-16 ENCOUNTER — Telehealth (HOSPITAL_COMMUNITY): Payer: Self-pay | Admitting: *Deleted

## 2021-03-16 NOTE — Telephone Encounter (Signed)
Called [facility] to get his CBC results for this week. [facility] is not pro active on this, they do not on their own initiative provide the labs for him which need to be done and put into [facility] weekly.

## 2021-03-23 ENCOUNTER — Telehealth (HOSPITAL_COMMUNITY): Payer: Self-pay | Admitting: *Deleted

## 2021-03-23 NOTE — Telephone Encounter (Signed)
Called [facility] to get patients most recent CBC results for his Clozaril use. The facility is very poor about providing the results so I call the lab. Its 335 pm and they were drawn yesterday. Waiting on [facility] to fax them to me to put into [facility].

## 2021-03-25 ENCOUNTER — Encounter (HOSPITAL_COMMUNITY): Payer: Self-pay

## 2021-03-25 ENCOUNTER — Emergency Department (HOSPITAL_COMMUNITY)
Admission: EM | Admit: 2021-03-25 | Discharge: 2021-03-25 | Disposition: A | Discharge status: Home / Self Care | Payer: 59 | Attending: Emergency Medicine | Admitting: Emergency Medicine

## 2021-03-25 ENCOUNTER — Emergency Department (HOSPITAL_COMMUNITY): Payer: 59

## 2021-03-25 ENCOUNTER — Other Ambulatory Visit: Payer: Self-pay

## 2021-03-25 DIAGNOSIS — Z7982 Long term (current) use of aspirin: Secondary | ICD-10-CM | POA: Insufficient documentation

## 2021-03-25 DIAGNOSIS — N183 Chronic kidney disease, stage 3 unspecified: Secondary | ICD-10-CM | POA: Insufficient documentation

## 2021-03-25 DIAGNOSIS — Z7984 Long term (current) use of oral hypoglycemic drugs: Secondary | ICD-10-CM | POA: Diagnosis not present

## 2021-03-25 DIAGNOSIS — Z21 Asymptomatic human immunodeficiency virus [HIV] infection status: Secondary | ICD-10-CM | POA: Diagnosis not present

## 2021-03-25 DIAGNOSIS — R059 Cough, unspecified: Secondary | ICD-10-CM | POA: Diagnosis present

## 2021-03-25 DIAGNOSIS — Z20822 Contact with and (suspected) exposure to covid-19: Secondary | ICD-10-CM | POA: Diagnosis not present

## 2021-03-25 DIAGNOSIS — J101 Influenza due to other identified influenza virus with other respiratory manifestations: Secondary | ICD-10-CM | POA: Diagnosis not present

## 2021-03-25 LAB — CBC WITH DIFFERENTIAL/PLATELET
Abs Immature Granulocytes: 0.01 10*3/uL (ref 0.00–0.07)
Basophils Absolute: 0 10*3/uL (ref 0.0–0.1)
Basophils Relative: 0 %
Eosinophils Absolute: 0.1 10*3/uL (ref 0.0–0.5)
Eosinophils Relative: 1 %
HCT: 35.1 % — ABNORMAL LOW (ref 39.0–52.0)
Hemoglobin: 11.2 g/dL — ABNORMAL LOW (ref 13.0–17.0)
Immature Granulocytes: 0 %
Lymphocytes Relative: 17 %
Lymphs Abs: 0.9 10*3/uL (ref 0.7–4.0)
MCH: 29.5 pg (ref 26.0–34.0)
MCHC: 31.9 g/dL (ref 30.0–36.0)
MCV: 92.4 fL (ref 80.0–100.0)
Monocytes Absolute: 0.8 10*3/uL (ref 0.1–1.0)
Monocytes Relative: 16 %
Neutro Abs: 3.4 10*3/uL (ref 1.7–7.7)
Neutrophils Relative %: 66 %
Platelets: 98 10*3/uL — ABNORMAL LOW (ref 150–400)
RBC: 3.8 MIL/uL — ABNORMAL LOW (ref 4.22–5.81)
RDW: 15.6 % — ABNORMAL HIGH (ref 11.5–15.5)
WBC: 5.2 10*3/uL (ref 4.0–10.5)
nRBC: 0 % (ref 0.0–0.2)

## 2021-03-25 LAB — COMPREHENSIVE METABOLIC PANEL
ALT: 22 U/L (ref 0–44)
AST: 26 U/L (ref 15–41)
Albumin: 3.5 g/dL (ref 3.5–5.0)
Alkaline Phosphatase: 64 U/L (ref 38–126)
Anion gap: 7 (ref 5–15)
BUN: 20 mg/dL (ref 8–23)
CO2: 29 mmol/L (ref 22–32)
Calcium: 8.8 mg/dL — ABNORMAL LOW (ref 8.9–10.3)
Chloride: 104 mmol/L (ref 98–111)
Creatinine, Ser: 1.64 mg/dL — ABNORMAL HIGH (ref 0.61–1.24)
GFR, Estimated: 47 mL/min — ABNORMAL LOW (ref >60)
Glucose, Bld: 154 mg/dL — ABNORMAL HIGH (ref 70–99)
Potassium: 4 mmol/L (ref 3.5–5.1)
Sodium: 140 mmol/L (ref 135–145)
Total Bilirubin: 0.4 mg/dL (ref 0.3–1.2)
Total Protein: 7.4 g/dL (ref 6.5–8.1)

## 2021-03-25 LAB — RESP PANEL BY RT-PCR (FLU A&B, COVID) ARPGX2
Influenza A by PCR: POSITIVE — AB
Influenza B by PCR: NEGATIVE
SARS Coronavirus 2 by RT PCR: NEGATIVE

## 2021-03-25 LAB — URINALYSIS, ROUTINE W REFLEX MICROSCOPIC
Bilirubin Urine: NEGATIVE
Glucose, UA: NEGATIVE mg/dL
Hgb urine dipstick: NEGATIVE
Ketones, ur: NEGATIVE mg/dL
Leukocytes,Ua: NEGATIVE
Nitrite: NEGATIVE
Protein, ur: NEGATIVE mg/dL
Specific Gravity, Urine: 1.015 (ref 1.005–1.030)
pH: 6 (ref 5.0–8.0)

## 2021-03-25 MED ORDER — SODIUM CHLORIDE 0.9 % IV BOLUS
1000.0000 mL | Freq: Once | INTRAVENOUS | Status: AC
  Administered 2021-03-25: 20:00:00 1000 mL via INTRAVENOUS

## 2021-03-25 NOTE — ED Triage Notes (Addendum)
EMS reports from [facility], called out for fever (100.1 per staff at facility) and cough past 2 days. Pt has Alzheimer's and is at normal baseline A&O X 2 per staff. Has DSS Social Worker/POA? Roselle Locus [phone] contact for care plan/medical decisions. Pt non-febrile at arrival.  BP 180/100 HR 70 RR 18 Sp02 98 RA CBG 252

## 2021-03-25 NOTE — Discharge Instructions (Addendum)
N[name] has been diagnosed with flu A.  Please give him Tylenol as needed for fevers.  Please follow the instructions on the bottle and make sure you do not exceed more than 3000 mg of Tylenol per day.  If he develops new or worsening symptoms please bring him back to the emergency department immediately.

## 2021-03-25 NOTE — ED Notes (Signed)
Tolerated po's  ?

## 2021-03-25 NOTE — ED Provider Notes (Signed)
[facility] DEPT Provider Note   CSN: [id number] Arrival date & time: 03/25/21  1755     History Chief Complaint  Patient presents with   Fever   Cough    Nathan [name] is a 60 y.o. male.  HPI  Patient is a 60 year old male with history of bipolar 1 disorder, MR, schizoaffective disorder, HIV, acute renal failure, CKD, who presents to the emergency department due to fever.  Per EMS report, patient resides at [facility].  They note that he has been experiencing a cough for the past 2 days and they found that he was febrile at 100.1 F so they called EMS to bring him to the emergency department.  They state his baseline is normally A&O x2.  On my exam patient appears fatigued and not providing many answers to questions.  I was unable to reach the patient's listed guardian.  Level 5 caveat due to altered mental status.  Per records, patient with a CD4 absolute of 519 on October 30, 2020.     Past Medical History:  Diagnosis Date   Anemia    Anemia    Anxiety    Bipolar 1 disorder ([place])    Depression, major    HIV (human immunodeficiency virus infection) ([place])    Hyperglycemia 08/06/2017   Hypernatremia    Kidney failure    Lithium toxicity    MR (mental retardation)    Schizoaffective disorder ([place]) 08/06/2017   Schizophrenia ([place])    Seizures ([place])     Patient Active Problem List   Diagnosis Date Noted   Hypokalemia 10/28/2020   Normocytic anemia 10/28/2020   Thrombocytopenia ([place]) 10/28/2020   Pain    Obtundation    Hypothermia    Insomnia 10/25/2020   Pain due to onychomycosis of toenails of both feet 10/25/2020   Sepsis due to pneumonia ([place]) 02/16/2018   Diarrhea 02/16/2018   Gait abnormality 02/09/2018   Hyperglycemia 08/06/2017   Schizoaffective disorder ([place]) 08/06/2017   Bipolar 1 disorder ([place])    Schizophrenia ([place])    Seizure ([place])    Altered mental state 07/20/2016   Aggressive behavior     GERD (gastroesophageal reflux disease) 07/25/2014   Acute encephalopathy 06/23/2014   Hypernatremia 06/20/2014   Dehydration 06/18/2014   Lithium toxicity 06/18/2014   ARF (acute renal failure) ([place]) 06/18/2014   Acute urinary retention 06/18/2014   CAP (community acquired pneumonia) 06/18/2014   Encounter for screening colonoscopy 03/25/2014   Dysphagia, pharyngoesophageal phase 03/25/2014   CKD (chronic kidney disease) stage 3, GFR 30-59 ml/min ([place]) 07/22/2012   HIV infection ([place]) 06/19/2012   Bipolar 1 disorder, mixed, moderate ([place]) 06/19/2012   Hyponatremia 06/19/2012   Intellectual disability 06/19/2012    Past Surgical History:  Procedure Laterality Date   BIOPSY N/A 04/12/2014   Procedure: BIOPSY;  Surgeon: Danie Binder, MD;  Location: [facility];  Service: Endoscopy;  Laterality: N/A;  Gastric   COLONOSCOPY WITH PROPOFOL N/A 04/12/2014   SLF:  1. one colon polyp removed 2. The left colon is extremely redundant 3. Small internal hemorrhoids   ESOPHAGOGASTRODUODENOSCOPY (EGD) WITH PROPOFOL N/A 04/12/2014   SLF: Stricture at the gastroesophageal junction 2. small polyp  in teh gastric body 3. Mild Non-erosive gastritis.    NOSE SURGERY  1968   unsure what type of surgery. something to help him breathe better   POLYPECTOMY N/A 04/12/2014   Procedure: POLYPECTOMY;  Surgeon: Danie Binder, MD;  Location:  [facility];  Service: Endoscopy;  Laterality: N/A;  Cecal   SAVORY DILATION N/A 04/12/2014   Procedure: SAVORY DILATION;  Surgeon: Danie Binder, MD;  Location: [facility];  Service: Endoscopy;  Laterality: N/A;  12.8/ 14/15/16   SKIN BIOPSY         Family History  Problem Relation Age of Onset   Other Mother        unknown medical history   Other Father        unknown medical history   Colon cancer Neg Hx     Social History   Tobacco Use   Smoking status: Never   Smokeless tobacco: Never  Vaping Use   Vaping Use: Never used  Substance Use Topics   Alcohol use: No     Alcohol/week: 0.0 standard drinks   Drug use: No    Home Medications Prior to Admission medications   Medication Sig Start Date End Date Taking? Authorizing Provider  acetaminophen (TYLENOL) 500 MG tablet Take 1 tablet (500 mg total) by mouth every 8 (eight) hours as needed for moderate pain. 06/21/19   Loura Halt A, NP  aspirin EC 81 MG tablet Take 81 mg by mouth every morning. Swallow whole.    [provider]  atorvastatin (LIPITOR) 20 MG tablet Take 20 mg by mouth every morning.    [provider]  benztropine (COGENTIN) 2 MG tablet Take 1 tablet (2 mg total) by mouth 2 (two) times daily. 10/31/20   Kathie Dike, MD  bictegravir-emtricitabine-tenofovir AF (BIKTARVY) 50-200-25 MG TABS tablet Take 1 tablet by mouth daily. 09/24/18   Truman Hayward, MD  cetirizine (ZYRTEC) 10 MG tablet TAKE 1 TABLET ONCE DAILY. 01/21/18   Forrest Moron, MD  cloZAPine (CLOZARIL) 50 MG tablet 7/26 75mg  QHS, 7/27 25mg  AM and 100mg  QHS, 7/28 75mg  AM and 100mg  QHS, 7/29 125mg  AM and 100mg  QHS, 7/30 175 mg AM and 100mg  QHS, 7/31 200mg  AM and 100mg  QHS 10/31/20   Kathie Dike, MD  famotidine (PEPCID) 20 MG tablet Take 20 mg by mouth 2 (two) times daily. 06/10/19   [provider]  fluticasone Asencion Islam) 50 MCG/ACT nasal spray Place 2 sprays into both nostrils every morning. 09/25/20   [provider]  folic acid (FOLVITE) 1 MG tablet Take 1 mg by mouth every morning.    [provider]  gabapentin (NEURONTIN) 300 MG capsule Take 300 mg by mouth every morning. Take with a 400 mg capsule for a total morning dose of 700 mg 06/10/19   [provider]  gabapentin (NEURONTIN) 400 MG capsule Take 400 mg by mouth 2 (two) times daily. In the morning take with a 300 mg capsule for a total morning dose of 700 mg    [provider]  haloperidol (HALDOL) 5 MG tablet Take 2 tablets (10 mg total) by mouth daily with supper. 10/31/20   Kathie Dike, MD   levETIRAcetam (KEPPRA) 750 MG tablet Take 1 tablet (750 mg total) by mouth 2 (two) times daily. 10/31/20   Kathie Dike, MD  metFORMIN (GLUCOPHAGE) 500 MG tablet Take 250 mg by mouth 2 (two) times daily with a meal. 06/10/19   [provider]  polyethylene glycol (MIRALAX / GLYCOLAX) packet Take 17 g by mouth daily. 02/24/18   Thurnell Lose, MD  senna (SENOKOT) 8.6 MG tablet Take 2 tablets by mouth every morning.    [provider]  traZODone (DESYREL) 50 MG tablet Take 1 tablet (50 mg  total) by mouth at bedtime as needed for sleep. 10/25/20   Nwoko, Terese Door, PA  valproic acid (DEPAKENE) 250 MG capsule Take 2 capsules every morning then take 4 capsules at bedtime 10/31/20   Kathie Dike, MD    Allergies    Patient has no known allergies.  Review of Systems   Review of Systems  Unable to perform ROS: Other   Physical Exam Updated Vital Signs BP 133/79    Pulse 86    Temp 97.9 F (36.6 C)    Resp 13    SpO2 97%   Physical Exam Vitals and nursing note reviewed.  Constitutional:      General: He is not in acute distress.    Appearance: He is not ill-appearing, toxic-appearing or diaphoretic.     Comments: Fatigued appearing male lying in the semi-Fowler's position.  Patient will tell me his name but otherwise sleeping in bed.  Does not want to follow commands or answer other questions.  HENT:     Head: Normocephalic and atraumatic.     Right Ear: External ear normal.     Left Ear: External ear normal.     Nose: Nose normal.     Mouth/Throat:     Pharynx: Oropharynx is clear.  Eyes:     General: No scleral icterus.       Right eye: No discharge.        Left eye: No discharge.     Extraocular Movements: Extraocular movements intact.     Conjunctiva/sclera: Conjunctivae normal.  Cardiovascular:     Rate and Rhythm: Normal rate and regular rhythm.     Pulses: Normal pulses.     Heart sounds: Normal heart sounds. No murmur heard.   No friction rub. No  gallop.  Pulmonary:     Effort: Pulmonary effort is normal. No respiratory distress.     Breath sounds: Normal breath sounds. No stridor. No wheezing, rhonchi or rales.  Abdominal:     General: Abdomen is flat.     Palpations: Abdomen is soft.     Tenderness: There is no abdominal tenderness.     Comments: Abdomen is soft.  Patient does not exhibit signs of tenderness with deep palpation.  Musculoskeletal:        General: Normal range of motion.     Cervical back: Normal range of motion and neck supple. No tenderness.  Skin:    General: Skin is warm and dry.  Neurological:     Comments: A&O x2.  Sleeping in bed.  Psychiatric:        Mood and Affect: Mood normal.        Behavior: Behavior normal.   ED Results / Procedures / Treatments   Labs (all labs ordered are listed, but only abnormal results are displayed) Labs Reviewed  RESP PANEL BY RT-PCR (FLU A&B, COVID) ARPGX2 - Abnormal; Notable for the following components:      Result Value   Influenza A by PCR POSITIVE (*)    All other components within normal limits  COMPREHENSIVE METABOLIC PANEL - Abnormal; Notable for the following components:   Glucose, Bld 154 (*)    Creatinine, Ser 1.64 (*)    Calcium 8.8 (*)    GFR, Estimated 47 (*)    All other components within normal limits  CBC WITH DIFFERENTIAL/PLATELET - Abnormal; Notable for the following components:   RBC 3.80 (*)    Hemoglobin 11.2 (*)    HCT 35.1 (*)  RDW 15.6 (*)    Platelets 98 (*)    All other components within normal limits  URINALYSIS, ROUTINE W REFLEX MICROSCOPIC   EKG EKG Interpretation  Date/Time:  Sunday March 25 2021 18:35:13 EST Ventricular Rate:  87 PR Interval:  175 QRS Duration: 94 QT Interval:  330 QTC Calculation: 397 R Axis:   70 Text Interpretation: Sinus rhythm No significant change since last tracing Confirmed by Wandra Arthurs [phone]) on 03/25/2021 7:24:56 PM  Radiology DG Chest Portable 1 View  Result Date:  03/25/2021 CLINICAL DATA:  Fever and weakness for 2 days, initial encounter EXAM: PORTABLE CHEST 1 VIEW COMPARISON:  10/28/2020 FINDINGS: Cardiac shadow is stable. The lungs well aerated bilaterally. No focal infiltrate or sizable effusion is seen. No bony abnormality is noted. IMPRESSION: No acute abnormality seen. Electronically Signed   By: Inez Catalina M.D.   On: 03/25/2021 19:34    Procedures Procedures   Medications Ordered in ED Medications  sodium chloride 0.9 % bolus 1,000 mL (0 mLs Intravenous Stopped 03/25/21 2253)    ED Course  I have reviewed the triage vital signs and the nursing notes.  Pertinent labs & imaging results that were available during my care of the patient were reviewed by me and considered in my medical decision making (see chart for details).  Clinical Course as of 03/25/21 2316  Sun Mar 25, 2021  2054 Influenza A By PCR(!): POSITIVE [LJ]    Clinical Course User Index [LJ] Rayna Sexton, PA-C   MDM Rules/Calculators/A&P                          Pt is a 60 y.o. male who presents to the emergency department due to what appears to be symptoms related to influenza A.  Labs: CBC with RBCs of 3.8, hemoglobin of 11.2, hematocrit of 35.1, RDW 15.6, platelets 98. CMP with a glucose of 154, creatinine 1.64, calcium of 8.8, GFR 47. UA is benign. Respiratory panel is positive for influenza A.  Imaging: Chest x-ray shows no acute abnormality seen.  ECG: Sinus rhythm.  I, Rayna Sexton, PA-C, personally reviewed and evaluated these images and lab results as part of my medical decision-making.  CBC without leukocytosis.  Chest x-ray is negative.  Hemoglobin appears stable at 11.2.  Thrombocytopenia of 98 that has slightly decreased compared to prior values.  CMP with an elevated creatinine of 1.64 as well as a GFR of 47.  Patient does have previous kidney function values that are similar.  Possible AKI given his symptoms so he was given 1 L of IV fluids.   UA is negative.  Patient reassessed on multiple occasions.  Patient appears more alert.  Now answering questions and following commands.  Denies any complaints at this time.  Discussed discharge back to his group home and he is agreeable.  Feel that this is reasonable.  Given his possible AKI will not initiate Tamiflu.  Patient's questions were answered and he was amicable at the time of discharge.  I attempted to reach out to his listed legal guardian unsuccessfully.  Vital signs stable at time of discharge.  Note: Portions of this report may have been transcribed using voice recognition software. Every effort was made to ensure accuracy; however, inadvertent computerized transcription errors may be present.   Final Clinical Impression(s) / ED Diagnoses Final diagnoses:  Influenza A   Rx / DC Orders ED Discharge Orders     None  Rayna Sexton, PA-C 03/25/21 2316    Drenda Freeze, MD 03/26/21 [phone]

## 2021-04-27 ENCOUNTER — Telehealth (HOSPITAL_COMMUNITY): Payer: Self-pay | Admitting: *Deleted

## 2021-04-27 ENCOUNTER — Other Ambulatory Visit (HOSPITAL_COMMUNITY): Payer: Self-pay | Admitting: *Deleted

## 2021-04-27 NOTE — Telephone Encounter (Signed)
Put his absolute neutrophil results into [facility] this am. Reviewed his chart and called his pharmacy as this clinic has not called Clozaril in for him in some time and he has no future appt here. Called the listed pharm and they dont servie him. I see another provider listed on his chart for his clozaril. Will discuss with the provider here, but as we dont appear to be serving him any longer so I will no longer be putting in his lab results into the [facility] system.I Called the group home number of [phone] and it was no longer in service.

## 2021-05-08 ENCOUNTER — Telehealth: Payer: Self-pay

## 2021-05-08 NOTE — Telephone Encounter (Signed)
Patient last seen 09/2018 - patient is currently residing at [facility]. Group home owner answered and recommended we reach out to patient's legal guardian Linna Darner. Called [facility], no answer and unable to leave voicemail.   Did not discuss any private or health information with [facility].   Beryle Flock, RN

## 2021-05-25 ENCOUNTER — Ambulatory Visit: Payer: 59 | Admitting: Podiatry

## 2021-05-28 ENCOUNTER — Encounter: Payer: Self-pay | Admitting: Podiatry

## 2021-05-28 ENCOUNTER — Other Ambulatory Visit: Payer: Self-pay

## 2021-05-28 ENCOUNTER — Ambulatory Visit (INDEPENDENT_AMBULATORY_CARE_PROVIDER_SITE_OTHER): Payer: 59 | Admitting: Podiatry

## 2021-05-28 DIAGNOSIS — B351 Tinea unguium: Secondary | ICD-10-CM | POA: Diagnosis not present

## 2021-05-28 DIAGNOSIS — M79674 Pain in right toe(s): Secondary | ICD-10-CM | POA: Diagnosis not present

## 2021-05-28 DIAGNOSIS — M79675 Pain in left toe(s): Secondary | ICD-10-CM

## 2021-05-28 DIAGNOSIS — D696 Thrombocytopenia, unspecified: Secondary | ICD-10-CM

## 2021-05-28 DIAGNOSIS — N183 Chronic kidney disease, stage 3 unspecified: Secondary | ICD-10-CM

## 2021-05-28 DIAGNOSIS — N1831 Chronic kidney disease, stage 3a: Secondary | ICD-10-CM

## 2021-05-28 NOTE — Progress Notes (Signed)
This patient presents  to my office for at risk foot care.  This patient requires this care by a professional since this patient will be at risk due to having CKD.  This patient is unable to cut nails himself since the patient cannot reach his nails.These nails are painful walking and wearing shoes.  This patient presents for at risk foot care today.  General Appearance  Alert, conversant and in no acute stress.  Vascular  Dorsalis pedis and posterior tibial  pulses are palpable  bilaterally.  Capillary return is within normal limits  bilaterally. Temperature is within normal limits  bilaterally.  Neurologic  deferred  Nails Thick disfigured discolored nails with subungual debris  from hallux to fifth toes bilaterally. No evidence of bacterial infection or drainage bilaterally.  Orthopedic  No limitations of motion  feet .  No crepitus or effusions noted.  No bony pathology or digital deformities noted.  Skin  normotropic skin with no porokeratosis noted bilaterally.  No signs of infections or ulcers noted.     Onychomycosis  Pain in right toes  Pain in left toes  Consent was obtained for treatment procedures.   Mechanical debridement of nails 1-5  bilaterally performed with a nail nipper.  Filed with dremel without incident.    Return office visit    6 months                 Told patient to return for periodic foot care and evaluation due to potential at risk complications.   Gardiner Barefoot DPM

## 2021-06-16 ENCOUNTER — Emergency Department (HOSPITAL_COMMUNITY)
Admission: EM | Admit: 2021-06-16 | Discharge: 2021-06-17 | Disposition: A | Discharge status: Home / Self Care | Payer: 59 | Attending: Emergency Medicine | Admitting: Emergency Medicine

## 2021-06-16 ENCOUNTER — Other Ambulatory Visit: Payer: Self-pay

## 2021-06-16 ENCOUNTER — Encounter (HOSPITAL_COMMUNITY): Payer: Self-pay

## 2021-06-16 DIAGNOSIS — Z79899 Other long term (current) drug therapy: Secondary | ICD-10-CM | POA: Insufficient documentation

## 2021-06-16 DIAGNOSIS — Z7982 Long term (current) use of aspirin: Secondary | ICD-10-CM | POA: Diagnosis not present

## 2021-06-16 DIAGNOSIS — R443 Hallucinations, unspecified: Secondary | ICD-10-CM | POA: Diagnosis present

## 2021-06-16 DIAGNOSIS — R300 Dysuria: Secondary | ICD-10-CM | POA: Diagnosis present

## 2021-06-16 LAB — CBC WITH DIFFERENTIAL/PLATELET
Abs Immature Granulocytes: 0.02 10*3/uL (ref 0.00–0.07)
Basophils Absolute: 0 10*3/uL (ref 0.0–0.1)
Basophils Relative: 0 %
Eosinophils Absolute: 0.6 10*3/uL — ABNORMAL HIGH (ref 0.0–0.5)
Eosinophils Relative: 8 %
HCT: 34.7 % — ABNORMAL LOW (ref 39.0–52.0)
Hemoglobin: 11.3 g/dL — ABNORMAL LOW (ref 13.0–17.0)
Immature Granulocytes: 0 %
Lymphocytes Relative: 23 %
Lymphs Abs: 1.8 10*3/uL (ref 0.7–4.0)
MCH: 30.5 pg (ref 26.0–34.0)
MCHC: 32.6 g/dL (ref 30.0–36.0)
MCV: 93.5 fL (ref 80.0–100.0)
Monocytes Absolute: 0.7 10*3/uL (ref 0.1–1.0)
Monocytes Relative: 9 %
Neutro Abs: 4.7 10*3/uL (ref 1.7–7.7)
Neutrophils Relative %: 60 %
Platelets: 129 10*3/uL — ABNORMAL LOW (ref 150–400)
RBC: 3.71 MIL/uL — ABNORMAL LOW (ref 4.22–5.81)
RDW: 14.4 % (ref 11.5–15.5)
WBC: 7.9 10*3/uL (ref 4.0–10.5)
nRBC: 0 % (ref 0.0–0.2)

## 2021-06-16 LAB — COMPREHENSIVE METABOLIC PANEL
ALT: 17 U/L (ref 0–44)
AST: 19 U/L (ref 15–41)
Albumin: 3.5 g/dL (ref 3.5–5.0)
Alkaline Phosphatase: 68 U/L (ref 38–126)
Anion gap: 3 — ABNORMAL LOW (ref 5–15)
BUN: 23 mg/dL (ref 8–23)
CO2: 29 mmol/L (ref 22–32)
Calcium: 8.9 mg/dL (ref 8.9–10.3)
Chloride: 106 mmol/L (ref 98–111)
Creatinine, Ser: 1.51 mg/dL — ABNORMAL HIGH (ref 0.61–1.24)
GFR, Estimated: 52 mL/min — ABNORMAL LOW (ref >60)
Glucose, Bld: 120 mg/dL — ABNORMAL HIGH (ref 70–99)
Potassium: 4 mmol/L (ref 3.5–5.1)
Sodium: 138 mmol/L (ref 135–145)
Total Bilirubin: 0.3 mg/dL (ref 0.3–1.2)
Total Protein: 7.1 g/dL (ref 6.5–8.1)

## 2021-06-16 LAB — URINALYSIS, ROUTINE W REFLEX MICROSCOPIC
Bilirubin Urine: NEGATIVE
Glucose, UA: NEGATIVE mg/dL
Hgb urine dipstick: NEGATIVE
Ketones, ur: NEGATIVE mg/dL
Leukocytes,Ua: NEGATIVE
Nitrite: NEGATIVE
Protein, ur: NEGATIVE mg/dL
Specific Gravity, Urine: 1.011 (ref 1.005–1.030)
pH: 6 (ref 5.0–8.0)

## 2021-06-16 LAB — ETHANOL: Alcohol, Ethyl (B): <10 mg/dL (ref <10)

## 2021-06-16 MED ORDER — BENZTROPINE MESYLATE 0.5 MG PO TABS
2.0000 mg | ORAL_TABLET | Freq: Two times a day (BID) | ORAL | Status: DC
  Administered 2021-06-16 – 2021-06-17 (×2): 2 mg via ORAL
  Filled 2021-06-16 (×2): qty 4

## 2021-06-16 MED ORDER — VALPROIC ACID 250 MG PO CAPS
250.0000 mg | ORAL_CAPSULE | Freq: Every day | ORAL | Status: DC
Start: 2021-06-17 — End: 2021-06-17
  Filled 2021-06-16 (×2): qty 1

## 2021-06-16 MED ORDER — ATORVASTATIN CALCIUM 10 MG PO TABS
20.0000 mg | ORAL_TABLET | Freq: Every morning | ORAL | Status: DC
  Administered 2021-06-17: 20 mg via ORAL
  Filled 2021-06-16: qty 2

## 2021-06-16 MED ORDER — GABAPENTIN 300 MG PO CAPS
300.0000 mg | ORAL_CAPSULE | Freq: Every morning | ORAL | Status: DC
  Administered 2021-06-17: 300 mg via ORAL
  Filled 2021-06-16: qty 1

## 2021-06-16 MED ORDER — BICTEGRAVIR-EMTRICITAB-TENOFOV 50-200-25 MG PO TABS
1.0000 | ORAL_TABLET | Freq: Every day | ORAL | Status: DC
  Administered 2021-06-17: 1 via ORAL
  Filled 2021-06-16: qty 1

## 2021-06-16 MED ORDER — METFORMIN HCL 500 MG PO TABS
250.0000 mg | ORAL_TABLET | Freq: Two times a day (BID) | ORAL | Status: DC
  Administered 2021-06-17: 250 mg via ORAL
  Filled 2021-06-16: qty 1

## 2021-06-16 MED ORDER — VALPROIC ACID 250 MG PO CAPS
500.0000 mg | ORAL_CAPSULE | Freq: Every day | ORAL | Status: DC
  Administered 2021-06-16: 500 mg via ORAL
  Filled 2021-06-16: qty 2

## 2021-06-16 MED ORDER — HALOPERIDOL 5 MG PO TABS: 10.0000 mg | ORAL_TABLET | Freq: Every day | ORAL | Status: DC

## 2021-06-16 MED ORDER — FAMOTIDINE 20 MG PO TABS
20.0000 mg | ORAL_TABLET | Freq: Two times a day (BID) | ORAL | Status: DC
  Administered 2021-06-16 – 2021-06-17 (×2): 20 mg via ORAL
  Filled 2021-06-16 (×2): qty 1

## 2021-06-16 MED ORDER — GABAPENTIN 100 MG PO CAPS
400.0000 mg | ORAL_CAPSULE | Freq: Two times a day (BID) | ORAL | Status: DC
Start: 2021-06-16 — End: 2021-06-17
  Administered 2021-06-16 – 2021-06-17 (×2): 400 mg via ORAL
  Filled 2021-06-16 (×2): qty 1

## 2021-06-16 MED ORDER — TRAZODONE HCL 100 MG PO TABS
50.0000 mg | ORAL_TABLET | Freq: Every evening | ORAL | Status: DC | PRN
  Administered 2021-06-16: 50 mg via ORAL
  Filled 2021-06-16: qty 1

## 2021-06-16 MED ORDER — SENNA 8.6 MG PO TABS
2.0000 | ORAL_TABLET | Freq: Every morning | ORAL | Status: DC
  Administered 2021-06-17: 17.2 mg via ORAL
  Filled 2021-06-16: qty 2

## 2021-06-16 MED ORDER — LORATADINE 10 MG PO TABS
10.0000 mg | ORAL_TABLET | Freq: Every day | ORAL | Status: DC
  Administered 2021-06-17: 10 mg via ORAL
  Filled 2021-06-16: qty 1

## 2021-06-16 MED ORDER — ASPIRIN EC 81 MG PO TBEC
81.0000 mg | DELAYED_RELEASE_TABLET | Freq: Every morning | ORAL | Status: DC
  Administered 2021-06-17: 81 mg via ORAL
  Filled 2021-06-16: qty 1

## 2021-06-16 MED ORDER — POLYETHYLENE GLYCOL 3350 17 G PO PACK: 17.0000 g | PACK | Freq: Every day | ORAL | Status: DC

## 2021-06-16 MED ORDER — ACETAMINOPHEN 500 MG PO TABS: 500.0000 mg | ORAL_TABLET | Freq: Three times a day (TID) | ORAL | Status: DC | PRN

## 2021-06-16 MED ORDER — CLOZAPINE 100 MG PO TABS
200.0000 mg | ORAL_TABLET | Freq: Every day | ORAL | Status: DC
Start: 2021-06-16 — End: 2021-06-17
  Administered 2021-06-16: 200 mg via ORAL
  Filled 2021-06-16: qty 2

## 2021-06-16 MED ORDER — LEVETIRACETAM 250 MG PO TABS
750.0000 mg | ORAL_TABLET | Freq: Two times a day (BID) | ORAL | Status: DC
Start: 2021-06-16 — End: 2021-06-17
  Administered 2021-06-16 – 2021-06-17 (×2): 750 mg via ORAL
  Filled 2021-06-16 (×2): qty 1

## 2021-06-16 MED ORDER — CLOZAPINE 100 MG PO TABS: 100.0000 mg | ORAL_TABLET | Freq: Every day | ORAL | Status: DC

## 2021-06-16 NOTE — ED Triage Notes (Signed)
Pt BIB EMS from a group home off of [address]. Pt endorses painful urination, but unsure when it started. Per facility a couple days ago pt was acting abnormal and knowcking stuff over. Facility reports today behavior has been to baseline. Hx of developmental delays, behavioral issues, and dementia.  ?  ?HR 93 ?99% RA ?CBG 140 ?

## 2021-06-16 NOTE — Discharge Instructions (Signed)
As discussed, your evaluation today has been largely reassuring.  But, it is important that you monitor your condition carefully, and do not hesitate to return to the ED if you develop new, or concerning changes in your condition. ? ?Otherwise, please follow-up with your physician for appropriate ongoing care. ? ?

## 2021-06-16 NOTE — ED Notes (Addendum)
Unable to reach group home. Will try again later. ? ?

## 2021-06-16 NOTE — ED Provider Notes (Addendum)
?[facility] DEPT ?Provider Note ? ? ?CSN: [id number] ?Arrival date & time: 06/16/21  1430 ? ?  ? ?History ? ?Chief Complaint  ?Patient presents with  ? Dysuria  ? ? ?Nathan [name] is a 61 y.o. male. ? ?HPI ?Patient presents with dysuria, denies abdominal pain, chest pain, fever. ?He is a resident of a group facility, and per notes the patient has complained of dysuria for at least 1 day, possibly had some behavioral disturbance, and is seemingly interacting at baseline according to the most recent note. ?  ? ?Home Medications ?Prior to Admission medications   ?Medication Sig Start Date End Date Taking? Authorizing Provider  ?acetaminophen (TYLENOL) 500 MG tablet Take 1 tablet (500 mg total) by mouth every 8 (eight) hours as needed for moderate pain. 06/21/19   Loura Halt A, NP  ?aspirin EC 81 MG tablet Take 81 mg by mouth every morning. Swallow whole.    [provider]  ?atorvastatin (LIPITOR) 20 MG tablet Take 20 mg by mouth every morning.    [provider]  ?benztropine (COGENTIN) 2 MG tablet Take 1 tablet (2 mg total) by mouth 2 (two) times daily. 10/31/20   Kathie Dike, MD  ?bictegravir-emtricitabine-tenofovir AF (BIKTARVY) 50-200-25 MG TABS tablet Take 1 tablet by mouth daily. 09/24/18   Truman Hayward, MD  ?cetirizine (ZYRTEC) 10 MG tablet TAKE 1 TABLET ONCE DAILY. 01/21/18   Forrest Moron, MD  ?cloZAPine (CLOZARIL) 50 MG tablet 7/26 '75mg'$  QHS, 7/27 '25mg'$  AM and '100mg'$  QHS, 7/28 '75mg'$  AM and '100mg'$  QHS, 7/29 '125mg'$  AM and '100mg'$  QHS, 7/30 175 mg AM and '100mg'$  QHS, 7/31 '200mg'$  AM and '100mg'$  QHS 10/31/20   Kathie Dike, MD  ?famotidine (PEPCID) 20 MG tablet Take 20 mg by mouth 2 (two) times daily. 06/10/19   [provider]  ?fluticasone Asencion Islam) 50 MCG/ACT nasal spray Place 2 sprays into both nostrils every morning. 09/25/20   [provider]  ?folic acid (FOLVITE) 1 MG tablet Take 1 mg by mouth every morning.    [provider]   ?gabapentin (NEURONTIN) 300 MG capsule Take 300 mg by mouth every morning. Take with a 400 mg capsule for a total morning dose of 700 mg 06/10/19   [provider]  ?gabapentin (NEURONTIN) 400 MG capsule Take 400 mg by mouth 2 (two) times daily. In the morning take with a 300 mg capsule for a total morning dose of 700 mg    [provider]  ?haloperidol (HALDOL) 5 MG tablet Take 2 tablets (10 mg total) by mouth daily with supper. 10/31/20   Kathie Dike, MD  ?levETIRAcetam (KEPPRA) 750 MG tablet Take 1 tablet (750 mg total) by mouth 2 (two) times daily. 10/31/20   Kathie Dike, MD  ?metFORMIN (GLUCOPHAGE) 500 MG tablet Take 250 mg by mouth 2 (two) times daily with a meal. 06/10/19   [provider]  ?polyethylene glycol (MIRALAX / GLYCOLAX) packet Take 17 g by mouth daily. 02/24/18   Thurnell Lose, MD  ?senna (SENOKOT) 8.6 MG tablet Take 2 tablets by mouth every morning.    [provider]  ?traZODone (DESYREL) 50 MG tablet Take 1 tablet (50 mg total) by mouth at bedtime as needed for sleep. 10/25/20   Malachy Mood, PA  ?valproic acid (DEPAKENE) 250 MG capsule Take 2 capsules every morning then take 4 capsules at bedtime 10/31/20   Kathie Dike, MD  ?   ? ?Allergies    ?Patient  has no known allergies.   ? ?Review of Systems   ?Review of Systems  ?Constitutional:   ?     Per HPI, otherwise negative  ?HENT:    ?     Per HPI, otherwise negative  ?Respiratory:    ?     Per HPI, otherwise negative  ?Cardiovascular:   ?     Per HPI, otherwise negative  ?Gastrointestinal:  Negative for vomiting.  ?Endocrine:  ?     Negative aside from HPI  ?Genitourinary:   ?     Neg aside from HPI   ?Musculoskeletal:   ?     Per HPI, otherwise negative  ?Skin: Negative.   ?Neurological:  Negative for syncope.  ? ?Physical Exam ?Updated Vital Signs ?BP 132/83 (BP Location: Left Arm)   Pulse 86   Temp 97.9 ?F (36.6 ?C)   Resp 18   SpO2 100%  ?Physical Exam ?Vitals and nursing note reviewed.   ?Constitutional:   ?   General: He is not in acute distress. ?   Appearance: He is well-developed.  ?HENT:  ?   Head: Normocephalic and atraumatic.  ?Eyes:  ?   Conjunctiva/sclera: Conjunctivae normal.  ?Cardiovascular:  ?   Rate and Rhythm: Normal rate and regular rhythm.  ?Pulmonary:  ?   Effort: Pulmonary effort is normal. No respiratory distress.  ?   Breath sounds: No stridor.  ?Abdominal:  ?   General: There is no distension.  ?   Tenderness: There is no abdominal tenderness. There is no guarding.  ?Skin: ?   General: Skin is warm and dry.  ?Neurological:  ?   Mental Status: He is alert and oriented to person, place, and time.  ? ? ?ED Results / Procedures / Treatments   ?Labs ?(all labs ordered are listed, but only abnormal results are displayed) ?Labs Reviewed  ?CBC WITH DIFFERENTIAL/PLATELET - Abnormal; Notable for the following components:  ?    Result Value  ? RBC 3.71 (*)   ? Hemoglobin 11.3 (*)   ? HCT 34.7 (*)   ? Platelets 129 (*)   ? Eosinophils Absolute 0.6 (*)   ? All other components within normal limits  ?ETHANOL  ?COMPREHENSIVE METABOLIC PANEL  ?URINALYSIS, ROUTINE W REFLEX MICROSCOPIC  ? ? ?EKG ?None ? ?Radiology ?No results found. ? ?Procedures ?Procedures  ? ? ?Medications Ordered in ED ?Medications - No data to display ? ?ED Course/ Medical Decision Making/ A&P ? This patient presents to the ED for concern of dysuria, possible behavioral disturbance in the context of patient with cognitive disability, this involves an extensive number of treatment options, and is a complaint that carries with it a high risk of complications and morbidity.  The differential diagnosis includes UTI, nephrolithiasis, bacteremia, sepsis, electrolyte abnormality ? ? ?Co morbidities that complicate the patient evaluation ? ?Cognitive limitation ? ? ?Social Determinants of Health: ? ?Cognitive limitation, group home residency ? ? ?Additional history obtained: ? ?Additional history and/or information obtained from  as above group home notes ?External records from outside source obtained and reviewed including report of behavioral disturbance over the past days ? ? ?After the initial evaluation, orders, including: Labs, urinalysis were initiated. ? ?Patient placed on Cardiac and Pulse-Oximetry Monitors. ?The patient was maintained on a cardiac monitor.  The cardiac monitored showed an rhythm of 80 sinus normal ?The patient was also maintained on pulse oximetry. The readings were typically 100% room air normal ? ?On repeat evaluation of the patient improved ? ?  Lab Tests: ? ?I personally interpreted labs.  The pertinent results include: No evidence for urinary tract infection, bacteremia, sepsis, no substantial electrolyte abnormalities ? ? ?Dispostion / Final MDM: ? ?After consideration of the diagnostic results and the patient's response to treatment, adult male, cognitively impaired presents from nursing/group home with 1 day of possible dysuria.  Patient awake, alert, afebrile, speaking clearly, denies complaints during my evaluation and has a reassuring physical exam.  No evidence for bacteremia, sepsis, and no evidence for urinary tract infection.  Patient does have trivially elevated creatinine value, suggesting possible mild dehydration with urethritis.  Patient encouraged to drink fluids, but with otherwise reassuring findings, no indication for medical admission.  However, just prior to discharge I discussed his return to his group facility, they request psychiatry consult stating the patient has had new hallucinations. ? ?Final Clinical Impression(s) / ED Diagnoses ?Final diagnoses:  ?Dysuria  ? ?  ?Carmin Muskrat, MD ?06/16/21 1834 ? ?  ?Carmin Muskrat, MD ?06/16/21 2244 ? ?

## 2021-06-17 DIAGNOSIS — R300 Dysuria: Secondary | ICD-10-CM | POA: Diagnosis not present

## 2021-06-17 MED ORDER — VALPROIC ACID 250 MG PO CAPS: 500.0000 mg | ORAL_CAPSULE | Freq: Every day | ORAL | Status: DC

## 2021-06-17 MED ORDER — CLOZAPINE 100 MG PO TABS: 200.0000 mg | ORAL_TABLET | Freq: Every day | ORAL | Status: DC

## 2021-06-17 MED ORDER — VALPROIC ACID 250 MG PO CAPS
1000.0000 mg | ORAL_CAPSULE | Freq: Every day | ORAL | Status: DC
Start: 2021-06-17 — End: 2021-06-17

## 2021-06-17 MED ORDER — CLOZAPINE 100 MG PO TABS
100.0000 mg | ORAL_TABLET | Freq: Every day | ORAL | Status: DC
Start: 2021-06-17 — End: 2021-06-17

## 2021-06-17 NOTE — ED Notes (Addendum)
Spoke to representative from [facility] and provided an update.  She was unaware Group Home Director had stated someone would be picking him up at [facility].  She is going to reach out to [employer] and call this Probation officer back.  ? ?Per provider note, Pt's guardian has been updated and aware Pt is up for d/c.  ?

## 2021-06-17 NOTE — ED Notes (Signed)
All belongings returned to Pt and Pt wheeled out.  AVS provided to Group Home Rep.  ?

## 2021-06-17 NOTE — BH Assessment (Signed)
Comprehensive Clinical Assessment (CCA) Note  06/17/2021 Nathan [name] [id number]  Nathan Reasoner, NP, reviewed pt's chart and information and determined pt can be psych cleared. This information was relayed to pt's team at 0137.  The patient demonstrates the following risk factors for suicide: Chronic risk factors for suicide include: psychiatric disorder of Bipolar 1 disorder . Acute risk factors for suicide include: unemployment. Protective factors for this patient include: positive therapeutic relationship. Considering these factors, the overall suicide risk at this point appears to be none. Patient is appropriate for outpatient follow up.  Therefore, no sitter is recommended for suicide precautions.  [place] ED from 06/16/2021 in [place] DEPT ED from 03/25/2021 in [place] DEPT Video Visit from 03/15/2021 in [place] No Risk No Risk No Risk     Chief Complaint:  Chief Complaint  Patient presents with   Dysuria   Visit Diagnosis: Bipolar 1 disorder   CCA Screening, Triage and Referral (STR) Nathan [name] is a 61 year old male who was brought the the [facility] due to painful urination for 1 day. Upon the hospital's attempt to d/c pt, his group home stated he had been experiencing "new hallucinations" and requested a psych consult."  Contact wtih Linna Darner, [facility] caseworker, was made at [place]; she was made aware of pt being brough to the ED for painful urination, that a psych screen was performed, and that pt's group home will be notified to come pick him up.  Pt denies SI or a hx of SI. He denies he's ever attempted to kill himself. Pt denies HI or AVH. Pt was asked if he's ever experienced hallucinations in the past and he denies this.  Pt's orientation and memory were UTA. Pt was cooperative throughout the assessment process. Pt's insight,  judgement, and impulse control is fair at this time.  Patient Reported Information How did you hear about [place]? Other (Comment) (Group Home)  What Is the Reason for Your Visit/Call Today? Per EDP note: "Patient awake, alert, afebrile, speaking clearly, denies complaints during my evaluation and has a reassuring physical exam. No evidence for bacteremia, sepsis, and no evidence for urinary tract infection. Patient does have trivially elevated creatinine value, suggesting possible mild dehydration with urethritis. Patient encouraged to drink fluids, but with otherwise reassuring findings, no indication for medical admission. However, just prior to discharge I discussed his return to his group facility, they request psychiatry consult stating the patient has had new hallucinations." Pt denies SI or a hx of SI. He denies he's ever attempted to kill himself. Pt denies HI or AVH. Pt was asked if he's ever experienced hallucinations in the past and he denies this.  How Long Has This Been Causing You Problems? <Week  What Do You Feel Would Help You the Most Today? -- (None noted)   Have You Recently Had Any Thoughts About Hurting Yourself? No  Are You Planning to Commit Suicide/Harm Yourself At This time? No   Have you Recently Had Thoughts About [place]? No  Are You Planning to Harm Someone at This Time? No  Explanation: No data recorded  Have You Used Any Alcohol or Drugs in the Past 24 Hours? No  How Long Ago Did You Use Drugs or Alcohol? No data recorded What Did You Use and How Much? No data recorded  Do You Currently Have a Therapist/Psychiatrist? -- (UTA)  Name of Therapist/Psychiatrist: No data recorded  Have You Been Recently Discharged From Any Office Practice or Programs? No  Explanation of Discharge From Practice/Program: No data recorded    CCA Screening Triage Referral Assessment Type of Contact: Tele-Assessment  Telemedicine Service Delivery: Telemedicine  service delivery: This service was provided via telemedicine using a 2-way, interactive audio and video technology  Is this Initial or Reassessment? Initial Assessment  Date Telepsych consult ordered in [facility]:  06/16/21  Time Telepsych consult ordered in [facility]:  1910  Location of Assessment: [facility] ED  Provider Location: [facility] Assessment Services   Collateral Involvement: Provider notes   Does Patient Have a Country Homes? No data recorded Name and Contact of Legal Guardian: No data recorded If Minor and Not Living with Parent(s), Who has Custody? N/A  Is CPS involved or ever been involved? -- (UTA)  Is APS involved or ever been involved? -- (UTA)   Patient Determined To Be At Risk for Harm To Self or Others Based on Review of Patient Reported Information or Presenting Complaint? No  Method: No data recorded Availability of Means: No data recorded Intent: No data recorded Notification Required: No data recorded Additional Information for Danger to Others Potential: No data recorded Additional Comments for Danger to Others Potential: No data recorded Are There Guns or Other Weapons in Your Home? No data recorded Types of Guns/Weapons: No data recorded Are These Weapons Safely Secured?                            No data recorded Who Could Verify You Are Able To Have These Secured: No data recorded Do You Have any Outstanding Charges, Pending Court Dates, Parole/Probation? No data recorded Contacted To Inform of Risk of Harm To Self or Others: -- (N/A)    Does Patient Present under Involuntary Commitment? No  IVC Papers Initial File Date: No data recorded  [place] of Residence: [place]   Patient Currently Receiving the Following Services: Group Home   Determination of Need: Routine (7 days)   Options For Referral: Medication Management     CCA Biopsychosocial Patient Reported Schizophrenia/Schizoaffective Diagnosis in Past: -- (UTA)   Strengths:  Pt is able to answer the question posed. He has consistent housing.   Mental Health Symptoms Depression:   None   Duration of Depressive symptoms:    Mania:   None   Anxiety:    None   Psychosis:   Hallucinations (Per pt's group home)   Duration of Psychotic symptoms:    Trauma:   None   Obsessions:   None   Compulsions:   None   Inattention:   None   Hyperactivity/Impulsivity:   None   Oppositional/Defiant Behaviors:   None   Emotional Irregularity:   Mood lability   Other Mood/Personality Symptoms:   None noted    Mental Status Exam Appearance and self-care  Stature:   Average   Weight:   Average weight   Clothing:   -- (Pt is dressed in hospital scrubs)   Grooming:   Normal   Cosmetic use:   None   Posture/gait:   Normal   Motor activity:   Not Remarkable   Sensorium  Attention:   Normal   Concentration:   Normal   Orientation:   -- (UTA)   Recall/memory:   -- (UTA)   Affect and Mood  Affect:   Appropriate   Mood:   Euthymic   Relating  Eye contact:  Normal   Facial expression:   Responsive   Attitude toward examiner:   Cooperative   Thought and Language  Speech flow:  Slurred   Thought content:   Appropriate to Mood and Circumstances   Preoccupation:   None   Hallucinations:   -- (Pt denies)   Organization:  No data recorded  [employer] of Knowledge:   Fair   Intelligence:   Needs investigation   Abstraction:   Normal   Judgement:   Fair   Reality Testing:   Variable   Insight:   Fair   Decision Making:   Only simple   Social Functioning  Social Maturity:   Impulsive   Social Judgement:   Naive   Stress  Stressors:   Housing   Coping Ability:   Programme researcher, broadcasting/film/video Deficits:   Activities of daily living; Decision making   Supports:   Friends/Service system     Religion: Religion/Spirituality Are You A Religious Person?:  (Not assessed) How  Might This Affect Treatment?: Not assessed  Leisure/Recreation: Leisure / Recreation Do You Have Hobbies?:  (Not assessed)  Exercise/Diet: Exercise/Diet Do You Exercise?:  (Not assessed) Have You Gained or Lost A Significant Amount of Weight in the Past Six Months?:  (Not assessed) Do You Follow a Special Diet?:  (Not assessed) Do You Have Any Trouble Sleeping?:  (Not assessed)   CCA Employment/Education Employment/Work Situation: Employment / Work Situation Employment Situation: On disability Why is Patient on Disability: IDD How Long has Patient Been on Disability: Unknown Patient's Job has Been Impacted by Current Illness:  (N/A) Has Patient ever Been in the [employer]?: No  Education: Education Is Patient Currently Attending School?: No Last Grade Completed:  (Not assessed) Did You Attend College?: No Did You Have An Individualized Education Program (IIEP):  (Not assessed) Did You Have Any Difficulty At School?:  (Not assessed) Patient's Education Has Been Impacted by Current Illness: No   CCA Family/Childhood History Family and Relationship History: Family history Marital status: Single Does patient have children?: No  Childhood History:  Childhood History By whom was/is the patient raised?:  (Not assessed) Did patient suffer any verbal/emotional/physical/sexual abuse as a child?:  (Not assessed) Did patient suffer from severe childhood neglect?:  (Not assessed) Has patient ever been sexually abused/assaulted/raped as an adolescent or adult?:  (Not assessed) Was the patient ever a victim of a crime or a disaster?:  (Not assessed) Witnessed domestic violence?:  (Not assessed) Has patient been affected by domestic violence as an adult?:  (Not assessed)  Child/Adolescent Assessment:     CCA Substance Use Alcohol/Drug Use: Alcohol / Drug Use Pain Medications: Please see MAR Prescriptions: Please see MAR Over the Counter: Please see MAR History of alcohol /  drug use?: No history of alcohol / drug abuse Longest period of sobriety (when/how long): N/A Negative Consequences of Use:  (N/A) Withdrawal Symptoms:  (N/A)                         ASAM's:  Six Dimensions of Multidimensional Assessment  Dimension 1:  Acute Intoxication and/or Withdrawal Potential:      Dimension 2:  Biomedical Conditions and Complications:      Dimension 3:  Emotional, Behavioral, or Cognitive Conditions and Complications:     Dimension 4:  Readiness to Change:     Dimension 5:  Relapse, Continued use, or Continued Problem Potential:     Dimension 6:  Recovery/Living Environment:  ASAM Severity Score:    ASAM Recommended Level of Treatment: ASAM Recommended Level of Treatment:  (N/A)   Substance use Disorder (SUD) Substance Use Disorder (SUD)  Checklist Symptoms of Substance Use:  (N/A)  Recommendations for Services/Supports/Treatments: Recommendations for Services/Supports/Treatments Recommendations For Services/Supports/Treatments: Medication Management  Discharge Disposition:    DSM5 Diagnoses: Patient Active Problem List   Diagnosis Date Noted   Hypokalemia 10/28/2020   Normocytic anemia 10/28/2020   Thrombocytopenia ([place]) 10/28/2020   Pain    Obtundation    Hypothermia    Insomnia 10/25/2020   Pain due to onychomycosis of toenails of both feet 10/25/2020   Sepsis due to pneumonia ([place]) 02/16/2018   Diarrhea 02/16/2018   Gait abnormality 02/09/2018   Hyperglycemia 08/06/2017   Schizoaffective disorder ([place]) 08/06/2017   Bipolar 1 disorder ([place])    Schizophrenia ([place])    Seizure ([place])    Altered mental state 07/20/2016   Aggressive behavior    GERD (gastroesophageal reflux disease) 07/25/2014   Acute encephalopathy 06/23/2014   Hypernatremia 06/20/2014   Dehydration 06/18/2014   Lithium toxicity 06/18/2014   ARF (acute renal failure) ([place]) 06/18/2014   Acute urinary retention 06/18/2014   CAP (community acquired pneumonia)  06/18/2014   Encounter for screening colonoscopy 03/25/2014   Dysphagia, pharyngoesophageal phase 03/25/2014   CKD (chronic kidney disease) stage 3, GFR 30-59 ml/min ([place]) 07/22/2012   HIV infection ([place]) 06/19/2012   Bipolar 1 disorder, mixed, moderate ([place]) 06/19/2012   Hyponatremia 06/19/2012   Intellectual disability 06/19/2012     Referrals to Alternative Service(s): Referred to Alternative Service(s):   Place:   Date:   Time:    Referred to Alternative Service(s):   Place:   Date:   Time:    Referred to Alternative Service(s):   Place:   Date:   Time:    Referred to Alternative Service(s):   Place:   Date:   Time:     Dannielle Burn, LMFT

## 2021-06-17 NOTE — ED Notes (Signed)
This writer was made aware the Group Home is here to pick him up.  Rep would not come to room to sign for d/c.  ?

## 2021-06-17 NOTE — ED Notes (Signed)
Pt noted to be sleeping soundly when this Probation officer entered the room.  Upon obtaining vitals, Pt reports calm and cooperative.   ? ?Waiting on Pharmacy to send home medications.     ?

## 2021-06-17 NOTE — ED Notes (Signed)
Per Night Shift RN, Group Home has been called and they cannot come to get him until around 11a.  Charge RN aware.  ?

## 2021-06-17 NOTE — ED Notes (Addendum)
RN contacted group home director, [place], to let her know pt has been psych and medically cleared. She is not able to come get pt until 11am due to fact that she is covering staffing issues she is having at group home. RN discussed with charge RN and pt will remain until ride can get him.  ?

## 2021-07-08 ENCOUNTER — Ambulatory Visit (HOSPITAL_COMMUNITY)
Admission: EM | Admit: 2021-07-08 | Discharge: 2021-07-08 | Disposition: A | Discharge status: Home / Self Care | Payer: 59 | Attending: Behavioral Health | Admitting: Behavioral Health

## 2021-07-08 DIAGNOSIS — Z79899 Other long term (current) drug therapy: Secondary | ICD-10-CM | POA: Insufficient documentation

## 2021-07-08 DIAGNOSIS — R443 Hallucinations, unspecified: Secondary | ICD-10-CM | POA: Diagnosis present

## 2021-07-08 NOTE — BH Assessment (Addendum)
Nathan [name], Routine; 61 year old presents  voluntarily to [facility] via [employer].  Pt denies SI, and HI, Drug/Alcohol use.  Pt reports hearing voices," the voices are  telling me that I don't feel good".  Pt's Group Home Owner reports " he is hearing voices, talking to himself; also, aggressive towards the staff" .  [place], [place], [phone], recommends that he gets evaluated and admitted, before returning back to the group home, I have five other residents here, I am concern for safety".  Pt guardian, Glenna Durand, [phone].  Pt admits to prior MH diagnosis or prescribed medication for symptom management.  MSE signed by patient.  ? ? ?

## 2021-07-08 NOTE — ED Provider Notes (Signed)
Behavioral Health Urgent Care Medical Screening Exam ? ?Patient Name: Nathan [name] ?MRN: [id number] ?Date of Evaluation: 07/08/21 ?Diagnosis:  ?Final diagnoses:  ?Hallucinations  ? ? ?History of Present illness: Nathan [name] is a 61 y.o. male patient who presents to the [facility] behavioral health urgent care voluntary as a walk-in accompanied by his [employer] the with a chief complaint of "hearing voices." Patient has a hx of IDD and schizophrenia.  ? ?Patient seen and evaluated face-to-face by this provider, and chart reviewed. On evaluation, patient is alert and oriented to person, place and time. His thought process is linear. His speech is coherent and slow. His mood is dysphoric and affect is congruent. He is calm and cooperative. ? ?Patient reports hearing voices for a while that he describes as "anger, laughter, and calling him slow and retarded." He denies visual hallucinations. He denies paranoia thought content. There is no objective evidence that the patient is currently responding to internal or external stimuli or exhibiting delusional or paranoia thought content. He denies aggressive behaviors. He reports fair sleep. He reports a fair appetite. He denies drinking alcohol or using illicit drugs. He denies physical complaints. He states that he lives at [address]. He states that this legal guardian is Nurse, children's.  ? ?I spoke to the patient's group home supervisor Nathan [name] [phone] who states that the patient needs an evaluation. She was informed that the patient was evaluated and is not in a psychotic state or SI/HI. She was advised that the patient will need to follow up with his outpatient psychiatrist for medication adjustments to address ongoing AH. She advised this provider to contact the patient's legal guardian Nathan [name]. This provider contacted Nathan [name] [phone] via telephone, who does not report any safety concerns. She was advised that the patient was evaluated and  will be discharging back to the group home today. She asked if the group home has been contacted. She was advised that I spoke with Nathan [name] to inform her that the patient will be returning back to the group home and is recommended to follow up with current outpatient provider for medication adjustments to address AH. ? ?Per N IVC, patient does not meet criteria. Patient may benefit from outpatient services/medication adjustment to address ongoing AH. Patient is not exhibiting any aggressive on evaluation. He denies SI/HI. Per [employer], patient was not aggressive when they arrived to the group home and was cooperative.  ? ?Psychiatric Specialty Exam ? ?Presentation  ?General Appearance:Appropriate for Environment ? ?Eye Contact:Fair ? ?Speech:Clear and Coherent ? ?Speech Volume:Normal ? ?Handedness:No data recorded ? ?Mood and Affect  ?Mood:Dysphoric ? ?Affect:Congruent ? ? ?Thought Process  ?Thought Processes:Coherent; Linear ? ?Descriptions of Associations:Intact ? ?Orientation:Full (Time, Place and Person) ? ?Thought Content:Logical ? Diagnosis of Schizophrenia or Schizoaffective disorder in past: Yes ?  Hallucinations:Auditory ? ?Ideas of Reference:None ? ?Suicidal Thoughts:No ? ?Homicidal Thoughts:No ? ? ?Sensorium  ?Memory:Immediate Fair; Remote Fair; Recent Fair ? ?Judgment:Intact ? ?Insight:Present ? ? ?Executive Functions  ?Concentration:Fair ? ?Attention Span:Fair ? ?Recall:Fair ? ?[place] ? ?Language:Fair ? ? ?Psychomotor Activity  ?Psychomotor Activity:Decreased ? ? ?Assets  ?Assets:Communication Skills; Desire for Improvement; Housing; Physical Health; Social Support ? ? ?Sleep  ?Sleep:Fair ? ?Number of hours: No data recorded ? ?No data recorded ? ?Physical Exam: ?Physical Exam ?Constitutional:   ?   Appearance: Normal appearance.  ?HENT:  ?   Head: Normocephalic.  ?   Nose: Nose normal.  ?Eyes:  ?  Conjunctiva/sclera: Conjunctivae normal.  ?Cardiovascular:  ?   Rate and Rhythm: Normal rate.   ?Pulmonary:  ?   Effort: Pulmonary effort is normal.  ?Musculoskeletal:     ?   General: Normal range of motion.  ?   Cervical back: Normal range of motion.  ?Neurological:  ?   Mental Status: He is alert and oriented to person, place, and time.  ? ?Review of Systems  ?Constitutional: Negative.   ?HENT: Negative.    ?Eyes: Negative.   ?Respiratory: Negative.    ?Cardiovascular: Negative.   ?Gastrointestinal: Negative.   ?Genitourinary: Negative.   ?Musculoskeletal: Negative.   ?Skin: Negative.   ?Neurological: Negative.   ?Endo/Heme/Allergies: Negative.   ?Blood pressure 130/79, pulse (!) 102, temperature 98.4 ?F (36.9 ?C), temperature source Oral, resp. rate 18, SpO2 98 %. There is no height or weight on file to calculate BMI. ? ?Musculoskeletal: ?Strength & Muscle Tone: within normal limits ?Gait & Station: normal ?Patient leans: N/A ? ? ?[facility] MSE Discharge Disposition for Follow up and Recommendations: ?Based on my evaluation the patient does not appear to have an emergency medical condition and can be discharged with resources and follow up care in outpatient services for Medication Management ? ? ?Marissa Calamity, NP ?07/08/2021, 3:41 PM ? ?

## 2021-07-23 ENCOUNTER — Telehealth: Payer: Self-pay

## 2021-07-23 NOTE — Telephone Encounter (Signed)
Called patient to schedule appointment, group home owner answered. She would not schedule appointment without further details as to why appointment is necessary. She is not listed as patient's emergency contact or legal guardian. Advised that appointment is for medication check and refill.  ? ?She requested additional information, did not disclose any private or health information. RN requested to speak with patient or his legal guardian. She provided number for the group home, this was a wrong number.  ? ?Attempted to call patient's legal guardian, Linna Darner, call could not be completed.  ? ?Beryle Flock, RN ? ?

## 2021-07-26 NOTE — Telephone Encounter (Signed)
Per front desk, group home owner called asking for refills of Biktarvy. Provider has not prescribed since 09/2018, asked front desk to please let group home owner know that refills can be provided at upcoming appointment. ? ?Beryle Flock, RN ? ?

## 2021-07-31 ENCOUNTER — Ambulatory Visit (INDEPENDENT_AMBULATORY_CARE_PROVIDER_SITE_OTHER): Payer: 59 | Admitting: Infectious Disease

## 2021-07-31 ENCOUNTER — Encounter: Payer: Self-pay | Admitting: Infectious Disease

## 2021-07-31 ENCOUNTER — Other Ambulatory Visit: Payer: Self-pay

## 2021-07-31 VITALS — BP 148/92 | HR 84 | Temp 97.2°F | Wt 183.0 lb

## 2021-07-31 DIAGNOSIS — R569 Unspecified convulsions: Secondary | ICD-10-CM

## 2021-07-31 DIAGNOSIS — D696 Thrombocytopenia, unspecified: Secondary | ICD-10-CM

## 2021-07-31 DIAGNOSIS — N1831 Chronic kidney disease, stage 3a: Secondary | ICD-10-CM

## 2021-07-31 DIAGNOSIS — F259 Schizoaffective disorder, unspecified: Secondary | ICD-10-CM | POA: Diagnosis not present

## 2021-07-31 DIAGNOSIS — B20 Human immunodeficiency virus [HIV] disease: Secondary | ICD-10-CM

## 2021-07-31 HISTORY — DX: Human immunodeficiency virus (HIV) disease: B20

## 2021-07-31 MED ORDER — BIKTARVY 50-200-25 MG PO TABS: 1.0000 | ORAL_TABLET | Freq: Every day | ORAL | 11 refills | Status: DC

## 2021-07-31 NOTE — Progress Notes (Signed)
? ?Subjective:  ?Chief complaint: Constipation ? Patient ID: Nathan [name], male    DOB: [birth year], 61 y.o.   MRN: [id number] ? ?HPI ? ?[name] is a 61 year old black man with multiple medical problems including HIV that has been well controlled cognitive disability with aggressive behavior at times bipolar schizoaffective disorder. ? ?His viral load has been nicely controlled on Biktarvy. ? ?He has been doing well he complains of some constipation today which is we will follow-up with PCP about. ? ?He was recently hospital for hearing voices but did not meet criteria for involuntary commitment. ? ?He is no longer having as many problems he is fairly quiet during today's exam and does not say too much other than his complained about constipation ? ? ? ?Past Medical History:  ?Diagnosis Date  ? Anemia   ? Anemia   ? Anxiety   ? Bipolar 1 disorder ([place])   ? Depression, major   ? HIV (human immunodeficiency virus infection) ([place])   ? Hyperglycemia 08/06/2017  ? Hypernatremia   ? Kidney failure   ? Lithium toxicity   ? MR (mental retardation)   ? Schizoaffective disorder ([place]) 08/06/2017  ? Schizophrenia ([place])   ? Seizures ([place])   ? ? ?Past Surgical History:  ?Procedure Laterality Date  ? BIOPSY N/A 04/12/2014  ? Procedure: BIOPSY;  Surgeon: Danie Binder, MD;  Location: [facility];  Service: Endoscopy;  Laterality: N/A;  Gastric  ? COLONOSCOPY WITH PROPOFOL N/A 04/12/2014  ? SLF:  1. one colon polyp removed 2. The left colon is extremely redundant 3. Small internal hemorrhoids  ? ESOPHAGOGASTRODUODENOSCOPY (EGD) WITH PROPOFOL N/A 04/12/2014  ? SLF: Stricture at the gastroesophageal junction 2. small polyp  in teh gastric body 3. Mild Non-erosive gastritis.   ? NOSE SURGERY  1968  ? unsure what type of surgery. something to help him breathe better  ? POLYPECTOMY N/A 04/12/2014  ? Procedure: POLYPECTOMY;  Surgeon: Danie Binder, MD;  Location: [facility];  Service: Endoscopy;  Laterality: N/A;  Cecal  ? SAVORY DILATION N/A 04/12/2014  ?  Procedure: SAVORY DILATION;  Surgeon: Danie Binder, MD;  Location: [facility];  Service: Endoscopy;  Laterality: N/A;  12.8/ 14/15/16  ? SKIN BIOPSY    ? ? ?Family History  ?Problem Relation Age of Onset  ? Other Mother   ?     unknown medical history  ? Other Father   ?     unknown medical history  ? Colon cancer Neg Hx   ? ? ?  ?Social History  ? ?Socioeconomic History  ? Marital status: Single  ?  Spouse name: Not on file  ? Number of children: 0  ? Years of education: 9th grade  ? Highest education level: Not on file  ?Occupational History  ? Occupation: Disabled  ?Tobacco Use  ? Smoking status: Never  ? Smokeless tobacco: Never  ?Vaping Use  ? Vaping Use: Never used  ?Substance and Sexual Activity  ? Alcohol use: No  ?  Alcohol/week: 0.0 standard drinks  ? Drug use: No  ? Sexual activity: Never  ?  Birth control/protection: Abstinence  ?  Comment: DECLINED CONDOMS  ?Other Topics Concern  ? Not on file  ?Social History Narrative  ? Lives in group home.  ? Right-handed.  ? 2-3 sodas per day.  ? ?Social Determinants of Health  ? ?Financial Resource Strain: Not on file  ?Food Insecurity: Not on file  ?Transportation Needs: Not on file  ?  Physical Activity: Not on file  ?Stress: Not on file  ?Social Connections: Not on file  ? ? ?No Known Allergies ? ? ?Current Outpatient Medications:  ?  acetaminophen (TYLENOL) 500 MG tablet, Take 1 tablet (500 mg total) by mouth every 8 (eight) hours as needed for moderate pain., Disp: 30 tablet, Rfl: 0 ?  aspirin EC 81 MG tablet, Take 81 mg by mouth every morning. Swallow whole., Disp: , Rfl:  ?  atorvastatin (LIPITOR) 20 MG tablet, Take 20 mg by mouth at bedtime., Disp: , Rfl:  ?  benztropine (COGENTIN) 2 MG tablet, Take 1 tablet (2 mg total) by mouth 2 (two) times daily., Disp: 60 tablet, Rfl: 2 ?  bictegravir-emtricitabine-tenofovir AF (BIKTARVY) 50-200-25 MG TABS tablet, Take 1 tablet by mouth daily. (Patient taking differently: Take 1 tablet by mouth every morning.), Disp: 30  tablet, Rfl: 11 ?  cetirizine (ZYRTEC) 10 MG tablet, TAKE 1 TABLET ONCE DAILY. (Patient taking differently: Take 10 mg by mouth every morning.), Disp: 30 tablet, Rfl: 3 ?  cloZAPine (CLOZARIL) 100 MG tablet, Take 100 mg by mouth at bedtime., Disp: , Rfl:  ?  clozapine (CLOZARIL) 200 MG tablet, Take 200 mg by mouth every morning., Disp: , Rfl:  ?  cloZAPine (CLOZARIL) 50 MG tablet, 7/26 '75mg'$  QHS, 7/27 '25mg'$  AM and '100mg'$  QHS, 7/28 '75mg'$  AM and '100mg'$  QHS, 7/29 '125mg'$  AM and '100mg'$  QHS, 7/30 175 mg AM and '100mg'$  QHS, 7/31 '200mg'$  AM and '100mg'$  QHS (Patient not taking: Reported on 06/17/2021), Disp: 180 tablet, Rfl: 0 ?  famotidine (PEPCID) 20 MG tablet, Take 20 mg by mouth 2 (two) times daily., Disp: , Rfl:  ?  fluticasone (FLONASE) 50 MCG/ACT nasal spray, Place 2 sprays into both nostrils every morning., Disp: , Rfl:  ?  folic acid (FOLVITE) 1 MG tablet, Take 1 mg by mouth every morning., Disp: , Rfl:  ?  gabapentin (NEURONTIN) 300 MG capsule, Take 300 mg by mouth every morning. Take with a 400 mg capsule for a total morning dose of 700 mg, Disp: , Rfl:  ?  gabapentin (NEURONTIN) 400 MG capsule, Take 400 mg by mouth 2 (two) times daily. In the morning take with a 300 mg capsule for a total morning dose of 700 mg, Disp: , Rfl:  ?  haloperidol (HALDOL) 5 MG tablet, Take 2 tablets (10 mg total) by mouth daily with supper. (Patient taking differently: Take 10 mg by mouth every morning.), Disp: 60 tablet, Rfl: 2 ?  levETIRAcetam (KEPPRA) 750 MG tablet, Take 1 tablet (750 mg total) by mouth 2 (two) times daily., Disp: 180 tablet, Rfl: 4 ?  metFORMIN (GLUCOPHAGE) 500 MG tablet, Take 250 mg by mouth 2 (two) times daily., Disp: , Rfl:  ?  omeprazole (PRILOSEC) 20 MG capsule, Take 20 mg by mouth every morning., Disp: , Rfl:  ?  polyethylene glycol (MIRALAX / GLYCOLAX) packet, Take 17 g by mouth daily. (Patient not taking: Reported on 06/17/2021), Disp: 30 each, Rfl: 0 ?  senna (SENOKOT) 8.6 MG tablet, Take 2 tablets by mouth every  morning. Gerikot, Disp: , Rfl:  ?  traZODone (DESYREL) 50 MG tablet, Take 1 tablet (50 mg total) by mouth at bedtime as needed for sleep. (Patient not taking: Reported on 06/17/2021), Disp: 30 tablet, Rfl: 2 ?  valproic acid (DEPAKENE) 250 MG capsule, Take 2 capsules every morning then take 4 capsules at bedtime (Patient taking differently: 500-1,000 mg See admin instructions. Take 2 capsules (500 mg) by mouth every  morning and 4 capsules (1000 mg) at night), Disp: 180 capsule, Rfl: 2 ? ? ?Review of Systems  ?Unable to perform ROS: Dementia  ? ?   ?Objective:  ? Physical Exam ?Constitutional:   ?   Appearance: He is well-developed.  ?HENT:  ?   Head: Normocephalic and atraumatic.  ?Eyes:  ?   Conjunctiva/sclera: Conjunctivae normal.  ?Cardiovascular:  ?   Rate and Rhythm: Normal rate and regular rhythm.  ?Pulmonary:  ?   Effort: Pulmonary effort is normal. No respiratory distress.  ?   Breath sounds: No wheezing.  ?Abdominal:  ?   General: There is no distension.  ?   Palpations: Abdomen is soft.  ?Musculoskeletal:     ?   General: No tenderness. Normal range of motion.  ?   Cervical back: Normal range of motion and neck supple.  ?Skin: ?   General: Skin is warm and dry.  ?   Coloration: Skin is not pale.  ?   Findings: No erythema or rash.  ?Neurological:  ?   General: No focal deficit present.  ?   Mental Status: He is alert and oriented to person, place, and time.  ?Psychiatric:     ?   Attention and Perception: Attention normal.     ?   Mood and Affect: Mood normal.     ?   Speech: Speech is delayed.     ?   Behavior: Behavior is cooperative.     ?   Cognition and Memory: Cognition is impaired.  ? ? ? ? ? ?   ?Assessment & Plan:  ?HIV disease: ? ?We will check a viral load CD4 CBC CMP with GFR ? ?I have sent new prescription of BIKTARVY to the mail order pharmacy which is working with his home. ? ?Cognitive disability: He is currently continue to reside in a group home. ? ?Schizoaffective disorder currently on  medications managed by psychiatry.  5 constipation with defer to PCP. ? ?Thrombocytopenia we will repeat CBC this is been chronic. ? ?Lipidemia continue atorvastatin check LDL today ? ?Vaccine counseling couns

## 2021-08-02 ENCOUNTER — Telehealth (HOSPITAL_COMMUNITY): Payer: Self-pay | Admitting: Specialist

## 2021-08-02 LAB — HELPER T-LYMPH-CD4 (ARMC ONLY)
% CD 4 Pos. Lymph.: 28.8 % — ABNORMAL LOW (ref 30.8–58.5)
Absolute CD 4 Helper: 547 /uL (ref 359–1519)
Basophils Absolute: 0 10*3/uL (ref 0.0–0.2)
Basos: 0 %
EOS (ABSOLUTE): 0.3 10*3/uL (ref 0.0–0.4)
Eos: 4 %
Hematocrit: 37.5 % (ref 37.5–51.0)
Hemoglobin: 12.3 g/dL — ABNORMAL LOW (ref 13.0–17.7)
Immature Grans (Abs): 0 10*3/uL (ref 0.0–0.1)
Immature Granulocytes: 0 %
Lymphocytes Absolute: 1.9 10*3/uL (ref 0.7–3.1)
Lymphs: 27 %
MCH: 29.6 pg (ref 26.6–33.0)
MCHC: 32.8 g/dL (ref 31.5–35.7)
MCV: 90 fL (ref 79–97)
Monocytes Absolute: 0.8 10*3/uL (ref 0.1–0.9)
Monocytes: 11 %
Neutrophils Absolute: 4.1 10*3/uL (ref 1.4–7.0)
Neutrophils: 58 %
Platelets: 120 10*3/uL — ABNORMAL LOW (ref 150–450)
RBC: 4.15 x10E6/uL (ref 4.14–5.80)
RDW: 13.2 % (ref 11.6–15.4)
WBC: 7.1 10*3/uL (ref 3.4–10.8)

## 2021-08-02 LAB — LIPID PANEL
Cholesterol: 126 mg/dL (ref <200)
HDL: 31 mg/dL — ABNORMAL LOW (ref >=40)
LDL Cholesterol (Calc): 80 mg/dL (calc)
Non-HDL Cholesterol (Calc): 95 mg/dL (calc) (ref <130)
Total CHOL/HDL Ratio: 4.1 (calc) (ref <5.0)
Triglycerides: 72 mg/dL (ref <150)

## 2021-08-02 LAB — COMPLETE METABOLIC PANEL WITH GFR
AG Ratio: 1.3 (calc) (ref 1.0–2.5)
ALT: 16 U/L (ref 9–46)
AST: 14 U/L (ref 10–35)
Albumin: 4.3 g/dL (ref 3.6–5.1)
Alkaline phosphatase (APISO): 75 U/L (ref 35–144)
BUN/Creatinine Ratio: 12 (calc) (ref 6–22)
BUN: 21 mg/dL (ref 7–25)
CO2: 29 mmol/L (ref 20–32)
Calcium: 9.4 mg/dL (ref 8.6–10.3)
Chloride: 103 mmol/L (ref 98–110)
Creat: 1.79 mg/dL — ABNORMAL HIGH (ref 0.70–1.35)
Globulin: 3.3 g/dL (calc) (ref 1.9–3.7)
Glucose, Bld: 70 mg/dL (ref 65–99)
Potassium: 4.2 mmol/L (ref 3.5–5.3)
Sodium: 140 mmol/L (ref 135–146)
Total Bilirubin: 0.2 mg/dL (ref 0.2–1.2)
Total Protein: 7.6 g/dL (ref 6.1–8.1)
eGFR: 42 mL/min/{1.73_m2} — ABNORMAL LOW (ref >=60)

## 2021-08-02 LAB — CBC WITH DIFFERENTIAL/PLATELET
Absolute Monocytes: 745 cells/uL (ref 200–950)
Basophils Absolute: 28 cells/uL (ref 0–200)
Basophils Relative: 0.4 %
Eosinophils Absolute: 283 cells/uL (ref 15–500)
Eosinophils Relative: 4.1 %
HCT: 37.3 % — ABNORMAL LOW (ref 38.5–50.0)
Hemoglobin: 12.4 g/dL — ABNORMAL LOW (ref 13.2–17.1)
Lymphs Abs: 1801 cells/uL (ref 850–3900)
MCH: 29.5 pg (ref 27.0–33.0)
MCHC: 33.2 g/dL (ref 32.0–36.0)
MCV: 88.8 fL (ref 80.0–100.0)
MPV: 12.5 fL (ref 7.5–12.5)
Monocytes Relative: 10.8 %
Neutro Abs: 4043 cells/uL (ref 1500–7800)
Neutrophils Relative %: 58.6 %
Platelets: 124 10*3/uL — ABNORMAL LOW (ref 140–400)
RBC: 4.2 10*6/uL (ref 4.20–5.80)
RDW: 13.2 % (ref 11.0–15.0)
Total Lymphocyte: 26.1 %
WBC: 6.9 10*3/uL (ref 3.8–10.8)

## 2021-08-02 LAB — RPR: RPR Ser Ql: NONREACTIVE

## 2021-08-02 LAB — HIV-1 RNA QUANT-NO REFLEX-BLD
HIV 1 RNA Quant: NOT DETECTED copies/mL
HIV-1 RNA Quant, Log: NOT DETECTED Log copies/mL

## 2021-08-02 LAB — LDL CHOLESTEROL, DIRECT: Direct LDL: 77 mg/dL (ref <100)

## 2021-08-02 NOTE — BH Assessment (Signed)
Care Management - Follow Up [place] Discharges  ? ?Writer made contact with the patient's Group Home Supervisor at [phone].   ? ?Patient's supervisor reports that reports that the patient is able to follow up with his established outpatient mental health providers at the [facility].   ?

## 2021-08-03 LAB — T-HELPER CELLS (CD4) COUNT (NOT AT ARMC)

## 2021-08-25 ENCOUNTER — Other Ambulatory Visit: Payer: Self-pay

## 2021-08-25 ENCOUNTER — Encounter (HOSPITAL_COMMUNITY): Payer: Self-pay

## 2021-08-25 ENCOUNTER — Emergency Department (HOSPITAL_COMMUNITY): Payer: 59

## 2021-08-25 ENCOUNTER — Emergency Department (HOSPITAL_COMMUNITY)
Admission: EM | Admit: 2021-08-25 | Discharge: 2021-08-25 | Disposition: A | Discharge status: Home / Self Care | Payer: 59 | Attending: Emergency Medicine | Admitting: Emergency Medicine

## 2021-08-25 DIAGNOSIS — Z7982 Long term (current) use of aspirin: Secondary | ICD-10-CM | POA: Insufficient documentation

## 2021-08-25 DIAGNOSIS — Z21 Asymptomatic human immunodeficiency virus [HIV] infection status: Secondary | ICD-10-CM | POA: Insufficient documentation

## 2021-08-25 DIAGNOSIS — K5900 Constipation, unspecified: Secondary | ICD-10-CM | POA: Insufficient documentation

## 2021-08-25 DIAGNOSIS — R339 Retention of urine, unspecified: Secondary | ICD-10-CM | POA: Diagnosis present

## 2021-08-25 DIAGNOSIS — Z79899 Other long term (current) drug therapy: Secondary | ICD-10-CM | POA: Diagnosis not present

## 2021-08-25 LAB — BASIC METABOLIC PANEL
Anion gap: 6 (ref 5–15)
BUN: 18 mg/dL (ref 8–23)
CO2: 27 mmol/L (ref 22–32)
Calcium: 9.4 mg/dL (ref 8.9–10.3)
Chloride: 109 mmol/L (ref 98–111)
Creatinine, Ser: 1.57 mg/dL — ABNORMAL HIGH (ref 0.61–1.24)
GFR, Estimated: 50 mL/min — ABNORMAL LOW (ref >60)
Glucose, Bld: 130 mg/dL — ABNORMAL HIGH (ref 70–99)
Potassium: 3.9 mmol/L (ref 3.5–5.1)
Sodium: 142 mmol/L (ref 135–145)

## 2021-08-25 LAB — URINALYSIS, ROUTINE W REFLEX MICROSCOPIC
Bilirubin Urine: NEGATIVE
Glucose, UA: NEGATIVE mg/dL
Ketones, ur: NEGATIVE mg/dL
Leukocytes,Ua: NEGATIVE
Nitrite: NEGATIVE
Protein, ur: NEGATIVE mg/dL
Specific Gravity, Urine: 1.013 (ref 1.005–1.030)
pH: 5 (ref 5.0–8.0)

## 2021-08-25 LAB — CBC WITH DIFFERENTIAL/PLATELET
Abs Immature Granulocytes: 0.03 10*3/uL (ref 0.00–0.07)
Basophils Absolute: 0 10*3/uL (ref 0.0–0.1)
Basophils Relative: 1 %
Eosinophils Absolute: 0.2 10*3/uL (ref 0.0–0.5)
Eosinophils Relative: 4 %
HCT: 37.5 % — ABNORMAL LOW (ref 39.0–52.0)
Hemoglobin: 12.3 g/dL — ABNORMAL LOW (ref 13.0–17.0)
Immature Granulocytes: 1 %
Lymphocytes Relative: 25 %
Lymphs Abs: 1.5 10*3/uL (ref 0.7–4.0)
MCH: 29.9 pg (ref 26.0–34.0)
MCHC: 32.8 g/dL (ref 30.0–36.0)
MCV: 91.2 fL (ref 80.0–100.0)
Monocytes Absolute: 0.6 10*3/uL (ref 0.1–1.0)
Monocytes Relative: 9 %
Neutro Abs: 3.7 10*3/uL (ref 1.7–7.7)
Neutrophils Relative %: 60 %
Platelets: 120 10*3/uL — ABNORMAL LOW (ref 150–400)
RBC: 4.11 MIL/uL — ABNORMAL LOW (ref 4.22–5.81)
RDW: 14.1 % (ref 11.5–15.5)
WBC: 6.1 10*3/uL (ref 4.0–10.5)
nRBC: 0 % (ref 0.0–0.2)

## 2021-08-25 MED ORDER — SORBITOL 70 % SOLN
960.0000 mL | TOPICAL_OIL | Freq: Once | ORAL | Status: DC
  Filled 2021-08-25: qty 473

## 2021-08-25 MED ORDER — POLYETHYLENE GLYCOL 3350 17 G PO PACK: 17.0000 g | PACK | Freq: Every day | ORAL | 0 refills | Status: AC

## 2021-08-25 MED ORDER — FLEET ENEMA 7-19 GM/118ML RE ENEM: 1.0000 | ENEMA | Freq: Once | RECTAL | Status: DC

## 2021-08-25 NOTE — Discharge Instructions (Addendum)
Give a capful of MiraLAX daily and titrate up and down until patient is having regular bowel movements daily.  Work-up today was reassuring, there is no evidence of underlying urinary tract infection.  Her chronic use and amount of fiber he eats and making sure he is having regular bowel movements as the constipation is likely what is causing his urinary symptoms.

## 2021-08-25 NOTE — ED Triage Notes (Signed)
Pt BIB EMS from group home. Patient states he has been unable to urinate for 2 days. He states he has the urgency to go. Hx of UTI 2 months ago. Patient is HIV positive.

## 2021-08-25 NOTE — ED Notes (Signed)
Bladder scan showed volume of 178 mL.

## 2021-08-25 NOTE — ED Notes (Signed)
PA informed RN she spoke with the guardian.

## 2021-08-25 NOTE — ED Provider Notes (Signed)
[facility] DEPT Provider Note   CSN: [id number] Arrival date & time: 08/25/21  1044     History  No chief complaint on file.   Nathan [name] is a 61 y.o. male.  HPI  Patient with history of intellectual delay, seizure disorder on Keppra, HIV presents today due to dysuria/urinary retention.  History is primarily provided by Cheri his legal guardian and Rodena Piety his care home caretaker.  She states patient 1 month ago had an issue with dysuria, start on antibiotics and seemed to improve.  She was unaware of patient having an issue, Patient states he last urinated this morning but had pain with it.  Will 5 caveat applies secondary to patient's mental status.  Rodena Piety, group home caretaker, states not able to pass urine this morning. No pain. Eating normally, drinking.   Home Medications Prior to Admission medications   Medication Sig Start Date End Date Taking? Authorizing Provider  polyethylene glycol (MIRALAX) 17 g packet Take 17 g by mouth daily. 08/25/21  Yes Sherrill Raring, PA-C  acetaminophen (TYLENOL) 500 MG tablet Take 1 tablet (500 mg total) by mouth every 8 (eight) hours as needed for moderate pain. 06/21/19   Loura Halt A, NP  aspirin EC 81 MG tablet Take 81 mg by mouth every morning. Swallow whole.    [provider]  atorvastatin (LIPITOR) 20 MG tablet Take 20 mg by mouth at bedtime.    [provider]  benztropine (COGENTIN) 2 MG tablet Take 1 tablet (2 mg total) by mouth 2 (two) times daily. 10/31/20   Kathie Dike, MD  bictegravir-emtricitabine-tenofovir AF (BIKTARVY) 50-200-25 MG TABS tablet Take 1 tablet by mouth daily. 07/31/21   Truman Hayward, MD  cetirizine (ZYRTEC) 10 MG tablet TAKE 1 TABLET ONCE DAILY. Patient taking differently: Take 10 mg by mouth every morning. 01/21/18   Forrest Moron, MD  cloZAPine (CLOZARIL) 100 MG tablet Take 100 mg by mouth at bedtime. 06/05/21   [provider]  clozapine  (CLOZARIL) 200 MG tablet Take 200 mg by mouth every morning. 06/05/21   [provider]  cloZAPine (CLOZARIL) 50 MG tablet 7/26 '75mg'$  QHS, 7/27 '25mg'$  AM and '100mg'$  QHS, 7/28 '75mg'$  AM and '100mg'$  QHS, 7/29 '125mg'$  AM and '100mg'$  QHS, 7/30 175 mg AM and '100mg'$  QHS, 7/31 '200mg'$  AM and '100mg'$  QHS 10/31/20   Kathie Dike, MD  famotidine (PEPCID) 20 MG tablet Take 20 mg by mouth 2 (two) times daily. 06/10/19   [provider]  fluticasone Asencion Islam) 50 MCG/ACT nasal spray Place 2 sprays into both nostrils every morning. 09/25/20   [provider]  folic acid (FOLVITE) 1 MG tablet Take 1 mg by mouth every morning.    [provider]  gabapentin (NEURONTIN) 300 MG capsule Take 300 mg by mouth every morning. Take with a 400 mg capsule for a total morning dose of 700 mg 06/10/19   [provider]  gabapentin (NEURONTIN) 400 MG capsule Take 400 mg by mouth 2 (two) times daily. In the morning take with a 300 mg capsule for a total morning dose of 700 mg    [provider]  haloperidol (HALDOL) 5 MG tablet Take 2 tablets (10 mg total) by mouth daily with supper. Patient taking differently: Take 10 mg by mouth every morning. 10/31/20   Kathie Dike, MD  levETIRAcetam (KEPPRA) 750 MG tablet Take 1 tablet (750 mg total) by mouth 2 (two) times daily. 10/31/20   Kathie Dike, MD  metFORMIN (GLUCOPHAGE) 500 MG tablet Take 250 mg by mouth 2 (two) times daily. 06/10/19   [provider]  omeprazole (PRILOSEC) 20 MG capsule Take 20 mg by mouth every morning. 06/09/21   [provider]  senna (SENOKOT) 8.6 MG tablet Take 2 tablets by mouth every morning. Gerikot    [provider]  traZODone (DESYREL) 50 MG tablet Take 1 tablet (50 mg total) by mouth at bedtime as needed for sleep. 10/25/20   Nwoko, Terese Door, PA  valproic acid (DEPAKENE) 250 MG capsule Take 2 capsules every morning then take 4 capsules at bedtime Patient taking differently: 500-1,000 mg See  admin instructions. Take 2 capsules (500 mg) by mouth every morning and 4 capsules (1000 mg) at night 10/31/20   Kathie Dike, MD      Allergies    Patient has no known allergies.    Review of Systems   Review of Systems  Physical Exam Updated Vital Signs BP (!) 144/87   Pulse 93   Temp 98.1 F (36.7 C) (Oral)   Resp 18   Ht '5\' 9"'$  (1.753 m)   Wt 84 kg   SpO2 99%   BMI 27.35 kg/m  Physical Exam Vitals and nursing note reviewed. Exam conducted with a chaperone present.  Constitutional:      Appearance: Normal appearance.  HENT:     Head: Normocephalic and atraumatic.  Eyes:     General: No scleral icterus.       Right eye: No discharge.        Left eye: No discharge.     Extraocular Movements: Extraocular movements intact.     Pupils: Pupils are equal, round, and reactive to light.  Cardiovascular:     Rate and Rhythm: Normal rate and regular rhythm.     Pulses: Normal pulses.     Heart sounds: Normal heart sounds. No murmur heard.   No friction rub. No gallop.  Pulmonary:     Effort: Pulmonary effort is normal. No respiratory distress.     Breath sounds: Normal breath sounds.  Abdominal:     General: Abdomen is flat. Bowel sounds are normal. There is no distension.     Palpations: Abdomen is soft.     Tenderness: There is no abdominal tenderness.     Comments: Abdomen is soft nontender  Skin:    General: Skin is warm and dry.     Coloration: Skin is not jaundiced.  Neurological:     Mental Status: He is alert. Mental status is at baseline.     Coordination: Coordination normal.    ED Results / Procedures / Treatments   Labs (all labs ordered are listed, but only abnormal results are displayed) Labs Reviewed  CBC WITH DIFFERENTIAL/PLATELET - Abnormal; Notable for the following components:      Result Value   RBC 4.11 (*)    Hemoglobin 12.3 (*)    HCT 37.5 (*)    Platelets 120 (*)    All other components within normal limits  BASIC METABOLIC PANEL -  Abnormal; Notable for the following components:   Glucose, Bld 130 (*)    Creatinine, Ser 1.57 (*)    GFR, Estimated 50 (*)    All other components within normal limits  URINALYSIS, ROUTINE W REFLEX MICROSCOPIC - Abnormal; Notable for the following components:   Hgb urine dipstick SMALL (*)    Bacteria, UA RARE (*)    All other components within normal limits  URINE CULTURE  EKG None  Radiology CT Renal Stone Study  Result Date: 08/25/2021 CLINICAL DATA:  Flank pain.  Urinary retention. EXAM: CT ABDOMEN AND PELVIS WITHOUT CONTRAST TECHNIQUE: Multidetector CT imaging of the abdomen and pelvis was performed following the standard protocol without IV contrast. RADIATION DOSE REDUCTION: This exam was performed according to the departmental dose-optimization program which includes automated exposure control, adjustment of the mA and/or kV according to patient size and/or use of iterative reconstruction technique. COMPARISON:  02/18/2018 FINDINGS: Lower chest: No acute findings. Hepatobiliary: No mass visualized on this unenhanced exam. Gallbladder is unremarkable. No evidence of biliary ductal dilatation. Pancreas: No mass or inflammatory process visualized on this unenhanced exam. Spleen:  Within normal limits in size. Adrenals/Urinary tract: No evidence of urolithiasis or hydronephrosis. Unremarkable unopacified urinary bladder. Stomach/Bowel: No evidence of obstruction, inflammatory process, or abnormal fluid collections. Normal appendix visualized. Large stool burden noted throughout the colon. Vascular/Lymphatic: No pathologically enlarged lymph nodes identified. No evidence of abdominal aortic aneurysm. Aortic atherosclerotic calcification incidentally noted. Reproductive:  No mass or other significant abnormality. Other:  None. Musculoskeletal: No suspicious bone lesions identified. Bilateral L5 pars defects again noted, with degenerative disc disease and grade 1 anterolisthesis at L5-S1  measuring approximately 4 mm which is not significantly changed. IMPRESSION: No evidence of urolithiasis, hydronephrosis, or other acute findings. Large stool burden noted; recommend clinical correlation for possible constipation. Electronically Signed   By: Marlaine Hind M.D.   On: 08/25/2021 11:31    Procedures Procedures    Medications Ordered in ED Medications - No data to display   ED Course/ Medical Decision Making/ A&P                           Medical Decision Making Amount and/or Complexity of Data Reviewed Labs: ordered. Radiology: ordered.   Patient presents due to urinary retention.  Frontal diagnosis includes but not limited to uropathy, UTI, nephrolithiasis, constipation, AKI.  Patient's care is complicated by his intellectual disabilities.  Independent story is included needed at the home as well as Cheri his legal guardian.  On exam patient is well-appearing.  His vitals are stable and he is not febrile or meeting SIRS criteria.  Abdomen is soft and nondistended.  No CVA tenderness.  I ordered laboratory work-up.  I viewed the labs and per my interpretation results are as followed: No leukocytosis or anemia.  Patient's creatinine is roughly at baseline at 1.57, per chart review 1.79 most recently. UA notable for trace hematuria but no leukocytes or nitrites.  Doubt UTI, will not order any antibiotics at this time.  urine culture obtained and is pending.  I ordered a CT renal study.  I viewed the image and agree with radiologist interpretation of no hydronephrosis, nephrolithiasis or obstructive uropathy noted.  There was a heavy stool load in seen on the exam. There were no signs of bowel obstruction on the CT renal study.  On reevaluation patient is well.  Cath performed, patient passed well.  Bladder scan showed 178, postvoid residual was 200.  I suspect the urinary retention is more secondary to constipation.    I reached out again to [place], his legal guardian.  She  states patient had a history of IBS, was previously on MiraLAX and have him on it for some time per her knowledge.  I sent the patient home with MiraLAX and bowel regiment, nothing any additional work-up is needed at this specific time.  Encourage patient to  follow-up with his GI physician as needed as well as to increase fiber intake MiraLAX daily until patient is having regular bowel movements.        Final Clinical Impression(s) / ED Diagnoses Final diagnoses:  Constipation, unspecified constipation type    Rx / DC Orders ED Discharge Orders          Ordered    polyethylene glycol (MIRALAX) 17 g packet  Daily        08/25/21 1411              Sherrill Raring, PA-C 08/25/21 1432    Davonna Belling, MD 08/25/21 [phone]

## 2021-08-25 NOTE — ED Notes (Signed)
Guardian will pick the patient up around 1630.

## 2021-08-26 LAB — URINE CULTURE: Culture: NO GROWTH

## 2021-10-31 ENCOUNTER — Emergency Department (HOSPITAL_COMMUNITY): Payer: 59

## 2021-10-31 ENCOUNTER — Emergency Department (HOSPITAL_COMMUNITY)
Admission: EM | Admit: 2021-10-31 | Discharge: 2021-11-01 | Disposition: A | Discharge status: Home / Self Care | Payer: 59 | Attending: Emergency Medicine | Admitting: Emergency Medicine

## 2021-10-31 DIAGNOSIS — Z7982 Long term (current) use of aspirin: Secondary | ICD-10-CM | POA: Insufficient documentation

## 2021-10-31 DIAGNOSIS — K5900 Constipation, unspecified: Secondary | ICD-10-CM

## 2021-10-31 DIAGNOSIS — R7989 Other specified abnormal findings of blood chemistry: Secondary | ICD-10-CM | POA: Diagnosis not present

## 2021-10-31 DIAGNOSIS — R1033 Periumbilical pain: Secondary | ICD-10-CM | POA: Diagnosis present

## 2021-10-31 DIAGNOSIS — Z21 Asymptomatic human immunodeficiency virus [HIV] infection status: Secondary | ICD-10-CM | POA: Insufficient documentation

## 2021-10-31 DIAGNOSIS — R109 Unspecified abdominal pain: Secondary | ICD-10-CM

## 2021-10-31 LAB — COMPREHENSIVE METABOLIC PANEL
ALT: 12 U/L (ref 0–44)
AST: 14 U/L — ABNORMAL LOW (ref 15–41)
Albumin: 3.4 g/dL — ABNORMAL LOW (ref 3.5–5.0)
Alkaline Phosphatase: 63 U/L (ref 38–126)
Anion gap: 8 (ref 5–15)
BUN: 16 mg/dL (ref 8–23)
CO2: 27 mmol/L (ref 22–32)
Calcium: 9.1 mg/dL (ref 8.9–10.3)
Chloride: 103 mmol/L (ref 98–111)
Creatinine, Ser: 1.65 mg/dL — ABNORMAL HIGH (ref 0.61–1.24)
GFR, Estimated: 46 mL/min — ABNORMAL LOW (ref >60)
Glucose, Bld: 204 mg/dL — ABNORMAL HIGH (ref 70–99)
Potassium: 4 mmol/L (ref 3.5–5.1)
Sodium: 138 mmol/L (ref 135–145)
Total Bilirubin: 0.7 mg/dL (ref 0.3–1.2)
Total Protein: 7.7 g/dL (ref 6.5–8.1)

## 2021-10-31 LAB — CBC WITH DIFFERENTIAL/PLATELET
Abs Immature Granulocytes: 0.08 10*3/uL — ABNORMAL HIGH (ref 0.00–0.07)
Basophils Absolute: 0.1 10*3/uL (ref 0.0–0.1)
Basophils Relative: 1 %
Eosinophils Absolute: 0.2 10*3/uL (ref 0.0–0.5)
Eosinophils Relative: 3 %
HCT: 35.2 % — ABNORMAL LOW (ref 39.0–52.0)
Hemoglobin: 11.5 g/dL — ABNORMAL LOW (ref 13.0–17.0)
Immature Granulocytes: 1 %
Lymphocytes Relative: 21 %
Lymphs Abs: 1.9 10*3/uL (ref 0.7–4.0)
MCH: 29.4 pg (ref 26.0–34.0)
MCHC: 32.7 g/dL (ref 30.0–36.0)
MCV: 90 fL (ref 80.0–100.0)
Monocytes Absolute: 1.1 10*3/uL — ABNORMAL HIGH (ref 0.1–1.0)
Monocytes Relative: 12 %
Neutro Abs: 5.8 10*3/uL (ref 1.7–7.7)
Neutrophils Relative %: 62 %
Platelets: 176 10*3/uL (ref 150–400)
RBC: 3.91 MIL/uL — ABNORMAL LOW (ref 4.22–5.81)
RDW: 13.8 % (ref 11.5–15.5)
WBC: 9.2 10*3/uL (ref 4.0–10.5)
nRBC: 0 % (ref 0.0–0.2)

## 2021-10-31 LAB — URINALYSIS, ROUTINE W REFLEX MICROSCOPIC
Bilirubin Urine: NEGATIVE
Glucose, UA: 50 mg/dL — AB
Ketones, ur: NEGATIVE mg/dL
Nitrite: NEGATIVE
Protein, ur: NEGATIVE mg/dL
Specific Gravity, Urine: 1.009 (ref 1.005–1.030)
pH: 6 (ref 5.0–8.0)

## 2021-10-31 LAB — LIPASE, BLOOD: Lipase: 29 U/L (ref 11–51)

## 2021-10-31 MED ORDER — SODIUM CHLORIDE 0.9 % IV BOLUS
500.0000 mL | Freq: Once | INTRAVENOUS | Status: AC
  Administered 2021-10-31: 500 mL via INTRAVENOUS

## 2021-10-31 NOTE — ED Provider Notes (Signed)
[facility] EMERGENCY DEPARTMENT Provider Note   CSN: [id number] Arrival date & time: 10/31/21  2042     History  No chief complaint on file.   Nathan [name] is a 61 y.o. male.  Patient as above with significant medical history as below, including nursing facility resident, HIV, schizoaffective disorder, schizophrenia, MR, bipolar 1 disorder, anemia, anxiety who presents to the ED with complaint of abdominal pain.  [facility].  On my assessment patient has no acute complaints at this time.  Reports he has no abdominal pain.  He does comment that yesterday he had a brief episode of cramping sensation to his periumbilical area but otherwise he has no acute complaints.  No nausea or vomiting.  No fevers or chills.  No change to bowel or bladder function.  No falls.  Reports that he is eating and drinking normally.  Reports that he has good appetite.  No chest pain or dyspnea.  No rashes.  No other complaints offered.   No vomiting, last bm yestd  Past Medical History:  Diagnosis Date   Anemia    Anemia    Anxiety    Bipolar 1 disorder (HCC)    Depression, major    HIV (human immunodeficiency virus infection) ([place])    HIV disease ([place]) 07/31/2021   Hyperglycemia 08/06/2017   Hypernatremia    Kidney failure    Lithium toxicity    MR (mental retardation)    Schizoaffective disorder ([place]) 08/06/2017   Schizophrenia ([place])    Seizures ([place])     Past Surgical History:  Procedure Laterality Date   BIOPSY N/A 04/12/2014   Procedure: BIOPSY;  Surgeon: Danie Binder, MD;  Location: [facility];  Service: Endoscopy;  Laterality: N/A;  Gastric   COLONOSCOPY WITH PROPOFOL N/A 04/12/2014   SLF:  1. one colon polyp removed 2. The left colon is extremely redundant 3. Small internal hemorrhoids   ESOPHAGOGASTRODUODENOSCOPY (EGD) WITH PROPOFOL N/A 04/12/2014   SLF: Stricture at the gastroesophageal junction 2. small polyp  in teh gastric body 3. Mild Non-erosive gastritis.     NOSE SURGERY  1968   unsure what type of surgery. something to help him breathe better   POLYPECTOMY N/A 04/12/2014   Procedure: POLYPECTOMY;  Surgeon: Danie Binder, MD;  Location: [facility];  Service: Endoscopy;  Laterality: N/A;  Cecal   SAVORY DILATION N/A 04/12/2014   Procedure: SAVORY DILATION;  Surgeon: Danie Binder, MD;  Location: [facility];  Service: Endoscopy;  Laterality: N/A;  12.8/ 14/15/16   SKIN BIOPSY       The history is provided by the patient. The history is limited by the condition of the patient. No language interpreter was used.       Home Medications Prior to Admission medications   Medication Sig Start Date End Date Taking? Authorizing Provider  acetaminophen (TYLENOL) 500 MG tablet Take 1 tablet (500 mg total) by mouth every 8 (eight) hours as needed for moderate pain. 06/21/19   Loura Halt A, NP  aspirin EC 81 MG tablet Take 81 mg by mouth every morning. Swallow whole.    [provider]  atorvastatin (LIPITOR) 20 MG tablet Take 20 mg by mouth at bedtime.    [provider]  benztropine (COGENTIN) 2 MG tablet Take 1 tablet (2 mg total) by mouth 2 (two) times daily. 10/31/20   Kathie Dike, MD  bictegravir-emtricitabine-tenofovir AF (BIKTARVY) 50-200-25 MG TABS tablet Take 1 tablet by mouth daily.  07/31/21   Truman Hayward, MD  cetirizine (ZYRTEC) 10 MG tablet TAKE 1 TABLET ONCE DAILY. Patient taking differently: Take 10 mg by mouth every morning. 01/21/18   Forrest Moron, MD  cloZAPine (CLOZARIL) 100 MG tablet Take 100 mg by mouth at bedtime. 06/05/21   [provider]  clozapine (CLOZARIL) 200 MG tablet Take 200 mg by mouth every morning. 06/05/21   [provider]  cloZAPine (CLOZARIL) 50 MG tablet 7/26 '75mg'$  QHS, 7/27 '25mg'$  AM and '100mg'$  QHS, 7/28 '75mg'$  AM and '100mg'$  QHS, 7/29 '125mg'$  AM and '100mg'$  QHS, 7/30 175 mg AM and '100mg'$  QHS, 7/31 '200mg'$  AM and '100mg'$  QHS 10/31/20   Kathie Dike, MD  famotidine (PEPCID) 20 MG tablet Take  20 mg by mouth 2 (two) times daily. 06/10/19   [provider]  fluticasone Asencion Islam) 50 MCG/ACT nasal spray Place 2 sprays into both nostrils every morning. 09/25/20   [provider]  folic acid (FOLVITE) 1 MG tablet Take 1 mg by mouth every morning.    [provider]  gabapentin (NEURONTIN) 300 MG capsule Take 300 mg by mouth every morning. Take with a 400 mg capsule for a total morning dose of 700 mg 06/10/19   [provider]  gabapentin (NEURONTIN) 400 MG capsule Take 400 mg by mouth 2 (two) times daily. In the morning take with a 300 mg capsule for a total morning dose of 700 mg    [provider]  haloperidol (HALDOL) 5 MG tablet Take 2 tablets (10 mg total) by mouth daily with supper. Patient taking differently: Take 10 mg by mouth every morning. 10/31/20   Kathie Dike, MD  levETIRAcetam (KEPPRA) 750 MG tablet Take 1 tablet (750 mg total) by mouth 2 (two) times daily. 10/31/20   Kathie Dike, MD  metFORMIN (GLUCOPHAGE) 500 MG tablet Take 250 mg by mouth 2 (two) times daily. 06/10/19   [provider]  omeprazole (PRILOSEC) 20 MG capsule Take 20 mg by mouth every morning. 06/09/21   [provider]  polyethylene glycol (MIRALAX) 17 g packet Take 17 g by mouth daily. 08/25/21   Sherrill Raring, PA-C  senna (SENOKOT) 8.6 MG tablet Take 2 tablets by mouth every morning. Gerikot    [provider]  traZODone (DESYREL) 50 MG tablet Take 1 tablet (50 mg total) by mouth at bedtime as needed for sleep. 10/25/20   Nwoko, Terese Door, PA  valproic acid (DEPAKENE) 250 MG capsule Take 2 capsules every morning then take 4 capsules at bedtime Patient taking differently: 500-1,000 mg See admin instructions. Take 2 capsules (500 mg) by mouth every morning and 4 capsules (1000 mg) at night 10/31/20   Kathie Dike, MD      Allergies    Patient has no known allergies.    Review of Systems   Review of Systems  Constitutional:  Negative for  chills and fever.  HENT:  Negative for facial swelling and trouble swallowing.   Eyes:  Negative for photophobia and visual disturbance.  Respiratory:  Negative for cough and shortness of breath.   Cardiovascular:  Negative for chest pain and palpitations.  Gastrointestinal:  Positive for abdominal pain. Negative for nausea and vomiting.  Endocrine: Negative for polydipsia and polyuria.  Genitourinary:  Negative for difficulty urinating and hematuria.  Musculoskeletal:  Negative for gait problem and joint swelling.  Skin:  Negative for pallor and rash.  Neurological:  Negative for syncope and headaches.  Psychiatric/Behavioral:  Negative for agitation and  confusion.     Physical Exam Updated Vital Signs BP 108/71   Pulse 67   Resp 18   SpO2 96%  Physical Exam Vitals and nursing note reviewed.  Constitutional:      General: He is not in acute distress.    Appearance: Normal appearance. He is well-developed. He is not ill-appearing.  HENT:     Head: Normocephalic and atraumatic.     Jaw: There is normal jaw occlusion.     Comments: Poor dentition    Right Ear: External ear normal.     Left Ear: External ear normal.     Mouth/Throat:     Mouth: Mucous membranes are moist.  Eyes:     General: No scleral icterus. Cardiovascular:     Rate and Rhythm: Normal rate and regular rhythm.     Pulses: Normal pulses.     Heart sounds: Normal heart sounds.  Pulmonary:     Effort: Pulmonary effort is normal. No tachypnea or respiratory distress.     Breath sounds: Normal breath sounds.  Abdominal:     General: Abdomen is flat. There is no distension.     Palpations: Abdomen is soft. There is no pulsatile mass.     Tenderness: There is no abdominal tenderness. There is no guarding or rebound. Negative signs include Murphy's sign.  Musculoskeletal:        General: Normal range of motion.     Cervical back: Full passive range of motion without pain and normal range of motion.     Right  lower leg: No edema.     Left lower leg: No edema.  Skin:    General: Skin is warm and dry.     Capillary Refill: Capillary refill takes less than 2 seconds.  Neurological:     Mental Status: He is alert and oriented to person, place, and time.  Psychiatric:        Mood and Affect: Mood normal.        Behavior: Behavior normal.     ED Results / Procedures / Treatments   Labs (all labs ordered are listed, but only abnormal results are displayed) Labs Reviewed  CBC WITH DIFFERENTIAL/PLATELET - Abnormal; Notable for the following components:      Result Value   RBC 3.91 (*)    Hemoglobin 11.5 (*)    HCT 35.2 (*)    Monocytes Absolute 1.1 (*)    Abs Immature Granulocytes 0.08 (*)    All other components within normal limits  COMPREHENSIVE METABOLIC PANEL - Abnormal; Notable for the following components:   Glucose, Bld 204 (*)    Creatinine, Ser 1.65 (*)    Albumin 3.4 (*)    AST 14 (*)    GFR, Estimated 46 (*)    All other components within normal limits  URINALYSIS, ROUTINE W REFLEX MICROSCOPIC - Abnormal; Notable for the following components:   APPearance HAZY (*)    Glucose, UA 50 (*)    Hgb urine dipstick MODERATE (*)    Leukocytes,Ua MODERATE (*)    Bacteria, UA FEW (*)    All other components within normal limits  LIPASE, BLOOD    EKG None  Radiology No results found.  Procedures Procedures    Medications Ordered in ED Medications  sodium chloride 0.9 % bolus 500 mL (500 mLs Intravenous New Bag/Given 10/31/21 2338)  iohexol (OMNIPAQUE) 300 MG/ML solution 100 mL (80 mLs Intravenous Contrast Given 11/01/21 0012)    ED Course/ Medical Decision Making/  A&P Clinical Course as of 11/01/21 0015  Wed Oct 31, 2021  2359 Creatinine(!): 1.65 Similar to his baseline  [SG]  2359  UA with blood, only 6-10 wbc's [SG]    Clinical Course User Index [SG] Jeanell Sparrow, DO                           Medical Decision Making Amount and/or Complexity of Data  Reviewed Labs: ordered. Decision-making details documented in ED Course. Radiology: ordered.  Risk Prescription drug management.    CC: low abd pain  This patient presents to the Emergency Department for the above complaint. This involves an extensive number of treatment options and is a complaint that carries with it a high risk of complications and morbidity. Vital signs were reviewed. Serious etiologies considered.  Differential diagnosis includes but is not exclusive to acute appendicitis, renal colic, testicular torsion, urinary tract infection, prostatitis,  diverticulitis, small bowel obstruction, colitis, abdominal aortic aneurysm, gastroenteritis, constipation etc.  Record review:  Previous records obtained and reviewed prior ED visits, prior labs and imaging > Recent ED visit secondary to constipation  Additional history obtained from n/a  Medical and surgical history as noted above.   Work up as above, notable for:  Labs & imaging results that were available during my care of the patient were visualized by me and considered in my medical decision making.  Physical exam as above.   I ordered imaging studies which included CT a/p. I visualized the imaging, interpreted images; official interpretation pending at time of shift change, wet read shows constipation- signed out to incoming edp pending radiology read; he has hx chronic constipation. No evidence of bowel obstruction on wet read; will defer to rads.   Cardiac monitoring reviewed and interpreted personally which shows NSR  Labs reviewed; hgb similar to his baseline, Cr similar to baseline. UA not consistent w/ infection  Management: IVF  ED Course: Clinical Course as of 11/01/21 0015  Wed Oct 31, 2021  2359 Creatinine(!): 1.65 Similar to his baseline  [SG]  2359  UA with blood, only 6-10 wbc's [SG]    Clinical Course User Index [SG] Wynona Dove A, DO     Reassessment:  Pt remains asymptomatic. HDS.    Admission was considered.                Social determinants of health include -  Social History   Socioeconomic History   Marital status: Single    Spouse name: Not on file   Number of children: 0   Years of education: 9th grade   Highest education level: Not on file  Occupational History   Occupation: Disabled  Tobacco Use   Smoking status: Never   Smokeless tobacco: Never  Vaping Use   Vaping Use: Never used  Substance and Sexual Activity   Alcohol use: No    Alcohol/week: 0.0 standard drinks of alcohol   Drug use: No   Sexual activity: Never    Birth control/protection: Abstinence    Comment: DECLINED CONDOMS  Other Topics Concern   Not on file  Social History Narrative   Lives in group home.   Right-handed.   2-3 sodas per day.   Social Determinants of Health   Financial Resource Strain: Not on file  Food Insecurity: Not on file  Transportation Needs: Not on file  Physical Activity: Not on file  Stress: Not on file  Social Connections: Not on file  Intimate Partner Violence: Not on file      This chart was dictated using Armed forces training and education officer.  Despite best efforts to proofread,  errors can occur which can change the documentation meaning.         Final Clinical Impression(s) / ED Diagnoses Final diagnoses:  Abdominal pain, unspecified abdominal location    Rx / DC Orders ED Discharge Orders     None         Jeanell Sparrow, DO 11/03/21 1509

## 2021-10-31 NOTE — ED Triage Notes (Signed)
Pt BIB [facility] from [facility]  Instructions are to call Anetha at [phone] when discharged.   PT brought in for Abd pain x2 days described as sharp and primarily in the lower quadrants per EMS.  Last BM was yesterday.    Per EMS has hx of Schizophrenia, DM, Anemia, GERD, and Seizures.  EMS VS 134/80 97% HR 74 RR 16 CBG 209

## 2021-10-31 NOTE — Discharge Instructions (Signed)
It was a pleasure caring for you today in the emergency department. ° °Please return to the emergency department for any worsening or worrisome symptoms. ° ° °

## 2021-11-01 DIAGNOSIS — R1033 Periumbilical pain: Secondary | ICD-10-CM | POA: Diagnosis not present

## 2021-11-01 MED ORDER — IOHEXOL 300 MG/ML  SOLN
100.0000 mL | Freq: Once | INTRAMUSCULAR | Status: AC | PRN
  Administered 2021-11-01: 80 mL via INTRAVENOUS

## 2021-11-01 MED ORDER — POLYETHYLENE GLYCOL 3350 17 GM/SCOOP PO POWD: 1.0000 | Freq: Once | ORAL | 0 refills | Status: AC

## 2021-11-01 NOTE — ED Notes (Addendum)
2nd message left at name and number provided in triage note.

## 2021-11-01 NOTE — ED Provider Notes (Signed)
Patient signed out pending CT read.  Presented with reported abdominal pain.  Did not have any complaints or significant pain.  Does have a history of constipation.  CT shows moderate stool burden.  No other acute abnormalities.  Discharge per Dr. Earl Lites instructions with [facility].   Merryl Hacker, MD 11/01/21 [phone]

## 2021-11-01 NOTE — ED Notes (Signed)
Call family to inquire for their ETA they are on the way and should be here in the next 15 mins she stated

## 2021-11-26 ENCOUNTER — Ambulatory Visit: Payer: 59 | Admitting: Podiatry

## 2021-12-07 ENCOUNTER — Ambulatory Visit: Payer: 59 | Admitting: Podiatry

## 2022-02-18 ENCOUNTER — Encounter: Payer: Self-pay | Admitting: Infectious Disease

## 2022-02-18 ENCOUNTER — Other Ambulatory Visit: Payer: Self-pay

## 2022-02-18 ENCOUNTER — Ambulatory Visit (INDEPENDENT_AMBULATORY_CARE_PROVIDER_SITE_OTHER): Payer: 59 | Admitting: Infectious Disease

## 2022-02-18 VITALS — BP 132/86 | HR 96 | Temp 97.8°F | Wt 173.0 lb

## 2022-02-18 DIAGNOSIS — F259 Schizoaffective disorder, unspecified: Secondary | ICD-10-CM | POA: Diagnosis not present

## 2022-02-18 DIAGNOSIS — B20 Human immunodeficiency virus [HIV] disease: Secondary | ICD-10-CM

## 2022-02-18 MED ORDER — ATORVASTATIN CALCIUM 20 MG PO TABS: 20.0000 mg | ORAL_TABLET | Freq: Every day | ORAL | 11 refills | Status: DC

## 2022-02-18 MED ORDER — BIKTARVY 50-200-25 MG PO TABS: 1.0000 | ORAL_TABLET | Freq: Every day | ORAL | 11 refills | Status: DC

## 2022-02-18 NOTE — Progress Notes (Signed)
Subjective:  Chief complaint: Here to follow-up for his HIV disease on Biktarvy Patient ID: Nathan [name], male    DOB: Oct 21, 1960, 61 y.o.   MRN: [id number]  HPI  Nathan [name] is a 61 year old black man with multiple medical problems including HIV that has been well controlled cognitive disability with aggressive behavior at times bipolar schizoaffective disorder.  His viral load has been nicely controlled on Biktarvy.  Continues to live in a group home.  He was brought to clinic today and when I entered the room he did not know why he was in the clinic.  He could not really give me much information frankly at all.  Hopefully he is still taking his medications religiously and suppressed.      Past Medical History:  Diagnosis Date   Anemia    Anemia    Anxiety    Bipolar 1 disorder (HCC)    Depression, major    HIV (human immunodeficiency virus infection) ([place])    HIV disease ([place]) 07/31/2021   Hyperglycemia 08/06/2017   Hypernatremia    Kidney failure    Lithium toxicity    MR (mental retardation)    Schizoaffective disorder ([place]) 08/06/2017   Schizophrenia ([place])    Seizures ([place])     Past Surgical History:  Procedure Laterality Date   BIOPSY N/A 04/12/2014   Procedure: BIOPSY;  Surgeon: Danie Binder, MD;  Location: [facility];  Service: Endoscopy;  Laterality: N/A;  Gastric   COLONOSCOPY WITH PROPOFOL N/A 04/12/2014   SLF:  1. one colon polyp removed 2. The left colon is extremely redundant 3. Small internal hemorrhoids   ESOPHAGOGASTRODUODENOSCOPY (EGD) WITH PROPOFOL N/A 04/12/2014   SLF: Stricture at the gastroesophageal junction 2. small polyp  in teh gastric body 3. Mild Non-erosive gastritis.    NOSE SURGERY  1968   unsure what type of surgery. something to help him breathe better   POLYPECTOMY N/A 04/12/2014   Procedure: POLYPECTOMY;  Surgeon: Danie Binder, MD;  Location: [facility];  Service: Endoscopy;  Laterality: N/A;  Cecal   SAVORY DILATION N/A 04/12/2014   Procedure:  SAVORY DILATION;  Surgeon: Danie Binder, MD;  Location: [facility];  Service: Endoscopy;  Laterality: N/A;  12.8/ 14/15/16   SKIN BIOPSY      Family History  Problem Relation Age of Onset   Other Mother        unknown medical history   Other Father        unknown medical history   Colon cancer Neg Hx       Social History   Socioeconomic History   Marital status: Single    Spouse name: Not on file   Number of children: 0   Years of education: 9th grade   Highest education level: Not on file  Occupational History   Occupation: Disabled  Tobacco Use   Smoking status: Never   Smokeless tobacco: Never  Vaping Use   Vaping Use: Never used  Substance and Sexual Activity   Alcohol use: No    Alcohol/week: 0.0 standard drinks of alcohol   Drug use: No   Sexual activity: Never    Birth control/protection: Abstinence    Comment: DECLINED CONDOMS  Other Topics Concern   Not on file  Social History Narrative   Lives in group home.   Right-handed.   2-3 sodas per day.   Social Determinants of Health   Financial Resource Strain: Not on file  Food Insecurity: Not on file  Transportation Needs: Not on file  Physical Activity: Not on file  Stress: Not on file  Social Connections: Not on file    No Known Allergies   Current Outpatient Medications:    acetaminophen (TYLENOL) 500 MG tablet, Take 1 tablet (500 mg total) by mouth every 8 (eight) hours as needed for moderate pain., Disp: 30 tablet, Rfl: 0   aspirin EC 81 MG tablet, Take 81 mg by mouth every morning. Swallow whole., Disp: , Rfl:    atorvastatin (LIPITOR) 20 MG tablet, Take 1 tablet (20 mg total) by mouth at bedtime., Disp: 30 tablet, Rfl: 11   benztropine (COGENTIN) 2 MG tablet, Take 1 tablet (2 mg total) by mouth 2 (two) times daily., Disp: 60 tablet, Rfl: 2   bictegravir-emtricitabine-tenofovir AF (BIKTARVY) 50-200-25 MG TABS tablet, Take 1 tablet by mouth daily., Disp: 30 tablet, Rfl: 11   cetirizine (ZYRTEC) 10  MG tablet, TAKE 1 TABLET ONCE DAILY. (Patient taking differently: Take 10 mg by mouth every morning.), Disp: 30 tablet, Rfl: 3   cloZAPine (CLOZARIL) 100 MG tablet, Take 100 mg by mouth at bedtime., Disp: , Rfl:    clozapine (CLOZARIL) 200 MG tablet, Take 200 mg by mouth every morning., Disp: , Rfl:    cloZAPine (CLOZARIL) 50 MG tablet, 7/26 '75mg'$  QHS, 7/27 '25mg'$  AM and '100mg'$  QHS, 7/28 '75mg'$  AM and '100mg'$  QHS, 7/29 '125mg'$  AM and '100mg'$  QHS, 7/30 175 mg AM and '100mg'$  QHS, 7/31 '200mg'$  AM and '100mg'$  QHS, Disp: 180 tablet, Rfl: 0   famotidine (PEPCID) 20 MG tablet, Take 20 mg by mouth 2 (two) times daily., Disp: , Rfl:    fluticasone (FLONASE) 50 MCG/ACT nasal spray, Place 2 sprays into both nostrils every morning., Disp: , Rfl:    folic acid (FOLVITE) 1 MG tablet, Take 1 mg by mouth every morning., Disp: , Rfl:    gabapentin (NEURONTIN) 300 MG capsule, Take 300 mg by mouth every morning. Take with a 400 mg capsule for a total morning dose of 700 mg, Disp: , Rfl:    gabapentin (NEURONTIN) 400 MG capsule, Take 400 mg by mouth 2 (two) times daily. In the morning take with a 300 mg capsule for a total morning dose of 700 mg, Disp: , Rfl:    haloperidol (HALDOL) 5 MG tablet, Take 2 tablets (10 mg total) by mouth daily with supper. (Patient taking differently: Take 10 mg by mouth every morning.), Disp: 60 tablet, Rfl: 2   levETIRAcetam (KEPPRA) 750 MG tablet, Take 1 tablet (750 mg total) by mouth 2 (two) times daily., Disp: 180 tablet, Rfl: 4   metFORMIN (GLUCOPHAGE) 500 MG tablet, Take 250 mg by mouth 2 (two) times daily., Disp: , Rfl:    omeprazole (PRILOSEC) 20 MG capsule, Take 20 mg by mouth every morning., Disp: , Rfl:    polyethylene glycol (MIRALAX) 17 g packet, Take 17 g by mouth daily., Disp: 14 each, Rfl: 0   senna (SENOKOT) 8.6 MG tablet, Take 2 tablets by mouth every morning. Gerikot, Disp: , Rfl:    traZODone (DESYREL) 50 MG tablet, Take 1 tablet (50 mg total) by mouth at bedtime as needed for sleep.,  Disp: 30 tablet, Rfl: 2   valproic acid (DEPAKENE) 250 MG capsule, Take 2 capsules every morning then take 4 capsules at bedtime (Patient taking differently: 500-1,000 mg See admin instructions. Take 2 capsules (500 mg) by mouth every morning and 4 capsules (1000 mg) at night), Disp: 180 capsule, Rfl: 2   Review of Systems  Unable to perform ROS: Dementia       Objective:   Physical Exam Constitutional:      Appearance: He is well-developed.  HENT:     Head: Normocephalic and atraumatic.  Eyes:     Conjunctiva/sclera: Conjunctivae normal.  Cardiovascular:     Rate and Rhythm: Normal rate and regular rhythm.  Pulmonary:     Effort: Pulmonary effort is normal. No respiratory distress.     Breath sounds: No wheezing.  Abdominal:     General: There is no distension.     Palpations: Abdomen is soft.  Musculoskeletal:        General: No tenderness. Normal range of motion.     Cervical back: Normal range of motion and neck supple.  Skin:    General: Skin is warm and dry.     Coloration: Skin is not pale.     Findings: No erythema or rash.  Neurological:     Mental Status: He is alert.  Psychiatric:        Attention and Perception: He is inattentive.        Speech: Speech is delayed.        Behavior: Behavior is slowed.        Thought Content: Thought content normal.        Cognition and Memory: Cognition is impaired. Memory is impaired. He exhibits impaired recent memory and impaired remote memory.           Assessment & Plan:  HIV disease:  I will add order HIV viral load CD4 count CBC with differential CMP, RPR GC and chlamydia and I will continue  Ranell Patrick Grasmick's Biktarvy prescription  Upper lipidemia: He will continue on atorvastatin.  Chronic kidney disease we will recheck CMP today.  Cognitive impairment: In group home  Bipolar disorder currently on Clozaril and Cogentin.  Vaccine counseling recommend updated flu and COVID-19 vaccine

## 2022-02-18 NOTE — Patient Instructions (Signed)
Caregiver should come to next appt

## 2022-02-20 LAB — COMPLETE METABOLIC PANEL WITHOUT GFR
AG Ratio: 1.3 (calc) (ref 1.0–2.5)
ALT: 10 U/L (ref 9–46)
AST: 11 U/L (ref 10–35)
Albumin: 4.4 g/dL (ref 3.6–5.1)
Alkaline phosphatase (APISO): 73 U/L (ref 35–144)
BUN/Creatinine Ratio: 10 (calc) (ref 6–22)
BUN: 15 mg/dL (ref 7–25)
CO2: 26 mmol/L (ref 20–32)
Calcium: 9.6 mg/dL (ref 8.6–10.3)
Chloride: 104 mmol/L (ref 98–110)
Creat: 1.45 mg/dL — ABNORMAL HIGH (ref 0.70–1.35)
Globulin: 3.4 g/dL (ref 1.9–3.7)
Glucose, Bld: 125 mg/dL — ABNORMAL HIGH (ref 65–99)
Potassium: 4 mmol/L (ref 3.5–5.3)
Sodium: 139 mmol/L (ref 135–146)
Total Bilirubin: 0.3 mg/dL (ref 0.2–1.2)
Total Protein: 7.8 g/dL (ref 6.1–8.1)
eGFR: 54 mL/min/{1.73_m2} — ABNORMAL LOW

## 2022-02-20 LAB — CBC WITH DIFFERENTIAL/PLATELET
Absolute Monocytes: 462 cells/uL (ref 200–950)
Basophils Absolute: 28 cells/uL (ref 0–200)
Basophils Relative: 0.4 %
Eosinophils Absolute: 311 cells/uL (ref 15–500)
Eosinophils Relative: 4.5 %
HCT: 37.1 % — ABNORMAL LOW (ref 38.5–50.0)
Hemoglobin: 12.5 g/dL — ABNORMAL LOW (ref 13.2–17.1)
Lymphs Abs: 2187 cells/uL (ref 850–3900)
MCH: 28.9 pg (ref 27.0–33.0)
MCHC: 33.7 g/dL (ref 32.0–36.0)
MCV: 85.9 fL (ref 80.0–100.0)
MPV: 12.5 fL (ref 7.5–12.5)
Monocytes Relative: 6.7 %
Neutro Abs: 3912 cells/uL (ref 1500–7800)
Neutrophils Relative %: 56.7 %
Platelets: 156 10*3/uL (ref 140–400)
RBC: 4.32 10*6/uL (ref 4.20–5.80)
RDW: 13.6 % (ref 11.0–15.0)
Total Lymphocyte: 31.7 %
WBC: 6.9 10*3/uL (ref 3.8–10.8)

## 2022-02-20 LAB — LIPID PANEL
Cholesterol: 120 mg/dL (ref <200)
HDL: 29 mg/dL — ABNORMAL LOW (ref >=40)
LDL Cholesterol (Calc): 70 mg/dL (calc)
Non-HDL Cholesterol (Calc): 91 mg/dL (calc) (ref <130)
Total CHOL/HDL Ratio: 4.1 (calc) (ref <5.0)
Triglycerides: 126 mg/dL (ref <150)

## 2022-02-20 LAB — SYPHILIS: RPR W/REFLEX TO RPR TITER AND TREPONEMAL ANTIBODIES, TRADITIONAL SCREENING AND DIAGNOSIS ALGORITHM: RPR Ser Ql: NONREACTIVE

## 2022-02-20 LAB — T-HELPER CELLS (CD4) COUNT (NOT AT ARMC)
CD4 % Helper T Cell: 33 % (ref 33–65)
CD4 T Cell Abs: 676 /uL (ref 400–1790)

## 2022-02-20 LAB — HIV-1 RNA QUANT-NO REFLEX-BLD
HIV 1 RNA Quant: NOT DETECTED Copies/mL
HIV-1 RNA Quant, Log: NOT DETECTED Log cps/mL

## 2022-04-21 ENCOUNTER — Encounter: Payer: Self-pay | Admitting: Infectious Disease

## 2022-04-21 DIAGNOSIS — Z7185 Encounter for immunization safety counseling: Secondary | ICD-10-CM

## 2022-04-21 HISTORY — DX: Encounter for immunization safety counseling: Z71.85

## 2022-04-21 NOTE — Progress Notes (Deleted)
Subjective:  Chief complaint:     Patient ID: Nathan [name], male    DOB: [birth year], 62 y.o.   MRN: [id number]  HPI  Nathan [name] is a 62 year old black man with multiple medical problems including HIV that has been well controlled cognitive disability with aggressive behavior at times bipolar schizoaffective disorder.  His viral load has been nicely controlled on Biktarvy.  Continues to live in a group home.  He was brought to clinic today and when I entered the room he did not know why he was in the clinic.  He could not really give me much information frankly at all.  Hopefully he is still taking his medications religiously and suppressed.      Past Medical History:  Diagnosis Date   Anemia    Anemia    Anxiety    Bipolar 1 disorder ([place])    Depression, major    HIV (human immunodeficiency virus infection) ([place])    HIV disease ([place]) 07/31/2021   Hyperglycemia 08/06/2017   Hypernatremia    Kidney failure    Lithium toxicity    MR (mental retardation)    Schizoaffective disorder ([place]) 08/06/2017   Schizophrenia ([place])    Seizures ([place])    Vaccine counseling 04/21/2022    Past Surgical History:  Procedure Laterality Date   BIOPSY N/A 04/12/2014   Procedure: BIOPSY;  Surgeon: Danie Binder, MD;  Location: [facility];  Service: Endoscopy;  Laterality: N/A;  Gastric   COLONOSCOPY WITH PROPOFOL N/A 04/12/2014   SLF:  1. one colon polyp removed 2. The left colon is extremely redundant 3. Small internal hemorrhoids   ESOPHAGOGASTRODUODENOSCOPY (EGD) WITH PROPOFOL N/A 04/12/2014   SLF: Stricture at the gastroesophageal junction 2. small polyp  in teh gastric body 3. Mild Non-erosive gastritis.    NOSE SURGERY  1968   unsure what type of surgery. something to help him breathe better   POLYPECTOMY N/A 04/12/2014   Procedure: POLYPECTOMY;  Surgeon: Danie Binder, MD;  Location: [facility];  Service: Endoscopy;  Laterality: N/A;  Cecal   SAVORY DILATION N/A 04/12/2014   Procedure: SAVORY  DILATION;  Surgeon: Danie Binder, MD;  Location: [facility];  Service: Endoscopy;  Laterality: N/A;  12.8/ 14/15/16   SKIN BIOPSY      Family History  Problem Relation Age of Onset   Other Mother        unknown medical history   Other Father        unknown medical history   Colon cancer Neg Hx       Social History   Socioeconomic History   Marital status: Single    Spouse name: Not on file   Number of children: 0   Years of education: 9th grade   Highest education level: Not on file  Occupational History   Occupation: Disabled  Tobacco Use   Smoking status: Never   Smokeless tobacco: Never  Vaping Use   Vaping Use: Never used  Substance and Sexual Activity   Alcohol use: No    Alcohol/week: 0.0 standard drinks of alcohol   Drug use: No   Sexual activity: Never    Birth control/protection: Abstinence    Comment: DECLINED CONDOMS  Other Topics Concern   Not on file  Social History Narrative   Lives in group home.   Right-handed.   2-3 sodas per day.   Social Determinants of Health   Financial Resource Strain: Not on file  Food Insecurity: Not on file  Transportation Needs: Not on file  Physical Activity: Not on file  Stress: Not on file  Social Connections: Not on file    No Known Allergies   Current Outpatient Medications:    acetaminophen (TYLENOL) 500 MG tablet, Take 1 tablet (500 mg total) by mouth every 8 (eight) hours as needed for moderate pain., Disp: 30 tablet, Rfl: 0   aspirin EC 81 MG tablet, Take 81 mg by mouth every morning. Swallow whole., Disp: , Rfl:    atorvastatin (LIPITOR) 20 MG tablet, Take 1 tablet (20 mg total) by mouth at bedtime., Disp: 30 tablet, Rfl: 11   benztropine (COGENTIN) 2 MG tablet, Take 1 tablet (2 mg total) by mouth 2 (two) times daily., Disp: 60 tablet, Rfl: 2   bictegravir-emtricitabine-tenofovir AF (BIKTARVY) 50-200-25 MG TABS tablet, Take 1 tablet by mouth daily., Disp: 30 tablet, Rfl: 11   cetirizine (ZYRTEC) 10 MG  tablet, TAKE 1 TABLET ONCE DAILY. (Patient taking differently: Take 10 mg by mouth every morning.), Disp: 30 tablet, Rfl: 3   cloZAPine (CLOZARIL) 100 MG tablet, Take 100 mg by mouth at bedtime., Disp: , Rfl:    clozapine (CLOZARIL) 200 MG tablet, Take 200 mg by mouth every morning., Disp: , Rfl:    cloZAPine (CLOZARIL) 50 MG tablet, 7/26 68m QHS, 7/27 250mAM and 10021mHS, 7/28 39m29m and 100mg76m, 7/29 125mg 41mnd 100mg Q1m7/30 175 mg AM and 100mg QH62m/31 200mg AM 65m100mg QHS,47mp: 180 tablet, Rfl: 0   famotidine (PEPCID) 20 MG tablet, Take 20 mg by mouth 2 (two) times daily., Disp: , Rfl:    fluticasone (FLONASE) 50 MCG/ACT nasal spray, Place 2 sprays into both nostrils every morning., Disp: , Rfl:    folic acid (FOLVITE) 1 MG tablet, Take 1 mg by mouth every morning., Disp: , Rfl:    gabapentin (NEURONTIN) 300 MG capsule, Take 300 mg by mouth every morning. Take with a 400 mg capsule for a total morning dose of 700 mg, Disp: , Rfl:    gabapentin (NEURONTIN) 400 MG capsule, Take 400 mg by mouth 2 (two) times daily. In the morning take with a 300 mg capsule for a total morning dose of 700 mg, Disp: , Rfl:    haloperidol (HALDOL) 5 MG tablet, Take 2 tablets (10 mg total) by mouth daily with supper. (Patient taking differently: Take 10 mg by mouth every morning.), Disp: 60 tablet, Rfl: 2   levETIRAcetam (KEPPRA) 750 MG tablet, Take 1 tablet (750 mg total) by mouth 2 (two) times daily., Disp: 180 tablet, Rfl: 4   metFORMIN (GLUCOPHAGE) 500 MG tablet, Take 250 mg by mouth 2 (two) times daily., Disp: , Rfl:    omeprazole (PRILOSEC) 20 MG capsule, Take 20 mg by mouth every morning., Disp: , Rfl:    polyethylene glycol (MIRALAX) 17 g packet, Take 17 g by mouth daily., Disp: 14 each, Rfl: 0   senna (SENOKOT) 8.6 MG tablet, Take 2 tablets by mouth every morning. Gerikot, Disp: , Rfl:    traZODone (DESYREL) 50 MG tablet, Take 1 tablet (50 mg total) by mouth at bedtime as needed for sleep., Disp:  30 tablet, Rfl: 2   valproic acid (DEPAKENE) 250 MG capsule, Take 2 capsules every morning then take 4 capsules at bedtime (Patient taking differently: 500-1,000 mg See admin instructions. Take 2 capsules (500 mg) by mouth every morning and 4 capsules (1000 mg) at night), Disp: 180 capsule, Rfl: 2   Review of Systems  Unable to perform ROS: Dementia       Objective:   Physical Exam        Assessment & Plan:  HIV disease:  I will add order HIV viral load CD4 count CBC with differential CMP, RPR GC and chlamydia and I will continue  [employer],  prescription  Hyperlipidemia:  I will check lipid panel today and continue his atorvastatin  CKD: I will check a CMP  Cognitive impairment: In group home  Bipolar disorder currently on Clozaril and Cogentin.  Vaccine counseling: recommended updated COVID 19 vaccine, DTAP and Shingrix

## 2022-04-22 ENCOUNTER — Ambulatory Visit: Payer: 59 | Admitting: Infectious Disease

## 2022-04-22 DIAGNOSIS — B20 Human immunodeficiency virus [HIV] disease: Secondary | ICD-10-CM

## 2022-04-22 DIAGNOSIS — F209 Schizophrenia, unspecified: Secondary | ICD-10-CM

## 2022-04-22 DIAGNOSIS — N1831 Chronic kidney disease, stage 3a: Secondary | ICD-10-CM

## 2022-04-22 DIAGNOSIS — F79 Unspecified intellectual disabilities: Secondary | ICD-10-CM

## 2022-04-22 DIAGNOSIS — Z7185 Encounter for immunization safety counseling: Secondary | ICD-10-CM

## 2022-04-22 DIAGNOSIS — F3162 Bipolar disorder, current episode mixed, moderate: Secondary | ICD-10-CM

## 2022-07-01 ENCOUNTER — Ambulatory Visit: Payer: 59 | Admitting: Infectious Disease

## 2022-08-28 ENCOUNTER — Ambulatory Visit: Payer: 59 | Admitting: Infectious Disease

## 2022-08-30 ENCOUNTER — Ambulatory Visit: Payer: 59 | Admitting: Infectious Disease

## 2022-09-24 ENCOUNTER — Other Ambulatory Visit: Payer: Self-pay

## 2022-09-24 ENCOUNTER — Encounter: Payer: Self-pay | Admitting: Infectious Disease

## 2022-09-24 ENCOUNTER — Ambulatory Visit (INDEPENDENT_AMBULATORY_CARE_PROVIDER_SITE_OTHER): Payer: 59 | Admitting: Infectious Disease

## 2022-09-24 VITALS — BP 126/77 | HR 79 | Temp 97.4°F | Wt 161.0 lb

## 2022-09-24 DIAGNOSIS — N189 Chronic kidney disease, unspecified: Secondary | ICD-10-CM

## 2022-09-24 DIAGNOSIS — F319 Bipolar disorder, unspecified: Secondary | ICD-10-CM | POA: Diagnosis not present

## 2022-09-24 DIAGNOSIS — B20 Human immunodeficiency virus [HIV] disease: Secondary | ICD-10-CM

## 2022-09-24 DIAGNOSIS — N1831 Chronic kidney disease, stage 3a: Secondary | ICD-10-CM

## 2022-09-24 DIAGNOSIS — Z7185 Encounter for immunization safety counseling: Secondary | ICD-10-CM

## 2022-09-24 DIAGNOSIS — E785 Hyperlipidemia, unspecified: Secondary | ICD-10-CM | POA: Diagnosis not present

## 2022-09-24 DIAGNOSIS — R4189 Other symptoms and signs involving cognitive functions and awareness: Secondary | ICD-10-CM

## 2022-09-24 DIAGNOSIS — F3162 Bipolar disorder, current episode mixed, moderate: Secondary | ICD-10-CM

## 2022-09-24 HISTORY — DX: Other symptoms and signs involving cognitive functions and awareness: R41.89

## 2022-09-24 LAB — LIPID PANEL: Total CHOL/HDL Ratio: 4.1 (calc) (ref <5.0)

## 2022-09-24 MED ORDER — BIKTARVY 50-200-25 MG PO TABS: 1.0000 | ORAL_TABLET | Freq: Every day | ORAL | 11 refills | Status: DC

## 2022-09-24 MED ORDER — ATORVASTATIN CALCIUM 20 MG PO TABS: 20.0000 mg | ORAL_TABLET | Freq: Every day | ORAL | 11 refills | Status: DC

## 2022-09-24 NOTE — Addendum Note (Signed)
Addended by: Harley Alto on: 09/24/2022 02:07 PM   Modules accepted: Orders

## 2022-09-24 NOTE — Progress Notes (Signed)
Subjective:  Chief complaint: Here for follow-up today for his HIV disease on Biktarvy patient ID: Nathan [name], male    DOB: 10-23-1960, 62 y.o.   MRN: [id number]  HPI  Jac is a 62 year old black man with multiple medical problems including HIV that has been well controlled cognitive disability with aggressive behavior at times bipolar schizoaffective disorder.  His viral load has been nicely controlled on Biktarvy.  Continues to live in a group home.   We did not have a medication list her vaccine record on hand when he came to clinic but we are obtaining them via fax.       Past Medical History:  Diagnosis Date   Anemia    Anemia    Anxiety    Bipolar 1 disorder ([facility])    Depression, major    HIV (human immunodeficiency virus infection) ([facility])    HIV disease ([facility]) 07/31/2021   Hyperglycemia 08/06/2017   Hypernatremia    Kidney failure    Lithium toxicity    MR (mental retardation)    Schizoaffective disorder ([facility]) 08/06/2017   Schizophrenia ([facility])    Seizures ([facility])    Vaccine counseling 04/21/2022    Past Surgical History:  Procedure Laterality Date   BIOPSY N/A 04/12/2014   Procedure: BIOPSY;  Surgeon: West Bali, MD;  Location: [facility];  Service: Endoscopy;  Laterality: N/A;  Gastric   COLONOSCOPY WITH PROPOFOL N/A 04/12/2014   SLF:  1. one colon polyp removed 2. The left colon is extremely redundant 3. Small internal hemorrhoids   ESOPHAGOGASTRODUODENOSCOPY (EGD) WITH PROPOFOL N/A 04/12/2014   SLF: Stricture at the gastroesophageal junction 2. small polyp  in teh gastric body 3. Mild Non-erosive gastritis.    NOSE SURGERY  1968   unsure what type of surgery. something to help him breathe better   POLYPECTOMY N/A 04/12/2014   Procedure: POLYPECTOMY;  Surgeon: West Bali, MD;  Location: [facility];  Service: Endoscopy;  Laterality: N/A;  Cecal   SAVORY DILATION N/A 04/12/2014   Procedure: SAVORY DILATION;  Surgeon: West Bali, MD;  Location: [facility];   Service: Endoscopy;  Laterality: N/A;  12.8/ 14/15/16   SKIN BIOPSY      Family History  Problem Relation Age of Onset   Other Mother        unknown medical history   Other Father        unknown medical history   Colon cancer Neg Hx       Social History   Socioeconomic History   Marital status: Single    Spouse name: Not on file   Number of children: 0   Years of education: 9th grade   Highest education level: Not on file  Occupational History   Occupation: Disabled  Tobacco Use   Smoking status: Never   Smokeless tobacco: Never  Vaping Use   Vaping Use: Never used  Substance and Sexual Activity   Alcohol use: No    Alcohol/week: 0.0 standard drinks of alcohol   Drug use: No   Sexual activity: Never    Birth control/protection: Abstinence    Comment: DECLINED CONDOMS  Other Topics Concern   Not on file  Social History Narrative   Lives in group home.   Right-handed.   2-3 sodas per day.   Social Determinants of Health   Financial Resource Strain: Not on file  Food Insecurity: Not on file  Transportation Needs: Not on file  Physical Activity: Not on file  Stress: Not on file  Social Connections: Not on file    No Known Allergies   Current Outpatient Medications:    acetaminophen (TYLENOL) 500 MG tablet, Take 1 tablet (500 mg total) by mouth every 8 (eight) hours as needed for moderate pain., Disp: 30 tablet, Rfl: 0   aspirin EC 81 MG tablet, Take 81 mg by mouth every morning. Swallow whole., Disp: , Rfl:    atorvastatin (LIPITOR) 20 MG tablet, Take 1 tablet (20 mg total) by mouth at bedtime., Disp: 30 tablet, Rfl: 11   benztropine (COGENTIN) 2 MG tablet, Take 1 tablet (2 mg total) by mouth 2 (two) times daily., Disp: 60 tablet, Rfl: 2   bictegravir-emtricitabine-tenofovir AF (BIKTARVY) 50-200-25 MG TABS tablet, Take 1 tablet by mouth daily., Disp: 30 tablet, Rfl: 11   cetirizine (ZYRTEC) 10 MG tablet, TAKE 1 TABLET ONCE DAILY. (Patient taking differently:  Take 10 mg by mouth every morning.), Disp: 30 tablet, Rfl: 3   cloZAPine (CLOZARIL) 100 MG tablet, Take 100 mg by mouth at bedtime., Disp: , Rfl:    clozapine (CLOZARIL) 200 MG tablet, Take 200 mg by mouth every morning., Disp: , Rfl:    cloZAPine (CLOZARIL) 50 MG tablet, 7/26 75mg  QHS, 7/27 25mg  AM and 100mg  QHS, 7/28 75mg  AM and 100mg  QHS, 7/29 125mg  AM and 100mg  QHS, 7/30 175 mg AM and 100mg  QHS, 7/31 200mg  AM and 100mg  QHS, Disp: 180 tablet, Rfl: 0   famotidine (PEPCID) 20 MG tablet, Take 20 mg by mouth 2 (two) times daily., Disp: , Rfl:    fluticasone (FLONASE) 50 MCG/ACT nasal spray, Place 2 sprays into both nostrils every morning., Disp: , Rfl:    folic acid (FOLVITE) 1 MG tablet, Take 1 mg by mouth every morning., Disp: , Rfl:    gabapentin (NEURONTIN) 300 MG capsule, Take 300 mg by mouth every morning. Take with a 400 mg capsule for a total morning dose of 700 mg, Disp: , Rfl:    gabapentin (NEURONTIN) 400 MG capsule, Take 400 mg by mouth 2 (two) times daily. In the morning take with a 300 mg capsule for a total morning dose of 700 mg, Disp: , Rfl:    haloperidol (HALDOL) 5 MG tablet, Take 2 tablets (10 mg total) by mouth daily with supper. (Patient taking differently: Take 10 mg by mouth every morning.), Disp: 60 tablet, Rfl: 2   levETIRAcetam (KEPPRA) 750 MG tablet, Take 1 tablet (750 mg total) by mouth 2 (two) times daily., Disp: 180 tablet, Rfl: 4   metFORMIN (GLUCOPHAGE) 500 MG tablet, Take 250 mg by mouth 2 (two) times daily., Disp: , Rfl:    omeprazole (PRILOSEC) 20 MG capsule, Take 20 mg by mouth every morning., Disp: , Rfl:    polyethylene glycol (MIRALAX) 17 g packet, Take 17 g by mouth daily., Disp: 14 each, Rfl: 0   senna (SENOKOT) 8.6 MG tablet, Take 2 tablets by mouth every morning. Gerikot, Disp: , Rfl:    traZODone (DESYREL) 50 MG tablet, Take 1 tablet (50 mg total) by mouth at bedtime as needed for sleep., Disp: 30 tablet, Rfl: 2   valproic acid (DEPAKENE) 250 MG capsule,  Take 2 capsules every morning then take 4 capsules at bedtime (Patient taking differently: 500-1,000 mg See admin instructions. Take 2 capsules (500 mg) by mouth every morning and 4 capsules (1000 mg) at night), Disp: 180 capsule, Rfl: 2   Review of Systems  Unable to perform ROS: Dementia  Objective:   Physical Exam Constitutional:      Appearance: He is well-developed.  HENT:     Head: Normocephalic and atraumatic.  Eyes:     Conjunctiva/sclera: Conjunctivae normal.  Cardiovascular:     Rate and Rhythm: Normal rate and regular rhythm.  Pulmonary:     Effort: Pulmonary effort is normal. No respiratory distress.     Breath sounds: No wheezing.  Abdominal:     General: There is no distension.     Palpations: Abdomen is soft.  Musculoskeletal:        General: No tenderness. Normal range of motion.     Cervical back: Normal range of motion and neck supple.  Skin:    General: Skin is warm and dry.     Coloration: Skin is not pale.     Findings: No erythema or rash.  Neurological:     General: No focal deficit present.     Mental Status: He is alert.  Psychiatric:        Attention and Perception: He is inattentive.        Speech: Speech is delayed.        Behavior: Behavior is slowed. Behavior is cooperative.        Cognition and Memory: Cognition is impaired. Memory is impaired. He exhibits impaired recent memory and impaired remote memory.           Assessment & Plan:   ,HIV disease:  I will add order HIV viral load CD4 count CBC with differential CMP, RPR GC and chlamydia and I will continue  Tinnie Gens R [employer]  prescription   Hyperlipidemia: Will recheck lipid panel and continue his atorvastatin  Chronic kidney disease we will recheck CMP  Cognitive impairment living in group home  Chronic kidney disease we will recheck CMP today.  Cognitive impairment: In group home  Bipolar disorder currently on Clozaril and Cogentin.

## 2022-09-25 LAB — T-HELPER CELLS (CD4) COUNT (NOT AT ARMC)
CD4 % Helper T Cell: 31 % — ABNORMAL LOW (ref 33–65)
CD4 T Cell Abs: 538 /uL (ref 400–1790)

## 2022-09-26 LAB — CBC WITH DIFFERENTIAL/PLATELET
Absolute Monocytes: 608 cells/uL (ref 200–950)
Basophils Absolute: 38 cells/uL (ref 0–200)
Basophils Relative: 0.5 %
Eosinophils Absolute: 323 cells/uL (ref 15–500)
Eosinophils Relative: 4.3 %
HCT: 36.7 % — ABNORMAL LOW (ref 38.5–50.0)
Hemoglobin: 11.9 g/dL — ABNORMAL LOW (ref 13.2–17.1)
Lymphs Abs: 1965 cells/uL (ref 850–3900)
MCH: 28.4 pg (ref 27.0–33.0)
MCHC: 32.4 g/dL (ref 32.0–36.0)
MCV: 87.6 fL (ref 80.0–100.0)
MPV: 12.8 fL — ABNORMAL HIGH (ref 7.5–12.5)
Monocytes Relative: 8.1 %
Neutro Abs: 4568 cells/uL (ref 1500–7800)
Neutrophils Relative %: 60.9 %
Platelets: 137 10*3/uL — ABNORMAL LOW (ref 140–400)
RBC: 4.19 10*6/uL — ABNORMAL LOW (ref 4.20–5.80)
RDW: 14.1 % (ref 11.0–15.0)
Total Lymphocyte: 26.2 %
WBC: 7.5 10*3/uL (ref 3.8–10.8)

## 2022-09-26 LAB — COMPLETE METABOLIC PANEL WITH GFR
AG Ratio: 1.5 (calc) (ref 1.0–2.5)
ALT: 10 U/L (ref 9–46)
AST: 11 U/L (ref 10–35)
Albumin: 4.4 g/dL (ref 3.6–5.1)
Alkaline phosphatase (APISO): 78 U/L (ref 35–144)
BUN/Creatinine Ratio: 11 (calc) (ref 6–22)
BUN: 16 mg/dL (ref 7–25)
CO2: 34 mmol/L — ABNORMAL HIGH (ref 20–32)
Calcium: 9.6 mg/dL (ref 8.6–10.3)
Chloride: 104 mmol/L (ref 98–110)
Creat: 1.47 mg/dL — ABNORMAL HIGH (ref 0.70–1.35)
Globulin: 3 g/dL (calc) (ref 1.9–3.7)
Glucose, Bld: 128 mg/dL — ABNORMAL HIGH (ref 65–99)
Potassium: 4.1 mmol/L (ref 3.5–5.3)
Sodium: 142 mmol/L (ref 135–146)
Total Bilirubin: 0.3 mg/dL (ref 0.2–1.2)
Total Protein: 7.4 g/dL (ref 6.1–8.1)
eGFR: 53 mL/min/{1.73_m2} — ABNORMAL LOW (ref >=60)

## 2022-09-26 LAB — LIPID PANEL
Cholesterol: 123 mg/dL (ref <200)
HDL: 30 mg/dL — ABNORMAL LOW (ref >=40)
LDL Cholesterol (Calc): 72 mg/dL (calc)
Non-HDL Cholesterol (Calc): 93 mg/dL (calc) (ref <130)
Triglycerides: 125 mg/dL (ref <150)

## 2022-09-26 LAB — HIV-1 RNA QUANT-NO REFLEX-BLD
HIV 1 RNA Quant: NOT DETECTED Copies/mL
HIV-1 RNA Quant, Log: NOT DETECTED Log cps/mL

## 2022-09-26 LAB — RPR: RPR Ser Ql: NONREACTIVE

## 2023-01-27 ENCOUNTER — Other Ambulatory Visit (HOSPITAL_COMMUNITY)
Admission: RE | Admit: 2023-01-27 | Discharge: 2023-01-27 | Disposition: A | Discharge status: Home / Self Care | Payer: 59 | Source: Ambulatory Visit | Attending: Infectious Disease | Admitting: Infectious Disease

## 2023-01-27 ENCOUNTER — Encounter: Payer: Self-pay | Admitting: Infectious Disease

## 2023-01-27 ENCOUNTER — Ambulatory Visit (INDEPENDENT_AMBULATORY_CARE_PROVIDER_SITE_OTHER): Payer: 59 | Admitting: Infectious Disease

## 2023-01-27 ENCOUNTER — Other Ambulatory Visit: Payer: Self-pay

## 2023-01-27 VITALS — BP 129/83 | HR 90 | Temp 97.8°F | Resp 16 | Wt 164.4 lb

## 2023-01-27 DIAGNOSIS — F79 Unspecified intellectual disabilities: Secondary | ICD-10-CM | POA: Diagnosis not present

## 2023-01-27 DIAGNOSIS — N1831 Chronic kidney disease, stage 3a: Secondary | ICD-10-CM

## 2023-01-27 DIAGNOSIS — Z7185 Encounter for immunization safety counseling: Secondary | ICD-10-CM

## 2023-01-27 DIAGNOSIS — B20 Human immunodeficiency virus [HIV] disease: Secondary | ICD-10-CM

## 2023-01-27 DIAGNOSIS — Z23 Encounter for immunization: Secondary | ICD-10-CM

## 2023-01-27 DIAGNOSIS — E785 Hyperlipidemia, unspecified: Secondary | ICD-10-CM | POA: Insufficient documentation

## 2023-01-27 DIAGNOSIS — G40909 Epilepsy, unspecified, not intractable, without status epilepticus: Secondary | ICD-10-CM

## 2023-01-27 DIAGNOSIS — F39 Unspecified mood [affective] disorder: Secondary | ICD-10-CM

## 2023-01-27 MED ORDER — BIKTARVY 50-200-25 MG PO TABS: 1.0000 | ORAL_TABLET | Freq: Every day | ORAL | 11 refills | Status: DC

## 2023-01-27 MED ORDER — ATORVASTATIN CALCIUM 20 MG PO TABS: 20.0000 mg | ORAL_TABLET | Freq: Every day | ORAL | 11 refills | Status: DC

## 2023-01-27 NOTE — Progress Notes (Signed)
Subjective:  Chief complaint: Follow-up for HIV disease on medications  Patient ID: Nathan [name], male    DOB: [birth year], 62 y.o.   MRN: [id number]  HPI  Discussed the use of AI scribe software for clinical note transcription with the patient and surrogate, who gave verbal consent to proceed.  History of Present Illness   The patient, with a history of HIV, hyperlipidemia, diabetes, chronic kidney disease, and a mood disorder, presents for a routine follow-up. He is currently on atorvastatin, metformin, trazodone, Biktarvy, divalproex, clozapine, Haldol, and Keppra. The patient's HIV is well controlled with Biktarvy, as evidenced by an undetectable viral load and a healthy CD4 count. His kidney function is stable despite chronic kidney disease. The patient also has a seizure disorder, which is managed by a neurologist. The patient's mood is stabilized with divalproex, clozapine, Haldol, and Keppra.       Past Medical History:  Diagnosis Date   Anemia    Anemia    Anxiety    Bipolar 1 disorder ([facility])    Cognitive impairment 09/24/2022   Depression, major    HIV (human immunodeficiency virus infection) ([facility])    HIV disease ([facility]) 07/31/2021   Hyperglycemia 08/06/2017   Hypernatremia    Kidney failure    Lithium toxicity    MR (mental retardation)    Schizoaffective disorder ([facility]) 08/06/2017   Schizophrenia ([facility])    Seizures ([facility])    Vaccine counseling 04/21/2022    Past Surgical History:  Procedure Laterality Date   BIOPSY N/A 04/12/2014   Procedure: BIOPSY;  Surgeon: West Bali, MD;  Location: [facility];  Service: Endoscopy;  Laterality: N/A;  Gastric   COLONOSCOPY WITH PROPOFOL N/A 04/12/2014   SLF:  1. one colon polyp removed 2. The left colon is extremely redundant 3. Small internal hemorrhoids   ESOPHAGOGASTRODUODENOSCOPY (EGD) WITH PROPOFOL N/A 04/12/2014   SLF: Stricture at the gastroesophageal junction 2. small polyp  in teh gastric body 3. Mild Non-erosive gastritis.     NOSE SURGERY  1968   unsure what type of surgery. something to help him breathe better   POLYPECTOMY N/A 04/12/2014   Procedure: POLYPECTOMY;  Surgeon: West Bali, MD;  Location: [facility];  Service: Endoscopy;  Laterality: N/A;  Cecal   SAVORY DILATION N/A 04/12/2014   Procedure: SAVORY DILATION;  Surgeon: West Bali, MD;  Location: [facility];  Service: Endoscopy;  Laterality: N/A;  12.8/ 14/15/16   SKIN BIOPSY      Family History  Problem Relation Age of Onset   Other Mother        unknown medical history   Other Father        unknown medical history   Colon cancer Neg Hx       Social History   Socioeconomic History   Marital status: Single    Spouse name: Not on file   Number of children: 0   Years of education: 9th grade   Highest education level: Not on file  Occupational History   Occupation: Disabled  Tobacco Use   Smoking status: Never   Smokeless tobacco: Never  Vaping Use   Vaping status: Never Used  Substance and Sexual Activity   Alcohol use: No    Alcohol/week: 0.0 standard drinks of alcohol   Drug use: No   Sexual activity: Never    Birth control/protection: Abstinence    Comment: DECLINED CONDOMS  Other Topics Concern   Not on file  Social History Narrative  Lives in group home.   Right-handed.   2-3 sodas per day.   Social Determinants of Health   Financial Resource Strain: Not on file  Food Insecurity: Not on file  Transportation Needs: Not on file  Physical Activity: Not on file  Stress: Not on file  Social Connections: Not on file    No Known Allergies   Current Outpatient Medications:    acetaminophen (TYLENOL) 500 MG tablet, Take 1 tablet (500 mg total) by mouth every 8 (eight) hours as needed for moderate pain., Disp: 30 tablet, Rfl: 0   aspirin EC 81 MG tablet, Take 81 mg by mouth every morning. Swallow whole., Disp: , Rfl:    atorvastatin (LIPITOR) 20 MG tablet, Take 1 tablet (20 mg total) by mouth at bedtime., Disp: 30 tablet,  Rfl: 11   benztropine (COGENTIN) 2 MG tablet, Take 1 tablet (2 mg total) by mouth 2 (two) times daily., Disp: 60 tablet, Rfl: 2   bictegravir-emtricitabine-tenofovir AF (BIKTARVY) 50-200-25 MG TABS tablet, Take 1 tablet by mouth daily., Disp: 30 tablet, Rfl: 11   cetirizine (ZYRTEC) 10 MG tablet, TAKE 1 TABLET ONCE DAILY. (Patient taking differently: Take 10 mg by mouth every morning.), Disp: 30 tablet, Rfl: 3   cloZAPine (CLOZARIL) 100 MG tablet, Take 100 mg by mouth at bedtime., Disp: , Rfl:    clozapine (CLOZARIL) 200 MG tablet, Take 200 mg by mouth every morning., Disp: , Rfl:    famotidine (PEPCID) 20 MG tablet, Take 20 mg by mouth 2 (two) times daily., Disp: , Rfl:    fluticasone (FLONASE) 50 MCG/ACT nasal spray, Place 2 sprays into both nostrils every morning., Disp: , Rfl:    folic acid (FOLVITE) 1 MG tablet, Take 1 mg by mouth every morning., Disp: , Rfl:    gabapentin (NEURONTIN) 300 MG capsule, Take 300 mg by mouth every morning. Take with a 400 mg capsule for a total morning dose of 700 mg, Disp: , Rfl:    haloperidol (HALDOL) 5 MG tablet, Take 2 tablets (10 mg total) by mouth daily with supper. (Patient taking differently: Take 10 mg by mouth every morning.), Disp: 60 tablet, Rfl: 2   levETIRAcetam (KEPPRA) 750 MG tablet, Take 1 tablet (750 mg total) by mouth 2 (two) times daily., Disp: 180 tablet, Rfl: 4   metFORMIN (GLUCOPHAGE) 500 MG tablet, Take 250 mg by mouth 2 (two) times daily., Disp: , Rfl:    omeprazole (PRILOSEC) 20 MG capsule, Take 20 mg by mouth every morning., Disp: , Rfl:    polyethylene glycol (MIRALAX) 17 g packet, Take 17 g by mouth daily., Disp: 14 each, Rfl: 0   senna (SENOKOT) 8.6 MG tablet, Take 2 tablets by mouth every morning. Gerikot, Disp: , Rfl:    traZODone (DESYREL) 50 MG tablet, Take 1 tablet (50 mg total) by mouth at bedtime as needed for sleep., Disp: 30 tablet, Rfl: 2   cloZAPine (CLOZARIL) 50 MG tablet, 7/26 75mg  QHS, 7/27 25mg  AM and 100mg  QHS, 7/28  75mg  AM and 100mg  QHS, 7/29 125mg  AM and 100mg  QHS, 7/30 175 mg AM and 100mg  QHS, 7/31 200mg  AM and 100mg  QHS, Disp: 180 tablet, Rfl: 0   divalproex (DEPAKOTE) 250 MG DR tablet, Take by mouth., Disp: , Rfl:    gabapentin (NEURONTIN) 400 MG capsule, Take 400 mg by mouth 2 (two) times daily. In the morning take with a 300 mg capsule for a total morning dose of 700 mg (Patient not taking: Reported on 01/27/2023), Disp: ,  Rfl:    valproic acid (DEPAKENE) 250 MG capsule, Take 2 capsules every morning then take 4 capsules at bedtime (Patient not taking: Reported on 01/27/2023), Disp: 180 capsule, Rfl: 2   Review of Systems  Unable to perform ROS: Psychiatric disorder       Objective:   Physical Exam Constitutional:      Appearance: He is well-developed.  HENT:     Head: Normocephalic and atraumatic.  Eyes:     Conjunctiva/sclera: Conjunctivae normal.  Cardiovascular:     Rate and Rhythm: Normal rate and regular rhythm.  Pulmonary:     Effort: Pulmonary effort is normal. No respiratory distress.     Breath sounds: No wheezing.  Abdominal:     General: There is no distension.     Palpations: Abdomen is soft.  Musculoskeletal:        General: No tenderness. Normal range of motion.     Cervical back: Normal range of motion and neck supple.  Skin:    General: Skin is warm and dry.     Coloration: Skin is not pale.     Findings: No erythema or rash.  Neurological:     General: No focal deficit present.     Mental Status: He is alert and oriented to person, place, and time.  Psychiatric:        Mood and Affect: Mood normal.        Speech: Speech is delayed.        Behavior: Behavior normal.        Thought Content: Thought content normal.        Cognition and Memory: He exhibits impaired recent memory and impaired remote memory.        Judgment: Judgment normal.     Comments: Does not talk very much           Assessment & Plan:   Assessment and Plan    HIV Well controlled  on Biktarvy with undetectable viral load and CD4 count of 538. -Continue Biktarvy as prescribed. --check HIV RNA, quant, CD4 CMP, CBC w diff, RPR GC and chlamydia test in urine  Seizure Disorder No recent seizures reported. On multiple medications including Divalproex,  and Keppra. -Continue current medications as prescribed.\   BIpolar disorder; on haldol, clozaril which are continued  Hyperlipidemia On Atorvastatin. -Continue Atorvastatin as prescribed.  Chronic Kidney Disease Stable kidney function on last check. -Monitor kidney function regularly.  Possible Diabetes Unclear status, previously on Metformin, which was stopped? but aide said started at higher dose now  Plan to check A1c. -Check A1c today.  General Health Maintenance -Administer influenza and COVID-19 vaccines today. -Follow-up in 6 months.

## 2023-01-27 NOTE — Progress Notes (Signed)
RW tab completion, baseline entry to care labs not available in Epic.   Sandie Ano, RN

## 2023-01-28 ENCOUNTER — Ambulatory Visit: Payer: 59 | Admitting: Infectious Disease

## 2023-01-28 LAB — URINE CYTOLOGY ANCILLARY ONLY
Chlamydia: NEGATIVE
Comment: NEGATIVE
Comment: NORMAL
Neisseria Gonorrhea: NEGATIVE

## 2023-01-28 LAB — T-HELPER CELLS (CD4) COUNT (NOT AT ARMC)
CD4 % Helper T Cell: 34 % (ref 33–65)
CD4 T Cell Abs: 606 /uL (ref 400–1790)

## 2023-01-30 LAB — CBC WITH DIFFERENTIAL/PLATELET
Absolute Lymphocytes: 1931 {cells}/uL (ref 850–3900)
Absolute Monocytes: 644 {cells}/uL (ref 200–950)
Basophils Absolute: 52 {cells}/uL (ref 0–200)
Basophils Relative: 0.6 %
Eosinophils Absolute: 748 {cells}/uL — ABNORMAL HIGH (ref 15–500)
Eosinophils Relative: 8.6 %
HCT: 38.3 % — ABNORMAL LOW (ref 38.5–50.0)
Hemoglobin: 12.4 g/dL — ABNORMAL LOW (ref 13.2–17.1)
MCH: 29 pg (ref 27.0–33.0)
MCHC: 32.4 g/dL (ref 32.0–36.0)
MCV: 89.5 fL (ref 80.0–100.0)
MPV: 12.5 fL (ref 7.5–12.5)
Monocytes Relative: 7.4 %
Neutro Abs: 5324 {cells}/uL (ref 1500–7800)
Neutrophils Relative %: 61.2 %
Platelets: 161 10*3/uL (ref 140–400)
RBC: 4.28 10*6/uL (ref 4.20–5.80)
RDW: 13.3 % (ref 11.0–15.0)
Total Lymphocyte: 22.2 %
WBC: 8.7 10*3/uL (ref 3.8–10.8)

## 2023-01-30 LAB — COMPLETE METABOLIC PANEL WITH GFR
AG Ratio: 1.3 (calc) (ref 1.0–2.5)
ALT: 11 U/L (ref 9–46)
AST: 14 U/L (ref 10–35)
Albumin: 4.4 g/dL (ref 3.6–5.1)
Alkaline phosphatase (APISO): 84 U/L (ref 35–144)
BUN/Creatinine Ratio: 10 (calc) (ref 6–22)
BUN: 15 mg/dL (ref 7–25)
CO2: 29 mmol/L (ref 20–32)
Calcium: 9.5 mg/dL (ref 8.6–10.3)
Chloride: 104 mmol/L (ref 98–110)
Creat: 1.43 mg/dL — ABNORMAL HIGH (ref 0.70–1.35)
Globulin: 3.4 g/dL (ref 1.9–3.7)
Glucose, Bld: 110 mg/dL — ABNORMAL HIGH (ref 65–99)
Potassium: 4.1 mmol/L (ref 3.5–5.3)
Sodium: 142 mmol/L (ref 135–146)
Total Bilirubin: 0.2 mg/dL (ref 0.2–1.2)
Total Protein: 7.8 g/dL (ref 6.1–8.1)
eGFR: 55 mL/min/{1.73_m2} — ABNORMAL LOW (ref >=60)

## 2023-01-30 LAB — LIPID PANEL
Cholesterol: 117 mg/dL (ref <200)
HDL: 32 mg/dL — ABNORMAL LOW (ref >=40)
LDL Cholesterol (Calc): 65 mg/dL
Non-HDL Cholesterol (Calc): 85 mg/dL (ref <130)
Total CHOL/HDL Ratio: 3.7 (calc) (ref <5.0)
Triglycerides: 114 mg/dL (ref <150)

## 2023-01-30 LAB — HEMOGLOBIN A1C
Hgb A1c MFr Bld: 6.5 %{Hb} — ABNORMAL HIGH (ref <5.7)
Mean Plasma Glucose: 140 mg/dL
eAG (mmol/L): 7.7 mmol/L

## 2023-01-30 LAB — RPR: RPR Ser Ql: NONREACTIVE

## 2023-01-30 LAB — HIV-1 RNA QUANT-NO REFLEX-BLD
HIV 1 RNA Quant: NOT DETECTED {copies}/mL
HIV-1 RNA Quant, Log: NOT DETECTED {Log_copies}/mL

## 2023-07-27 ENCOUNTER — Encounter: Payer: Self-pay | Admitting: Infectious Disease

## 2023-07-27 DIAGNOSIS — E785 Hyperlipidemia, unspecified: Secondary | ICD-10-CM

## 2023-07-27 HISTORY — DX: Hyperlipidemia, unspecified: E78.5

## 2023-07-27 NOTE — Progress Notes (Signed)
 Subjective:   Chief complaint: follow-up for HIV disease on medications   Patient ID: Nathan [name], male    DOB: 06-Apr-1961, 63 y.o.   MRN: [id number]  HPI  Discussed the use of AI scribe software for clinical note transcription with the patient, who gave verbal consent to proceed.  History of Present Illness   The patient, with a history of HIV, seizures, and bipolar disorder, presents after a fall at home yesterday. He tripped while walking and fell forward, landing on his front. He reports residual knee pain from the fall. He denies loss of consciousness or other symptoms suggestive of a syncopal episode.  The patient's HIV has been well controlled on Biktarvy , with an undetectable viral load and healthy CD4 count at last check. He also takes Keppra  and valproic  acid for seizure control, and Haldol  and Clozaril  for bipolar disorder. He reports good medication adherence, with assistance in managing his medications in a weekly pill box or bubble packs. He also takes atorvastatin  for hyperlipidemia.       Past Medical History:  Diagnosis Date   Anemia    Anemia    Anxiety    Bipolar 1 disorder (HCC)    Cognitive impairment 09/24/2022   Depression, major    HIV (human immunodeficiency virus infection) (HCC)    HIV disease (HCC) 07/31/2021   Hyperglycemia 08/06/2017   Hyperlipidemia 07/27/2023   Hypernatremia    Kidney failure    Lithium  toxicity    MR (mental retardation)    Schizoaffective disorder (HCC) 08/06/2017   Schizophrenia (HCC)    Seizures (HCC)    Vaccine counseling 04/21/2022    Past Surgical History:  Procedure Laterality Date   BIOPSY N/A 04/12/2014   Procedure: BIOPSY;  Surgeon: Alyce Jubilee, MD;  Location: [facility];  Service: Endoscopy;  Laterality: N/A;  Gastric   COLONOSCOPY WITH PROPOFOL  N/A 04/12/2014   SLF:  1. one colon polyp removed 2. The left colon is extremely redundant 3. Small internal hemorrhoids   ESOPHAGOGASTRODUODENOSCOPY (EGD) WITH  PROPOFOL  N/A 04/12/2014   SLF: Stricture at the gastroesophageal junction 2. small polyp  in teh gastric body 3. Mild Non-erosive gastritis.    NOSE SURGERY  1968   unsure what type of surgery. something to help him breathe better   POLYPECTOMY N/A 04/12/2014   Procedure: POLYPECTOMY;  Surgeon: Alyce Jubilee, MD;  Location: [facility];  Service: Endoscopy;  Laterality: N/A;  Cecal   SAVORY DILATION N/A 04/12/2014   Procedure: SAVORY DILATION;  Surgeon: Alyce Jubilee, MD;  Location: [facility];  Service: Endoscopy;  Laterality: N/A;  12.8/ 14/15/16   SKIN BIOPSY      Family History  Problem Relation Age of Onset   Other Mother        unknown medical history   Other Father        unknown medical history   Colon cancer Neg Hx       Social History   Socioeconomic History   Marital status: Single    Spouse name: Not on file   Number of children: 0   Years of education: 9th grade   Highest education level: Not on file  Occupational History   Occupation: Disabled  Tobacco Use   Smoking status: Never   Smokeless tobacco: Never  Vaping Use   Vaping status: Never Used  Substance and Sexual Activity   Alcohol use: No    Alcohol/week: 0.0 standard drinks of alcohol   Drug use: No  Sexual activity: Never    Birth control/protection: Abstinence    Comment: DECLINED CONDOMS  Other Topics Concern   Not on file  Social History Narrative   Lives in group home.   Right-handed.   2-3 sodas per day.   Social Drivers of Corporate investment banker Strain: Not on file  Food Insecurity: Not on file  Transportation Needs: Not on file  Physical Activity: Not on file  Stress: Not on file  Social Connections: Not on file    No Known Allergies   Current Outpatient Medications:    acetaminophen  (TYLENOL ) 500 MG tablet, Take 1 tablet (500 mg total) by mouth every 8 (eight) hours as needed for moderate pain., Disp: 30 tablet, Rfl: 0   aspirin  EC 81 MG tablet, Take 81 mg by mouth every morning.  Swallow whole., Disp: , Rfl:    atorvastatin  (LIPITOR) 20 MG tablet, Take 1 tablet (20 mg total) by mouth at bedtime., Disp: 30 tablet, Rfl: 11   benztropine  (COGENTIN ) 2 MG tablet, Take 1 tablet (2 mg total) by mouth 2 (two) times daily., Disp: 60 tablet, Rfl: 2   bictegravir-emtricitabine -tenofovir  AF (BIKTARVY ) 50-200-25 MG TABS tablet, Take 1 tablet by mouth daily., Disp: 30 tablet, Rfl: 11   cetirizine  (ZYRTEC ) 10 MG tablet, TAKE 1 TABLET ONCE DAILY. (Patient taking differently: Take 10 mg by mouth every morning.), Disp: 30 tablet, Rfl: 3   cloZAPine  (CLOZARIL ) 100 MG tablet, Take 100 mg by mouth at bedtime., Disp: , Rfl:    clozapine  (CLOZARIL ) 200 MG tablet, Take 200 mg by mouth every morning., Disp: , Rfl:    cloZAPine  (CLOZARIL ) 50 MG tablet, 7/26 75mg  QHS, 7/27 25mg  AM and 100mg  QHS, 7/28 75mg  AM and 100mg  QHS, 7/29 125mg  AM and 100mg  QHS, 7/30 175 mg AM and 100mg  QHS, 7/31 200mg  AM and 100mg  QHS, Disp: 180 tablet, Rfl: 0   divalproex  (DEPAKOTE ) 250 MG DR tablet, Take by mouth., Disp: , Rfl:    famotidine  (PEPCID ) 20 MG tablet, Take 20 mg by mouth 2 (two) times daily., Disp: , Rfl:    fluticasone (FLONASE) 50 MCG/ACT nasal spray, Place 2 sprays into both nostrils every morning., Disp: , Rfl:    folic acid  (FOLVITE ) 1 MG tablet, Take 1 mg by mouth every morning., Disp: , Rfl:    gabapentin  (NEURONTIN ) 300 MG capsule, Take 300 mg by mouth every morning. Take with a 400 mg capsule for a total morning dose of 700 mg, Disp: , Rfl:    gabapentin  (NEURONTIN ) 400 MG capsule, Take 400 mg by mouth 2 (two) times daily. In the morning take with a 300 mg capsule for a total morning dose of 700 mg (Patient not taking: Reported on 01/27/2023), Disp: , Rfl:    haloperidol  (HALDOL ) 5 MG tablet, Take 2 tablets (10 mg total) by mouth daily with supper. (Patient taking differently: Take 10 mg by mouth every morning.), Disp: 60 tablet, Rfl: 2   levETIRAcetam  (KEPPRA ) 750 MG tablet, Take 1 tablet (750 mg total)  by mouth 2 (two) times daily., Disp: 180 tablet, Rfl: 4   metFORMIN  (GLUCOPHAGE ) 500 MG tablet, Take 250 mg by mouth 2 (two) times daily., Disp: , Rfl:    omeprazole (PRILOSEC) 20 MG capsule, Take 20 mg by mouth every morning., Disp: , Rfl:    polyethylene glycol (MIRALAX ) 17 g packet, Take 17 g by mouth daily., Disp: 14 each, Rfl: 0   senna (SENOKOT) 8.6 MG tablet, Take 2 tablets by mouth every  morning. Gerikot, Disp: , Rfl:    traZODone  (DESYREL ) 50 MG tablet, Take 1 tablet (50 mg total) by mouth at bedtime as needed for sleep., Disp: 30 tablet, Rfl: 2   valproic  acid (DEPAKENE ) 250 MG capsule, Take 2 capsules every morning then take 4 capsules at bedtime (Patient not taking: Reported on 01/27/2023), Disp: 180 capsule, Rfl: 2      Review of Systems  Unable to perform ROS: Psychiatric disorder       Objective:   Physical Exam Constitutional:      Appearance: He is well-developed.  HENT:     Head: Normocephalic and atraumatic.  Eyes:     Conjunctiva/sclera: Conjunctivae normal.  Cardiovascular:     Rate and Rhythm: Normal rate and regular rhythm.  Pulmonary:     Effort: Pulmonary effort is normal. No respiratory distress.     Breath sounds: No wheezing.  Abdominal:     General: There is no distension.     Palpations: Abdomen is soft.  Musculoskeletal:        General: No tenderness. Normal range of motion.     Cervical back: Normal range of motion and neck supple.  Skin:    General: Skin is warm and dry.     Coloration: Skin is not pale.     Findings: No erythema or rash.  Neurological:     General: No focal deficit present.     Mental Status: He is alert and oriented to person, place, and time.  Psychiatric:        Attention and Perception: He is inattentive.        Mood and Affect: Mood normal.        Speech: Speech is delayed.        Behavior: Behavior is cooperative.        Thought Content: Thought content normal.        Cognition and Memory: Cognition is impaired.         Judgment: Judgment normal.           Assessment & Plan:   Assessment and Plan    HIV infection HIV well-controlled with undetectable viral load and healthy CD4 count on Biktarvy . - Order HIV viral load, CD4 count, and kidney function tests.  Seizure disorder Seizure disorder stable on Keppra  and divalproex  sodium. - Continue current medication regimen.  Bipolar disorder Bipolar disorder stable on Haldol  and Clozaril . - Continue current medication regimen.  Hyperlipidemia Hyperlipidemia managed with atorvastatin . - Continue atorvastatin .

## 2023-07-28 ENCOUNTER — Encounter: Payer: Self-pay | Admitting: Infectious Disease

## 2023-07-28 ENCOUNTER — Ambulatory Visit (INDEPENDENT_AMBULATORY_CARE_PROVIDER_SITE_OTHER): Payer: 59 | Admitting: Infectious Disease

## 2023-07-28 ENCOUNTER — Other Ambulatory Visit: Payer: Self-pay

## 2023-07-28 VITALS — BP 120/74 | HR 95 | Resp 15 | Ht 69.0 in | Wt 163.4 lb

## 2023-07-28 DIAGNOSIS — Z7185 Encounter for immunization safety counseling: Secondary | ICD-10-CM

## 2023-07-28 DIAGNOSIS — F319 Bipolar disorder, unspecified: Secondary | ICD-10-CM

## 2023-07-28 DIAGNOSIS — B20 Human immunodeficiency virus [HIV] disease: Secondary | ICD-10-CM

## 2023-07-28 DIAGNOSIS — E785 Hyperlipidemia, unspecified: Secondary | ICD-10-CM | POA: Diagnosis not present

## 2023-07-28 DIAGNOSIS — N1831 Chronic kidney disease, stage 3a: Secondary | ICD-10-CM

## 2023-07-28 MED ORDER — ATORVASTATIN CALCIUM 20 MG PO TABS: 20.0000 mg | ORAL_TABLET | Freq: Every day | ORAL | 11 refills | Status: DC

## 2023-07-28 MED ORDER — BIKTARVY 50-200-25 MG PO TABS: 1.0000 | ORAL_TABLET | Freq: Every day | ORAL | 11 refills | Status: DC

## 2023-07-29 LAB — C. TRACHOMATIS/N. GONORRHOEAE RNA
C. trachomatis RNA, TMA: NOT DETECTED
N. gonorrhoeae RNA, TMA: NOT DETECTED

## 2023-07-31 LAB — HIV-1 RNA QUANT-NO REFLEX-BLD
HIV 1 RNA Quant: NOT DETECTED {copies}/mL
HIV-1 RNA Quant, Log: NOT DETECTED {Log_copies}/mL

## 2023-07-31 LAB — HEMOGLOBIN A1C
Hgb A1c MFr Bld: 6 % — ABNORMAL HIGH (ref <5.7)
Mean Plasma Glucose: 126 mg/dL
eAG (mmol/L): 7 mmol/L

## 2023-07-31 LAB — CBC WITH DIFFERENTIAL/PLATELET
Absolute Lymphocytes: 2070 {cells}/uL (ref 850–3900)
Absolute Monocytes: 580 {cells}/uL (ref 200–950)
Basophils Absolute: 21 {cells}/uL (ref 0–200)
Basophils Relative: 0.3 %
Eosinophils Absolute: 421 {cells}/uL (ref 15–500)
Eosinophils Relative: 6.1 %
HCT: 36.8 % — ABNORMAL LOW (ref 38.5–50.0)
Hemoglobin: 11.9 g/dL — ABNORMAL LOW (ref 13.2–17.1)
MCH: 29.4 pg (ref 27.0–33.0)
MCHC: 32.3 g/dL (ref 32.0–36.0)
MCV: 90.9 fL (ref 80.0–100.0)
MPV: 12.9 fL — ABNORMAL HIGH (ref 7.5–12.5)
Monocytes Relative: 8.4 %
Neutro Abs: 3809 {cells}/uL (ref 1500–7800)
Neutrophils Relative %: 55.2 %
Platelets: 138 10*3/uL — ABNORMAL LOW (ref 140–400)
RBC: 4.05 10*6/uL — ABNORMAL LOW (ref 4.20–5.80)
RDW: 13.6 % (ref 11.0–15.0)
Total Lymphocyte: 30 %
WBC: 6.9 10*3/uL (ref 3.8–10.8)

## 2023-07-31 LAB — COMPLETE METABOLIC PANEL WITHOUT GFR
AG Ratio: 1.4 (calc) (ref 1.0–2.5)
ALT: 13 U/L (ref 9–46)
AST: 14 U/L (ref 10–35)
Albumin: 4.3 g/dL (ref 3.6–5.1)
Alkaline phosphatase (APISO): 80 U/L (ref 35–144)
BUN/Creatinine Ratio: 12 (calc) (ref 6–22)
BUN: 17 mg/dL (ref 7–25)
CO2: 30 mmol/L (ref 20–32)
Calcium: 9.4 mg/dL (ref 8.6–10.3)
Chloride: 103 mmol/L (ref 98–110)
Creat: 1.37 mg/dL — ABNORMAL HIGH (ref 0.70–1.35)
Globulin: 3.1 g/dL (ref 1.9–3.7)
Glucose, Bld: 135 mg/dL — ABNORMAL HIGH (ref 65–99)
Potassium: 4.4 mmol/L (ref 3.5–5.3)
Sodium: 140 mmol/L (ref 135–146)
Total Bilirubin: 0.3 mg/dL (ref 0.2–1.2)
Total Protein: 7.4 g/dL (ref 6.1–8.1)

## 2023-07-31 LAB — LIPID PANEL
Cholesterol: 114 mg/dL (ref <200)
HDL: 31 mg/dL — ABNORMAL LOW (ref >=40)
LDL Cholesterol (Calc): 63 mg/dL
Non-HDL Cholesterol (Calc): 83 mg/dL (ref <130)
Total CHOL/HDL Ratio: 3.7 (calc) (ref <5.0)
Triglycerides: 110 mg/dL (ref <150)

## 2023-07-31 LAB — T-HELPER CELLS (CD4) COUNT (NOT AT ARMC)
Absolute CD4: 640 {cells}/uL (ref 490–1740)
CD4 T Helper %: 30 % (ref 30–61)
Total lymphocyte count: 2161 {cells}/uL (ref 850–3900)

## 2023-07-31 LAB — RPR: RPR Ser Ql: NONREACTIVE

## 2023-08-06 NOTE — Progress Notes (Signed)
 The ASCVD Risk score (Arnett DK, et al., 2019) failed to calculate for the following reasons:   The valid total cholesterol range is 130 to 320 mg/dL  Nathan [name], BSN, RN

## 2023-09-24 ENCOUNTER — Ambulatory Visit: Admitting: Urology

## 2023-09-24 ENCOUNTER — Encounter: Payer: Self-pay | Admitting: Urology

## 2023-09-24 VITALS — BP 117/75 | HR 94 | Ht 69.0 in | Wt 141.0 lb

## 2023-09-24 DIAGNOSIS — R399 Unspecified symptoms and signs involving the genitourinary system: Secondary | ICD-10-CM

## 2023-09-24 LAB — URINALYSIS, COMPLETE
Bilirubin, UA: NEGATIVE
Glucose, UA: NEGATIVE
Ketones, UA: NEGATIVE
Leukocytes,UA: NEGATIVE
Nitrite, UA: NEGATIVE
Protein,UA: NEGATIVE
RBC, UA: NEGATIVE
Specific Gravity, UA: 1.01 (ref 1.005–1.030)
Urobilinogen, Ur: 1 mg/dL (ref 0.2–1.0)
pH, UA: 6.5 (ref 5.0–7.5)

## 2023-09-24 LAB — MICROSCOPIC EXAMINATION: Bacteria, UA: NONE SEEN

## 2023-09-24 LAB — BLADDER SCAN AMB NON-IMAGING

## 2023-09-24 NOTE — Progress Notes (Signed)
 I, Maysun LITTIE Griffiths, acting as a scribe for Glendia JAYSON Barba, MD., have documented all relevant documentation on the behalf of Glendia JAYSON Barba, MD, as directed by Glendia JAYSON Barba, MD while in the presence of Glendia JAYSON Barba, MD.  09/24/2023 3:30 PM   Nathan [name] 1960/11/28 [id number]  Referring provider: Barbaraann Harvey, FNP [address] [place],  [place] [address]  Chief Complaint  Patient presents with   Other    Difficulty urinating    HPI: Nathan [name] is a 63 y.o. male referred for evaluation of urinary difficulty.   Resides in a group home with a history of cognitive impairment and bipolar disorder. HIV disease followed by infectious disease.  He is a ward of the [facility] Several month history of urinary hesitancy, straining to urinate, intermittent urinary stream. IPSS today 14/35  On tamsulosin  0.4 mg daily without improvement in symptoms.  RUS perform 09/02/2023 showed no abnormalities.  PSA 04/28/2023 was 0.546 He has had 2 positive urine cultures since January.   PMH: Past Medical History:  Diagnosis Date   Anemia    Anemia    Anxiety    Bipolar 1 disorder (HCC)    Cognitive impairment 09/24/2022   Depression, major    HIV (human immunodeficiency virus infection) (HCC)    HIV disease (HCC) 07/31/2021   Hyperglycemia 08/06/2017   Hyperlipidemia 07/27/2023   Hypernatremia    Kidney failure    Lithium  toxicity    MR (mental retardation)    Schizoaffective disorder (HCC) 08/06/2017   Schizophrenia (HCC)    Seizures (HCC)    Vaccine counseling 04/21/2022    Surgical History: Past Surgical History:  Procedure Laterality Date   BIOPSY N/A 04/12/2014   Procedure: BIOPSY;  Surgeon: Margo LITTIE Haddock, MD;  Location: [facility];  Service: Endoscopy;  Laterality: N/A;  Gastric   COLONOSCOPY WITH PROPOFOL  N/A 04/12/2014   SLF:  1. one colon polyp removed 2. The left colon is extremely redundant 3. Small internal  hemorrhoids   ESOPHAGOGASTRODUODENOSCOPY (EGD) WITH PROPOFOL  N/A 04/12/2014   SLF: Stricture at the gastroesophageal junction 2. small polyp  in teh gastric body 3. Mild Non-erosive gastritis.    NOSE SURGERY  1968   unsure what type of surgery. something to help him breathe better   POLYPECTOMY N/A 04/12/2014   Procedure: POLYPECTOMY;  Surgeon: Margo LITTIE Haddock, MD;  Location: [facility];  Service: Endoscopy;  Laterality: N/A;  Cecal   SAVORY DILATION N/A 04/12/2014   Procedure: SAVORY DILATION;  Surgeon: Margo LITTIE Haddock, MD;  Location: [facility];  Service: Endoscopy;  Laterality: N/A;  12.8/ 14/15/16   SKIN BIOPSY      Home Medications:  Allergies as of 09/24/2023   No Known Allergies      Medication List        Accurate as of September 24, 2023  3:30 PM. If you have any questions, ask your nurse or doctor.          acetaminophen  500 MG tablet Commonly known as: TYLENOL  Take 1 tablet (500 mg total) by mouth every 8 (eight) hours as needed for moderate pain.   aspirin  EC 81 MG tablet Take 81 mg by mouth every morning. Swallow whole.   atorvastatin  20 MG tablet Commonly known as: LIPITOR Take 1 tablet (20 mg total) by mouth at bedtime.   benztropine  2 MG tablet Commonly known as: COGENTIN  Take 1 tablet (2 mg total) by  mouth 2 (two) times daily.   Biktarvy  50-200-25 MG Tabs tablet Generic drug: bictegravir-emtricitabine -tenofovir  AF Take 1 tablet by mouth daily.   cetirizine  10 MG tablet Commonly known as: ZYRTEC  TAKE 1 TABLET ONCE DAILY. What changed: when to take this   clozapine  50 MG tablet Commonly known as: CLOZARIL  7/26 75mg  QHS, 7/27 25mg  AM and 100mg  QHS, 7/28 75mg  AM and 100mg  QHS, 7/29 125mg  AM and 100mg  QHS, 7/30 175 mg AM and 100mg  QHS, 7/31 200mg  AM and 100mg  QHS   cloZAPine  100 MG tablet Commonly known as: CLOZARIL  Take 100 mg by mouth at bedtime.   clozapine  200 MG tablet Commonly known as: CLOZARIL  Take 200 mg by mouth every morning.   divalproex  250 MG DR  tablet Commonly known as: DEPAKOTE  Take by mouth.   famotidine  20 MG tablet Commonly known as: PEPCID  Take 20 mg by mouth 2 (two) times daily.   fluticasone 50 MCG/ACT nasal spray Commonly known as: FLONASE Place 2 sprays into both nostrils every morning.   folic acid  1 MG tablet Commonly known as: FOLVITE  Take 1 mg by mouth every morning.   gabapentin  400 MG capsule Commonly known as: NEURONTIN  Take 400 mg by mouth 2 (two) times daily. In the morning take with a 300 mg capsule for a total morning dose of 700 mg   gabapentin  300 MG capsule Commonly known as: NEURONTIN  Take 300 mg by mouth every morning. Take with a 400 mg capsule for a total morning dose of 700 mg   haloperidol  5 MG tablet Commonly known as: HALDOL  Take 2 tablets (10 mg total) by mouth daily with supper. What changed: when to take this   latanoprost 0.005 % ophthalmic solution Commonly known as: XALATAN SMARTSIG:In Eye(s)   levETIRAcetam  750 MG tablet Commonly known as: KEPPRA  Take 1 tablet (750 mg total) by mouth 2 (two) times daily.   metFORMIN  500 MG tablet Commonly known as: GLUCOPHAGE  Take 250 mg by mouth 2 (two) times daily.   ofloxacin 0.3 % ophthalmic solution Commonly known as: OCUFLOX Apply 2 drops to eye.   omeprazole 20 MG capsule Commonly known as: PRILOSEC Take 20 mg by mouth every morning.   polyethylene glycol 17 g packet Commonly known as: MiraLax  Take 17 g by mouth daily.   senna 8.6 MG tablet Commonly known as: SENOKOT Take 2 tablets by mouth every morning. Gerikot   tamsulosin  0.4 MG Caps capsule Commonly known as: FLOMAX  Take by mouth.   traZODone  50 MG tablet Commonly known as: DESYREL  Take 1 tablet (50 mg total) by mouth at bedtime as needed for sleep.   valproic  acid 250 MG capsule Commonly known as: DEPAKENE  Take 2 capsules every morning then take 4 capsules at bedtime        Allergies: No Known Allergies  Family History: Family History  Problem  Relation Age of Onset   Other Mother        unknown medical history   Other Father        unknown medical history   Colon cancer Neg Hx     Social History:  reports that he has never smoked. He has never used smokeless tobacco. He reports that he does not drink alcohol and does not use drugs.   Physical Exam: BP 117/75   Pulse 94   Ht 5' 9 (1.753 m)   Wt 141 lb (64 kg)   BMI 20.82 kg/m   Constitutional:  Alert, No acute distress. HEENT: [place] AT Respiratory: Normal respiratory  effort, no increased work of breathing.  Urinalysis Dipstick/microscopy negative.   Assessment & Plan:    1. Lower urinary tract symptoms Primarily obstructive voiding symptoms. PVR today 0 mL Titrate tamsulosin  0.4 mg BID.  Cystoscopy if no improvement in symptoms to evaluate for the possibility of a urethral stricture disease.  I have reviewed the above documentation for accuracy and completeness, and I agree with the above.   Glendia JAYSON Barba, MD  [facility] [address], [address] [place], [place] [address] [phone]

## 2023-10-20 ENCOUNTER — Ambulatory Visit: Payer: Self-pay | Admitting: Infectious Disease

## 2023-10-20 DIAGNOSIS — R4189 Other symptoms and signs involving cognitive functions and awareness: Secondary | ICD-10-CM

## 2023-10-20 DIAGNOSIS — B20 Human immunodeficiency virus [HIV] disease: Secondary | ICD-10-CM

## 2023-10-20 DIAGNOSIS — R569 Unspecified convulsions: Secondary | ICD-10-CM

## 2023-10-20 DIAGNOSIS — F319 Bipolar disorder, unspecified: Secondary | ICD-10-CM

## 2023-11-16 NOTE — Progress Notes (Unsigned)
 Subjective:  Chief complaint: follow-up for HIV disease on medications   Patient ID: Nathan [name], male    DOB: 1960-06-20, 63 y.o.   MRN: [id number]  HPI  Discussed the use of AI scribe software for clinical note transcription with the patient, who gave verbal consent to proceed.  History of Present Illness   Nathan [name] is a 63 year old male with HIV who presents for routine follow-up.  He is currently on Biktarvy  for HIV management, and his viral load was undetectable as of April 2025.  He takes Keppra  and valproic  acid for seizure control, and Haldol  and Clozaril  for bipolar disorder. Additionally, he is on Lipitor for cholesterol management.  In April 2025, blood work indicated prediabetes, although other lab results, including cholesterol levels, were satisfactory.  He uses [facility] in [place] for his medication needs.        Past Medical History:  Diagnosis Date   Anemia    Anemia    Anxiety    Bipolar 1 disorder (HCC)    Cognitive impairment 09/24/2022   Depression, major    HIV (human immunodeficiency virus infection) (HCC)    HIV disease (HCC) 07/31/2021   Hyperglycemia 08/06/2017   Hyperlipidemia 07/27/2023   Hypernatremia    Kidney failure    Lithium  toxicity    MR (mental retardation)    Schizoaffective disorder (HCC) 08/06/2017   Schizophrenia (HCC)    Seizures (HCC)    Vaccine counseling 04/21/2022    Past Surgical History:  Procedure Laterality Date   BIOPSY N/A 04/12/2014   Procedure: BIOPSY;  Surgeon: Margo LITTIE Haddock, MD;  Location: [facility];  Service: Endoscopy;  Laterality: N/A;  Gastric   COLONOSCOPY WITH PROPOFOL  N/A 04/12/2014   SLF:  1. one colon polyp removed 2. The left colon is extremely redundant 3. Small internal hemorrhoids   ESOPHAGOGASTRODUODENOSCOPY (EGD) WITH PROPOFOL  N/A 04/12/2014   SLF: Stricture at the gastroesophageal junction 2. small polyp  in teh gastric body 3. Mild Non-erosive gastritis.    NOSE SURGERY   1968   unsure what type of surgery. something to help him breathe better   POLYPECTOMY N/A 04/12/2014   Procedure: POLYPECTOMY;  Surgeon: Margo LITTIE Haddock, MD;  Location: [facility];  Service: Endoscopy;  Laterality: N/A;  Cecal   SAVORY DILATION N/A 04/12/2014   Procedure: SAVORY DILATION;  Surgeon: Margo LITTIE Haddock, MD;  Location: [facility];  Service: Endoscopy;  Laterality: N/A;  12.8/ 14/15/16   SKIN BIOPSY      Family History  Problem Relation Age of Onset   Other Mother        unknown medical history   Other Father        unknown medical history   Colon cancer Neg Hx       Social History   Socioeconomic History   Marital status: Single    Spouse name: Not on file   Number of children: 0   Years of education: 9th grade   Highest education level: Not on file  Occupational History   Occupation: Disabled  Tobacco Use   Smoking status: Never   Smokeless tobacco: Never  Vaping Use   Vaping status: Never Used  Substance and Sexual Activity   Alcohol use: No    Alcohol/week: 0.0 standard drinks of alcohol   Drug use: No   Sexual activity: Never    Birth control/protection: Abstinence    Comment: DECLINED CONDOMS  Other Topics Concern   Not on  file  Social History Narrative   Lives in group home.   Right-handed.   2-3 sodas per day.   Social Drivers of Corporate investment banker Strain: Not on file  Food Insecurity: Not on file  Transportation Needs: Not on file  Physical Activity: Not on file  Stress: Not on file  Social Connections: Not on file    No Known Allergies   Current Outpatient Medications:    acetaminophen  (TYLENOL ) 500 MG tablet, Take 1 tablet (500 mg total) by mouth every 8 (eight) hours as needed for moderate pain., Disp: 30 tablet, Rfl: 0   aspirin  EC 81 MG tablet, Take 81 mg by mouth every morning. Swallow whole., Disp: , Rfl:    atorvastatin  (LIPITOR) 20 MG tablet, Take 1 tablet (20 mg total) by mouth at bedtime., Disp: 30 tablet, Rfl: 11    benztropine  (COGENTIN ) 2 MG tablet, Take 1 tablet (2 mg total) by mouth 2 (two) times daily., Disp: 60 tablet, Rfl: 2   bictegravir-emtricitabine -tenofovir  AF (BIKTARVY ) 50-200-25 MG TABS tablet, Take 1 tablet by mouth daily., Disp: 30 tablet, Rfl: 11   cetirizine  (ZYRTEC ) 10 MG tablet, TAKE 1 TABLET ONCE DAILY. (Patient taking differently: Take 10 mg by mouth every morning.), Disp: 30 tablet, Rfl: 3   cloZAPine  (CLOZARIL ) 100 MG tablet, Take 100 mg by mouth at bedtime., Disp: , Rfl:    clozapine  (CLOZARIL ) 200 MG tablet, Take 200 mg by mouth every morning., Disp: , Rfl:    cloZAPine  (CLOZARIL ) 50 MG tablet, 7/26 75mg  QHS, 7/27 25mg  AM and 100mg  QHS, 7/28 75mg  AM and 100mg  QHS, 7/29 125mg  AM and 100mg  QHS, 7/30 175 mg AM and 100mg  QHS, 7/31 200mg  AM and 100mg  QHS, Disp: 180 tablet, Rfl: 0   divalproex  (DEPAKOTE ) 250 MG DR tablet, Take by mouth., Disp: , Rfl:    famotidine  (PEPCID ) 20 MG tablet, Take 20 mg by mouth 2 (two) times daily., Disp: , Rfl:    fluticasone (FLONASE) 50 MCG/ACT nasal spray, Place 2 sprays into both nostrils every morning., Disp: , Rfl:    folic acid  (FOLVITE ) 1 MG tablet, Take 1 mg by mouth every morning., Disp: , Rfl:    gabapentin  (NEURONTIN ) 300 MG capsule, Take 300 mg by mouth every morning. Take with a 400 mg capsule for a total morning dose of 700 mg, Disp: , Rfl:    gabapentin  (NEURONTIN ) 400 MG capsule, Take 400 mg by mouth 2 (two) times daily. In the morning take with a 300 mg capsule for a total morning dose of 700 mg, Disp: , Rfl:    haloperidol  (HALDOL ) 5 MG tablet, Take 2 tablets (10 mg total) by mouth daily with supper. (Patient taking differently: Take 10 mg by mouth every morning.), Disp: 60 tablet, Rfl: 2   latanoprost (XALATAN) 0.005 % ophthalmic solution, SMARTSIG:In Eye(s), Disp: , Rfl:    levETIRAcetam  (KEPPRA ) 750 MG tablet, Take 1 tablet (750 mg total) by mouth 2 (two) times daily., Disp: 180 tablet, Rfl: 4   metFORMIN  (GLUCOPHAGE ) 500 MG tablet, Take 250  mg by mouth 2 (two) times daily., Disp: , Rfl:    ofloxacin (OCUFLOX) 0.3 % ophthalmic solution, Apply 2 drops to eye., Disp: , Rfl:    omeprazole (PRILOSEC) 20 MG capsule, Take 20 mg by mouth every morning., Disp: , Rfl:    polyethylene glycol (MIRALAX ) 17 g packet, Take 17 g by mouth daily., Disp: 14 each, Rfl: 0   senna (SENOKOT) 8.6 MG tablet, Take 2 tablets by  mouth every morning. Gerikot, Disp: , Rfl:    tamsulosin  (FLOMAX ) 0.4 MG CAPS capsule, Take by mouth., Disp: , Rfl:    traZODone  (DESYREL ) 50 MG tablet, Take 1 tablet (50 mg total) by mouth at bedtime as needed for sleep., Disp: 30 tablet, Rfl: 2   valproic  acid (DEPAKENE ) 250 MG capsule, Take 2 capsules every morning then take 4 capsules at bedtime, Disp: 180 capsule, Rfl: 2   Review of Systems  Unable to perform ROS: Psychiatric disorder       Objective:   Physical Exam Constitutional:      Appearance: He is well-developed.  HENT:     Head: Normocephalic and atraumatic.  Eyes:     Conjunctiva/sclera: Conjunctivae normal.  Cardiovascular:     Rate and Rhythm: Normal rate and regular rhythm.  Pulmonary:     Effort: Pulmonary effort is normal. No respiratory distress.     Breath sounds: No wheezing.  Abdominal:     General: There is no distension.     Palpations: Abdomen is soft.  Musculoskeletal:        General: No tenderness. Normal range of motion.     Cervical back: Normal range of motion and neck supple.  Skin:    General: Skin is warm and dry.     Coloration: Skin is not pale.     Findings: No erythema or rash.  Neurological:     General: No focal deficit present.     Mental Status: He is alert and oriented to person, place, and time.  Psychiatric:        Mood and Affect: Mood normal.        Speech: Speech is delayed.        Behavior: Behavior normal.        Thought Content: Thought content normal.        Cognition and Memory: Cognition is impaired. He exhibits impaired remote memory.            Assessment & Plan:   Assessment and Plan    Human immunodeficiency virus (HIV) infection HIV infection well-controlled with Biktarvy . Viral load undetectable. - Send Biktarvy  prescription to [facility] in [place]. - Repeat blood work to monitor HIV status, HIV RNA, CD4 and routine labs  Hyperlipidemia Hyperlipidemia well-managed with Lipitor. Cholesterol levels within target range. - Repeat blood work to monitor cholesterol levels. --continue lipitor  Prediabetes Prediabetes identified in previous lab work. - Repeat blood work to monitor glucose levels.   Bipolar disorder: on clozarili and haldol   Cognitive impairment: lives in assisted living facility

## 2023-11-17 ENCOUNTER — Other Ambulatory Visit: Payer: Self-pay

## 2023-11-17 ENCOUNTER — Ambulatory Visit (INDEPENDENT_AMBULATORY_CARE_PROVIDER_SITE_OTHER): Admitting: Infectious Disease

## 2023-11-17 ENCOUNTER — Other Ambulatory Visit (HOSPITAL_COMMUNITY)
Admission: RE | Admit: 2023-11-17 | Discharge: 2023-11-17 | Disposition: A | Discharge status: Home / Self Care | Source: Ambulatory Visit | Attending: Infectious Disease | Admitting: Infectious Disease

## 2023-11-17 VITALS — Ht 69.0 in | Wt 162.0 lb

## 2023-11-17 DIAGNOSIS — E785 Hyperlipidemia, unspecified: Secondary | ICD-10-CM | POA: Diagnosis not present

## 2023-11-17 DIAGNOSIS — F319 Bipolar disorder, unspecified: Secondary | ICD-10-CM

## 2023-11-17 DIAGNOSIS — R569 Unspecified convulsions: Secondary | ICD-10-CM

## 2023-11-17 DIAGNOSIS — R4189 Other symptoms and signs involving cognitive functions and awareness: Secondary | ICD-10-CM

## 2023-11-17 DIAGNOSIS — B20 Human immunodeficiency virus [HIV] disease: Secondary | ICD-10-CM | POA: Diagnosis present

## 2023-11-17 MED ORDER — ATORVASTATIN CALCIUM 20 MG PO TABS: 20.0000 mg | ORAL_TABLET | Freq: Every day | ORAL | 11 refills | Status: AC

## 2023-11-17 MED ORDER — BIKTARVY 50-200-25 MG PO TABS: 1.0000 | ORAL_TABLET | Freq: Every day | ORAL | 11 refills | Status: AC

## 2023-11-18 LAB — T-HELPER CELLS (CD4) COUNT (NOT AT ARMC)
CD4 % Helper T Cell: 29 % — ABNORMAL LOW (ref 33–65)
CD4 T Cell Abs: 598 /uL (ref 400–1790)

## 2023-11-18 LAB — URINE CYTOLOGY ANCILLARY ONLY
Chlamydia: NEGATIVE
Comment: NEGATIVE
Comment: NORMAL
Neisseria Gonorrhea: NEGATIVE

## 2023-11-19 LAB — CBC WITH DIFFERENTIAL/PLATELET
Absolute Lymphocytes: 2172 {cells}/uL (ref 850–3900)
Absolute Monocytes: 837 {cells}/uL (ref 200–950)
Basophils Absolute: 36 {cells}/uL (ref 0–200)
Basophils Relative: 0.4 %
Eosinophils Absolute: 579 {cells}/uL — ABNORMAL HIGH (ref 15–500)
Eosinophils Relative: 6.5 %
HCT: 34.3 % — ABNORMAL LOW (ref 38.5–50.0)
Hemoglobin: 11 g/dL — ABNORMAL LOW (ref 13.2–17.1)
MCH: 29.1 pg (ref 27.0–33.0)
MCHC: 32.1 g/dL (ref 32.0–36.0)
MCV: 90.7 fL (ref 80.0–100.0)
MPV: 12.6 fL — ABNORMAL HIGH (ref 7.5–12.5)
Monocytes Relative: 9.4 %
Neutro Abs: 5278 {cells}/uL (ref 1500–7800)
Neutrophils Relative %: 59.3 %
Platelets: 143 Thousand/uL (ref 140–400)
RBC: 3.78 Million/uL — ABNORMAL LOW (ref 4.20–5.80)
RDW: 13.2 % (ref 11.0–15.0)
Total Lymphocyte: 24.4 %
WBC: 8.9 Thousand/uL (ref 3.8–10.8)

## 2023-11-19 LAB — LIPID PANEL
Cholesterol: 101 mg/dL (ref <200)
HDL: 32 mg/dL — ABNORMAL LOW (ref >=40)
LDL Cholesterol (Calc): 53 mg/dL
Non-HDL Cholesterol (Calc): 69 mg/dL (ref <130)
Total CHOL/HDL Ratio: 3.2 (calc) (ref <5.0)
Triglycerides: 77 mg/dL (ref <150)

## 2023-11-19 LAB — COMPLETE METABOLIC PANEL WITHOUT GFR
AG Ratio: 1.7 (calc) (ref 1.0–2.5)
ALT: 18 U/L (ref 9–46)
AST: 15 U/L (ref 10–35)
Albumin: 4.5 g/dL (ref 3.6–5.1)
Alkaline phosphatase (APISO): 84 U/L (ref 35–144)
BUN/Creatinine Ratio: 12 (calc) (ref 6–22)
BUN: 17 mg/dL (ref 7–25)
CO2: 29 mmol/L (ref 20–32)
Calcium: 9.7 mg/dL (ref 8.6–10.3)
Chloride: 104 mmol/L (ref 98–110)
Creat: 1.39 mg/dL — ABNORMAL HIGH (ref 0.70–1.35)
Globulin: 2.6 g/dL (ref 1.9–3.7)
Glucose, Bld: 97 mg/dL (ref 65–99)
Potassium: 4.3 mmol/L (ref 3.5–5.3)
Sodium: 141 mmol/L (ref 135–146)
Total Bilirubin: 0.2 mg/dL (ref 0.2–1.2)
Total Protein: 7.1 g/dL (ref 6.1–8.1)

## 2023-11-19 LAB — HIV-1 RNA QUANT-NO REFLEX-BLD
HIV 1 RNA Quant: NOT DETECTED {copies}/mL
HIV-1 RNA Quant, Log: NOT DETECTED {Log_copies}/mL

## 2023-11-19 LAB — RPR: RPR Ser Ql: NONREACTIVE

## 2024-03-10 ENCOUNTER — Encounter (INDEPENDENT_AMBULATORY_CARE_PROVIDER_SITE_OTHER): Payer: Self-pay | Admitting: *Deleted

## 2024-05-19 ENCOUNTER — Ambulatory Visit: Admitting: Infectious Disease
